# Patient Record
Sex: Female | Born: 1950 | Race: Black or African American | Hispanic: No | Marital: Married | Smoking: Current every day smoker
Health system: Southern US, Community
[De-identification: ages and names within clinical notes are randomized; demographics above are authoritative.]

## PROBLEM LIST (undated history)

## (undated) DIAGNOSIS — M1990 Unspecified osteoarthritis, unspecified site: Secondary | ICD-10-CM

## (undated) DIAGNOSIS — Z720 Tobacco use: Secondary | ICD-10-CM

## (undated) DIAGNOSIS — IMO0002 Reserved for concepts with insufficient information to code with codable children: Secondary | ICD-10-CM

## (undated) DIAGNOSIS — F419 Anxiety disorder, unspecified: Secondary | ICD-10-CM

## (undated) DIAGNOSIS — D573 Sickle-cell trait: Secondary | ICD-10-CM

## (undated) DIAGNOSIS — T7840XA Allergy, unspecified, initial encounter: Secondary | ICD-10-CM

## (undated) DIAGNOSIS — I639 Cerebral infarction, unspecified: Secondary | ICD-10-CM

## (undated) DIAGNOSIS — I2510 Atherosclerotic heart disease of native coronary artery without angina pectoris: Secondary | ICD-10-CM

## (undated) DIAGNOSIS — M810 Age-related osteoporosis without current pathological fracture: Secondary | ICD-10-CM

## (undated) DIAGNOSIS — I219 Acute myocardial infarction, unspecified: Secondary | ICD-10-CM

## (undated) DIAGNOSIS — I10 Essential (primary) hypertension: Secondary | ICD-10-CM

## (undated) DIAGNOSIS — E785 Hyperlipidemia, unspecified: Secondary | ICD-10-CM

## (undated) HISTORY — DX: Hyperlipidemia, unspecified: E78.5

## (undated) HISTORY — DX: Cerebral infarction, unspecified: I63.9

## (undated) HISTORY — PX: POLYPECTOMY: SHX149

## (undated) HISTORY — DX: Atherosclerotic heart disease of native coronary artery without angina pectoris: I25.10

## (undated) HISTORY — DX: Age-related osteoporosis without current pathological fracture: M81.0

## (undated) HISTORY — DX: Essential (primary) hypertension: I10

## (undated) HISTORY — DX: Acute myocardial infarction, unspecified: I21.9

## (undated) HISTORY — DX: Tobacco use: Z72.0

## (undated) HISTORY — PX: DILATION AND CURETTAGE OF UTERUS: SHX78

## (undated) HISTORY — DX: Unspecified osteoarthritis, unspecified site: M19.90

## (undated) HISTORY — DX: Sickle-cell trait: D57.3

## (undated) HISTORY — DX: Allergy, unspecified, initial encounter: T78.40XA

## (undated) HISTORY — DX: Anxiety disorder, unspecified: F41.9

## (undated) HISTORY — DX: Reserved for concepts with insufficient information to code with codable children: IMO0002

---

## 1998-09-23 HISTORY — PX: FOOT SURGERY: SHX648

## 1998-09-30 ENCOUNTER — Emergency Department (HOSPITAL_COMMUNITY): Admission: EM | Admit: 1998-09-30 | Discharge: 1998-09-30 | Payer: Self-pay

## 1999-11-13 ENCOUNTER — Encounter: Payer: Self-pay | Admitting: Emergency Medicine

## 1999-11-13 ENCOUNTER — Inpatient Hospital Stay (HOSPITAL_COMMUNITY): Admission: EM | Admit: 1999-11-13 | Discharge: 1999-11-16 | Payer: Self-pay | Admitting: Emergency Medicine

## 2000-02-10 ENCOUNTER — Inpatient Hospital Stay (HOSPITAL_COMMUNITY): Admission: EM | Admit: 2000-02-10 | Discharge: 2000-02-12 | Payer: Self-pay | Admitting: Emergency Medicine

## 2000-02-10 ENCOUNTER — Encounter: Payer: Self-pay | Admitting: Emergency Medicine

## 2000-07-22 ENCOUNTER — Emergency Department (HOSPITAL_COMMUNITY): Admission: EM | Admit: 2000-07-22 | Discharge: 2000-07-22 | Payer: Self-pay | Admitting: Emergency Medicine

## 2000-07-22 ENCOUNTER — Encounter: Payer: Self-pay | Admitting: Emergency Medicine

## 2000-07-23 ENCOUNTER — Inpatient Hospital Stay (HOSPITAL_COMMUNITY): Admission: EM | Admit: 2000-07-23 | Discharge: 2000-07-26 | Payer: Self-pay | Admitting: *Deleted

## 2000-07-23 ENCOUNTER — Encounter: Payer: Self-pay | Admitting: *Deleted

## 2000-07-24 ENCOUNTER — Encounter: Payer: Self-pay | Admitting: *Deleted

## 2000-08-05 ENCOUNTER — Ambulatory Visit (HOSPITAL_COMMUNITY): Admission: RE | Admit: 2000-08-05 | Discharge: 2000-08-05 | Payer: Self-pay | Admitting: Urology

## 2004-09-23 HISTORY — PX: ANGIOPLASTY: SHX39

## 2005-05-21 ENCOUNTER — Ambulatory Visit: Payer: Self-pay | Admitting: *Deleted

## 2005-05-21 ENCOUNTER — Inpatient Hospital Stay (HOSPITAL_COMMUNITY): Admission: EM | Admit: 2005-05-21 | Discharge: 2005-05-27 | Payer: Self-pay | Admitting: Emergency Medicine

## 2005-06-07 ENCOUNTER — Ambulatory Visit: Payer: Self-pay | Admitting: Cardiology

## 2005-07-02 ENCOUNTER — Ambulatory Visit: Payer: Self-pay | Admitting: Cardiology

## 2005-07-11 ENCOUNTER — Encounter (HOSPITAL_COMMUNITY): Admission: RE | Admit: 2005-07-11 | Discharge: 2005-10-09 | Payer: Self-pay | Admitting: Cardiology

## 2005-07-19 ENCOUNTER — Ambulatory Visit: Payer: Self-pay | Admitting: Cardiology

## 2005-09-27 ENCOUNTER — Ambulatory Visit: Payer: Self-pay | Admitting: Cardiology

## 2005-10-10 ENCOUNTER — Encounter (HOSPITAL_COMMUNITY): Admission: RE | Admit: 2005-10-10 | Discharge: 2006-01-08 | Payer: Self-pay | Admitting: Cardiology

## 2006-09-23 HISTORY — PX: LITHOTRIPSY: SUR834

## 2007-03-30 ENCOUNTER — Ambulatory Visit: Payer: Self-pay | Admitting: Cardiology

## 2007-04-06 ENCOUNTER — Ambulatory Visit: Payer: Self-pay | Admitting: Internal Medicine

## 2007-04-06 LAB — CONVERTED CEMR LAB
ALT: 13 units/L (ref 0–35)
AST: 19 units/L (ref 0–37)
Albumin: 3.5 g/dL (ref 3.5–5.2)
Alkaline Phosphatase: 95 units/L (ref 39–117)
BUN: 9 mg/dL (ref 6–23)
Basophils Absolute: 0.1 10*3/uL (ref 0.0–0.1)
Basophils Relative: 0.6 % (ref 0.0–1.0)
Bilirubin, Direct: 0.1 mg/dL (ref 0.0–0.3)
CO2: 29 meq/L (ref 19–32)
Calcium: 9.2 mg/dL (ref 8.4–10.5)
Chloride: 105 meq/L (ref 96–112)
Creatinine, Ser: 0.8 mg/dL (ref 0.4–1.2)
Eosinophils Absolute: 0.1 10*3/uL (ref 0.0–0.6)
Eosinophils Relative: 0.8 % (ref 0.0–5.0)
GFR calc Af Amer: 95 mL/min
GFR calc non Af Amer: 79 mL/min
Glucose, Bld: 107 mg/dL — ABNORMAL HIGH (ref 70–99)
HCT: 41.2 % (ref 36.0–46.0)
Hemoglobin: 14.5 g/dL (ref 12.0–15.0)
Lymphocytes Relative: 35 % (ref 12.0–46.0)
MCHC: 35.1 g/dL (ref 30.0–36.0)
MCV: 87.4 fL (ref 78.0–100.0)
Monocytes Absolute: 0.5 10*3/uL (ref 0.2–0.7)
Monocytes Relative: 5.3 % (ref 3.0–11.0)
Neutro Abs: 5.6 10*3/uL (ref 1.4–7.7)
Neutrophils Relative %: 58.3 % (ref 43.0–77.0)
Platelets: 177 10*3/uL (ref 150–400)
Potassium: 4.1 meq/L (ref 3.5–5.1)
RBC: 4.72 M/uL (ref 3.87–5.11)
RDW: 13.4 % (ref 11.5–14.6)
Sodium: 140 meq/L (ref 135–145)
TSH: 2 microintl units/mL (ref 0.35–5.50)
Total Bilirubin: 0.6 mg/dL (ref 0.3–1.2)
Total Protein: 6.5 g/dL (ref 6.0–8.3)
WBC: 10.1 10*3/uL (ref 4.5–10.5)

## 2008-06-21 ENCOUNTER — Ambulatory Visit: Payer: Self-pay | Admitting: Cardiology

## 2009-05-16 ENCOUNTER — Encounter (INDEPENDENT_AMBULATORY_CARE_PROVIDER_SITE_OTHER): Payer: Self-pay | Admitting: *Deleted

## 2009-12-22 DIAGNOSIS — Z720 Tobacco use: Secondary | ICD-10-CM

## 2009-12-22 HISTORY — DX: Tobacco use: Z72.0

## 2010-01-16 ENCOUNTER — Encounter: Payer: Self-pay | Admitting: Cardiology

## 2010-01-17 ENCOUNTER — Ambulatory Visit: Payer: Self-pay | Admitting: Cardiology

## 2010-01-19 ENCOUNTER — Encounter: Payer: Self-pay | Admitting: Cardiology

## 2010-01-19 LAB — CONVERTED CEMR LAB
Cholesterol: 238 mg/dL — ABNORMAL HIGH (ref 0–200)
Direct LDL: 164.8 mg/dL
HDL: 57.3 mg/dL (ref >39.00)
Total CHOL/HDL Ratio: 4
Triglycerides: 102 mg/dL (ref 0.0–149.0)
VLDL: 20.4 mg/dL (ref 0.0–40.0)

## 2010-07-04 ENCOUNTER — Encounter: Payer: Self-pay | Admitting: Cardiology

## 2010-10-23 NOTE — Miscellaneous (Signed)
  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   (01/16/2010 15:38)       Past History:  Past Medical History:  CORONARY ARTERY DISEASE (ICD-414.00)  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

## 2010-10-23 NOTE — Assessment & Plan Note (Signed)
Summary: yearly f/u /sl   Visit Type:  Follow-up Primary Provider:  Oliver Barre, MD  CC:  CAD.  History of Present Illness: The patient is seen for followup of coronary artery disease.  She is not having any chest pain or shortness of breath.  She does continue to smoke 2 or 3 cigarettes per day.  I counseled her to try to stop.  She goes about full activities.  She has not had any syncope or presyncope.  Current Medications (verified): 1)  Metoprolol Tartrate 25 Mg Tabs (Metoprolol Tartrate) .... Take One Tablet By Mouth Twice A Day 2)  Lipitor 80 Mg Tabs (Atorvastatin Calcium) .... Take One Tablet By Mouth Daily. 3)  Aspirin 81 Mg Tbec (Aspirin) .... Take One Tablet By Mouth Two Times A Day 4)  Nitrolingual 0.4 Mg/spray Soln (Nitroglycerin) .... One Spray Under Tongue Every 5 Minutes As Needed For Chest Pain---May Repeat Times Three  Allergies: 1)  ! * Soy 2)  ! * Tequin 3)  ! 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  All of the systems are reviewed and are negative.  Vital Signs:  Patient profile:   60 year old female Height:      63 inches Weight:      124 pounds BMI:     22.05 Pulse rate:   78 / minute BP sitting:   170 / 90  (left arm) Cuff size:   regular  Vitals Entered By: Stanton Kidney, EMT-P (January 17, 2010 8:55 AM)  Physical Exam  General:  patient is stable. Head:  head is atraumatic. Eyes:   no xanthelasma. Neck:  no jugular venous distention.  No carotid bruits. Chest Wall:  no chest wall tenderness. Lungs:  lungs are clear.  Respiratory effort is not labored. Heart:  cardiac exam reveals muscle and S2.  There are no clicks or significant murmurs. Abdomen:  abdomen is soft. Msk:  no musculoskeletal deformities. Extremities:  no peripheral edema. Skin:  no skin rashes. Psych:  patient is oriented to person time and place.  Affect is normal.   Impression & Recommendations:  Problem # 1:  CORONARY ARTERY DISEASE (ICD-414.00)  Her updated medication list for this problem includes:    Metoprolol Tartrate 25 Mg Tabs (Metoprolol tartrate) .Marland Kitchen... Take one tablet by mouth twice a day    Aspirin 81 Mg Tbec (Aspirin) .Marland Kitchen... Take one tablet by mouth two times a day    Nitrolingual 0.4 Mg/spray Soln (Nitroglycerin) ..... One spray under tongue every 5 minutes as needed for chest pain---may repeat times three Coronary disease is stable.  When I see her next we will consider followup stress testing.  There has been no testing since 2006. EKG is done today and reviewed by me.  There is no significant change.  Problem # 2:  HYPERTENSION (ICD-401.9) Blood pressure is elevated today.  I've chosen not to change her medicines.  She will be making an appointment to see her primary physician soon.  She'll need further blood pressure checks and  then decisions about medicine changes.  Problem # 3:  HYPERLIPIDEMIA (ICD-272.4)  Her updated medication list for this problem includes:    Lipitor 80 Mg Tabs (Atorvastatin calcium) .Marland Kitchen... Take one tablet by mouth daily.  Orders: TLB-Lipid Panel ([id number]) The patient will have fasting lipid profile today.  Patient Instructions: 1)  Your physician recommends that you schedule a follow-up appointment in: 1 year 2)  Your physician recommends that you return for a FASTING lipid profile: today Prescriptions: LIPITOR 80 MG TABS (ATORVASTATIN CALCIUM)  Take one tablet by mouth daily.  #90 x 3   Entered by:   Ledon Snare, RN   Authorized by:   Talitha Givens, MD, [facility]   Signed by:   Talitha Givens, MD, [facility] on 01/17/2010   Method used:   Historical   RxID:   [id number] METOPROLOL TARTRATE 25 MG TABS (METOPROLOL TARTRATE) Take one tablet by mouth twice a day  #180 x 3   Entered by:   Ledon Snare, RN   Authorized by:   Talitha Givens, MD, [facility]   Signed by:   Talitha Givens, MD, [facility] on 01/17/2010   Method used:   Historical   RxID:   [id number] LIPITOR 80 MG TABS (ATORVASTATIN CALCIUM) Take one tablet by mouth daily.  #90 x 3   Entered by:   Ledon Snare, RN   Authorized by:   Talitha Givens, MD, [facility]   Signed by:   Ledon Snare, RN on 01/17/2010   Method used:   Historical   RxID:   [id number] METOPROLOL TARTRATE 25 MG TABS (METOPROLOL TARTRATE) Take one tablet by mouth twice a day  #180 x 3   Entered by:   Ledon Snare, RN   Authorized by:   Talitha Givens, MD, [facility]   Signed by:   Ledon Snare, RN on 01/17/2010   Method used:   Historical   RxID:   [id number]

## 2010-10-23 NOTE — Letter (Signed)
Summary: Custom - Lipid  [facility], Main Office  [address]   [place], [place] [address]   Phone: [phone]  Fax: [phone]     January 19, 2010 MRN: [id number]   [facility] [address] [place], [place]  [address]   Dear Ms. Campanelli,  We have reviewed your cholesterol results.  They are as follows:     Total Cholesterol:    238 (Desirable: less than 200)       HDL  Cholesterol:     57.30  (Desirable: greater than 40 for men and 50 for women)       LDL Cholesterol:  164.2  (Desirable: less than 100 for low risk and less than 70 for moderate to high risk)       Triglycerides:       102.0  (Desirable: less than 150)  Our recommendations include:  Looks ok, continue current medications and watch diet closely   Call our office at the number listed above if you have any questions.  Lowering your LDL cholesterol is important, but it is only one of a large number of "risk factors" that may indicate that you are at risk for heart disease, stroke or other complications of hardening of the arteries.  Other risk factors include:   A.  Cigarette Smoking* B.  High Blood Pressure* C.  Obesity* D.   Low HDL Cholesterol (see yours above)* E.   Diabetes Mellitus (higher risk if your is uncontrolled) F.  Family history of premature heart disease G.  Previous history of stroke or cardiovascular disease    *These are risk factors YOU HAVE CONTROL OVER.  For more information, visit .  There is now evidence that lowering the TOTAL CHOLESTEROL AND LDL CHOLESTEROL can reduce the risk of heart disease.  The American Heart Association recommends the following guidelines for the treatment of elevated cholesterol:  1.  If there is now current heart disease and less than two risk factors, TOTAL CHOLESTEROL should be less than 200 and LDL CHOLESTEROL should be less than 100. 2.  If there is current heart disease or two or more risk factors, TOTAL CHOLESTEROL should be  less than 200 and LDL CHOLESTEROL should be less than 70.  A diet low in cholesterol, saturated fat, and calories is the cornerstone of treatment for elevated cholesterol.  Cessation of smoking and exercise are also important in the management of elevated cholesterol and preventing vascular disease.  Studies have shown that 30 to 60 minutes of physical activity most days can help lower blood pressure, lower cholesterol, and keep your weight at a healthy level.  Drug therapy is used when cholesterol levels do not respond to therapeutic lifestyle changes (smoking cessation, diet, and exercise) and remains unacceptably high.  If medication is started, it is important to have you levels checked periodically to evaluate the need for further treatment options.  Thank you,    [employer] Team

## 2010-10-23 NOTE — Letter (Signed)
Summary: Lipid reminder  [facility], Main Office  [address]   [place], [place] [address]   Phone: [phone]  Fax: [phone]        July 04, 2010 MRN: [id number]    [facility] [address] [place], [place]  [address]    Dear Ms. Fricker,  Our records indicate it is time to check your cholesterol.  Please call our office to schedule an appt for labwork.  Please remember it is a fasting lab.     Sincerely,  Meredith Staggers, RN Willa Rough, MD  This letter has been electronically signed by your physician.

## 2011-02-05 NOTE — Assessment & Plan Note (Signed)
[facility]                            CARDIOLOGY OFFICE NOTE   Elizabeth, [name]                       MRN:          [id number]  DATE:03/30/2007                            DOB:          Sep 12, 1951    Ms. Elizabeth [name] is here for followup.  She will let me know as soon as she  has decided who her ongoing primary MD is.  She has known coronary  disease.  In September 2006, she had a Taxus stent to the posterior  descending and then a staged procedure with a mini-Vision stent to the  LAD.  At that time, she had a groin hematoma and a lower abdominal  hematoma that stabilized and she has done very well.  She is still on  Plavix and it will be stopped today.  She is not having any chest pain.  She is going about full activities.  She has no complaints.   She is anxious here today and her blood pressure is elevated.  I suspect  anxiety is a part of this, but she needs followup blood pressure checks.   ALLERGIES:  No known drug allergies.   MEDICATIONS:  Metoprolol, Lipitor, Plavix (to be stopped today) and  aspirin.   PAST MEDICAL HISTORY:  See the list below.   REVIEW OF SYSTEMS:  She feels well and is doing well.  Review of systems  otherwise is negative.   PHYSICAL EXAMINATION:  VITAL SIGNS:  Weight 128 pounds, blood pressure  155/93 and she needs followup blood pressure checks.  Her pulse is 87.  GENERAL:  The patient is oriented to person, time and place.  Affect is  normal, although she is a little anxious today.  HEENT:  No xanthelasma.  She has normal extraocular motion.  There are  no carotid bruits.  There is no jugular venous distention.  LUNGS:  Clear.  Respiratory effort is not labored.  CARDIAC:  S1, S2.  There are no clicks or significant murmurs.  ABDOMEN:  Soft.  She has normal bowel sounds.  EXTREMITIES:  She has no significant peripheral edema.   EKG reveals sinus rhythm.  There is question of right atrial  abnormality.  The  patient is stable.   PROBLEM LIST:  1. Status post inferior myocardial infarction with Taxus stent to the      right and a bare metal stent to the left anterior descending.  She      is stable.  I will stop her Plavix.  I need to see her back in 1      year and we will consider exercise testing at that point.  2. Some history of continued tobacco use and she is strongly      encouraged to stop.  3. Hyperlipidemia on Lipitor.  4. Question of hypertension.  This has not been an issue in the past,      but she needs followup blood pressure checks.  5. Question of right atrial abnormality.  When I see her in the      future, will consider echocardiogram to look at her right  and left      heart.     Elizabeth Abed, MD, [facility]  Electronically Signed    JDK/MedQ  DD: 03/30/2007  DT: 03/30/2007  Job #: [id number]

## 2011-02-05 NOTE — Assessment & Plan Note (Signed)
[facility]                            CARDIOLOGY OFFICE NOTE   MIRCA, YALE                       MRN:          [id number]  DATE:06/21/2008                            DOB:          1950-12-12    Ms. Joshi is here for the followup of her coronary artery disease.  We  stopped her Plavix last year and she has done well.  She had a Taxus  stent to the posterior descending and then a staged procedure with a  MiniVision stent to the LAD in 2006.  She is not having any recurrent  chest pain.  She is not having any shortness of breath.  She had had a  groin hematoma and a lower abdominal hematoma in 2006.  These stabilized  and she has had no recurrence.  She has not had any syncope or  presyncope.  She is going about full activities.   PAST MEDICAL HISTORY:   ALLERGIES:  No known drug allergies.   MEDICATIONS:  Metoprolol, Lipitor, and aspirin.   OTHER MEDICAL PROBLEMS:  See the list on my note of March 30, 2007.   REVIEW OF SYSTEMS:  She does not have any significant GI or GU symptoms.  Otherwise, her review of systems is negative.   PHYSICAL EXAMINATION:  VITAL SIGNS:  Blood pressure is 138/97 with a  pulse of 64.  Her weight is stable at 127 pounds.  GENERAL:  The patient is oriented to person, time, and place.  Affect is  normal.  HEENT:  Reveals no xanthelasma.  She has normal extraocular motion.  NECK:  There are no carotid bruits.  There is no jugular venous  distention.  LUNGS:  Clear.  Respiratory effort is nonlabored.  CARDIAC:  Reveals an S1 with an S2.  There are no clicks or significant  murmurs.  ABDOMEN:  Soft.  EXTREMITIES:  She has no significant peripheral edema.   PROBLEMS INCLUDE:  1. History of an inferior myocardial infarction in 2006 with a Taxus      stent at that time to the right coronary artery and a bare-metal      stent to the left anterior descending coronary artery.  She is      doing well.  We need to  check her cholesterol again.  2. History of cigarette smoking.  She has cut down significantly and      insists that she smokes only 1 or 2 cigarettes a day.  She hopes to      stop completely, but she has not yet.  3. Hyperlipidemia.  We will check fasting lipid profile.  4. History of hypertension.  Her blood pressure is stable today and      there is no need to change any of her medicines.   She is stable and I will see her back in 1 year.     Luis Abed, MD, [facility]  Electronically Signed    JDK/MedQ  DD: 06/21/2008  DT: 06/22/2008  Job #: [id number]

## 2011-02-08 NOTE — Cardiovascular Report (Signed)
[place]  Patient:    Elizabeth [name], Elizabeth [name]                       MRN: [id number] Proc. Date: 02/11/00 Adm. Date:  [id number] Disc. Date: [id number] Attending:  Talitha Givens CC:         Veneda Melter, M.D. [facility]             Luis Abed, M.D. [facility]             [employer] Chestine Spore, M.D.                        Cardiac Catheterization  PROCEDURES PERFORMED: 1. Left heart catheterization. 2. Left ventriculogram. 3. Selective coronary angiography. 4. Percutaneous transluminal coronary angioplasty and stenting of the mid    right coronary artery.  DIAGNOSES: 1. Three-vessel coronary artery disease. 2. Normal left ventricular systolic function. 3. Mitral regurgitation 2+.  INDICATIONS:  [name] is a 60 year old black female who presents with substernal chest discomfort.  The patient has a history of coronary artery disease and suffered a lateral wall myocardial infarction in February of 2001 at which time she underwent angioplasty to an occluded left circumflex artery. The patient was noted to have residual disease in the LAD and right coronary artery that was moderate and she was medically managed.  She presents now with recurrence of chest discomfort.  The patient was admitted to the hospital where she was subsequently stabilized medically.  She is brought to the catheterization lab now for left heart catheterization.  TECHNIQUE:  After informed consent was obtained, the patient was brought to the cardiac catheterization lab where both groins were sterilely prepped and draped.  Lidocaine 1% was used to infiltrate the right groin, and a 6 French sheath placed into the right femoral artery using the modified Seldinger technique.  The 6 [place] and JR4 catheters were then used to engage the left and right coronary arteries, and selective angiography performed in various projections using manual injections of contrast.  A 6 French pigtail catheter  was then advanced to the left ventricle and the left ventriculogram was performed using power injections of contrast.  FINDINGS:  Initial findings are as follows: 1. Left main trunk:  Mild irregularities. 2. Left anterior descending:  This is a medium caliber vessel that provides    two diagonal branches in its proximal segment.  The LAD has mild diffuse    disease in the proximal mid section with narrowings of 30%.  There is a    discrete narrowing of 70% in the distal LAD after the second diagonal    branch.  The first diagonal branch is a trivial vessel with mild    irregularities.  Second diagonal branch is a larger vessel that    bifurcates in the mid section and exhibits mild diffuse disease. 3. Left circumflex artery:  This is a medium caliber vessel that provides    three marginal branches.  The AV circumflex has narrowings of 30-40%    in the proximal mid section at the takeoff of the first marginal branch.    The first marginal branch has an ostial narrowing of 50%.  The second    marginal branch has a focal narrowing of 70% in the mid section.  The    third marginal branch is a small caliber vessel with mild irregularities. 4. Right coronary artery:  The right coronary artery  is dominant.  This is a    large caliber vessel that provides the posterior descending artery as well    as three posterior ventricular branches in its terminal segment.  The    right coronary artery has mild diffuse disease in the proximal section.    The mid section has moderate disease with a high-grade hazy narrowing of    95% in the mid section at the takeoff of the second marginal branch.  The    distal right coronary artery has mild diffuse disease with narrowing of    20%.  The posterior descending artery is a large vessel that has a  mild    narrowing of 30% in the proximal mid section.  The first posterior    ventricular branch has a focal narrowing of 70% in the proximal    segment.  The  remainder of the distal RCA has mild irregularities.  LEFT VENTRICULOGRAM:  Normal end-systolic and end-diastolic dimensions. Overall left ventricular function is well-preserved.  Ejection fraction was 70-80%.  Mitral regurgitation is noted due to ventricular ectopy.  However, there still appears to be residual mitral regurgitation with normal beat which suggest some mild left atrial dilatation.  These findings were reviewed with the patient and we elected to proceed with percutaneous intervention of the right coronary artery.  The 6 French sheath in the right femoral artery was exchanged over a wire for a 7 [place] sheath. The patient had previously been on Integrilin and she was given heparin on a weight-adjusted basis and 300 mg of Plavix p.o.  A 7 [place] guide catheter was then used to engage the right coronary artery and a 0.014 inch Hi-Torque Floppy wire advanced in the distal RCA.  A 3.0 x 20 mm Maverick balloon was then used to predilate the lesion at 6 atmospheres for 30 seconds.  Repeat angiography showed significant improvement in the vessel lumen at the site of severe stenosis.  However, there is residual severe diffuse disease.  A 4.0 x 24 mm AVE S670 was then carefully positioned and deployed in the mid RCA at 6 atmospheres for 45 seconds.  A 4.0 x 9 mm Quantum Ranger balloon was then introduced and used to postdilate the stent.  Three inflations were performed at 12 atmospheres for 30 seconds.  Repeat angiography showed an excellent result with no residual narrowing within the area of stenting.  There was a mild area of vessel disruption and residual disease just distal to the stent. It is unclear whether this represented residual disease or vessel disruption due to stent deployment and angioplasty.  The Quantum Ranger was inflated in the mid RCA just distal to the stent at 8 atmospheres for 30 seconds.  Repeat angiography showed no vessel compromise.  However, there was  some persistent  defect in this region.  We elected to place a second stent.  A 4.0 x 9 mm AVE S670 was then deployed in the mid RCA just distal to the previously placed stent at 6 atmospheres for 30 seconds.  The Quantum Ranger balloon was then reintroduced and used to postdilate the stent at 12 atmospheres for 30 seconds and a repeat inflation was performed at the anastomosis with a previous stent at 14 atmospheres for 30 seconds.  Repeat angiography was then performed showing an excellent result with no residual narrowing and TIMI-3 flow throughout the right coronary artery.  Final angiography was then performed in various projections showing no distal vessel damage and TIMI-3 flow throughout  the RCA.  The guide catheter was then removed and sheath secured into position.  The patient tolerated the procedure well and was transferred to the floor in stable condition.  FINAL RESULTS:  Successful percutaneous transluminal coronary angioplasty and stenting of the mid right coronary artery with reduction of a 95% hazy narrowing to 0% with placement of a 4.0 x 24 mm AVE stent followed by a 4.0 x 9 mm AVE S670 stent.  ASSESSMENT AND PLAN:  Ms. Chinn is a 60 year old female with multivessel coronary artery disease.  At this point, the culprit lesion has been treated. Further assessment of the left coronary artery may be performed with a stress test and percutaneous intervention considering if ischemia is demonstrated. In addition, an echocardiogram may be used to determine if there is any residual mitral regurgitation.  Hopefully this will represent ischemic mitral regurgitation and will improve. DD:  02/11/00 TD:  02/14/00 Job: [id number]

## 2011-02-08 NOTE — Discharge Summary (Signed)
[place]  Patient:    Elizabeth [name], Elizabeth [name]                       MRN: [id number] Adm. Date:  [id number] Disc. Date: 02/12/00 Attending:  Talitha Givens Dictator:   Abelino Derrick, P.A.C. [facility]                           Discharge Summary  DISCHARGE DIAGNOSES: 1. Coronary disease status post percutaneous transluminal coronary angioplasty    stent of the right coronary artery this admission. 2. Residual 70% circumflex, 70% left anterior descending, with negative    Cardiolite prior to discharge. 3. Hyperlipidemia.  HOSPITAL COURSE:  Patient is a 60 year old female who has had previous circumflex angioplasty in February 2001.  She presented 02/10/00 with exertional chest pain for two days.  EKG showed normal sinus rhythm without acute changes.  Initial CK, MB, and troponins were negative.  She was admitted to telemetry, started on Integrilin and Lovenox, and set up for catheterization.  This was done 02/11/00 by Dr. Chales Abrahams which revealed a 95% stenosis in the RCA, 70% PDA, 70% circumflex, 70% LAD, with an EF of 70%.  She underwent stenting to the RCA.  She was kept on Integrilin post procedure. She had a Cardiolite scan done prior to discharge which was normal with no ischemia and normal LV function.  She is to follow up with Dr. Myrtis Ser and Dian Queen on 02/29/00.  DISCHARGE MEDICATIONS: 1. Plavix 75 mg a day for four weeks. 2. Coated aspirin q.d. 3. Lipitor 20 mg a day. 4. Lopressor 25 mg twice a day. 5. Nitroglycerin sublingual p.r.n.  LABORATORY DATA:  Iron level was 109, TIBC 249.  Sodium 139, potassium 3.8, BUN 13, creatinine 0.5.  White count 10.4, hemoglobin 12.7, hematocrit 36.1, INR 1.1.  CK, MB, and troponins were negative.  CHEST X-RAY:  Question chronic obstructive pulmonary disease.  No acute changes.  EKG:  Normal sinus rhythm without acute changes.  DISPOSITION:  Patient is discharged in stable condition and will follow up with Dr.  Myrtis Ser and Dian Queen on Friday, June 8. DD:  02/12/00 TD:  02/12/00 Job: [id number]

## 2011-02-08 NOTE — H&P (Signed)
[name], [name] NO.:  [id number]   MEDICAL RECORD NO.:  [id number]          PATIENT TYPE:  INP   LOCATION:  2931                         FACILITY:  [facility]   PHYSICIAN:  Vida Roller, M.D.   DATE OF BIRTH:  09/23/1951   DATE OF ADMISSION:  05/21/2005  DATE OF DISCHARGE:                                HISTORY & PHYSICAL   PRIMARY CARE PHYSICIAN:  Dr. Chestine Spore in [place].   CARDIOLOGIST:  Dr. Jerral Bonito, although she has not seen him in 3 years.   HISTORY OF PRESENT ILLNESS:  Mrs. Elizabeth [name] is a 60 year old woman with known  coronary artery disease, status post percutaneous revascularization of her  right coronary artery in 2001.  She now presents with acute onset of  substernal burning discomfort associated with nausea, vomiting, diaphoresis,  shortness of breath, and occurred acutely while she was lying in bed about  10 o'clock this evening and she immediately dialed 911 and the paramedics  arrived.  She got a 12-lead EKG in the field which showed ST segment  elevation, myocardial infarction in the inferior leads and she was taken  emergently to [facility], where she received aspirin and heparin.  The pain continued and we elected to take her emergently to the cardiac  catheterization laboratory.  She is still having active discomfort.   PAST MEDICAL HISTORY:  1.  Her past medical history is significant for coronary artery disease.      She is status post catheterization in May of 2005 which showed      significant three-vessel coronary artery disease with preserved LV      systolic function.  It was felt that her right coronary artery was the      most severely diseased and she got a PCI of that with a balloon,      initially had a reasonable angiographic result, but subsequently had      another episode of discomfort in May of 2001 and she received 2 bare-      metal stents to her right coronary artery with excellent angiographic      result.  2.  She has a history of hypertension and a history of hyperlipidemia.  3.  She has ongoing tobacco abuse and she has been noncompliant with her      medications.  4.  She is also allergic to shrimp.  5.  She also has a history of kidney stones; she is status post removal of      those a few years ago.  6.  She has a history of urinary tract infections.   ALLERGIES:  She is allergic to SHRIMP, SOY and TEQUIN, all of which give her  nausea and vomiting.   PAST SURGICAL HISTORY:  She has history of surgery on her foot.   FAMILY HISTORY:  Her mother and father both have significant coronary  disease; I believe her mother died.   REVIEW OF SYSTEMS:  Her review of systems was generally negative.   PHYSICAL EXAMINATION:  GENERAL:  She is a well-developed, well-nourished  [place] female who is  complaining of discomfort.  VITAL SIGNS:  Pulse was 58, respirations were 18, her blood pressure was  90/60.  She was afebrile.  HEENT:  Examination was unremarkable.  NECK:  Supple.  There is about 2 cm of JVP at 30 degrees.  Her thyroid is  normal size, in the midline.  Trachea is in the midline.  Carotids have  normal upstrokes with no obvious bruits.  CARDIOVASCULAR:  Regular.  She does have an S4, but no S3 and there is no  obvious murmur.  LUNGS:  Clear to auscultation bilaterally.  ABDOMEN:  Soft and nontender with normoactive bowel sounds.  No  hepatosplenomegaly.  EXTREMITIES:  The lower extremities were without clubbing, cyanosis, or  edema.  Pulses were 1+.  There were no obvious bruits.  NEUROLOGIC:  Exam was grossly nonfocal.  MUSCULOSKELETAL:  Exam was unremarkable.  BREASTS:  Deferred.  RECTAL:  Deferred.  GU:  Deferred.   LABORATORY DATA:  Her point-of-care labs show a sodium of 138, potassium  3.0, chloride 107, bicarb 24, BUN 5, glucose 117, hematocrit 38.  Her  creatinine was 0.4.  Her point-of-care enzymes are pending.   Her electrocardiogram shows sinus rhythm  at a rate of 57 with normal  intervals and a normal axis.  She has ST segment elevation in the inferior  leads, leads II, III and aVF, as well as extension through V3, V4, V5 and  V6.  There is ST segment depression in V1 and V2 with a prominent R wave in  V1.  There are small Q waves in II, III and aVF consistent with her previous  coronary disease.   RADIOLOGIC FINDINGS:  Her chest x-ray is pending.   ASSESSMENT:  1.  Acute ST-segment-elevation myocardial infarction, inferior, with lateral      extension.  Onset of discomfort appears to be about an hour and a half.      She has ongoing tobacco abuse.  She has medical noncompliance.  2.  She has a history of severe coronary disease.  3.  She has a history of hypertension.  4.  She has a history of hyperlipidemia.  5.  She has a history of kidney stones.   PLAN:  Our plan is to take her directly to the catheterization laboratory  for a diagnostic catheterization and percutaneous revascularization of the  acute artery.  Further therapy will be determined at the extent of her  coronary artery disease.  She has already received aspirin and heparin.  She  has refused all research protocols.      Vida Roller, M.D.  Electronically Signed     JH/MEDQ  D:  05/21/2005  T:  05/22/2005  Job:  [id number]

## 2011-02-08 NOTE — Cardiovascular Report (Signed)
NAMEKARIZMA, CHEEK NO.:  [id number]   MEDICAL RECORD NO.:  [id number]          PATIENT TYPE:  INP   LOCATION:  2931                         FACILITY:  [facility]   PHYSICIAN:  Charlies Constable, M.D. [facility] DATE OF BIRTH:  1951-01-02   DATE OF PROCEDURE:  05/22/2005  DATE OF DISCHARGE:                              CARDIAC CATHETERIZATION   HISTORY:  Mrs. Yousuf is 60 years old and has documented coronary disease  with previous stenting of the mid-right coronary in 2001. She had been lost  to follow-up over the last couple years. She presented tonight with severe  chest pain to the emergency room and EKG changes and an acute inferior  lateral infarction. She was seen by Dr. Dorethea Clan and transported promptly to  the catheterization lab for catheterization and possible intervention. She  was given heparin and aspirin in the emergency department. She has a history  of cigarette smoking and hyperlipidemia.   PROCEDURE NOTE:  The procedure was performed via the right femoral artery  using an arterial sheath and 6-French preformed coronary catheters. An  femoral wall arterial punch was performed and Omnipaque contrast was used. A  venous sheath was placed for access. After completion of the diagnostic  study we made a decision to proceed with intervention on the right coronary  artery. The patient had been given heparin in the emergency department and  her ACT was greater than 300 and we elected to use heparin alone, and we  gave her 600 mg of Plavix. There was a tight lesion in the ostium and  proximal portion of the posterior descending artery of the right coronary  artery. We used a 6-French JR-4 guiding catheter with side holes and a PT 2  light support wire. We crossed the lesion in the posterior descending branch  with the wire without difficulty. We dilated with a 2.25 x 20 mm Maverick  balloon performing two inflations up to 80 atmospheres for 30 seconds. We  then  attempted to deploy a 2.5 x 20 mm Taxus stent. We were unable to  advance the stent into the posterior descending branch. We then used a buddy  wire, placing the buddy wire in the AV branch but not selectively in the  posterior descending branch. With the help of the buddy wire we were able to  advance the stent into the posterior descending branch and we positioned the  proximal edge just at the ostium. We performed an initial inflation of 8  atmospheres for 25 seconds, and then we pulled the balloon back and  performed a second inflation up to 12 atmospheres for 30 seconds. We post  dilated with a 2.5 x 50 mm Quantum Maverick and performed one inflation in  the proximal and ostial portion of the stent up to 14 atmospheres for 30  seconds. Final diagnostic studies were then performed through the guiding  catheter. The patient tolerated the procedure well and left the laboratory  in satisfactory condition.   RESULTS:  The aortic pressure was 122/71 with mean of 92 and left  ventricular pressure of 122/17.   The  left main coronary artery was free of significant disease.   Left anterior descending artery gave rise to a large diagonal branch and  several septal perforators. The largest septal perforator came off the  diagonal branch. There was 90% stenosis in the mid LAD with a band right  after the stenosis. There was 70% stenosis in the large diagonal branch.   The circumflex artery gave rise to a small atrial branch, two small marginal  branches, a large marginal branch, and two posterolateral branches. There  was 70-80% stenosis in the ostium of the large marginal branch. There was  70% narrowing in the first posterolateral branch which was segmental.   The right side coronary was a large vessel that gave rise to a right  ventricular branch, a posterior descending branch, and three posterolateral  branches. There was 30% proximal narrowing. There was less than 10%  narrowing at the  stent in the mid vessel. There was 95% narrowing in the  proximal and ostial portion of the posterior descending branch with TIMI 2  flow distally. There was 70% narrowing in the first of the three  posterolateral branches.   The left ventriculogram performed in the RAO projection showed akinesis of  the mid inferior wall almost to the apex. The inferobasal area was spared.  The estimated ejection fraction was 50%.   Following stenting of the lesion in the proximal portion of posterior  descending branch of the right coronary artery, the stenosis improved from  95% to 0% and the flow improved from TIMI 3 to TIMI 3 flow. The AV branch  did not appear to be compromised.   The patient had the onset of chest pain at 2200 and arrived at [facility]  emergency room 2255. She arrived in the catheterization laboratory at 2348  and the first balloon inflation was performed at 0018 on May 22, 2005.  This gave a door-balloon time of 83 minutes and reperfusion time of 2 hours  18 minutes.   CONCLUSION:  1.  Acute diaphragmatic wall infarction with 90% stenosis in the mid left      anterior descending coronary artery, 70% stenosis in the large diagonal      branch of the left anterior descending coronary artery, 70-80% stenosis      in the ostium of the large marginal branch of the circumflex artery, 70%      narrowing in the posterolateral branch of the circumflex artery, less      than 10% narrowing at the stent site in the mid-right coronary artery,      and 95% stenosis in the proximal and ostial portion of the posterior      descending branch of the right coronary with TIMI 3 flow, and inferior      wall akinesis.  2.  Successful percutaneous coronary intervention of the posterior      descending branch of the right coronary using a Taxus drug-eluting stent      with improvement of narrowing from 95% to 0%  and improvement in flow      from TIMI 2 to TIMI 3 flow.  DISPOSITION:  The  patient returned to the post angioplasty unit for further  observation. Will plan intervention on the LAD later in the week, probably  on Friday.           ______________________________  Charlies Constable, M.D. [facility]     BB/MEDQ  D:  05/22/2005  T:  05/22/2005  Job:  [id number]  cc:   Margaretmary Bayley, M.D.  [address]  [place]  [place] [address]  Fax: [phone]   Willa Rough, M.D.  [address]  [address]  [place]  [place] [address]   Cardiopulmonary Lab

## 2011-02-08 NOTE — Cardiovascular Report (Signed)
NAMEAMONDA, Elizabeth [name]              ACCOUNT NO.:  [id number]   MEDICAL RECORD NO.:  [id number]          PATIENT TYPE:  INP   LOCATION:  2904                         FACILITY:  [facility]   PHYSICIAN:  Salvadore Farber, M.D. LHCDATE OF BIRTH:  July 17, 1951   DATE OF PROCEDURE:  05/24/2005  DATE OF DISCHARGE:                              CARDIAC CATHETERIZATION   PROCEDURE:  Bare metal stent to the mid left anterior descending.   INDICATIONS FOR PROCEDURE:  [name] is a 60 year old lady who presented  with inferior myocardial infarction on May 21, 2005.  Dr. Juanda Chance  emergently revascularized her PDA.  Clinical course has been uncomplicated  since.  She now returns to the laboratory for planned intervention on the  severe stenosis of the mid LAD.   DESCRIPTION OF PROCEDURE:  Informed consent was obtained.  Under 1%  lidocaine local anesthesia, a 6 French sheath was placed in the left common  femoral artery using the modified Seldinger technique.  Anticoagulation was  initiated with heparin and double bolused eptifibatide to achieve and  maintain an ACT of greater than 200 seconds.   ACLS 3.5 guide was advanced over a wire and engaged in the ostium of the  left main.  A Prowter wire was advanced without difficulty into the distal  LAD.  The lesion was pre-dilated using a 2.25 x 9 mm Maverick at six  atmospheres.  After the administration of intra-coronary nitroglycerin, the  vessel was no larger than 2.25 mm.  It was therefore stented using a 2 x 15  mm Minivision at 14 atmospheres.  This stent was then post-dilated using a  2.25 x 15 mm Quantum at 18 atmospheres.  Final angiography demonstrated no  residual stenosis, no dissection, and TIMI III flow to the distal  vasculature.  The patient tolerated the procedure well, and was transferred  to the holding room in stable condition.  The patient has a drug-eluding  stent in the PDA.  Therefore, Plavix should be continued for a minimum  of  one year.   COMPLICATIONS:  None.   IMPRESSION/RECOMMENDATIONS:  Successful Bare metal stent placement in the  mid left anterior descending.  We will continue with her current medical  therapy, including aspirin and Plavix.      Salvadore Farber, M.D. [facility]  Electronically Signed     WED/MEDQ  D:  05/24/2005  T:  05/25/2005  Job:  [id number]   cc:   Charlies Constable, M.D. [facility]  [address]  [address]  [place]  [place] [address]

## 2011-02-08 NOTE — Op Note (Signed)
[facility]  Patient:    Elizabeth [name], Elizabeth [name]                       MRN: [id number] Proc. Date: 07/24/00 Adm. Date:  [id number] Disc. Date: [id number] Attending:  Cathren Laine                           Operative Report  PREOPERATIVE DIAGNOSES: 1. Left hydronephrosis. 2. Left ureteral calculus 8 mm lower left ureter. 3. Urinary tract infection with pyelonephritis. 4. Myocardial infarction in February 2001. 5. Angioplasty with stent in May 2001, Dr. Myrtis Ser.  POSTOPERATIVE DIAGNOSES: 1. Left hydronephrosis. 2. Left ureteral calculus 8 mm lower left ureter. 3. Urinary tract infection with pyelonephritis. 4. Myocardial infarction in February 2001. 5. Angioplasty with stent in May 2001, Dr. Myrtis Ser.  OPERATION: 1. Cystoscopy. 2. Insertion of left ureteral stent and retrograde pyelogram.  SURGEON:  H. Danie Chandler., M.D.  DESCRIPTION OF PROCEDURE:  This 60 year old female with low-grade fever, pain in the left side of three to four days duration was brought to the operating room after receiving IV Cipro, and was prepped and draped in the lithotomy position.  Using the #22 cystourethroscope with 70 and 12 degree lenses the bladder and urethra were carefully inspected.  There was mild hyperemia and erythema throughout the bladder.  No stone or tumor was noted in the bladder.  The urethra did not seem to be strictured.  The right ureteral orifice was normal, but the left ureteral orifice was red and markedly edematous.  Using an #038 Glidewire and an open-ended catheter size six [place], the left ureter was intubated and purulent urine was aspirated from the left renal pelvis. Previous IVP studies had suggested an 8 mm calculus obstructing the left ureter some 3-4 cm above the left ureterovesical junction.  The Glidewire was replaced through the open-ended catheter and over the Glidewire was passed a #6 Kwart-type double-J ureteral stent which was noted to be in  the renal pelvis and upper calyx on fluoroscopy.  The distal end was noted to have a good curl in the bladder and the patients pull string was taped to the labia. She was returned to the recovery area in a stable condition.  DISPOSITION:  The plan is to treat this patient with Cipro 500 b.i.d. pending culture reports and drain her kidney until her urinary tract infection is controlled.  We will bring her back and extract her stone at a later session. DD:  07/24/00 TD:  07/24/00 Job: [id number]

## 2011-02-08 NOTE — Op Note (Signed)
[facility]  Patient:    Elizabeth [name], Elizabeth [name]                       MRN: [id number] Proc. Date: 08/05/00 Adm. Date:  [id number] Attending:  Tania Ade CC:         Radene Knee., M.D.   Operative Report  PREOPERATIVE DIAGNOSIS:  Large obstructive left distal ureteral calculus and pyelonephrosis.  POSTOPERATIVE DIAGNOSIS:  Large obstructive left distal ureteral calculus and pyelonephrosis.  OPERATION:  Cystourethroscopy, left ureteroscopy with holmium laser lithotripsy and basket fragment extraction, placement of JJ stent.  SURGEON:  Lucrezia Starch. Ovidio Hanger, M.D.  ANESTHESIA:  General endotracheal anesthesia.  ESTIMATED BLOOD LOSS: Negligible.  TUBES:  26 cm 6-French Surgitek double pigtail stent.  COMPLICATIONS:  None.  INDICATIONS FOR PROCEDURE:  Ms. Corral is a very nice 60 year old white female who presented to Dr. Retia Passe with a large obstructive left distal ureteral calculus approximately 9 mm in diameter, and pyelonephrosis.  She subsequently underwent placement of JJ stent, has been maintained on culture-specific antibiotic therapy and now presents for definitive therapy of the stones.  She understands risks and alternatives.  She has been cleared for surgery by cardiology and has elected to proceed accordingly.  PROCEDURE IN DETAIL:  The patient was placed in the supine position.  After proper general endotracheal anesthesia, she was placed in the dorsolithotomy position and prepped and draped with Betadine in a sterile fashion.  A cystourethroscopy was performed with a 22.5 [place] ACMI panendoscope.  The stent was pulled in the ureter above the stone.  However, the string attached to the stent was easily visible, so the stent was pulled to the meatus.  Under fluoroscopic guidance, a 0.03 French Teflon coated guidewire was placed into the left renal pelvis, and the stent was removed.  The wire was secured,  and ureteroscopy was performed with the ACMI short, thin ureteroscope.  The stone was easily visualized.  Utilizing holmium laser, the stone was fragmented into multiple small fragments, and these were serially extracted visually with nitinol  basket and will be submitted for stone analysis.  Following all of the extraction procedure, reinspection of the entire lower two-thirds ureter was performed visually, and no perforations were noted to be present.  The ureteroscope was visually removed.  Under fluoroscopic guidance, a 26 cm 6-French Surgitek double pigtail stent was placed and noted to be in good position within the left renal pelvis and within the bladder.  The bladder was drained.  The panendoscope was removed, and the patient was taken to the recovery room in stable condition. DD:  08/05/00 TD:  08/05/00 Job: [id number]

## 2011-02-08 NOTE — Cardiovascular Report (Signed)
[place]. [facility]  Patient:    Elizabeth [name], Elizabeth [name]                        MRN: [id number] Attending:  Everardo Beals. Juanda Chance, M.D. [facility] CC:         Rollene Rotunda, M.D. [facility]             Bruce R. Juanda Chance, M.D. [facility]             Cardiopulmonary Lab                        Cardiac Catheterization  INDICATIONS:  Ms. Blanda is 60 years old and had no prior history of known heart disease, although she is a smoker and has a positive family history for coronary heart disease.  She had the onset of chest pain this afternoon, and presented to the emergency room with EKG changes of an acute inferior infarction.  The diagnostic study was performed by Dr. Antoine Poche, which showed multivessel disease, but a total occlusion of a circumflex artery after a first marginal branch.  PROCEDURAL NOTE:  The procedure was performed by the right femoral artery using  arterial sheath and 7-French 3.5 Voda guiding catheter.  The patient had been given weight-adjusted heparin upon an ACT greater than 200 seconds, and was given ReoPro bolus and infusion.  We were able to cross the totally occluded circumflex blockage with a PT-Grafix  wire.  We went in with a 2.25 x 20 mm CrossSail balloon and performed two inflations of 7 and 8 atm for 49 and 43 seconds.  We then went in with a 2.5 x 50 mm CrossSail and performed three inflations up to 8 atm for 2.5 minutes. We elected not to stent the vessel because it was only a 2.5 vessel, and there as a marginal side branch we would have to stent across, which also had an ostial lesion.  A repeat diagnostic study was then performed through the guiding catheter.  The patient did become hypotensive and bradycardic after the first balloon inflation; had to be treated with atropine and dopamine -- but stabilized after a period of time.  RESULTS:  Initially the circumflex was completely occluded after a first marginal branch.  Following the PTCA the  stenosis improved at 30%, and the TIMI-3 flow improved from TIMI-0 to TIMI-3 flow.  The circumflex artery consisted of a second marginal branch and a smaller AV branch.  The second marginal branch had a 70% stenosis in its mid portion.  There was also an ostial percent narrowing in the  first marginal branch of approximately 70%, which initially was about 50% prior to the balloon dilatations.  The patient had the onset of chest pain at 3:30 p.m., and the first balloon inflation was at 1822 h.  CONCLUSIONS: 1. Acute diaphragmatic wall myocardial infarction, with multivessel disease --    documented by Dr. Antoine Poche.  Total occlusion of a circumflex artery with    posterior wall hypokinesis, but overall good LV function. 2. Successful PTCA of the circumflex artery, with improvement in the percent    luminal narrowing from 100% to 30%, and improving the flow from TIMI-0 to    TIMI-3 flow.  DISPOSITION:  The patient is transferred to the Unit for further observation. ill plan exercise testing later to see if the patient will require further revascularization.  RECOMMENDATIONS: DD:  11/13/99 TD:  11/14/99 Job: [id number]

## 2011-02-08 NOTE — H&P (Signed)
[facility]  Patient:    Elizabeth [name], Elizabeth [name]                       MRN: [id number] Adm. Date:  [id number] Attending:  Dalbert Mayotte CC:         Luis Abed, M.D. [facility]   History and Physical  HISTORY OF PRESENT ILLNESS:  This patient, age 60, was first seen in our office on July 23, 2000 with a history of rather sudden onset of left sided pain July 22, 2000. The patient went to the [facility] Emergency Room where she had a CT scan that suggested the possibility of a stone in the left lower ureter. She was also found to have pyuria and bacteriuria. A culture was obtained and she was started on Tequin 400 mg every 24 hours and this she thinks caused some nausea. She had urgency but no true dysuria and has not seen anything in the urine grossly. She gets up 0-1 times a night and voids every 1-2 hours. She has no prior history of stones or urinary tract infections.  ALLERGIES:  None except to shrimp and soy and possibly nausea from the Tequin.  MEDICATIONS:  From Dr. Myrtis Ser; nitroglycerin 0.4 mg p.r.n. She has not had to use this since May. Lipitor 20 mg daily, metoprolol 50 mg 1/2 b.i.d., Tylox p.r.n. for pain, aspirin 1 daily, Tequin 400 mg every 24 hours.  PAST MEDICAL HISTORY:  Reveals that the patient has had a myocardial infarction. She has had hypertension and had an angioplasty in January or February of 2001 which did not correct her problem and in May of 2001 had to have a stent inserted by Dr. Myrtis Ser.  Since then she has had no further angina. Her other medical problems include some foot surgery.  REVIEW OF SYSTEMS:  Weight has been stable about 135. HEENT:  She has had bad teeth and her teeth have been removed and replaced by dentures. CARDIORESPIRATORY:  She has had angina and last used nitroglycerin in May 2001. Denies shortness of breath, cough, ______ and she had a little swelling of her feet.  GI:  No peptic ulcer disease, no  hepatitis. BONES/JOINTS/MUSCLES:  No arthritis. NEUROPSYCHIATRIC: Denies any stroke, fainting, falling out spells.  OB/GYN:  No children and only one pregnancy and one miscarriage. Periods have ceased.  FAMILY HISTORY:  Father died age 67 of myocardial infarction. Mother died age 2 cause unknown but may have been a heart problem. There was a grandmother that had diabetes. She has 2 brothers dead with myocardial infarction and one brother dead with cirrhosis and 1 sister has a bad heart and 1 sister has cancer. There is hypertension, heart disease, stones and diabetes in the family but no bleeders.  SOCIAL HISTORY:  The patients married and does housework. She quit smoking in January of February of 2001 and does not abuse alcohol.  PHYSICAL EXAMINATION:  GENERAL:  Reveals a well-developed, well-nourished, 60 year old female.  VITAL SIGNS:  Temperature 97, blood pressure 121/72, pulse 88.  HEENT:  The ears and tympanic membranes are unremarkable. Eyes react normally to light and accommodation. Extraocular movements intact. Pharynx benign. Teeth have been removed and replaced by dentures. Tongue is dry.  NECK:  No enlargement of thyroid.  HEART:  Normal sinus rhythm. No murmur detected.  LUNGS:  Clear to auscultation and percussion.  BREASTS:  Not examined.  ABDOMEN:  There is tenderness in the left flank and CVA. No  masses palpable.  PELVIC:  Reveals a good rectal tone, no masses, bladder slightly tender. Uterus anterior, hard to feel.  EXTREMITIES:  No edema. Good peripheral pulses.  NEUROLOGIC:  Grossly normal reflexes and sensation.  IMPRESSION: 1. Left hydronephrosis rule out ureteral stone. 2. Urinary tract infection, rule out pyelonephritis.  PLAN:  Admit for CBC, CMET, IVP, chest EKG, cysto, stent, possible stone extraction. DD:  07/23/00 TD:  07/23/00 Job: [id number]

## 2011-02-08 NOTE — Discharge Summary (Signed)
[place]. [facility]  Patient:    Elizabeth [name], Elizabeth [name]                       MRN: [id number] Adm. Date:  [id number] Disc. Date: [id number] Attending:  Talitha Givens Dictator:   Lavella Hammock, P.A. CC:         Lindell Spar. Chestine Spore, M.D.                           Discharge Summary  DATE OF BIRTH:  [birth year]  PROCEDURES: 1. Cardiac catheterization. 2. Coronary arteriogram. 3. Left ventriculogram. 4. Percutaneous transluminal coronary angioplasty of one vessel.  HOSPITAL COURSE:  [name] is a 60 year old female who on the day of admission while hanging a picture suddenly developed anterior chest tightness that was associated with shortness of breath, diaphoresis and nausea.  She was brought to the emergency room and her pain was at a 7 to 10 out of 10.  She received nitroglycerin, aspirin, heparin and morphine. Her EKG had ST elevations in inferior leads consistent with inferior MI and she was taken urgently to the catheterization laboratory.  She had a cardiac catheterization which showed LAD with proximal 50%, mid 80% lesions as well as a diagonal with a mid 40% stenosis. The circumflex had a mid 100% lesion after the MOM.  The MOM had a 40% stenosis.  The RCA was large and dominant with a proximal 40 and mid 50% stenosis.  The PDA had a proximal 40% stenosis and the PL had a mid 99% stenosis but this is a small vessel.  Her ejection fraction was 60% with mild lateral hypokinesis.  She had PTCA of the circumflex which reduced the stenosis from 100% to 30% with TIMI III flow.  This resolved her pain and her EKG changes were also resolving so no further intervention was planned at this time.  She tolerated the procedure well and the sheath was removed without difficulty.  She had some problems with hypotension over night but receIVed iv fluids for these and tolerated it well.  During the cardiac catheterization she had hypotension as well as bradycardia  with a heart rate in the 30s and the systolic blood pressure in the 70s but this was treated with dopamine that was at renal dose and with IV fluids. This resolved within 24 hours.  The dopamine was weaned off and the IV fluids were weaned off as well and her blood pressure stayed within normal limits.  She had a cholesterol check that showed a total cholesterol of 215, triglycerides 53, HDL 44 and LDL of 160 so she was started on Lipitor at 20 mg a day. She stabilized well and was seen by cardiac rehab and was ambulatory without chest pain or difficulty.  She was educated regarding use of 911, use of nitroglycerin and exercise guide lines as well as smoking cessation.  Her hemoglobin dropped post procedure to 11.1 so she was started on multivitamin with iron.  By November 16, 1999 she was doing well. She was ambulating without difficulty maximal distances and was considered stable for discharge at that time.  She stated that she would like to participate in phase II cardiac rehab and she will be set up with this as an outpatient.  Chest x-ray: Heart size is upper normal for the AP technique and the lungs are clear.  EKG: Sinus rhythm with sinus arrhythmia and nonspecific T  wave abnormality.  LABORATORY DATA:  Hemoglobin 11.5, hematocrit 32.4, WBC 10.5, platelets 181. Sodium 140, potassium 3.8, chloride 106, carbon dioxide 28, BUN 12, creatinine 0.8, glucose 96, calcium 8.7.  CK MB #1 64/1.1, #2 1097/220, #3 1031/167, #4 386/30.  Total cholesterol 215. Triglycerides 53, HDL 44, LDL 160.  CONDITION ON DISCHARGE:  Improved.  CONSULTS:  None.  COMPLICATIONS:  None.  DISCHARGE DIAGNOSES:  1. Acute myocardial infarction, status post percutaneous transluminal     coronary angioplasty to the circumflex with residual disease in the left     anterior descending, right coronary artery and posterior lateral.  The     patient is to have an outpatient Cardiolite in six to eight weeks to      assess for ischemia in these areas.  2. Hyperlipidemia.  3. Post menopausal.  4. Family history of premature coronary artery disease.  5. History of tobacco use.  6. History of allergy to seafood.  7. Status post dilatation and curettage with stillborn child.  8. Status post foot surgery.  9. Anemia. 10. Possible history of degenerative joint disease/arthritis.  DISCHARGE INSTRUCTIONS: 1. She is to do no strenuous activity and no driving for seven days.  She    can return to work when cleared by M.D. 2. She is to stick to a low fat diet. 3. She is to call the office for problems with the catheterization site. 4. She has a follow up appointment with Dian Queen, P.A.-C. for Dr. Myrtis Ser    on November 26, 1999 at 9 a.m. 5. She is to follow up with Dr. Chestine Spore as needed.  DISCHARGE MEDICATIONS: 1. Coated aspirin 325 mg q.d. 2. Lopressor 50 mg 1/2 tab b.i.d. 3. Lipitor 20 mg q.d. 4. Multivitamin q.d. 5. Nitroglycerin 0.4 mg sublingual p.r.n. DD:  11/16/99 TD:  11/16/99 Job: [id number]

## 2011-02-08 NOTE — Cardiovascular Report (Signed)
[place]. [facility]  Patient:    Elizabeth [name], Elizabeth [name]                       MRN: [id number] Proc. Date: 11/13/99 Adm. Date:  [id number] Attending:  Talitha Givens CC:         Lindell Spar. Chestine Spore, M.D.                        Cardiac Catheterization  PROCEDURES PERFORMED:  Left heart catheterization/coronary arteriography.  INDICATIONS:  Evaluate patient with acute inferolateral myocardial infarction.  DESCRIPTION OF PROCEDURE:  Left heart catheterization was performed via the right femoral artery.  The artery was cannulated using anterior wall puncture.  A 6-French arterial sheath was inserted via the modified Seldinger technique. Preformed Judkins and a pigtail catheter were utilized.  The patient tolerated he procedure well.  RESULTS:  HEMODYNAMICS:  LV 97/27, AO 97/52.  CORONARY ARTERIES: 1. The left main was normal.  2. The left anterior descending artery had moderate diffuse disease.  There was  proximal 50% stenosis, followed by mid 80% stenosis after a mid diagonal.  The    mid diagonal had a 40% lesion.  3. The circumflex was occluded after the mid obtuse marginal.  The mid obtuse    marginal had 40% mid stenosis.  4. The right coronary artery was a very large dominant vessel.  There was diffuse    moderate disease throughout.  There was proximal 40% stenosis, mid 50% stenosis.    The PDA had proximal 40% stenosis.  A small posterolateral had mid 99% stenosis.  LEFT VENTRICULOGRAM:  The left ventriculogram was obtained in the RAO and LAO projections.  The ejection fraction was approximately 60%, with mild lateral hypokinesis.  CONCLUSION:  Severe three-vessel coronary artery disease.  PLAN:  The patient will have probable percutaneous revascularization of the circumflex vessel. DD:  11/13/99 TD:  11/14/99 Job: [id number]

## 2011-02-08 NOTE — Discharge Summary (Signed)
NAMETRUDE, CANSLER              ACCOUNT NO.:  [id number]   MEDICAL RECORD NO.:  [id number]          PATIENT TYPE:  INP   LOCATION:  2023                         FACILITY:  [facility]   PHYSICIAN:  [place] [name], M.D. [facility] BIRTH:  May 25, 1951   DATE OF ADMISSION:  05/21/2005  DATE OF DISCHARGE:  05/27/2005                                 DISCHARGE SUMMARY   PRIMARY CARE PHYSICIAN:  Dr. Chestine Spore.   PRIMARY CARDIOLOGIST:  Dr. Willa Rough.   PRINCIPAL DIAGNOSIS:  Inferior ST-elevation myocardial infarction/coronary  artery disease.   OTHER DIAGNOSES:  1.  Ongoing tobacco abuse.  2.  Hypertension.  3.  Hyperlipidemia.  4.  History of nephrolithiasis.   ALLERGIES:  Shrimp causes nausea and vomiting.   PROCEDURES:  Left heart cardiac catheterization, PCI and stenting of the RCA  with a drug-eluting stent and PCI and stenting the LAD with bare metal  stent.   HISTORY OF PRESENT ILLNESS:  A 60 year old African-American female with  prior history of CAD status post PCI and stenting of the upper RCA with two  bare metal stents in 2001 who has previously documented normal LV function.  She was in her usual state of health until May 21, 2005, when at  approximately 10 p.m., she had onset of nausea, vomiting and diaphoresis as  well as chest pain.  She presented to [facility] ED, where ECG showed ST  segment elevation in leads II, III and aVF with lateral extension in V3-V6  as well as reciprocal ST segment changes in V1 and V2.  She was subsequently  treated with beta blocker and heparin and taken emergently to the cardiac  cath lab.   HOSPITAL COURSE:  She underwent cardiac catheterization early in the morning  of August 30th, which revealed a 90% lesion in the proximal LAD, a 70%  lesion in the first diagonal, a 70-80% stenosis in the OM1 and a 70% lesion  in the OM2.  A previously stented area in the RCA had less than 10% stenosis  while the PDA appeared to be the acute  lesion at 95% with TIMI 2 flow.  The  PDA was successfully stented with a 2.5 x 20-mm Taxus drug-eluting stent.  She tolerated this procedure well and was maintained on heparin with a  planned first stage PCI of the LAD, which took place on September 1st with  successful placement of a 2.0 x 15-mm Mini Vision stent.  Following PCI of  the LAD, she developed an enlarged left groin and lower abdominal hematoma  without hemodynamic compromise.  Additional manual pressure was maintained.  She underwent spiral CT which was negative for retroperitoneal bleed.  She  has been seen by the cardiac rehab team and has been ambulating the hallways  without recurrent chest pain or discomfort or limitations.  She is being  discharged home today in satisfactory condition.   DISCHARGE LABORATORY:  Hemoglobin 11.7, hematocrit 33.4, WBC 9.6, platelets  189.  Sodium 139, potassium 3.8, chloride 109, CO2 25, BUN 8, creatinine  0.8, glucose 88.  Peak CK 933, peak MB 130.7, peak troponin-I  39.12.  Total  cholesterol 242, triglycerides 94, HDL 47, LDL 176.  Calcium 8.8.  TSH  0.911.  Hemoglobin A1c 5.2.   DISPOSITION:  The patient is being discharged home in good condition.   FOLLOW-UP APPOINTMENTS:  The patient has followup with the physician's  assistant in lieu of Dr. Myrtis Ser at [facility] on June 07, 2005,  at 12 noon.  She is also asked to follow up with her primary care physician,  Dr. Chestine Spore, in 1-2 weeks.   DISCHARGE MEDICATIONS:  1.  Plavix 75 mg daily.  2.  Aspirin 325 mg daily.  3.  Lopressor 25 mg b.i.d.  4.  Lipitor 80 mg q.h.s.  5.  Nitroglycerin 0.4 mg sublingual p.r.n. chest pain.   OUTSTANDING LABORATORY STUDIES:  None.   DURATION OF DISCHARGE ENCOUNTER:  Forty-five minutes, including physician  time.      Ok Anis, NP      Elizabeth [name], M.D. [facility]  Electronically Signed    CRB/MEDQ  D:  05/27/2005  T:  05/27/2005  Job:  [id number]   cc:   Willa Rough,  M.D.  [address]  [address]  [place]  [place] [address]   Margaretmary Bayley, M.D.  [address], [address]  [place]  [place] [address]  Fax: [phone]

## 2011-02-08 NOTE — Discharge Summary (Signed)
[facility]  Patient:    JAMESON, MORROW                       MRN: [id number] Adm. Date:  [id number] Disc. Date: [id number] Attending:  Dalbert Mayotte CC:         Lucrezia Starch Ovidio Hanger, M.D.  Luis Abed, M.D. [facility]   Discharge Summary  HISTORY:  This patient, age 60, was initially seen in our office on July 23, 2000, with a history of left-sided pain, starting on July 22, 2000. The patient had no prior history of urinary tract infections or stones and proceeded in the emergency room at [facility], where a CT scan suggested the possibility of a left lower ureteral calculus with some hydronephrosis on the left.  She was also found to have pyuria and bacteriuria.  A culture was obtained and she was started on Tequin and referred to our office for further treatment and evaluation.  The culture did grow Escherichia coli which was sensitive to all antibiotics tested.  In our office, the patient was having nausea, continued pain, and therefore was admitted for further treatment and evaluation.  She was placed on Cipro 400 mg q.12h., given IV fluids, and Tylox for pain.  The patient had an intravenous pyelogram which confirmed that there was, indeed, and 8 mm calculus in the distal left ureter about 3 cm above the ureterovesical junction.  She had grade 1 hydroureteronephrosis above.  On July 24, 2000, a left ureteral stent was then inserted; cloudy urine was obtained from the left kidney.  Urine culture was repeated, but showed no growth.  Of course, the patient had been on antibiotics for 48 hours.  The stent was left indwelling.  The patients residual urine was checked.  She voided frequently.  Her pain was better, but she continued to have some low grade fever for 24-48 hours.  ALLERGIES:  The patient reports allergies to SHRIMP and SOY, and states that the [facility] gave her nausea.  MEDICATIONS:  Dr. Myrtis Ser has her on metoprolol 25 mg  b.i.d.; aspirin 1 daily which she was advised to stop; Lipitor 20 mg daily; and she has nitroglycerin 0.4 mg p.r.n. which she has not used since May.  PAST MEDICAL/SURGICAL HISTORY:  Reveals that she had a myocardial infarction in February 2001, at which time she had an angioplasty.  She continued to have angina, and in May 2001, had a stent inserted in her coronary artery by Dr. Myrtis Ser.  Since then, she has had no further angina.  She has had some foot surgery.  REVIEW OF SYSTEMS:  Reveals that she has had her teeth removed and replaced by dentures.  She has a little swelling of her feet occasionally.  She denies any chest pain or shortness of breath.  No asthma.  No GI symptoms.  She denies arthritis or stroke.  She has had no children, one pregnancy, one miscarriage. Her periods have ceased.  FAMILY/SOCIAL HISTORY:  Reveals that the father died of myocardial infarction at age 9.  Her mother died at age 27, cause unknown.  It may have been heart, however.  A grandmother had diabetes.  She had two brothers, who are dead, with MI.  One brother, who is dead, with cirrhosis.  One sister has a bad heart.  One sister has had cancer.  There is hypertension, heart disease, stones, and diabetes in the family, but, to her knowledge, no free bleeders. The  patient is married.  She is a housewife.  She quit smoking in February 2001.  She does not abuse alcohol.  PHYSICAL EXAMINATION:  GENERAL:  Revealed a well-developed, well-nourished 60 year old female with pain in the left side.  VITAL SIGNS:  Temperature 99.0-100.0, blood pressure 121/72, pulse 88.  HEENT:  Unremarkable, except for dentures and absence of her teeth.  Her tongue was dry, however, on admission.  LUNGS:  Clear.  CARDIAC:  No murmur detected.  BREASTS:  Not examined.  ABDOMEN:  Tenderness in the left flank.  No masses.  PELVIC:  Revealed good rectal tone, no masses, with bladder slightly tender. Uterus anterior, hard  to feel.  EXTREMITIES/NEUROLOGIC:  Unremarkable.  IMPRESSION:  1. Left hydronephrosis, secondary to 8 mm distal ureteral calculus.  2. Urinary tract infection and left pyelonephritis, secondary to     Escherichia coli.  3. Myocardial infarction, post angioplasty with stent in May 2001.  PLAN:  The patient will go home on Cipro 500 b.i.d.  We will give her Tylox for pain, Phenergan for nausea.  She is to have a regular diet, force fluids. Continue metoprolol 25 mg b.i.d. and Zocor 20 mg daily from Dr. Myrtis Ser.  She will see Dr. Earlene Plater in the office next week in my absence and he will schedule a left ureteroscopic stone extraction when her pyelonephritis has subsided.  DISCHARGE CONDITION:  Improved. DD:  07/25/00 TD:  07/25/00 Job: [id number]

## 2011-02-26 ENCOUNTER — Encounter: Payer: Self-pay | Admitting: Cardiology

## 2011-03-20 ENCOUNTER — Encounter: Payer: Self-pay | Admitting: Cardiology

## 2011-03-20 DIAGNOSIS — M1990 Unspecified osteoarthritis, unspecified site: Secondary | ICD-10-CM | POA: Insufficient documentation

## 2011-03-20 DIAGNOSIS — IMO0002 Reserved for concepts with insufficient information to code with codable children: Secondary | ICD-10-CM | POA: Insufficient documentation

## 2011-03-20 DIAGNOSIS — E782 Mixed hyperlipidemia: Secondary | ICD-10-CM | POA: Insufficient documentation

## 2011-03-20 DIAGNOSIS — I10 Essential (primary) hypertension: Secondary | ICD-10-CM | POA: Insufficient documentation

## 2011-03-20 DIAGNOSIS — I2510 Atherosclerotic heart disease of native coronary artery without angina pectoris: Secondary | ICD-10-CM | POA: Insufficient documentation

## 2011-03-21 ENCOUNTER — Encounter: Payer: Self-pay | Admitting: Cardiology

## 2011-03-21 ENCOUNTER — Ambulatory Visit (INDEPENDENT_AMBULATORY_CARE_PROVIDER_SITE_OTHER): Payer: 59 | Admitting: Cardiology

## 2011-03-21 DIAGNOSIS — I10 Essential (primary) hypertension: Secondary | ICD-10-CM

## 2011-03-21 DIAGNOSIS — I2510 Atherosclerotic heart disease of native coronary artery without angina pectoris: Secondary | ICD-10-CM

## 2011-03-21 DIAGNOSIS — E785 Hyperlipidemia, unspecified: Secondary | ICD-10-CM

## 2011-03-21 LAB — CBC WITH DIFFERENTIAL/PLATELET
Basophils Absolute: 0 10*3/uL (ref 0.0–0.1)
Eosinophils Relative: 0.7 % (ref 0.0–5.0)
HCT: 41.1 % (ref 36.0–46.0)
Lymphocytes Relative: 45.2 % (ref 12.0–46.0)
Lymphs Abs: 3.4 10*3/uL (ref 0.7–4.0)
Monocytes Relative: 7 % (ref 3.0–12.0)
Platelets: 143 10*3/uL — ABNORMAL LOW (ref 150.0–400.0)
RDW: 14.5 % (ref 11.5–14.6)
WBC: 7.5 10*3/uL (ref 4.5–10.5)

## 2011-03-21 LAB — BASIC METABOLIC PANEL
CO2: 26 mEq/L (ref 19–32)
GFR: 107.96 mL/min (ref >60.00)
Glucose, Bld: 107 mg/dL — ABNORMAL HIGH (ref 70–99)
Potassium: 4.2 mEq/L (ref 3.5–5.1)
Sodium: 137 mEq/L (ref 135–145)

## 2011-03-21 LAB — LIPID PANEL
HDL: 58.7 mg/dL (ref >39.00)
Total CHOL/HDL Ratio: 3
VLDL: 12.4 mg/dL (ref 0.0–40.0)

## 2011-03-21 LAB — TSH: TSH: 1.34 u[IU]/mL (ref 0.35–5.50)

## 2011-03-21 LAB — HEPATIC FUNCTION PANEL
AST: 19 U/L (ref 0–37)
Albumin: 3.9 g/dL (ref 3.5–5.2)
Alkaline Phosphatase: 97 U/L (ref 39–117)
Bilirubin, Direct: 0.1 mg/dL (ref 0.0–0.3)
Total Protein: 6.6 g/dL (ref 6.0–8.3)

## 2011-03-21 MED ORDER — NITROGLYCERIN 0.4 MG/SPRAY TL SOLN: 1.0000 | Status: DC | PRN

## 2011-03-21 MED ORDER — ENALAPRIL MALEATE 5 MG PO TABS: 5.0000 mg | ORAL_TABLET | Freq: Every day | ORAL | Status: DC

## 2011-03-21 MED ORDER — METOPROLOL TARTRATE 25 MG PO TABS: 25.0000 mg | ORAL_TABLET | Freq: Two times a day (BID) | ORAL | Status: DC

## 2011-03-21 MED ORDER — ATORVASTATIN CALCIUM 80 MG PO TABS: 80.0000 mg | ORAL_TABLET | Freq: Every day | ORAL | Status: DC

## 2011-03-21 NOTE — Assessment & Plan Note (Signed)
Coronary disease is stable.  She has not had an exercise test 2006.  We will consider this at the time of next visit.  She is on aspirin.  She is on high-dose Lipitor.  She is on a beta blocker.  First we need to control her blood pressure better and followup her lipids.

## 2011-03-21 NOTE — Patient Instructions (Signed)
Start Enalapril 5 mg daily  Labs today  Your physician has requested that you regularly monitor and record your blood pressure readings at home. Please use the same machine at the same time of day to check your readings and record them to bring to your follow-up visit.  Your physician recommends that you schedule a follow-up appointment in: with your primary care MD Dr Jonny Ruiz  Your physician recommends that you schedule a follow-up appointment in: 5-6 weeks with Dr Myrtis Ser

## 2011-03-21 NOTE — Progress Notes (Signed)
HPI The patient is seen for cardiology followup.  She has known coronary disease.  It is also clear that she has not seen her primary physician in a long time.  I am strongly encouraging her to followup with primary care.  She's not having any chest pain or shortness of breath.  She does have some headaches in the morning.  Her blood pressure is elevated today. No Known Allergies  Current Outpatient Prescriptions  Medication Sig Dispense Refill  . aspirin 81 MG EC tablet Take 81 mg by mouth 2 (two) times daily.        Marland Kitchen atorvastatin (LIPITOR) 80 MG tablet Take 80 mg by mouth daily.        . metoprolol tartrate (LOPRESSOR) 25 MG tablet Take 25 mg by mouth 2 (two) times daily.        . nitroGLYCERIN (NITROLINGUAL) 0.4 MG/SPRAY spray Place 1 spray under the tongue every 5 (five) minutes as needed. May repeat up to 3 doses.         History   Social History  . Marital Status: Married    Spouse Name: N/A    Number of Children: N/A  . Years of Education: N/A   Occupational History  . Not on file.   Social History Main Topics  . Smoking status: Current Everyday Smoker  . Smokeless tobacco: Not on file   Comment: Ongoing since 04/2005  . Alcohol Use: Not on file  . Drug Use: Not on file  . Sexually Active: Not on file   Other Topics Concern  . Not on file   Social History Narrative  . No narrative on file    Family History  Problem Relation Age of Onset  . Coronary artery disease Mother   . Coronary artery disease Father     Past Medical History  Diagnosis Date  . 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 Hypertension   . Hyperlipidemia   . Abrasion or friction burn of foot and toe(s), with infection   . Degenerative joint disease     Generalized  . Tobacco abuse April, 2011    Continued    Past Surgical History  Procedure Date  . Angioplasty 2006    x3  . Foot surgery 2000     ROS  Patient denies fever, chills, sweats, rash, change in vision, change in hearing, chest pain, cough, nausea vomiting, urinary symptoms.  All other systems are reviewed and are negative.  PHYSICAL EXAM Patient is stable today.  She is oriented to person time and place.  Affect is normal.  Head is atraumatic.  There is no xanthelasma.  There no carotid bruits.  There is no jugular venous distention.  Lungs are clear.  Respiratory effort is nonlabored.  Cardiac exam reveals an S1-S2.  No clicks or significant murmurs.  The abdomen is soft.  There is no peripheral edema.  There are no musculoskeletal deformities.  There are no skin rashes. Filed Vitals:   03/21/11 0936  BP: 179/100  Pulse: 76  Resp: 14  Height: 5\' 3"  (1.6 m)  Weight: 126 lb (57.153 kg)    EKG Is done today and reviewed by me.  It is normal.  ASSESSMENT & PLAN

## 2011-03-21 NOTE — Assessment & Plan Note (Signed)
She is on medication.  We do need followup fasting lipids.

## 2011-03-21 NOTE — Assessment & Plan Note (Signed)
I am concerned about her blood pressure.  Ultimately she will probably be placed on an ACE inhibitor.  However I will start with amlodipine for blood pressure control.

## 2011-03-22 ENCOUNTER — Encounter: Payer: Self-pay | Admitting: *Deleted

## 2011-03-29 ENCOUNTER — Encounter: Payer: Self-pay | Admitting: *Deleted

## 2011-03-31 ENCOUNTER — Encounter: Payer: Self-pay | Admitting: Internal Medicine

## 2011-03-31 DIAGNOSIS — Z0001 Encounter for general adult medical examination with abnormal findings: Secondary | ICD-10-CM | POA: Insufficient documentation

## 2011-03-31 DIAGNOSIS — Z0000 Encounter for general adult medical examination without abnormal findings: Secondary | ICD-10-CM | POA: Insufficient documentation

## 2011-04-02 ENCOUNTER — Ambulatory Visit (INDEPENDENT_AMBULATORY_CARE_PROVIDER_SITE_OTHER): Payer: 59 | Admitting: Internal Medicine

## 2011-04-02 ENCOUNTER — Encounter: Payer: Self-pay | Admitting: Internal Medicine

## 2011-04-02 VITALS — BP 152/88 | HR 77 | Temp 98.7°F | Ht 63.0 in | Wt 127.2 lb

## 2011-04-02 DIAGNOSIS — I10 Essential (primary) hypertension: Secondary | ICD-10-CM

## 2011-04-02 DIAGNOSIS — E785 Hyperlipidemia, unspecified: Secondary | ICD-10-CM

## 2011-04-02 DIAGNOSIS — Z23 Encounter for immunization: Secondary | ICD-10-CM

## 2011-04-02 DIAGNOSIS — I2510 Atherosclerotic heart disease of native coronary artery without angina pectoris: Secondary | ICD-10-CM

## 2011-04-02 DIAGNOSIS — Z0000 Encounter for general adult medical examination without abnormal findings: Secondary | ICD-10-CM

## 2011-04-02 DIAGNOSIS — F17200 Nicotine dependence, unspecified, uncomplicated: Secondary | ICD-10-CM

## 2011-04-02 DIAGNOSIS — Z720 Tobacco use: Secondary | ICD-10-CM

## 2011-04-02 DIAGNOSIS — F411 Generalized anxiety disorder: Secondary | ICD-10-CM

## 2011-04-02 DIAGNOSIS — F419 Anxiety disorder, unspecified: Secondary | ICD-10-CM | POA: Insufficient documentation

## 2011-04-02 MED ORDER — ESCITALOPRAM OXALATE 10 MG PO TABS: 10.0000 mg | ORAL_TABLET | Freq: Every day | ORAL | Status: DC

## 2011-04-02 MED ORDER — ENALAPRIL MALEATE 10 MG PO TABS: 10.0000 mg | ORAL_TABLET | Freq: Two times a day (BID) | ORAL | Status: DC

## 2011-04-02 MED ORDER — EZETIMIBE 10 MG PO TABS: 10.0000 mg | ORAL_TABLET | Freq: Every day | ORAL | Status: DC

## 2011-04-02 MED ORDER — TETANUS-DIPHTH-ACELL PERTUSSIS 5-2.5-18.5 LF-MCG/0.5 IM SUSP
0.5000 mL | Freq: Once | INTRAMUSCULAR | Status: AC
  Administered 2011-04-02: 0.5 mL via INTRAMUSCULAR

## 2011-04-02 NOTE — Patient Instructions (Addendum)
Please stop smoking (see information about the class coming up) Start the zetia 10 mg per day for cholesterol (your goal LDL should be less than 70) Start the Lexapro 10 mg per day for nerves Please increase the enalapril (vasotec) to 10 mg twice per day for Blood Pressure Continue all other medications as before Please keep your appointments with your specialists as you have planned - Dr Myrtis Ser on Aug 24 Please Have your Blood drawn at the Lab at [facility] OR at the [facility] at least 3-5 days before seeing Dr Chancy Hurter had the tetanus shot today You will be contacted regarding the referral for: colonoscopy Please remember to followup with your GYN for the yearly pap smear and/or mammogram Please return in 6 months, or sooner if needed

## 2011-04-02 NOTE — Assessment & Plan Note (Signed)
Moderate, chronic ongoing; willing to try lexapro 10 qd

## 2011-04-02 NOTE — Progress Notes (Signed)
Subjective:    Patient ID: Elizabeth [name], female    DOB: 29-Jan-1951, 60 y.o.   MRN: [id number]  HPI  Here as new pt;  Here for wellness and f/u;  Overall doing ok;  Pt denies CP, worsening SOB, DOE, wheezing, orthopnea, PND, worsening LE edema, palpitations, dizziness or syncope.  Pt denies neurological change such as new Headache, facial or extremity weakness.  Pt denies polydipsia, polyuria, or low sugar symptoms. Pt states overall good compliance with treatment and medications, good tolerability, and trying to follow lower cholesterol diet.  Pt denies worsening depressive symptoms, suicidal ideation, but has has chronic anxiety for many yrs, but is willing to consider tx, has near panic attacks on occasion, not worse recently.  No fever, wt loss, night sweats, loss of appetite, or other constitutional symptoms.  Pt states good ability with ADL's, low fall risk, home safety reviewed and adequate, no significant changes in hearing or vision, and occasionally active with exercise. Has been on high dose liptor since her last MI, most recent LDL April 2012 still 127.  Also recently on low dose enalapril but BP at home and here remain elevated. Past Medical History  Diagnosis Date  . 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 Hypertension   . Hyperlipidemia   . Abrasion or friction burn of foot and toe(s), with infection   . Degenerative joint disease     Generalized  . Tobacco abuse April, 2011    Continued   Past Surgical History  Procedure Date  . Angioplasty 2006    x3  . Foot surgery 2000    reports that she has been smoking.  She does not have any smokeless tobacco history on file. Her alcohol and drug histories not on file. family history includes Coronary artery disease in her father and mother. No Known Allergies Current Outpatient Prescriptions on File Prior to Visit  Medication Sig Dispense  Refill  . aspirin 81 MG EC tablet Take 81 mg by mouth 2 (two) times daily.        Marland Kitchen atorvastatin (LIPITOR) 80 MG tablet Take 1 tablet (80 mg total) by mouth daily.  30 tablet  6  . metoprolol tartrate (LOPRESSOR) 25 MG tablet Take 1 tablet (25 mg total) by mouth 2 (two) times daily.  60 tablet  6  . nitroGLYCERIN (NITROLINGUAL) 0.4 MG/SPRAY spray Place 1 spray under the tongue every 5 (five) minutes as needed. May repeat up to 3 doses.  12 g  3  . DISCONTD: enalapril (VASOTEC) 5 MG tablet Take 1 tablet (5 mg total) by mouth daily.  30 tablet  6   No current facility-administered medications on file prior to visit.   Review of Systems Review of Systems  Constitutional: Negative for diaphoresis, activity change, appetite change and unexpected weight change.  HENT: Negative for hearing loss, ear pain, facial swelling, mouth sores and neck stiffness.   Eyes: Negative for pain, redness and visual disturbance.  Respiratory: Negative for shortness of breath and wheezing.   Cardiovascular: Negative for chest pain and palpitations.  Gastrointestinal: Negative for diarrhea, blood in stool, abdominal distention and rectal pain.  Genitourinary: Negative for hematuria, flank pain and decreased urine volume.  Musculoskeletal: Negative for myalgias and joint swelling.  Skin: Negative for color change and wound.  Neurological: Negative for syncope and numbness.  Hematological: Negative for adenopathy.  Psychiatric/Behavioral: Negative for  hallucinations, self-injury, decreased concentration and agitation.      Objective:   Physical Exam Physical Exam  VS noted  - BP 152/88  Pulse 77  Temp(Src) 98.7 F (37.1 C) (Oral)  Ht 5\' 3"  (1.6 m)  Wt 127 lb 4 oz (57.72 kg)  BMI 22.54 kg/m2  SpO2 98% Constitutional: Pt is oriented to person, place, and time. Appears well-developed and well-nourished.  HENT:  Head: Normocephalic and atraumatic.  Right Ear: External ear normal.  Left Ear: External ear normal.   Nose: Nose normal.  Mouth/Throat: Oropharynx is clear and moist.  Eyes: Conjunctivae and EOM are normal. Pupils are equal, round, and reactive to light.  Neck: Normal range of motion. Neck supple. No JVD present. No tracheal deviation present.  Cardiovascular: Normal rate, regular rhythm, normal heart sounds and intact distal pulses.   Pulmonary/Chest: Effort normal and breath sounds normal.  Abdominal: Soft. Bowel sounds are normal. There is no tenderness.  Musculoskeletal: Normal range of motion. Exhibits no edema.  Lymphadenopathy:  Has no cervical adenopathy.  Neurological: Pt is alert and oriented to person, place, and time. Pt has normal reflexes. No cranial nerve deficit.  Skin: Skin is warm and dry. No rash noted.  Psychiatric:  Has  normal mood and affect. Behavior is normal. 2-3+ nervous        Assessment & Plan:

## 2011-04-02 NOTE — Assessment & Plan Note (Addendum)
Uncontrolled, pt ok with adding zetia 10 mg per day ,  to f/u any worsening symptoms or concerns, f/u labs 4 wks

## 2011-04-02 NOTE — Assessment & Plan Note (Addendum)
Overall doing well, age appropriate education and counseling updated, referrals for preventative services and immunizations addressed, dietary and smoking counseling addressed, most recent labs and ECG reviewed.  I have personally reviewed and have noted: 1) the patient's medical and social history 2) The pt's use of alcohol, tobacco, and illicit drugs 3) The patient's current medications and supplements 4) Functional ability including ADL's, fall risk, home safety risk, hearing and visual impairment 5) Diet and physical activities 6) Evidence for depression or mood disorder 7) The patient's height, weight, and BMI have been recorded in the chart I have made referrals, and provided counseling and education based on review of the above For tetanus and colonscopy as she is due

## 2011-04-02 NOTE — Assessment & Plan Note (Signed)
Urged to quit 

## 2011-04-02 NOTE — Assessment & Plan Note (Signed)
BP appears somewhat difficult to improve- to incr the enalapril to 10 bid; f/u labs next months

## 2011-04-02 NOTE — Assessment & Plan Note (Signed)
Has f/u appt with Dr katz/card - aug 24

## 2011-04-25 ENCOUNTER — Encounter: Payer: Self-pay | Admitting: Internal Medicine

## 2011-05-06 ENCOUNTER — Encounter: Payer: Self-pay | Admitting: Internal Medicine

## 2011-05-06 ENCOUNTER — Ambulatory Visit (AMBULATORY_SURGERY_CENTER): Payer: 59 | Admitting: *Deleted

## 2011-05-06 VITALS — Ht 63.0 in | Wt 126.0 lb

## 2011-05-06 DIAGNOSIS — Z1211 Encounter for screening for malignant neoplasm of colon: Secondary | ICD-10-CM

## 2011-05-06 MED ORDER — PEG-KCL-NACL-NASULF-NA ASC-C 100 G PO SOLR: ORAL | Status: DC

## 2011-05-14 ENCOUNTER — Other Ambulatory Visit (INDEPENDENT_AMBULATORY_CARE_PROVIDER_SITE_OTHER): Payer: 59

## 2011-05-14 DIAGNOSIS — Z0000 Encounter for general adult medical examination without abnormal findings: Secondary | ICD-10-CM

## 2011-05-14 LAB — BASIC METABOLIC PANEL
Chloride: 106 mEq/L (ref 96–112)
GFR: 115.37 mL/min (ref >60.00)
Glucose, Bld: 81 mg/dL (ref 70–99)
Potassium: 3.9 mEq/L (ref 3.5–5.1)
Sodium: 140 mEq/L (ref 135–145)

## 2011-05-14 LAB — CBC WITH DIFFERENTIAL/PLATELET
Basophils Absolute: 0 10*3/uL (ref 0.0–0.1)
HCT: 42.9 % (ref 36.0–46.0)
Lymphs Abs: 3.1 10*3/uL (ref 0.7–4.0)
MCV: 90.6 fl (ref 78.0–100.0)
Monocytes Absolute: 0.6 10*3/uL (ref 0.1–1.0)
Monocytes Relative: 8 % (ref 3.0–12.0)
Platelets: 137 10*3/uL — ABNORMAL LOW (ref 150.0–400.0)
RDW: 14.6 % (ref 11.5–14.6)

## 2011-05-14 LAB — HEPATIC FUNCTION PANEL
ALT: 17 U/L (ref 0–35)
AST: 22 U/L (ref 0–37)
Albumin: 3.9 g/dL (ref 3.5–5.2)
Alkaline Phosphatase: 93 U/L (ref 39–117)

## 2011-05-14 LAB — URINALYSIS, ROUTINE W REFLEX MICROSCOPIC
Bilirubin Urine: NEGATIVE
Hgb urine dipstick: NEGATIVE
Ketones, ur: NEGATIVE
Leukocytes, UA: NEGATIVE
Urobilinogen, UA: 0.2 (ref 0.0–1.0)

## 2011-05-14 LAB — TSH: TSH: 1.74 u[IU]/mL (ref 0.35–5.50)

## 2011-05-14 LAB — LIPID PANEL: VLDL: 11 mg/dL (ref 0.0–40.0)

## 2011-05-17 ENCOUNTER — Encounter: Payer: Self-pay | Admitting: Cardiology

## 2011-05-17 ENCOUNTER — Ambulatory Visit (INDEPENDENT_AMBULATORY_CARE_PROVIDER_SITE_OTHER): Payer: 59 | Admitting: Cardiology

## 2011-05-17 DIAGNOSIS — I10 Essential (primary) hypertension: Secondary | ICD-10-CM

## 2011-05-17 DIAGNOSIS — I2510 Atherosclerotic heart disease of native coronary artery without angina pectoris: Secondary | ICD-10-CM

## 2011-05-17 NOTE — Assessment & Plan Note (Signed)
Blood pressures controlled. No change in therapy. 

## 2011-05-17 NOTE — Assessment & Plan Note (Signed)
Coronary disease is stable.  Her last exercise test was in 2006.  She's had no symptoms.  She is scheduled for colonoscopy.  I have decided to wait to arrange an exercise test after I see her next.

## 2011-05-17 NOTE — Patient Instructions (Signed)
Your physician recommends that you schedule a follow-up appointment in: 6 months  

## 2011-05-17 NOTE — Progress Notes (Signed)
HPI Patient is seen today to follow blood pressure and coronary artery disease.  I saw her last March 21, 2011.  Since that time she seen her primary physician and her medications have been adjusted.  The blood pressure is under better control. Allergies  Allergen Reactions  . Adhesive (Tape)   . Shrimp (Shellfish Allergy)     Current Outpatient Prescriptions  Medication Sig Dispense Refill  . aspirin 81 MG EC tablet Take 81 mg by mouth 2 (two) times daily.        Marland Kitchen atorvastatin (LIPITOR) 80 MG tablet Take 1 tablet (80 mg total) by mouth daily.  30 tablet  6  . enalapril (VASOTEC) 10 MG tablet Take 1 tablet (10 mg total) by mouth 2 (two) times daily.  60 tablet  11  . escitalopram (LEXAPRO) 10 MG tablet Take 1 tablet (10 mg total) by mouth daily.  30 tablet  11  . ezetimibe (ZETIA) 10 MG tablet Take 1 tablet (10 mg total) by mouth daily.  90 tablet  3  . metoprolol tartrate (LOPRESSOR) 25 MG tablet Take 1 tablet (25 mg total) by mouth 2 (two) times daily.  60 tablet  6  . nitroGLYCERIN (NITROLINGUAL) 0.4 MG/SPRAY spray Place 1 spray under the tongue every 5 (five) minutes as needed. May repeat up to 3 doses.  12 g  3  . peg 3350 powder (MOVIPREP) 100 G SOLR MOVI PREP take as directed  1 kit  0    History   Social History  . Marital Status: Married    Spouse Name: N/A    Number of Children: N/A  . Years of Education: N/A   Occupational History  . Not on file.   Social History Main Topics  . Smoking status: Current Some Day Smoker    Types: Cigarettes  . Smokeless tobacco: Never Used   Comment: Ongoing since 04/2005  . Alcohol Use: No  . Drug Use: No  . Sexually Active: Not on file   Other Topics Concern  . Not on file   Social History Narrative  . No narrative on file    Family History  Problem Relation Age of Onset  . Coronary artery disease Mother   . Coronary artery disease Father     Past Medical History  Diagnosis Date  . 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 Hypertension   . Hyperlipidemia   . Abrasion or friction burn of foot and toe(s), with infection   . Degenerative joint disease     Generalized  . Tobacco abuse April, 2011    Continued  . Allergy     soy  . Anxiety   . Myocardial infarction   . Sickle cell trait     Past Surgical History  Procedure Date  . Angioplasty 2006    x3 stents  . Foot surgery 2000  . Dilation and curettage of uterus   . Lithotripsy 2008    left kidney stone and also ureteral stent placed    ROS  Patient denies fever, chills, headache, sweats, rash, change in vision, change in hearing, chest pain, cough, nausea vomiting, urinary symptoms.  All other systems are reviewed and are negative.  PHYSICAL EXAM Patient is oriented to person time and place.  Affect is normal.  Head is atraumatic.  Lungs are clear.  Respiratory effort is nonlabored.  There is no jugular  venous distention.  Cardiac exam reveals S1-S2.  There are no clicks or significant murmurs.  The abdomen is soft there is no peripheral edema. Filed Vitals:   05/17/11 0912  BP: 132/82  Pulse: 60  Height: 5\' 3"  (1.6 m)  Weight: 127 lb (57.607 kg)    EKG Is not done today.  ASSESSMENT & PLAN

## 2011-05-20 ENCOUNTER — Ambulatory Visit (AMBULATORY_SURGERY_CENTER): Payer: 59 | Admitting: Internal Medicine

## 2011-05-20 ENCOUNTER — Encounter: Payer: Self-pay | Admitting: Internal Medicine

## 2011-05-20 VITALS — BP 139/79 | HR 59 | Temp 96.8°F | Resp 20 | Ht 63.6 in | Wt 126.0 lb

## 2011-05-20 DIAGNOSIS — Z1211 Encounter for screening for malignant neoplasm of colon: Secondary | ICD-10-CM

## 2011-05-20 DIAGNOSIS — D126 Benign neoplasm of colon, unspecified: Secondary | ICD-10-CM

## 2011-05-20 HISTORY — PX: COLONOSCOPY: SHX174

## 2011-05-20 MED ORDER — SODIUM CHLORIDE 0.9 % IV SOLN: 500.0000 mL | INTRAVENOUS | Status: DC

## 2011-05-20 NOTE — Patient Instructions (Signed)
Follow your discharge instructions.  Continue your medications.  Await pathology results. 

## 2011-05-21 ENCOUNTER — Telehealth: Payer: Self-pay

## 2011-05-21 NOTE — Telephone Encounter (Signed)

## 2011-05-24 ENCOUNTER — Encounter: Payer: Self-pay | Admitting: Internal Medicine

## 2011-10-21 ENCOUNTER — Ambulatory Visit (INDEPENDENT_AMBULATORY_CARE_PROVIDER_SITE_OTHER): Payer: 59 | Admitting: Internal Medicine

## 2011-10-21 ENCOUNTER — Encounter: Payer: Self-pay | Admitting: Internal Medicine

## 2011-10-21 VITALS — BP 142/90 | HR 79 | Temp 97.0°F | Ht 63.0 in | Wt 131.0 lb

## 2011-10-21 DIAGNOSIS — Z0000 Encounter for general adult medical examination without abnormal findings: Secondary | ICD-10-CM

## 2011-10-21 DIAGNOSIS — F411 Generalized anxiety disorder: Secondary | ICD-10-CM

## 2011-10-21 DIAGNOSIS — Z2911 Encounter for prophylactic immunotherapy for respiratory syncytial virus (RSV): Secondary | ICD-10-CM

## 2011-10-21 DIAGNOSIS — E785 Hyperlipidemia, unspecified: Secondary | ICD-10-CM

## 2011-10-21 DIAGNOSIS — Z23 Encounter for immunization: Secondary | ICD-10-CM

## 2011-10-21 DIAGNOSIS — F419 Anxiety disorder, unspecified: Secondary | ICD-10-CM

## 2011-10-21 DIAGNOSIS — I10 Essential (primary) hypertension: Secondary | ICD-10-CM

## 2011-10-21 MED ORDER — ESCITALOPRAM OXALATE 10 MG PO TABS: 10.0000 mg | ORAL_TABLET | Freq: Every day | ORAL | Status: DC

## 2011-10-21 MED ORDER — EZETIMIBE 10 MG PO TABS: 10.0000 mg | ORAL_TABLET | Freq: Every day | ORAL | Status: DC

## 2011-10-21 NOTE — Progress Notes (Signed)
Subjective:    Patient ID: Elizabeth [name], female    DOB: 1951-07-04, 61 y.o.   MRN: [id number]  HPI Here to f/u; overall doing ok,  Pt denies chest pain, increased sob or doe, wheezing, orthopnea, PND, increased LE swelling, palpitations, dizziness or syncope.  Pt denies new neurological symptoms such as new headache, or facial or extremity weakness or numbness   Pt denies polydipsia, polyuria  Pt states overall good compliance with meds, trying to follow lower cholesterol diet, wt overall stable but little exercise however.  Denies worsening depressive symptoms, suicidal ideation, or panic, though has ongoing anxiety, not increased recently.  No other acute complaints.  Due for zostavax today.  Has seen Dr Myrtis Ser aug 2012, no changes.  For some reason, pharmacy needs refill of her lexapro and zetia Past Medical History  Diagnosis Date  . 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 Hypertension   . Hyperlipidemia   . Abrasion or friction burn of foot and toe(s), with infection   . Degenerative joint disease     Generalized  . Tobacco abuse April, 2011    Continued  . Allergy     soy  . Anxiety   . Myocardial infarction   . Sickle cell trait    Past Surgical History  Procedure Date  . Angioplasty 2006    x3 stents  . Foot surgery 2000  . Dilation and curettage of uterus   . Lithotripsy 2008    left kidney stone and also ureteral stent placed    reports that she has been smoking Cigarettes.  She has never used smokeless tobacco. She reports that she does not drink alcohol or use illicit drugs. family history includes Coronary artery disease in her father and mother. Allergies  Allergen Reactions  . Adhesive (Tape)   . Shrimp (Shellfish Allergy)   . Soy Allergy Nausea And Vomiting   Current Outpatient Prescriptions on File Prior to Visit  Medication Sig Dispense Refill  . aspirin 81 MG EC  tablet Take 81 mg by mouth 2 (two) times daily.        Marland Kitchen atorvastatin (LIPITOR) 80 MG tablet Take 1 tablet (80 mg total) by mouth daily.  30 tablet  6  . enalapril (VASOTEC) 10 MG tablet Take 1 tablet (10 mg total) by mouth 2 (two) times daily.  60 tablet  11  . metoprolol tartrate (LOPRESSOR) 25 MG tablet Take 1 tablet (25 mg total) by mouth 2 (two) times daily.  60 tablet  6  . nitroGLYCERIN (NITROLINGUAL) 0.4 MG/SPRAY spray Place 1 spray under the tongue every 5 (five) minutes as needed. May repeat up to 3 doses.  12 g  3   Review of Systems Review of Systems  Constitutional: Negative for diaphoresis and unexpected weight change.  HENT: Negative for drooling and tinnitus.   Eyes: Negative for photophobia and visual disturbance.  Respiratory: Negative for choking and stridor.   Gastrointestinal: Negative for vomiting and blood in stool.  Genitourinary: Negative for hematuria and decreased urine volume.    Objective:   Physical Exam BP 142/90  Pulse 79  Temp(Src) 97 F (36.1 C) (Oral)  Ht 5\' 3"  (1.6 m)  Wt 131 lb (59.421 kg)  BMI 23.21 kg/m2  SpO2 97% Physical Exam  VS noted Constitutional: Pt appears well-developed and well-nourished.  HENT: Head: Normocephalic.  Right Ear: External ear normal.  Left Ear: External ear normal.  Eyes: Conjunctivae and EOM are normal. Pupils are equal, round, and reactive to light.  Neck: Normal range of motion. Neck supple.  Cardiovascular: Normal rate and regular rhythm.   Pulmonary/Chest: Effort normal and breath sounds normal.  Abd:  Soft, NT, non-distended, + BS Neurological: Pt is alert. No cranial nerve deficit.  Skin: Skin is warm. No erythema.  Psychiatric: Pt behavior is normal. Thought content normal. 1+ nervous    Assessment & Plan:

## 2011-10-21 NOTE — Progress Notes (Signed)
Addended by: Scharlene Gloss B on: 10/21/2011 11:35 AM   Modules accepted: Orders

## 2011-10-21 NOTE — Assessment & Plan Note (Signed)
Not charged today, but pt willing to have the zostavax - will do

## 2011-10-21 NOTE — Assessment & Plan Note (Signed)
stable overall by hx and exam, most recent data reviewed with pt, and pt to continue medical treatment as before  Lab Results  Component Value Date   WBC 8.0 05/14/2011   HGB 14.3 05/14/2011   HCT 42.9 05/14/2011   PLT 137.0* 05/14/2011   GLUCOSE 81 05/14/2011   CHOL 132 05/14/2011   TRIG 55.0 05/14/2011   HDL 65.50 05/14/2011   LDLDIRECT 164.8 01/17/2010   LDLCALC 56 05/14/2011   ALT 17 05/14/2011   AST 22 05/14/2011   NA 140 05/14/2011   K 3.9 05/14/2011   CL 106 05/14/2011   CREATININE 0.7 05/14/2011   BUN 10 05/14/2011   CO2 27 05/14/2011   TSH 1.74 05/14/2011

## 2011-10-21 NOTE — Patient Instructions (Signed)
You had the zostavax (shingles shot) today Continue all other medications as before Your generic Lexapro and Zetia were sent to the pharmacy Please return in 6 mo with Lab testing done 3-5 days before

## 2011-10-21 NOTE — Assessment & Plan Note (Signed)
Mld elev today but overall stable overall by hx and exam, most recent data reviewed with pt, and pt to continue medical treatment as before BP Readings from Last 3 Encounters:  10/21/11 142/90  05/20/11 139/79  05/17/11 132/82

## 2011-10-21 NOTE — Assessment & Plan Note (Signed)
stable overall by hx and exam, most recent data reviewed with pt, and pt to continue medical treatment as before  Lab Results  Component Value Date   LDLCALC 56 05/14/2011

## 2012-03-21 ENCOUNTER — Other Ambulatory Visit: Payer: Self-pay | Admitting: Cardiology

## 2012-04-21 ENCOUNTER — Ambulatory Visit: Payer: 59 | Admitting: Internal Medicine

## 2012-05-06 ENCOUNTER — Ambulatory Visit (INDEPENDENT_AMBULATORY_CARE_PROVIDER_SITE_OTHER): Payer: 59 | Admitting: Internal Medicine

## 2012-05-06 ENCOUNTER — Encounter: Payer: Self-pay | Admitting: Internal Medicine

## 2012-05-06 VITALS — BP 158/92 | HR 70 | Temp 98.0°F | Ht 63.0 in | Wt 125.5 lb

## 2012-05-06 DIAGNOSIS — I10 Essential (primary) hypertension: Secondary | ICD-10-CM

## 2012-05-06 DIAGNOSIS — F419 Anxiety disorder, unspecified: Secondary | ICD-10-CM

## 2012-05-06 DIAGNOSIS — Z0000 Encounter for general adult medical examination without abnormal findings: Secondary | ICD-10-CM

## 2012-05-06 DIAGNOSIS — F411 Generalized anxiety disorder: Secondary | ICD-10-CM

## 2012-05-06 DIAGNOSIS — E785 Hyperlipidemia, unspecified: Secondary | ICD-10-CM

## 2012-05-06 MED ORDER — METOPROLOL TARTRATE 25 MG PO TABS: 25.0000 mg | ORAL_TABLET | Freq: Two times a day (BID) | ORAL | Status: DC

## 2012-05-06 MED ORDER — EZETIMIBE 10 MG PO TABS: 10.0000 mg | ORAL_TABLET | Freq: Every day | ORAL | Status: DC

## 2012-05-06 MED ORDER — NITROGLYCERIN 0.4 MG/SPRAY TL SOLN: 1.0000 | Status: DC | PRN

## 2012-05-06 MED ORDER — ATORVASTATIN CALCIUM 80 MG PO TABS: 80.0000 mg | ORAL_TABLET | Freq: Every day | ORAL | Status: DC

## 2012-05-06 MED ORDER — ESCITALOPRAM OXALATE 10 MG PO TABS: 10.0000 mg | ORAL_TABLET | Freq: Every day | ORAL | Status: DC

## 2012-05-06 MED ORDER — IRBESARTAN 150 MG PO TABS: 150.0000 mg | ORAL_TABLET | Freq: Every day | ORAL | Status: DC

## 2012-05-06 NOTE — Progress Notes (Signed)
Subjective:    Patient ID: Elizabeth [name], female    DOB: April 19, 1951, 61 y.o.   MRN: [id number]  HPI  Here for wellness and f/u;  Overall doing ok;  Pt denies CP, worsening SOB, DOE, wheezing, orthopnea, PND, worsening LE edema, palpitations, dizziness or syncope.  Pt denies neurological change such as new Headache, facial or extremity weakness.  Pt denies polydipsia, polyuria, or low sugar symptoms. Pt states overall good compliance with treatment and medications, good tolerability, and trying to follow lower cholesterol diet.  Pt denies worsening depressive symptoms, suicidal ideation or panic. No fever, wt loss, night sweats, loss of appetite, or other constitutional symptoms.  Pt states good ability with ADL's, low fall risk, home safety reviewed and adequate, no significant changes in hearing or vision, and occasionally active with exercise.  Has  Been under much stress with husband recently dx with bipolar, has lost several lbs form 131 to current 125 from jan 2013. Denies worsening depressive symptoms, suicidal ideation, or panic - Lexapro working ok , asks for refills.  Has not been checking BP at home but BP mild elev last visit as well. Doe s c/o chronic right tinnitus and "sizzling" sounds, none on the left. Past Medical History  Diagnosis Date  . 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 Hypertension   . Hyperlipidemia   . Abrasion or friction burn of foot and toe(s), with infection   . Degenerative joint disease     Generalized  . Tobacco abuse April, 2011    Continued  . Allergy     soy  . Anxiety   . Myocardial infarction   . Sickle cell trait    Past Surgical History  Procedure Date  . Angioplasty 2006    x3 stents  . Foot surgery 2000  . Dilation and curettage of uterus   . Lithotripsy 2008    left kidney stone and also ureteral stent placed    reports that she has been smoking  Cigarettes.  She has never used smokeless tobacco. She reports that she does not drink alcohol or use illicit drugs. family history includes Coronary artery disease in her father and mother. Allergies  Allergen Reactions  . Adhesive (Tape)   . Shrimp (Shellfish Allergy)   . Soy Allergy Nausea And Vomiting   Current Outpatient Prescriptions on File Prior to Visit  Medication Sig Dispense Refill  . aspirin 81 MG EC tablet Take 81 mg by mouth 2 (two) times daily.        Marland Kitchen atorvastatin (LIPITOR) 80 MG tablet Take 1 tablet (80 mg total) by mouth daily.  30 tablet  6  . ezetimibe (ZETIA) 10 MG tablet Take 1 tablet (10 mg total) by mouth daily.  90 tablet  3  . metoprolol tartrate (LOPRESSOR) 25 MG tablet TAKE 1 TABLET BY MOUTH TWICE DAILY.  60 tablet  2  . nitroGLYCERIN (NITROLINGUAL) 0.4 MG/SPRAY spray Place 1 spray under the tongue every 5 (five) minutes as needed. May repeat up to 3 doses.  12 g  3  . enalapril (VASOTEC) 10 MG tablet Take 1 tablet (10 mg total) by mouth 2 (two) times daily.  60 tablet  11  . DISCONTD: escitalopram (LEXAPRO) 10 MG tablet Take 1 tablet (10 mg total) by mouth daily.  90 tablet  3   Review of Systems Review of Systems  Constitutional: Negative  for diaphoresis, activity change, appetite change and unexpected weight change.  HENT: Negative for hearing loss, ear pain, facial swelling, mouth sores and neck stiffness.   Eyes: Negative for pain, redness and visual disturbance.  Respiratory: Negative for shortness of breath and wheezing.   Cardiovascular: Negative for chest pain and palpitations.  Gastrointestinal: Negative for diarrhea, blood in stool, abdominal distention and rectal pain.  Genitourinary: Negative for hematuria, flank pain and decreased urine volume.  Musculoskeletal: Negative for myalgias and joint swelling.  Skin: Negative for color change and wound.  Neurological: Negative for syncope and numbness.  Hematological: Negative for adenopathy.    Psychiatric/Behavioral: Negative for hallucinations, self-injury, decreased concentration and agitation.      Objective:   Physical Exam BP 158/92  Pulse 70  Temp 98 F (36.7 C) (Oral)  Ht 5\' 3"  (1.6 m)  Wt 125 lb 8 oz (56.926 kg)  BMI 22.23 kg/m2  SpO2 98% Physical Exam  VS noted Constitutional: Pt is oriented to person, place, and time. Appears well-developed and well-nourished.  Head: Normocephalic and atraumatic.  Right Ear: External ear normal.  Left Ear: External ear normal.  Nose: Nose normal.  Mouth/Throat: Oropharynx is clear and moist.  Eyes: Conjunctivae and EOM are normal. Pupils are equal, round, and reactive to light.  Neck: Normal range of motion. Neck supple. No JVD present. No tracheal deviation present.  Cardiovascular: Normal rate, regular rhythm, normal heart sounds and intact distal pulses.   Pulmonary/Chest: Effort normal and breath sounds normal.  Abdominal: Soft. Bowel sounds are normal. There is no tenderness.  Musculoskeletal: Normal range of motion. Exhibits no edema.  Lymphadenopathy:  Has no cervical adenopathy.  Neurological: Pt is alert and oriented to person, place, and time. Pt has normal reflexes. No cranial nerve deficit.  Skin: Skin is warm and dry. No rash noted.  Psychiatric:  Has somewhat dysphoric mood and affect. Behavior is normal.     Assessment & Plan:

## 2012-05-06 NOTE — Patient Instructions (Addendum)
Please remember to followup with your GYN for the yearly pap smear and/or mammogram Please go to LAB in the Basement for the blood and/or urine tests to be done today You will be contacted by phone if any changes need to be made immediately.  Otherwise, you will receive a letter about your results with an explanation. Take all new medications as prescribed  - the generic avapro for blood pressure Continue all other medications as before, including the metoprolol, and the lexapro All of your medications were refilled today Please keep your appointments with your specialists as you have planned - Dr Myrtis Ser Please return in 6 months, or sooner if needed

## 2012-05-06 NOTE — Assessment & Plan Note (Addendum)
Mild uncontrolled, to ad avapro 150 qd  BP Readings from Last 3 Encounters:  05/06/12 158/92  10/21/11 142/90  05/20/11 139/79

## 2012-05-07 ENCOUNTER — Encounter: Payer: Self-pay | Admitting: Internal Medicine

## 2012-05-07 ENCOUNTER — Other Ambulatory Visit (INDEPENDENT_AMBULATORY_CARE_PROVIDER_SITE_OTHER): Payer: 59

## 2012-05-07 DIAGNOSIS — Z0000 Encounter for general adult medical examination without abnormal findings: Secondary | ICD-10-CM

## 2012-05-07 LAB — BASIC METABOLIC PANEL
BUN: 10 mg/dL (ref 6–23)
Calcium: 9.3 mg/dL (ref 8.4–10.5)
GFR: 97.94 mL/min (ref >60.00)
Glucose, Bld: 80 mg/dL (ref 70–99)
Potassium: 3.9 mEq/L (ref 3.5–5.1)
Sodium: 140 mEq/L (ref 135–145)

## 2012-05-07 LAB — URINALYSIS, ROUTINE W REFLEX MICROSCOPIC
Bilirubin Urine: NEGATIVE
Ketones, ur: NEGATIVE
Leukocytes, UA: NEGATIVE
Specific Gravity, Urine: <=1.005 (ref 1.000–1.030)
Total Protein, Urine: NEGATIVE
Urine Glucose: NEGATIVE
pH: 6 (ref 5.0–8.0)

## 2012-05-07 LAB — CBC WITH DIFFERENTIAL/PLATELET
Eosinophils Relative: 0.9 % (ref 0.0–5.0)
HCT: 44.2 % (ref 36.0–46.0)
Hemoglobin: 14.2 g/dL (ref 12.0–15.0)
Lymphs Abs: 3.1 10*3/uL (ref 0.7–4.0)
MCV: 91.3 fl (ref 78.0–100.0)
Monocytes Absolute: 0.6 10*3/uL (ref 0.1–1.0)
Monocytes Relative: 7.1 % (ref 3.0–12.0)
Neutro Abs: 4.5 10*3/uL (ref 1.4–7.7)
Platelets: 150 10*3/uL (ref 150.0–400.0)
WBC: 8.3 10*3/uL (ref 4.5–10.5)

## 2012-05-07 LAB — TSH: TSH: 1.69 u[IU]/mL (ref 0.35–5.50)

## 2012-05-07 LAB — HEPATIC FUNCTION PANEL
AST: 16 U/L (ref 0–37)
Albumin: 3.6 g/dL (ref 3.5–5.2)
Alkaline Phosphatase: 93 U/L (ref 39–117)
Total Bilirubin: 0.3 mg/dL (ref 0.3–1.2)

## 2012-05-07 LAB — LIPID PANEL
HDL: 72 mg/dL (ref >39.00)
LDL Cholesterol: 88 mg/dL (ref 0–99)
VLDL: 14 mg/dL (ref 0.0–40.0)

## 2012-05-10 ENCOUNTER — Encounter: Payer: Self-pay | Admitting: Internal Medicine

## 2012-05-10 NOTE — Assessment & Plan Note (Signed)
stable overall by hx and exam, most recent data reviewed with pt, and pt to continue medical treatment as before Lab Results  Component Value Date   LDLCALC 88 05/07/2012

## 2012-05-10 NOTE — Assessment & Plan Note (Signed)
Ok to re-start lexapro as did well with this prior

## 2012-05-10 NOTE — Assessment & Plan Note (Signed)

## 2012-11-04 ENCOUNTER — Ambulatory Visit: Payer: 59 | Admitting: Internal Medicine

## 2012-11-09 ENCOUNTER — Ambulatory Visit: Payer: 59 | Admitting: Internal Medicine

## 2012-11-16 ENCOUNTER — Encounter: Payer: Self-pay | Admitting: Internal Medicine

## 2012-11-16 ENCOUNTER — Ambulatory Visit (INDEPENDENT_AMBULATORY_CARE_PROVIDER_SITE_OTHER): Payer: 59 | Admitting: Internal Medicine

## 2012-11-16 VITALS — BP 162/90 | HR 80 | Temp 98.1°F | Ht 63.0 in | Wt 129.0 lb

## 2012-11-16 DIAGNOSIS — F411 Generalized anxiety disorder: Secondary | ICD-10-CM

## 2012-11-16 DIAGNOSIS — I10 Essential (primary) hypertension: Secondary | ICD-10-CM

## 2012-11-16 DIAGNOSIS — M25519 Pain in unspecified shoulder: Secondary | ICD-10-CM

## 2012-11-16 DIAGNOSIS — Z0000 Encounter for general adult medical examination without abnormal findings: Secondary | ICD-10-CM

## 2012-11-16 MED ORDER — IRBESARTAN 300 MG PO TABS: 300.0000 mg | ORAL_TABLET | Freq: Every day | ORAL | Status: DC

## 2012-11-16 MED ORDER — MELOXICAM 7.5 MG PO TABS: 7.5000 mg | ORAL_TABLET | Freq: Two times a day (BID) | ORAL | Status: DC | PRN

## 2012-11-16 MED ORDER — METOPROLOL TARTRATE 50 MG PO TABS: 50.0000 mg | ORAL_TABLET | Freq: Two times a day (BID) | ORAL | Status: DC

## 2012-11-16 NOTE — Patient Instructions (Addendum)
Please take all new medication as prescribed - the anti-inflammatory for pain OK to increase the avapro (generic) to 300 mg per day OK to increase the lopressor (generic) to 50 mg twice per day Please continue all other medications as before You will be contacted regarding the referral for: renal artery ultrasound Please continue to monitor your blood pressure at home; the goal is to be less than 140/90 Please call if the right shoulder pain is worse, for orthopedic referral Thank you for enrolling in MyChart. Please follow the instructions below to securely access your online medical record. MyChart allows you to send messages to your doctor, view your test results, renew your prescriptions, schedule appointments, and more. To Log into My Chart online, please go by [employer] or [employer] to [email or web address], or download the MyChart App from the [employer] .  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 Please return in 6 months, or sooner if needed, with Lab testing done 3-5 days before

## 2012-11-16 NOTE — Assessment & Plan Note (Signed)
Mild c/w MSK strain, for nsaid prn, rest and tylenol prn,  to f/u any worsening symptoms or concerns, consider ortho referral

## 2012-11-16 NOTE — Assessment & Plan Note (Addendum)
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

## 2012-11-16 NOTE — Progress Notes (Signed)
Subjective:    Patient ID: Elizabeth [name], female    DOB: January 07, 1951, 62 y.o.   MRN: [id number]  HPI   Here to f/u; overall doing ok,  Pt denies chest pain, increased sob or doe, wheezing, orthopnea, PND, increased LE swelling, palpitations, dizziness or syncope.  Pt denies polydipsia, polyuria, or low sugar symptoms such as weakness or confusion improved with po intake.  Pt denies new neurological symptoms such as new headache, or facial or extremity weakness or numbness.   Pt states overall good compliance with meds, has been trying to follow lower cholesterol diet, with wt overall stable,  And Overall good compliance with treatment, and good medicine tolerability. 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  No neck or radicular pain, rash, fever, trauma or RUE weakness.  Denies worsening depressive symptoms, suicidal ideation, or panic; has ongoing anxiety, ok recent without increased stressors. Past Medical History  Diagnosis Date  . 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 Hypertension   . Hyperlipidemia   . Abrasion or friction burn of foot and toe(s), with infection   . Degenerative joint disease     Generalized  . Tobacco abuse April, 2011    Continued  . Allergy     soy  . Anxiety   . Myocardial infarction   . Sickle cell trait    Past Surgical History  Procedure Laterality Date  . Angioplasty  2006    x3 stents  . Foot surgery  2000  . Dilation and curettage of uterus    . Lithotripsy  2008    left kidney stone and also ureteral stent placed    reports that she has been smoking Cigarettes.  She has been smoking about 0.00 packs per day. She has never used smokeless tobacco. She reports that she does not drink alcohol or use illicit  drugs. family history includes Coronary artery disease in her father and mother. Allergies  Allergen Reactions  . Adhesive (Tape)   . Shrimp (Shellfish Allergy)   . Soy Allergy Nausea And Vomiting   Current Outpatient Prescriptions on File Prior to Visit  Medication Sig Dispense Refill  . aspirin 81 MG EC tablet Take 81 mg by mouth 2 (two) times daily.        Marland Kitchen atorvastatin (LIPITOR) 80 MG tablet Take 1 tablet (80 mg total) by mouth daily.  90 tablet  3  . ezetimibe (ZETIA) 10 MG tablet Take 1 tablet (10 mg total) by mouth daily.  90 tablet  3  . nitroGLYCERIN (NITROLINGUAL) 0.4 MG/SPRAY spray Place 1 spray under the tongue every 5 (five) minutes as needed. May repeat up to 3 doses.  12 g  3  . enalapril (VASOTEC) 10 MG tablet Take 1 tablet (10 mg total) by mouth 2 (two) times daily.  60 tablet  11  . escitalopram (LEXAPRO) 10 MG tablet Take 1 tablet (10 mg total) by mouth daily.  90 tablet  3   No current facility-administered medications on file prior to visit.   Review of Systems  Constitutional: Negative for unexpected weight change, or unusual diaphoresis  HENT: Negative for tinnitus.   Eyes: Negative for photophobia and visual disturbance.  Respiratory: Negative for choking and stridor.   Gastrointestinal:  Negative for vomiting and blood in stool.  Genitourinary: Negative for hematuria and decreased urine volume.  Musculoskeletal: Negative for acute joint swelling Skin: Negative for color change and wound.  Neurological: Negative for tremors and numbness other than noted  Psychiatric/Behavioral: Negative for decreased concentration or  hyperactivity.       Objective:   Physical Exam BP 162/90  Pulse 80  Temp(Src) 98.1 F (36.7 C) (Oral)  Ht 5\' 3"  (1.6 m)  Wt 129 lb (58.514 kg)  BMI 22.86 kg/m2  SpO2 95% VS noted,  Constitutional: Pt appears well-developed and well-nourished.  HENT: Head: NCAT.  Right Ear: External ear normal.  Left Ear: External ear normal.  Eyes:  Conjunctivae and EOM are normal. Pupils are equal, round, and reactive to light.  Neck: Normal range of motion. Neck supple.  Cardiovascular: Normal rate and regular rhythm.   Pulmonary/Chest: Effort normal and breath sounds normal.  Neurological: Pt is alert. Not confused  Skin: Skin is warm. No erythema.  Psychiatric: Pt behavior is normal. Thought content normal.  Right shoulder with mild anterior tender but o/w NT, FROM Right arm o/w neuorvasc intact    Assessment & Plan:

## 2012-11-16 NOTE — Assessment & Plan Note (Signed)
stable overall by history and exam, recent data reviewed with pt, and pt to continue medical treatment as before,  to f/u any worsening symptoms or concerns Lab Results  Component Value Date   WBC 8.3 05/07/2012   HGB 14.2 05/07/2012   HCT 44.2 05/07/2012   PLT 150.0 05/07/2012   GLUCOSE 80 05/07/2012   CHOL 174 05/07/2012   TRIG 70.0 05/07/2012   HDL 72.00 05/07/2012   LDLDIRECT 164.8 01/17/2010   LDLCALC 88 05/07/2012   ALT 12 05/07/2012   AST 16 05/07/2012   NA 140 05/07/2012   K 3.9 05/07/2012   CL 107 05/07/2012   CREATININE 0.8 05/07/2012   BUN 10 05/07/2012   CO2 24 05/07/2012   TSH 1.69 05/07/2012

## 2012-11-17 ENCOUNTER — Encounter: Payer: Self-pay | Admitting: Internal Medicine

## 2012-12-02 ENCOUNTER — Encounter (INDEPENDENT_AMBULATORY_CARE_PROVIDER_SITE_OTHER): Payer: 59

## 2012-12-02 DIAGNOSIS — I10 Essential (primary) hypertension: Secondary | ICD-10-CM

## 2013-04-12 ENCOUNTER — Ambulatory Visit: Payer: 59 | Admitting: Internal Medicine

## 2013-05-17 ENCOUNTER — Other Ambulatory Visit (INDEPENDENT_AMBULATORY_CARE_PROVIDER_SITE_OTHER): Payer: 59

## 2013-05-17 ENCOUNTER — Encounter: Payer: Self-pay | Admitting: Internal Medicine

## 2013-05-17 ENCOUNTER — Ambulatory Visit (INDEPENDENT_AMBULATORY_CARE_PROVIDER_SITE_OTHER): Payer: 59 | Admitting: Internal Medicine

## 2013-05-17 VITALS — BP 132/80 | HR 62 | Temp 98.4°F | Ht 63.0 in | Wt 129.4 lb

## 2013-05-17 DIAGNOSIS — Z0000 Encounter for general adult medical examination without abnormal findings: Secondary | ICD-10-CM

## 2013-05-17 DIAGNOSIS — M5412 Radiculopathy, cervical region: Secondary | ICD-10-CM

## 2013-05-17 LAB — CBC WITH DIFFERENTIAL/PLATELET
Basophils Relative: 0.5 % (ref 0.0–3.0)
Eosinophils Relative: 1.7 % (ref 0.0–5.0)
HCT: 42 % (ref 36.0–46.0)
Lymphs Abs: 3 10*3/uL (ref 0.7–4.0)
MCV: 87.3 fl (ref 78.0–100.0)
Monocytes Absolute: 0.7 10*3/uL (ref 0.1–1.0)
Neutro Abs: 5.6 10*3/uL (ref 1.4–7.7)
Platelets: 167 10*3/uL (ref 150.0–400.0)
RBC: 4.81 Mil/uL (ref 3.87–5.11)
WBC: 9.5 10*3/uL (ref 4.5–10.5)

## 2013-05-17 LAB — URINALYSIS, ROUTINE W REFLEX MICROSCOPIC
Bilirubin Urine: NEGATIVE
Leukocytes, UA: NEGATIVE
Nitrite: NEGATIVE

## 2013-05-17 LAB — LIPID PANEL
Cholesterol: 184 mg/dL (ref 0–200)
Total CHOL/HDL Ratio: 4

## 2013-05-17 LAB — HEPATIC FUNCTION PANEL
ALT: 15 U/L (ref 0–35)
Albumin: 3.7 g/dL (ref 3.5–5.2)
Total Protein: 7 g/dL (ref 6.0–8.3)

## 2013-05-17 LAB — BASIC METABOLIC PANEL
Calcium: 9.5 mg/dL (ref 8.4–10.5)
Creatinine, Ser: 0.7 mg/dL (ref 0.4–1.2)
GFR: 102.19 mL/min (ref >60.00)

## 2013-05-17 LAB — TSH: TSH: 1.7 u[IU]/mL (ref 0.35–5.50)

## 2013-05-17 NOTE — Progress Notes (Signed)
Subjective:    Patient ID: Elizabeth [name], female    DOB: Nov 20, 1950, 62 y.o.   MRN: [id number]  HPI  Here for wellness and f/u;  Overall doing ok;  Pt denies CP, worsening SOB, DOE, wheezing, orthopnea, PND, worsening LE edema, palpitations, dizziness or syncope.  Pt denies neurological change such as new headache, facial or extremity weakness, but does have recurring pain on occasion to the right neck to the distal RUE, but no weakness or numbness.  Pt denies polydipsia, polyuria, or low sugar symptoms. Pt states overall good compliance with treatment and medications, good tolerability, and has been trying to follow lower cholesterol diet.  Pt denies worsening depressive symptoms, suicidal ideation or panic. No fever, night sweats, wt loss, loss of appetite, or other constitutional symptoms.  Pt states good ability with ADL's, has low fall risk, home safety reviewed and adequate, no other significant changes in hearing or vision, and only occasionally active with exercise.   Past Medical History  Diagnosis Date  . 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 Hypertension   . Hyperlipidemia   . Abrasion or friction burn of foot and toe(s), with infection   . Degenerative joint disease     Generalized  . Tobacco abuse April, 2011    Continued  . Allergy     soy  . Anxiety   . Myocardial infarction   . Sickle cell trait    Past Surgical History  Procedure Laterality Date  . Angioplasty  2006    x3 stents  . Foot surgery  2000  . Dilation and curettage of uterus    . Lithotripsy  2008    left kidney stone and also ureteral stent placed    reports that she has been smoking Cigarettes.  She has been smoking about 0.00 packs per day. She has never used smokeless tobacco. She reports that she does not drink alcohol or use illicit drugs. family history includes Coronary artery disease in her father and  mother. Allergies  Allergen Reactions  . Adhesive [Tape]   . Shrimp [Shellfish Allergy]   . Soy Allergy Nausea And Vomiting   Current Outpatient Prescriptions on File Prior to Visit  Medication Sig Dispense Refill  . aspirin 81 MG EC tablet Take 81 mg by mouth 2 (two) times daily.        Marland Kitchen atorvastatin (LIPITOR) 80 MG tablet Take 1 tablet (80 mg total) by mouth daily.  90 tablet  3  . irbesartan (AVAPRO) 300 MG tablet Take 1 tablet (300 mg total) by mouth daily.  90 tablet  3  . meloxicam (MOBIC) 7.5 MG tablet Take 1 tablet (7.5 mg total) by mouth 2 (two) times daily as needed for pain.  60 tablet  3  . metoprolol (LOPRESSOR) 50 MG tablet Take 1 tablet (50 mg total) by mouth 2 (two) times daily.  180 tablet  3  . nitroGLYCERIN (NITROLINGUAL) 0.4 MG/SPRAY spray Place 1 spray under the tongue every 5 (five) minutes as needed. May repeat up to 3 doses.  12 g  3  . enalapril (VASOTEC) 10 MG tablet Take 1 tablet (10 mg total) by mouth 2 (two) times daily.  60 tablet  11  . escitalopram (LEXAPRO) 10 MG tablet Take 1 tablet (10 mg total) by mouth daily.  90 tablet  3  . ezetimibe (ZETIA) 10 MG tablet Take 1  tablet (10 mg total) by mouth daily.  90 tablet  3   No current facility-administered medications on file prior to visit.   Review of Systems Constitutional: Negative for diaphoresis, activity change, appetite change or unexpected weight change.  HENT: Negative for hearing loss, ear pain, facial swelling, mouth sores and neck stiffness.   Eyes: Negative for pain, redness and visual disturbance.  Respiratory: Negative for shortness of breath and wheezing.   Cardiovascular: Negative for chest pain and palpitations.  Gastrointestinal: Negative for diarrhea, blood in stool, abdominal distention or other pain Genitourinary: Negative for hematuria, flank pain or change in urine volume.  Musculoskeletal: Negative for myalgias and joint swelling.  Skin: Negative for color change and wound.   Neurological: Negative for syncope and numbness. other than noted Hematological: Negative for adenopathy.  Psychiatric/Behavioral: Negative for hallucinations, self-injury, decreased concentration and agitation.      Objective:   Physical Exam BP 132/80  Pulse 62  Temp(Src) 98.4 F (36.9 C) (Oral)  Ht 5\' 3"  (1.6 m)  Wt 129 lb 6 oz (58.684 kg)  BMI 22.92 kg/m2  SpO2 96% VS noted,  Constitutional: Pt is oriented to person, place, and time. Appears well-developed and well-nourished.  Head: Normocephalic and atraumatic.  Right Ear: External ear normal.  Left Ear: External ear normal.  Nose: Nose normal.  Mouth/Throat: Oropharynx is clear and moist.  Eyes: Conjunctivae and EOM are normal. Pupils are equal, round, and reactive to light.  Neck: Normal range of motion. Neck supple. No JVD present. No tracheal deviation present.  Cardiovascular: Normal rate, regular rhythm, normal heart sounds and intact distal pulses.   Pulmonary/Chest: Effort normal and breath sounds normal.  Abdominal: Soft. Bowel sounds are normal. There is no tenderness. No HSM  Musculoskeletal: Normal range of motion. Exhibits no edema.  Lymphadenopathy:  Has no cervical adenopathy.  Neurological: Pt is alert and oriented to person, place, and time. Pt has normal reflexes. No cranial nerve deficit. Motor 5/5, dtr/gait intact Skin: Skin is warm and dry. No rash noted.  Psychiatric:  Has  normal mood and affect. Behavior is normal.     Assessment & Plan:

## 2013-05-17 NOTE — Assessment & Plan Note (Signed)

## 2013-05-17 NOTE — Patient Instructions (Addendum)
Please remember to followup with your GYN for the yearly pap smear and/or mammogram Please continue all other medications as before, and refills have been done if requested. Please have the pharmacy call with any other refills you may need. Please go to the LAB in the Basement (turn left off the elevator) for the tests to be done today You will be contacted by phone if any changes need to be made immediately.  Otherwise, you will receive a letter about your results with an explanation, but please check with MyChart first.  Please remember to sign up for My Chart if you have not done so, as this will be important to you in the future with finding out test results, communicating by private email, and scheduling acute appointments online when needed.  Please return in 1 year for your yearly visit, or sooner if needed, with Lab testing done 3-5 days before

## 2013-05-17 NOTE — Assessment & Plan Note (Signed)
Right side, mild intermittent, no neuro change, ok to follow for now,  to f/u any worsening symptoms or concerns

## 2013-06-18 ENCOUNTER — Other Ambulatory Visit: Payer: Self-pay | Admitting: Internal Medicine

## 2013-07-29 ENCOUNTER — Other Ambulatory Visit: Payer: Self-pay

## 2014-05-18 ENCOUNTER — Encounter: Payer: 59 | Admitting: Internal Medicine

## 2014-07-08 ENCOUNTER — Other Ambulatory Visit: Payer: Self-pay

## 2014-10-04 ENCOUNTER — Encounter: Payer: 59 | Admitting: Internal Medicine

## 2015-09-24 DIAGNOSIS — I639 Cerebral infarction, unspecified: Secondary | ICD-10-CM

## 2015-09-24 HISTORY — DX: Cerebral infarction, unspecified: I63.9

## 2016-04-10 ENCOUNTER — Encounter: Payer: Self-pay | Admitting: Internal Medicine

## 2016-05-19 ENCOUNTER — Emergency Department (HOSPITAL_COMMUNITY): Payer: 59

## 2016-05-19 ENCOUNTER — Encounter (HOSPITAL_COMMUNITY): Payer: Self-pay | Admitting: Emergency Medicine

## 2016-05-19 ENCOUNTER — Inpatient Hospital Stay (HOSPITAL_COMMUNITY)
Admission: EM | Admit: 2016-05-19 | Discharge: 2016-05-22 | DRG: 041 | Disposition: A | Discharge status: Rehabilitation Facility | Payer: 59 | Attending: Internal Medicine | Admitting: Internal Medicine

## 2016-05-19 ENCOUNTER — Observation Stay (HOSPITAL_COMMUNITY): Payer: 59

## 2016-05-19 DIAGNOSIS — R4781 Slurred speech: Secondary | ICD-10-CM | POA: Diagnosis not present

## 2016-05-19 DIAGNOSIS — R29810 Facial weakness: Secondary | ICD-10-CM | POA: Diagnosis present

## 2016-05-19 DIAGNOSIS — F329 Major depressive disorder, single episode, unspecified: Secondary | ICD-10-CM | POA: Diagnosis present

## 2016-05-19 DIAGNOSIS — Z955 Presence of coronary angioplasty implant and graft: Secondary | ICD-10-CM

## 2016-05-19 DIAGNOSIS — R51 Headache: Secondary | ICD-10-CM | POA: Diagnosis not present

## 2016-05-19 DIAGNOSIS — I6340 Cerebral infarction due to embolism of unspecified cerebral artery: Secondary | ICD-10-CM | POA: Diagnosis present

## 2016-05-19 DIAGNOSIS — R2990 Unspecified symptoms and signs involving the nervous system: Secondary | ICD-10-CM | POA: Diagnosis not present

## 2016-05-19 DIAGNOSIS — F32A Depression, unspecified: Secondary | ICD-10-CM | POA: Insufficient documentation

## 2016-05-19 DIAGNOSIS — I63411 Cerebral infarction due to embolism of right middle cerebral artery: Secondary | ICD-10-CM | POA: Diagnosis not present

## 2016-05-19 DIAGNOSIS — G8194 Hemiplegia, unspecified affecting left nondominant side: Secondary | ICD-10-CM | POA: Diagnosis present

## 2016-05-19 DIAGNOSIS — I639 Cerebral infarction, unspecified: Secondary | ICD-10-CM

## 2016-05-19 DIAGNOSIS — R471 Dysarthria and anarthria: Secondary | ICD-10-CM | POA: Diagnosis present

## 2016-05-19 DIAGNOSIS — Z720 Tobacco use: Secondary | ICD-10-CM | POA: Diagnosis present

## 2016-05-19 DIAGNOSIS — Z91013 Allergy to seafood: Secondary | ICD-10-CM

## 2016-05-19 DIAGNOSIS — I252 Old myocardial infarction: Secondary | ICD-10-CM | POA: Diagnosis not present

## 2016-05-19 DIAGNOSIS — F4322 Adjustment disorder with anxiety: Secondary | ICD-10-CM | POA: Diagnosis present

## 2016-05-19 DIAGNOSIS — I2510 Atherosclerotic heart disease of native coronary artery without angina pectoris: Secondary | ICD-10-CM | POA: Diagnosis not present

## 2016-05-19 DIAGNOSIS — M1990 Unspecified osteoarthritis, unspecified site: Secondary | ICD-10-CM | POA: Diagnosis present

## 2016-05-19 DIAGNOSIS — I10 Essential (primary) hypertension: Secondary | ICD-10-CM | POA: Diagnosis not present

## 2016-05-19 DIAGNOSIS — D573 Sickle-cell trait: Secondary | ICD-10-CM | POA: Diagnosis present

## 2016-05-19 DIAGNOSIS — I69322 Dysarthria following cerebral infarction: Secondary | ICD-10-CM | POA: Diagnosis not present

## 2016-05-19 DIAGNOSIS — Z7982 Long term (current) use of aspirin: Secondary | ICD-10-CM

## 2016-05-19 DIAGNOSIS — E785 Hyperlipidemia, unspecified: Secondary | ICD-10-CM | POA: Diagnosis not present

## 2016-05-19 DIAGNOSIS — E782 Mixed hyperlipidemia: Secondary | ICD-10-CM | POA: Diagnosis present

## 2016-05-19 DIAGNOSIS — F17210 Nicotine dependence, cigarettes, uncomplicated: Secondary | ICD-10-CM | POA: Diagnosis present

## 2016-05-19 DIAGNOSIS — Z91048 Other nonmedicinal substance allergy status: Secondary | ICD-10-CM

## 2016-05-19 DIAGNOSIS — Z888 Allergy status to other drugs, medicaments and biological substances status: Secondary | ICD-10-CM

## 2016-05-19 DIAGNOSIS — I6349 Cerebral infarction due to embolism of other cerebral artery: Secondary | ICD-10-CM | POA: Diagnosis not present

## 2016-05-19 DIAGNOSIS — Z8249 Family history of ischemic heart disease and other diseases of the circulatory system: Secondary | ICD-10-CM

## 2016-05-19 DIAGNOSIS — H5462 Unqualified visual loss, left eye, normal vision right eye: Secondary | ICD-10-CM | POA: Diagnosis present

## 2016-05-19 DIAGNOSIS — Z8673 Personal history of transient ischemic attack (TIA), and cerebral infarction without residual deficits: Secondary | ICD-10-CM

## 2016-05-19 LAB — DIFFERENTIAL
BASOS ABS: 0.1 10*3/uL (ref 0.0–0.1)
BASOS PCT: 1 %
EOS ABS: 0.1 10*3/uL (ref 0.0–0.7)
EOS PCT: 1 %
LYMPHS ABS: 3.6 10*3/uL (ref 0.7–4.0)
Lymphocytes Relative: 39 %
MONOS PCT: 7 %
Monocytes Absolute: 0.7 10*3/uL (ref 0.1–1.0)
NEUTROS PCT: 52 %
Neutro Abs: 4.8 10*3/uL (ref 1.7–7.7)

## 2016-05-19 LAB — CBC
HCT: 42.6 % (ref 36.0–46.0)
HEMOGLOBIN: 14.6 g/dL (ref 12.0–15.0)
MCH: 29.6 pg (ref 26.0–34.0)
MCHC: 34.3 g/dL (ref 30.0–36.0)
MCV: 86.4 fL (ref 78.0–100.0)
Platelets: 157 10*3/uL (ref 150–400)
RBC: 4.93 MIL/uL (ref 3.87–5.11)
RDW: 14.5 % (ref 11.5–15.5)
WBC: 9.3 10*3/uL (ref 4.0–10.5)

## 2016-05-19 LAB — I-STAT CHEM 8, ED
BUN: 9 mg/dL (ref 6–20)
CHLORIDE: 103 mmol/L (ref 101–111)
CREATININE: 0.7 mg/dL (ref 0.44–1.00)
Calcium, Ion: 1.26 mmol/L — ABNORMAL HIGH (ref 1.12–1.23)
GLUCOSE: 93 mg/dL (ref 65–99)
HCT: 47 % — ABNORMAL HIGH (ref 36.0–46.0)
Hemoglobin: 16 g/dL — ABNORMAL HIGH (ref 12.0–15.0)
POTASSIUM: 4.1 mmol/L (ref 3.5–5.1)
Sodium: 139 mmol/L (ref 135–145)
TCO2: 27 mmol/L (ref 0–100)

## 2016-05-19 LAB — COMPREHENSIVE METABOLIC PANEL
ALT: 11 U/L — AB (ref 14–54)
AST: 18 U/L (ref 15–41)
Albumin: 3.7 g/dL (ref 3.5–5.0)
Alkaline Phosphatase: 101 U/L (ref 38–126)
Anion gap: 3 — ABNORMAL LOW (ref 5–15)
BUN: 9 mg/dL (ref 6–20)
CHLORIDE: 108 mmol/L (ref 101–111)
CO2: 25 mmol/L (ref 22–32)
CREATININE: 0.83 mg/dL (ref 0.44–1.00)
Calcium: 10 mg/dL (ref 8.9–10.3)
GFR calc Af Amer: >60 mL/min (ref >60)
GFR calc non Af Amer: >60 mL/min (ref >60)
GLUCOSE: 111 mg/dL — AB (ref 65–99)
Potassium: 3.9 mmol/L (ref 3.5–5.1)
Sodium: 136 mmol/L (ref 135–145)
Total Bilirubin: 0.5 mg/dL (ref 0.3–1.2)
Total Protein: 6.6 g/dL (ref 6.5–8.1)

## 2016-05-19 LAB — I-STAT TROPONIN, ED: Troponin i, poc: 0.01 ng/mL (ref 0.00–0.08)

## 2016-05-19 LAB — PROTIME-INR
INR: 0.95
Prothrombin Time: 12.6 seconds (ref 11.4–15.2)

## 2016-05-19 LAB — APTT: APTT: 29 s (ref 24–36)

## 2016-05-19 MED ORDER — SODIUM CHLORIDE 0.9 % IV SOLN: INTRAVENOUS | Status: DC

## 2016-05-19 MED ORDER — SENNOSIDES-DOCUSATE SODIUM 8.6-50 MG PO TABS: 1.0000 | ORAL_TABLET | Freq: Every evening | ORAL | Status: DC | PRN

## 2016-05-19 MED ORDER — ENOXAPARIN SODIUM 40 MG/0.4ML ~~LOC~~ SOLN
40.0000 mg | SUBCUTANEOUS | Status: DC
  Administered 2016-05-19 – 2016-05-20 (×2): 40 mg via SUBCUTANEOUS
  Filled 2016-05-19 (×2): qty 0.4

## 2016-05-19 MED ORDER — METOPROLOL TARTRATE 50 MG PO TABS
50.0000 mg | ORAL_TABLET | Freq: Two times a day (BID) | ORAL | Status: DC
  Administered 2016-05-19 – 2016-05-22 (×6): 50 mg via ORAL
  Filled 2016-05-19 (×6): qty 1

## 2016-05-19 MED ORDER — IRBESARTAN 300 MG PO TABS
300.0000 mg | ORAL_TABLET | Freq: Every day | ORAL | Status: DC
  Administered 2016-05-19 – 2016-05-22 (×4): 300 mg via ORAL
  Filled 2016-05-19 (×4): qty 1

## 2016-05-19 MED ORDER — ESCITALOPRAM OXALATE 10 MG PO TABS
10.0000 mg | ORAL_TABLET | Freq: Every day | ORAL | Status: DC
  Administered 2016-05-19 – 2016-05-22 (×3): 10 mg via ORAL
  Filled 2016-05-19 (×4): qty 1

## 2016-05-19 MED ORDER — ASPIRIN 300 MG RE SUPP: 300.0000 mg | Freq: Every day | RECTAL | Status: DC

## 2016-05-19 MED ORDER — ASPIRIN 325 MG PO TABS
325.0000 mg | ORAL_TABLET | Freq: Every day | ORAL | Status: DC
  Administered 2016-05-19 – 2016-05-22 (×4): 325 mg via ORAL
  Filled 2016-05-19 (×4): qty 1

## 2016-05-19 MED ORDER — EZETIMIBE 10 MG PO TABS
10.0000 mg | ORAL_TABLET | Freq: Every day | ORAL | Status: DC
  Administered 2016-05-19 – 2016-05-22 (×4): 10 mg via ORAL
  Filled 2016-05-19 (×4): qty 1

## 2016-05-19 MED ORDER — STROKE: EARLY STAGES OF RECOVERY BOOK: Freq: Once | Status: DC

## 2016-05-19 MED ORDER — ATORVASTATIN CALCIUM 80 MG PO TABS
80.0000 mg | ORAL_TABLET | Freq: Every day | ORAL | Status: DC
  Administered 2016-05-19 – 2016-05-22 (×4): 80 mg via ORAL
  Filled 2016-05-19 (×4): qty 1

## 2016-05-19 NOTE — Consult Note (Signed)
Initial Neurological Consultation                      NEURO HOSPITALIST CONSULT NOTE   Requestig physician: Dr. Tomi Bamberger   Reason for Consult:  Left facial droop with blurring of vision in left eye.   HPI:                                                                                                                                          Elizabeth [name] is a 65 year old patient who reports that when she woke up this morning she noted a slight slurring of her speech and weakness of the left side of her face. She felt a slight heaviness of her left upper extremity but no distinct weakness. She reports that the symptoms progressed and that during the day. When she went to church she felt that she had some decreased vision in her left eye. This is presently resolved. She also reports that she fell on Wednesday when she was getting into the bathtub and she had lost her balance.  She has a known history of hypertension but reports that she ran out of her blood pressure medication a few days ago.   Past Medical History:  Diagnosis Date  . Abrasion or friction burn of foot and toe(s), with infection   . Allergy    soy  . Anxiety   . 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 Degenerative joint disease    Generalized  . Hyperlipidemia   . Hypertension   . Myocardial infarction ([place])   . Sickle cell trait ([place])   . Tobacco abuse April, 2011   Continued    Past Surgical History:  Procedure Laterality Date  . ANGIOPLASTY  2006   x3 stents  . DILATION AND CURETTAGE OF UTERUS    . FOOT SURGERY  2000  . LITHOTRIPSY  2008   left kidney stone and also ureteral stent placed    MEDICATIONS:                                                                                                                     I have reviewed the patient's current medications.  Allergies  Allergen Reactions  . Shrimp  [Shellfish Allergy] Nausea And Vomiting  .  Adhesive [Tape] Other (See Comments)    Unknown  . Soy Allergy Nausea And Vomiting     Social History:  reports that she has been smoking Cigarettes.  She has never used smokeless tobacco. She reports that she does not drink alcohol or use drugs.  Family History  Problem Relation Age of Onset  . Coronary artery disease Mother   . Coronary artery disease Father      ROS:                                                                                                                                       History obtained from chart review  General ROS: negative for - chills, fatigue, fever, night sweats, weight gain or weight loss Psychological ROS: negative for - behavioral disorder, hallucinations, memory difficulties, mood swings or suicidal ideation Ophthalmic ROS: negative for - blurry vision, double vision, eye pain or loss of vision ENT ROS: negative for - epistaxis, nasal discharge, oral lesions, sore throat, tinnitus or vertigo Allergy and Immunology ROS: negative for - hives or itchy/watery eyes Hematological and Lymphatic ROS: negative for - bleeding problems, bruising or swollen lymph nodes Endocrine ROS: negative for - galactorrhea, hair pattern changes, polydipsia/polyuria or temperature intolerance Respiratory ROS: negative for - cough, hemoptysis, shortness of breath or wheezing Cardiovascular ROS:  Hypertension, hyperlipidemia Gastrointestinal ROS: negative for - abdominal pain, diarrhea, hematemesis, nausea/vomiting or stool incontinence Genito-Urinary ROS: negative for - dysuria, hematuria, incontinence or urinary frequency/urgency Musculoskeletal ROS: negative for - joint swelling or muscular weakness Neurological ROS: as noted in HPI Dermatological ROS: negative for rash and skin lesion changes   General Exam                                                                                                      Blood  pressure 190/98, pulse 67, temperature 98 F (36.7 C), resp. rate 17, SpO2 98 %. HEENT-  Normocephalic, no lesions, without obvious abnormality.  Normal external eye and conjunctiva.  Normal TM's bilaterally.  Normal auditory canals and external ears. Normal external nose, mucus membranes and septum.  Normal pharynx. Cardiovascular- regular rate and rhythm, S1, S2 normal, no murmur, click, rub or gallop, pulses palpable throughout   Lungs- chest clear, no wheezing, rales, normal symmetric air entry, Heart exam - S1, S2 normal, no murmur, no gallop, rate regular Abdomen- soft, non-tender; bowel sounds normal; no masses,  no organomegaly Extremities- less then 2 second capillary refill   Neurological Examination  Mental Status: Alert, oriented, thought content appropriate.  Speech fluent without evidence of aphasia.  Able to follow 3 step commands without difficulty. Cranial Nerves: Pupils equal round reactive to light and accommodation. Extraocular movements are intact. Visual fields are full. There is a left facial droop. Tongue is midline. Motor: Father 5 bilaterally with no pronator drift. Sensory: Pinprick and light touch intact throughout, bilaterally Deep Tendon Reflexes: 2+ and symmetric throughout Plantars: Right: downgoing   Left: downgoing Cerebellar: normal finger-to-nose, normal rapid alternating movements and normal heel-to-shin test Gait: normal gait and station      Lab Results: Basic Metabolic Panel:  Recent Labs Lab 05/19/16 1444 05/19/16 1610  NA 136 139  K 3.9 4.1  CL 108 103  CO2 25  --   GLUCOSE 111* 93  BUN 9 9  CREATININE 0.83 0.70  CALCIUM 10.0  --     Liver Function Tests:  Recent Labs Lab 05/19/16 1444  AST 18  ALT 11*  ALKPHOS 101  BILITOT 0.5  PROT 6.6  ALBUMIN 3.7   No results for input(s): LIPASE, AMYLASE in the last 168 hours. No results for input(s): AMMONIA in the last 168 hours.  CBC:  Recent Labs Lab 05/19/16 1444  05/19/16 1610  WBC 9.3  --   NEUTROABS 4.8  --   HGB 14.6 16.0*  HCT 42.6 47.0*  MCV 86.4  --   PLT 157  --     Cardiac Enzymes: No results for input(s): CKTOTAL, CKMB, CKMBINDEX, TROPONINI in the last 168 hours.  Lipid Panel: No results for input(s): CHOL, TRIG, HDL, CHOLHDL, VLDL, LDLCALC in the last 168 hours.  CBG: No results for input(s): GLUCAP in the last 168 hours.  Microbiology: No results found for this or any previous visit.  Coagulation Studies:  Recent Labs  05/19/16 1444  LABPROT 12.6  INR 0.95    Imaging: Ct Head Wo Contrast  Result Date: 05/19/2016 CLINICAL DATA:  Left-sided facial droop and weakness. Slight slurred speech. EXAM: CT HEAD WITHOUT CONTRAST TECHNIQUE: Contiguous axial images were obtained from the base of the skull through the vertex without intravenous contrast. COMPARISON:  None. FINDINGS: Brain: No evidence of acute hemorrhage, hydrocephalus, extra-axial collection or mass lesion/mass effect. There is a small area of hypoattenuation in the deep white matter of the right frontal lobe, bordering the right lateral ventricle, likely representing an age-indeterminate ischemic infarct. Vascular: No hyperdense vessel or unexpected calcification. Skull: No acute fractures. Sinuses/Orbits: No acute finding. Other: None. IMPRESSION: Small area of hypoattenuation in the deep white matter of the right frontal lobe, which likely represents an age-indeterminate ischemic infarct. Alternatively this may represent an area of demyelination. Mild brain parenchymal atrophy. These results were called by telephone at the time of interpretation on 05/19/2016 at 3:46 pm to Dr. Audie Pinto, who verbally acknowledged these results. Electronically Signed   By: Fidela Salisbury M.D.   On: 05/19/2016 15:51       Assessment/Plan:  Elizabeth [name] is a pleasant 65 year old patient she presents with a history of a facial droop that she noted when she woke up this morning. She is not a  candidate for TPA as she is not within the time window and her symptoms are mild. Her last known normal exams when she went to bed last night. Her CT reveals a small low-density area in the right frontal region that may correspond with her symptoms.  Plan:  1. HgbA1c, fasting lipid panel 2. MRI, MRA  of the brain without contrast  3. PT consult, OT consult, Speech consult 4. Echocardiogram 5. Carotid dopplers 6. Prophylactic therapy  7. Risk factor modification 8. Telemetry monitoring 9. Frequent neuro checks 10 NPO until passes stroke swallow screen 11 please page stroke NP  Or  PA  Or MD from 8am -4 pm  as this patient from this time will be  followed by the stroke.   You can look them up on [email or web address]  Password [email or web address]     James A. Tasia Catchings, M.D. Neurohospitalist Phone: [phone]  05/19/2016, 5:07 PM

## 2016-05-19 NOTE — ED Notes (Signed)
Pt passed swallow screen. Pt a/o x 4, ambulatory. Has NOT had any medications in > 1 year. No neuro complaints.

## 2016-05-19 NOTE — Progress Notes (Signed)
Results of MRA /head/brain called to Dr Nicole Kindred .

## 2016-05-19 NOTE — H&P (Signed)
History and Physical    Elve Dohrman [id number] DOB: 04-May-1951 DOA: 05/19/2016  Referring MD/NP/PA: Dorie Rank PCP: Cathlean Cower, MD  Patient coming from: home  Chief Complaint: facial droop on the left, slurred speech   HPI: Layza Canteen is a 65 y.o. female with medical history significant of CAD s/p MI x 3 with three DES, hypertension, hyperlipidemia, anxiety, arthritis, sickle cell trait, active smoking who presents with left facial droop and slurred speech.  She has not seen her PCP since 2014 and ran out of her blood pressure medications two weeks prior to admission.  She awoke this morning and noticed that she had some drooping of the left side of her face, some slurred speech.  She is left-handed and when she tried to write, it was difficult and her left leg was heavy and didn't rise as high during a step compared to normal.  She denies numbness, tingling, confusion, word-finding difficulties.  She has noticed some vision loss in the left lower quadrant.  She came to the ER because her symptoms persisted and she was worried about a stroke.  Denies recent symptoms of infection.  She had two mechanical falls earlier this week, once when trying to reach something on a tall shelf while standing on a chair, and a second when her foot slipped getting out of the bathtub.  She had some mild head trauma, but felt fine afterwards and did not notice her stroke-like symptoms until this morning.    ED Course: Blood pressure elevated 200/111, other VSS.  Labs wnl.  CT head demonstrated a small area of hypoattenuation in the deep white matter of the right frontal lobe.  Neurology has been consulted.  The ER MD administered two home blood pressure medications before I arrived to see the patient.    Review of Systems:  General:  Denies fevers, chills, weight loss or gain HEENT:  Denies changes to hearing and vision, rhinorrhea, sinus congestion, sore throat CV:  Denies chest pain and palpitations, lower  extremity edema.  PULM:  Denies SOB, wheezing, cough.   GI:  Denies nausea, vomiting, constipation, diarrhea.   GU:  Denies dysuria, frequency, urgency ENDO:  Denies polyuria, polydipsia.   HEME:  Denies hematemesis, blood in stools, melena, abnormal bruising or bleeding.  LYMPH:  Denies lymphadenopathy.   MSK:  Chronic arthralgias, myalgias.   DERM:  Denies skin rash or ulcer.   NEURO:  Per HPI PSYCH:  Denies anxiety and depression.    Past Medical History:  Diagnosis Date  . Abrasion or friction burn of foot and toe(s), with infection   . Allergy    soy  . Anxiety   . 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 Degenerative joint disease    Generalized  . Hyperlipidemia   . Hypertension   . Myocardial infarction ([place])   . Sickle cell trait ([place])   . Tobacco abuse April, 2011   Continued    Past Surgical History:  Procedure Laterality Date  . ANGIOPLASTY  2006   x3 stents  . DILATION AND CURETTAGE OF UTERUS    . FOOT SURGERY  2000  . LITHOTRIPSY  2008   left kidney stone and also ureteral stent placed     reports that she has been smoking Cigarettes.  She has never used smokeless tobacco. She reports that she does not drink alcohol or use drugs.  Allergies  Allergen Reactions  . Shrimp [Shellfish Allergy] Nausea And Vomiting  . Adhesive [Tape] Other (See Comments)    Unknown  . Soy Allergy Nausea And Vomiting    Family History  Problem Relation Age of Onset  . Coronary artery disease Mother   . Coronary artery disease Father     Prior to Admission medications   Medication Sig Start Date End Date Taking? Authorizing Provider  aspirin 81 MG EC tablet Take 81 mg by mouth 2 (two) times daily.     Yes Historical Provider, MD  atorvastatin (LIPITOR) 80 MG tablet TAKE 1 TABLET (80 MG TOTAL) BY MOUTH DAILY. Patient not taking: Reported on 05/19/2016 06/18/13   Biagio Borg, MD    enalapril (VASOTEC) 10 MG tablet Take 1 tablet (10 mg total) by mouth 2 (two) times daily. Patient not taking: Reported on 05/19/2016 04/02/11 04/01/12  Biagio Borg, MD  escitalopram (LEXAPRO) 10 MG tablet Take 1 tablet (10 mg total) by mouth daily. Patient not taking: Reported on 05/19/2016 05/06/12 08/04/12  Biagio Borg, MD  ezetimibe (ZETIA) 10 MG tablet Take 1 tablet (10 mg total) by mouth daily. Patient not taking: Reported on 05/19/2016 05/06/12 05/06/13  Biagio Borg, MD  irbesartan (AVAPRO) 300 MG tablet Take 1 tablet (300 mg total) by mouth daily. Patient not taking: Reported on 05/19/2016 11/16/12   Biagio Borg, MD  meloxicam (MOBIC) 7.5 MG tablet Take 1 tablet (7.5 mg total) by mouth 2 (two) times daily as needed for pain. Patient not taking: Reported on 05/19/2016 11/16/12   Biagio Borg, MD  metoprolol (LOPRESSOR) 50 MG tablet Take 1 tablet (50 mg total) by mouth 2 (two) times daily. Patient not taking: Reported on 05/19/2016 11/16/12   Biagio Borg, MD  nitroGLYCERIN (NITROLINGUAL) 0.4 MG/SPRAY spray Place 1 spray under the tongue every 5 (five) minutes as needed. May repeat up to 3 doses. 05/06/12   Biagio Borg, MD    Physical Exam: Vitals:   05/19/16 1615 05/19/16 1630 05/19/16 1639 05/19/16 1645  BP: (!) 175/101 (!) 199/128  190/98  Pulse: 64 72  67  Resp: 19 (!) 27  17  Temp:   98 F (36.7 C)   TempSrc:      SpO2: 99% 98%  98%      Constitutional: NAD, calm, comfortable, mild left facial droop Eyes: PERRL, lids and conjunctivae normal ENMT: Mucous membranes are moist. Posterior pharynx with erythematous spots and possible white plaques.  Normal dentition.  Neck: normal, supple, no masses, no thyromegaly Respiratory:  Rhonchorous, no wheeze or rales.  Normal respiratory effort. No accessory muscle use.  Cardiovascular: Regular rate and rhythm, no murmurs / rubs / gallops. No extremity edema. 2+ pedal pulses. No carotid bruits.  Abdomen: no tenderness, no masses palpated. No  hepatosplenomegaly. Bowel sounds positive.  Musculoskeletal: no clubbing / cyanosis. No joint deformity upper and lower extremities. Good ROM, no contractures. Normal muscle tone.  Skin: no rashes, lesions, ulcers. No induration Neurologic: left facial droop, left lower quadrant vision loss. Sensation intact. Strength 4/5 left upper and left lower extremity with dysmetria of the left upper and left lower extremity on finger to nose and heel-to-shin respectively.  Right extremities were 5/5 strength.  Slurred speech obvious, but no word-finding difficulties.  Answered questions quickly and appropriately.   Psychiatric: Normal judgment and insight. Alert and oriented x 3. Normal mood.   Labs on Admission: I have personally reviewed following labs and imaging  studies  CBC:  Recent Labs Lab 05/19/16 1444 05/19/16 1610  WBC 9.3  --   NEUTROABS 4.8  --   HGB 14.6 16.0*  HCT 42.6 47.0*  MCV 86.4  --   PLT 157  --    Basic Metabolic Panel:  Recent Labs Lab 05/19/16 1444 05/19/16 1610  NA 136 139  K 3.9 4.1  CL 108 103  CO2 25  --   GLUCOSE 111* 93  BUN 9 9  CREATININE 0.83 0.70  CALCIUM 10.0  --    GFR: CrCl cannot be calculated (Unknown ideal weight.). Liver Function Tests:  Recent Labs Lab 05/19/16 1444  AST 18  ALT 11*  ALKPHOS 101  BILITOT 0.5  PROT 6.6  ALBUMIN 3.7   No results for input(s): LIPASE, AMYLASE in the last 168 hours. No results for input(s): AMMONIA in the last 168 hours. Coagulation Profile:  Recent Labs Lab 05/19/16 1444  INR 0.95   Cardiac Enzymes: No results for input(s): CKTOTAL, CKMB, CKMBINDEX, TROPONINI in the last 168 hours. BNP (last 3 results) No results for input(s): PROBNP in the last 8760 hours. HbA1C: No results for input(s): HGBA1C in the last 72 hours. CBG: No results for input(s): GLUCAP in the last 168 hours. Lipid Profile: No results for input(s): CHOL, HDL, LDLCALC, TRIG, CHOLHDL, LDLDIRECT in the last 72  hours. Thyroid Function Tests: No results for input(s): TSH, T4TOTAL, FREET4, T3FREE, THYROIDAB in the last 72 hours. Anemia Panel: No results for input(s): VITAMINB12, FOLATE, FERRITIN, TIBC, IRON, RETICCTPCT in the last 72 hours. Urine analysis:    Component Value Date/Time   COLORURINE LT. YELLOW 05/17/2013 1129   APPEARANCEUR CLEAR 05/17/2013 1129   LABSPEC 1.010 05/17/2013 1129   PHURINE 7.0 05/17/2013 1129   GLUCOSEU NEGATIVE 05/17/2013 1129   HGBUR TRACE-LYSED 05/17/2013 1129   BILIRUBINUR NEGATIVE 05/17/2013 1129   KETONESUR NEGATIVE 05/17/2013 1129   UROBILINOGEN 0.2 05/17/2013 1129   NITRITE NEGATIVE 05/17/2013 1129   LEUKOCYTESUR NEGATIVE 05/17/2013 1129   Sepsis Labs: @LABRCNTIP (procalcitonin:4,lacticidven:4) )No results found for this or any previous visit (from the past 240 hour(s)).   Radiological Exams on Admission: Ct Head Wo Contrast  Result Date: 05/19/2016 CLINICAL DATA:  Left-sided facial droop and weakness. Slight slurred speech. EXAM: CT HEAD WITHOUT CONTRAST TECHNIQUE: Contiguous axial images were obtained from the base of the skull through the vertex without intravenous contrast. COMPARISON:  None. FINDINGS: Brain: No evidence of acute hemorrhage, hydrocephalus, extra-axial collection or mass lesion/mass effect. There is a small area of hypoattenuation in the deep white matter of the right frontal lobe, bordering the right lateral ventricle, likely representing an age-indeterminate ischemic infarct. Vascular: No hyperdense vessel or unexpected calcification. Skull: No acute fractures. Sinuses/Orbits: No acute finding. Other: None. IMPRESSION: Small area of hypoattenuation in the deep white matter of the right frontal lobe, which likely represents an age-indeterminate ischemic infarct. Alternatively this may represent an area of demyelination. Mild brain parenchymal atrophy. These results were called by telephone at the time of interpretation on 05/19/2016 at 3:46 pm  to Dr. Audie Pinto, who verbally acknowledged these results. Electronically Signed   By: Fidela Salisbury M.D.   On: 05/19/2016 15:51    EKG: Independently reviewed. NSR  Assessment/Plan Active Problems:   Stroke ([place])   Probable stroke with slurred speech and left facial droop -  Telemetry  -  MRI brain/MRA head -  Carotid duplex -  ECHO -  Aspirin 325mg  daily -  Lipid panel    -  A1c -  PT/OT/SLP  -  Neurology assistance appreciated  CAD, patient is chest pain free -  ASA, high dose statin, beta blocker  Essential hypertension, permissive hypertension for now -  Will continue ARB and discontinue ACEI.  Dual-therapy is only indicated for end-stage heart failure at this time -  Resume metoprolol tomorrow  Hyperlipidemia -  Lipid panel -  Continue atorvastatin and zetia  Depression, stable, continue lexapro  Smoking, rare -  Counseled cessation  DVT prophylaxis: lovenox  Code Status: full Family Communication: patient, her husband and sister-in-law and neice Disposition Plan:  24-48 hours to probably home depending on gait stability with PT Consults called: Neurology has seen patient  Admission status: telemetry, obs   Janece Canterbury MD [facility] Pager [phone]  If 7PM-7AM, please contact night-coverage [email or web address] Password [facility]  05/19/2016, 5:52 PM

## 2016-05-19 NOTE — ED Provider Notes (Signed)
[facility] DEPT Provider Note   CSN: [id number] Arrival date & time: 05/19/16  1421     History   Chief Complaint Chief Complaint  Patient presents with  . Stroke Symptoms    HPI Elizabeth [name] is a 65 y.o. female.  HPI Per nursing notes "Pt states she woke up at 6am this morning and upon getting up noticed her left sided "felt a little heavy" Pts husband states he noticed her lip was drooping on the left side and has mild slurred speech. Pt states throughout the morning her gait became worse and while at church the vision in her left eye became darken and "felt like there was a black cloud over it." pt has decreased sensation of the left side. Pt also reports falling in the tub on Wednesday after loosing her balance. After she got up she did not think much of it.  Her balance was fine.  She had no weakness. Pt states she fell on Monday when she fell out of her chair in the kitchen and did hit her head on the floor.  SHe was standing on a chiar then and did not think she felt weak or have any trouble after the fall.  Pt doe not feel her arm or legs are weak.  She feels like her balance may be a little off today. Pt ran out of her BP medications two weeks ago Past Medical History:  Diagnosis Date  . Abrasion or friction burn of foot and toe(s), with infection   . Allergy    soy  . Anxiety   . 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 Degenerative joint disease    Generalized  . Hyperlipidemia   . Hypertension   . Myocardial infarction ([place])   . Sickle cell trait ([place])   . Tobacco abuse April, 2011   Continued    Patient Active Problem List   Diagnosis Date Noted  . Cervical radiculitis 05/17/2013  . Right shoulder pain 11/16/2012  . Anxiety 04/02/2011  . Preventative health care 03/31/2011  . CAD (coronary artery disease)   . Hypertension   . Hyperlipidemia   . Degenerative  joint disease   . Tobacco abuse 12/22/2009    Past Surgical History:  Procedure Laterality Date  . ANGIOPLASTY  2006   x3 stents  . DILATION AND CURETTAGE OF UTERUS    . FOOT SURGERY  2000  . LITHOTRIPSY  2008   left kidney stone and also ureteral stent placed    OB History    No data available       Home Medications    Prior to Admission medications   Medication Sig Start Date End Date Taking? Authorizing Provider  aspirin 81 MG EC tablet Take 81 mg by mouth 2 (two) times daily.     Yes Historical Provider, MD  atorvastatin (LIPITOR) 80 MG tablet TAKE 1 TABLET (80 MG TOTAL) BY MOUTH DAILY. Patient not taking: Reported on 05/19/2016 06/18/13   Biagio Borg, MD  enalapril (VASOTEC) 10 MG tablet Take 1 tablet (10 mg total) by mouth 2 (two) times daily. Patient not taking: Reported on 05/19/2016 04/02/11 04/01/12  Biagio Borg, MD  escitalopram (LEXAPRO) 10 MG tablet Take 1 tablet (10 mg total) by mouth daily. Patient not taking: Reported on 05/19/2016 05/06/12 08/04/12  Biagio Borg, MD  ezetimibe (ZETIA) 10 MG tablet Take 1  tablet (10 mg total) by mouth daily. Patient not taking: Reported on 05/19/2016 05/06/12 05/06/13  Biagio Borg, MD  irbesartan (AVAPRO) 300 MG tablet Take 1 tablet (300 mg total) by mouth daily. Patient not taking: Reported on 05/19/2016 11/16/12   Biagio Borg, MD  meloxicam (MOBIC) 7.5 MG tablet Take 1 tablet (7.5 mg total) by mouth 2 (two) times daily as needed for pain. Patient not taking: Reported on 05/19/2016 11/16/12   Biagio Borg, MD  metoprolol (LOPRESSOR) 50 MG tablet Take 1 tablet (50 mg total) by mouth 2 (two) times daily. Patient not taking: Reported on 05/19/2016 11/16/12   Biagio Borg, MD  nitroGLYCERIN (NITROLINGUAL) 0.4 MG/SPRAY spray Place 1 spray under the tongue every 5 (five) minutes as needed. May repeat up to 3 doses. 05/06/12   Biagio Borg, MD    Family History Family History  Problem Relation Age of Onset  . Coronary artery disease Mother     . Coronary artery disease Father     Social History Social History  Substance Use Topics  . Smoking status: Current Some Day Smoker    Types: Cigarettes  . Smokeless tobacco: Never Used     Comment: Ongoing since 04/2005  . Alcohol use No     Allergies   Shrimp [shellfish allergy]; Adhesive [tape]; and Soy allergy   Review of Systems Review of Systems  Constitutional: Negative for fever.  Cardiovascular: Negative for chest pain.  Gastrointestinal: Negative for vomiting.  Genitourinary: Negative for dysuria.  Musculoskeletal: Negative for back pain.  Neurological: Positive for headaches (mild). Negative for tremors, weakness and light-headedness.  Psychiatric/Behavioral: Negative for behavioral problems and confusion.  All other systems reviewed and are negative.    Physical Exam Updated Vital Signs BP (!) 175/101   Pulse 64   Temp 98.3 F (36.8 C) (Oral)   Resp 19   SpO2 99%   Physical Exam  Constitutional: She is oriented to person, place, and time. She appears well-developed and well-nourished. No distress.  HENT:  Head: Normocephalic and atraumatic.  Right Ear: External ear normal.  Left Ear: External ear normal.  Mouth/Throat: Oropharynx is clear and moist.  Eyes: Conjunctivae are normal. Right eye exhibits no discharge. Left eye exhibits no discharge. No scleral icterus.  Neck: Neck supple. No tracheal deviation present.  Cardiovascular: Normal rate, regular rhythm and intact distal pulses.   Pulmonary/Chest: Effort normal and breath sounds normal. No stridor. No respiratory distress. She has no wheezes. She has no rales.  Abdominal: Soft. Bowel sounds are normal. She exhibits no distension. There is no tenderness. There is no rebound and no guarding.  Musculoskeletal: She exhibits no edema or tenderness.  Neurological: She is alert and oriented to person, place, and time. She has normal strength. No cranial nerve deficit (left  facial droop, forehead  spared, extraocular movements intact, tongue midline  ) or sensory deficit. She exhibits normal muscle tone. She displays no seizure activity. Coordination normal.  No pronator drift bilateral upper extrem, able to hold both legs off bed for 5 seconds, sensation intact in all extremities, no visual field cuts, no left or right sided neglect, normal finger-nose exam bilaterally, no nystagmus noted   Skin: Skin is warm and dry. No rash noted.  Psychiatric: She has a normal mood and affect.  Nursing note and vitals reviewed.    ED Treatments / Results  Labs (all labs ordered are listed, but only abnormal results are displayed) Labs Reviewed  COMPREHENSIVE METABOLIC  PANEL - Abnormal; Notable for the following:       Result Value   Glucose, Bld 111 (*)    ALT 11 (*)    Anion gap 3 (*)    All other components within normal limits  I-STAT CHEM 8, ED - Abnormal; Notable for the following:    Calcium, Ion 1.26 (*)    Hemoglobin 16.0 (*)    HCT 47.0 (*)    All other components within normal limits  PROTIME-INR  APTT  CBC  DIFFERENTIAL  I-STAT TROPOININ, ED    EKG  EKG Interpretation None       Radiology Ct Head Wo Contrast  Result Date: 05/19/2016 CLINICAL DATA:  Left-sided facial droop and weakness. Slight slurred speech. EXAM: CT HEAD WITHOUT CONTRAST TECHNIQUE: Contiguous axial images were obtained from the base of the skull through the vertex without intravenous contrast. COMPARISON:  None. FINDINGS: Brain: No evidence of acute hemorrhage, hydrocephalus, extra-axial collection or mass lesion/mass effect. There is a small area of hypoattenuation in the deep white matter of the right frontal lobe, bordering the right lateral ventricle, likely representing an age-indeterminate ischemic infarct. Vascular: No hyperdense vessel or unexpected calcification. Skull: No acute fractures. Sinuses/Orbits: No acute finding. Other: None. IMPRESSION: Small area of hypoattenuation in the deep white  matter of the right frontal lobe, which likely represents an age-indeterminate ischemic infarct. Alternatively this may represent an area of demyelination. Mild brain parenchymal atrophy. These results were called by telephone at the time of interpretation on 05/19/2016 at 3:46 pm to Dr. Audie Pinto, who verbally acknowledged these results. Electronically Signed   By: Fidela Salisbury M.D.   On: 05/19/2016 15:51    Procedures Procedures (including critical care time)  Medications Ordered in ED none   Initial Impression / Assessment and Plan / ED Course  I have reviewed the triage vital signs and the nursing notes.  Pertinent labs & imaging results that were available during my care of the patient were reviewed by me and considered in my medical decision making (see chart for details).  Clinical Course  Comment By Time  Discussed case with Dr Tasia Catchings.  Will consult on patient. Dorie Rank, MD 08/27 1648  Home BP meds ordered. Dorie Rank, MD 08/27 1700    Pt presented to the ED with sx concerning for new stroke.  CT scan with abnormal findings, possible stroke. Outside of acute intervention/TPA window based on Sx onset.  Admitted for further treatment.  Final Clinical Impressions(s) / ED Diagnoses   Final diagnoses:  Stroke-like symptoms      Dorie Rank, MD 05/20/16 1454

## 2016-05-19 NOTE — Progress Notes (Signed)
Pt arrived on unit from ED. Pt alert and oriented to unit. MD notified about diet order and MD asked diet order to be changed to Heart Healthy diet.

## 2016-05-19 NOTE — ED Triage Notes (Signed)
Pt states she woke up at 6am this morning and upon getting up noticed her left sided "felt a little heavy" Pts husband states he noticed her lip was drooping on the left side and has mild slurred speech. Pt states throughout the morning her gait became worse and while at church the vision in her left eye became darken and "felt like there was a black cloud over it." pt has decreased sensation of the left side. Pt also reports falling in the tub on Wednesday after loosing her balance. Pt states she also fell on Monday when she fell out of her chair in the kitchen and did hit her head on the floor.

## 2016-05-20 ENCOUNTER — Observation Stay (HOSPITAL_BASED_OUTPATIENT_CLINIC_OR_DEPARTMENT_OTHER): Payer: 59

## 2016-05-20 ENCOUNTER — Observation Stay (HOSPITAL_COMMUNITY): Payer: 59

## 2016-05-20 ENCOUNTER — Encounter (HOSPITAL_COMMUNITY): Payer: Self-pay | Admitting: Radiology

## 2016-05-20 DIAGNOSIS — I639 Cerebral infarction, unspecified: Secondary | ICD-10-CM

## 2016-05-20 DIAGNOSIS — R471 Dysarthria and anarthria: Secondary | ICD-10-CM | POA: Diagnosis present

## 2016-05-20 DIAGNOSIS — E785 Hyperlipidemia, unspecified: Secondary | ICD-10-CM | POA: Diagnosis not present

## 2016-05-20 DIAGNOSIS — H5462 Unqualified visual loss, left eye, normal vision right eye: Secondary | ICD-10-CM | POA: Diagnosis present

## 2016-05-20 DIAGNOSIS — Z8673 Personal history of transient ischemic attack (TIA), and cerebral infarction without residual deficits: Secondary | ICD-10-CM | POA: Diagnosis not present

## 2016-05-20 DIAGNOSIS — I69322 Dysarthria following cerebral infarction: Secondary | ICD-10-CM

## 2016-05-20 DIAGNOSIS — Z7982 Long term (current) use of aspirin: Secondary | ICD-10-CM | POA: Diagnosis not present

## 2016-05-20 DIAGNOSIS — I340 Nonrheumatic mitral (valve) insufficiency: Secondary | ICD-10-CM | POA: Diagnosis not present

## 2016-05-20 DIAGNOSIS — Z955 Presence of coronary angioplasty implant and graft: Secondary | ICD-10-CM | POA: Diagnosis not present

## 2016-05-20 DIAGNOSIS — I2510 Atherosclerotic heart disease of native coronary artery without angina pectoris: Secondary | ICD-10-CM | POA: Diagnosis not present

## 2016-05-20 DIAGNOSIS — R2990 Unspecified symptoms and signs involving the nervous system: Secondary | ICD-10-CM | POA: Diagnosis not present

## 2016-05-20 DIAGNOSIS — I6349 Cerebral infarction due to embolism of other cerebral artery: Secondary | ICD-10-CM | POA: Diagnosis present

## 2016-05-20 DIAGNOSIS — I10 Essential (primary) hypertension: Secondary | ICD-10-CM

## 2016-05-20 DIAGNOSIS — Z720 Tobacco use: Secondary | ICD-10-CM

## 2016-05-20 DIAGNOSIS — M1990 Unspecified osteoarthritis, unspecified site: Secondary | ICD-10-CM | POA: Diagnosis present

## 2016-05-20 DIAGNOSIS — F329 Major depressive disorder, single episode, unspecified: Secondary | ICD-10-CM | POA: Diagnosis not present

## 2016-05-20 DIAGNOSIS — I63232 Cerebral infarction due to unspecified occlusion or stenosis of left carotid arteries: Secondary | ICD-10-CM | POA: Diagnosis not present

## 2016-05-20 DIAGNOSIS — R29810 Facial weakness: Secondary | ICD-10-CM | POA: Diagnosis not present

## 2016-05-20 DIAGNOSIS — G8194 Hemiplegia, unspecified affecting left nondominant side: Secondary | ICD-10-CM | POA: Diagnosis present

## 2016-05-20 DIAGNOSIS — R4781 Slurred speech: Secondary | ICD-10-CM | POA: Diagnosis present

## 2016-05-20 DIAGNOSIS — Z888 Allergy status to other drugs, medicaments and biological substances status: Secondary | ICD-10-CM | POA: Diagnosis not present

## 2016-05-20 DIAGNOSIS — I6789 Other cerebrovascular disease: Secondary | ICD-10-CM | POA: Diagnosis not present

## 2016-05-20 DIAGNOSIS — F4322 Adjustment disorder with anxiety: Secondary | ICD-10-CM | POA: Diagnosis present

## 2016-05-20 DIAGNOSIS — I252 Old myocardial infarction: Secondary | ICD-10-CM | POA: Diagnosis not present

## 2016-05-20 DIAGNOSIS — F17210 Nicotine dependence, cigarettes, uncomplicated: Secondary | ICD-10-CM | POA: Diagnosis present

## 2016-05-20 DIAGNOSIS — D573 Sickle-cell trait: Secondary | ICD-10-CM | POA: Diagnosis present

## 2016-05-20 DIAGNOSIS — Z91048 Other nonmedicinal substance allergy status: Secondary | ICD-10-CM | POA: Diagnosis not present

## 2016-05-20 DIAGNOSIS — Z91013 Allergy to seafood: Secondary | ICD-10-CM | POA: Diagnosis not present

## 2016-05-20 DIAGNOSIS — Z8249 Family history of ischemic heart disease and other diseases of the circulatory system: Secondary | ICD-10-CM | POA: Diagnosis not present

## 2016-05-20 LAB — LIPID PANEL
CHOL/HDL RATIO: 4.6 ratio
Cholesterol: 248 mg/dL — ABNORMAL HIGH (ref 0–200)
HDL: 54 mg/dL (ref >40)
LDL CALC: 165 mg/dL — AB (ref 0–99)
Triglycerides: 145 mg/dL (ref <150)
VLDL: 29 mg/dL (ref 0–40)

## 2016-05-20 LAB — RAPID URINE DRUG SCREEN, HOSP PERFORMED
Amphetamines: NOT DETECTED
Barbiturates: NOT DETECTED
Benzodiazepines: NOT DETECTED
COCAINE: NOT DETECTED
OPIATES: NOT DETECTED
TETRAHYDROCANNABINOL: NOT DETECTED

## 2016-05-20 MED ORDER — AMLODIPINE BESYLATE 5 MG PO TABS
5.0000 mg | ORAL_TABLET | Freq: Every day | ORAL | Status: DC
  Administered 2016-05-20 – 2016-05-22 (×3): 5 mg via ORAL
  Filled 2016-05-20 (×3): qty 1

## 2016-05-20 MED ORDER — IOPAMIDOL (ISOVUE-370) INJECTION 76%
INTRAVENOUS | Status: AC
  Administered 2016-05-20: 50 mL
  Filled 2016-05-20: qty 50

## 2016-05-20 NOTE — Care Management Note (Signed)
Case Management Note  Patient Details  Name: Elizabeth [name] MRN: [id number] Date of Birth: 08-15-1951  Subjective/Objective:     Pt admitted with CVA.  She is from home with her spouse.              Action/Plan: PT/OT recommendations are for CIR. CM following for discharge disposition.   Expected Discharge Date:                  Expected Discharge Plan:  [place]  In-House Referral:     Discharge planning Services     Post Acute Care Choice:    Choice offered to:     DME Arranged:    DME Agency:     HH Arranged:    [facility] Agency:     Status of Service:  In process, will continue to follow  If discussed at Long Length of Stay Meetings, dates discussed:    Additional Comments:  Pollie Friar, RN 05/20/2016, 1:55 PM

## 2016-05-20 NOTE — Progress Notes (Signed)
Transcranial Doppler with Monitoring has been completed. 30 minutes of monitoring revealed no HITS.   Lower extremity venous duplex: No evidence of DVT or baker's cyst.   Landry Mellow, RDMS, RVT 05/20/2016

## 2016-05-20 NOTE — Evaluation (Signed)
Occupational Therapy Evaluation Patient Details Name: Elizabeth [name] MRN: [id number] DOB: Jun 02, 1951 Today's Date: 05/20/2016    History of Present Illness Elizabeth Everettis a 65 y.o.femalewith medical history significant of CAD s/p MI x 3 with three DES, hypertension, hyperlipidemia, anxiety, arthritis, sickle cell trait, active smoking who presents with left facial droop and slurred speech. MRI revealed severl small acute infarct in anterior paramedian superior pons, R posterior limb of internal capsule, R mid corona radiata, and L parietal operculum.   Clinical Impression  Past Medical History:  Diagnosis Date  . Abrasion or friction burn of foot and toe(s), with infection   . Allergy    soy  . Anxiety   . 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 Degenerative joint disease    Generalized  . Hyperlipidemia   . Hypertension   . Myocardial infarction ([place])   . Sickle cell trait ([place])   . Tobacco abuse April, 2011   Continued    PT admitted with R pon, R posterior limb of internal capsule R mid corona radiata and L parietal operculum infarct. Pt currently with functional limitiations due to the deficits listed below (see OT problem list). PTA was independent with all adls.Pt will benefit from skilled OT to increase their independence and safety with adls and balance to allow discharge CIR.     Follow Up Recommendations  CIR    Equipment Recommendations  Other (comment) (defer to CIR)    Recommendations for Other Services Rehab consult     Precautions / Restrictions Precautions Precautions: Fall Precaution Comments: L visual field Restrictions Weight Bearing Restrictions: No      Mobility Bed Mobility               General bed mobility comments: on eob on arrival. pt transferred to chair  Transfers Overall transfer level: Needs assistance   Transfers: Sit  to/from Stand Sit to Stand: Min guard         General transfer comment: pt holding onto environmental supports    Balance Overall balance assessment: Needs assistance Sitting-balance support: No upper extremity supported;Feet supported Sitting balance-Leahy Scale: Good     Standing balance support: Single extremity supported;During functional activity Standing balance-Leahy Scale: Poor                              ADL Overall ADL's : Needs assistance/impaired Eating/Feeding: Set up;Sitting Eating/Feeding Details (indicate cue type and reason): cues for L side  Grooming: Wash/dry face;Oral care;Set up                   Toilet Transfer: Min guard [employer] technical officer Details (indicate cue type and reason): pt reaching for environmental supports on L side. pt with limp to gait pattern with transfers           General ADL Comments: Pt on arrival reports "there is a black spot in my vision over here" pt reports "its like a dirt I can see it but its like its dirty" pt with testing has greater deficits with white surfaces. Contrast on cell phone turned to more of a grey color to help with reading cell phone     Vision Vision Assessment?: Yes Eye Alignment: Within Functional Limits Ocular Range of Motion: Within Functional Limits Alignment/Gaze Preference: Within Defined Limits Tracking/Visual Pursuits: Impaired - to be further  tested in functional context Visual Fields: Left visual field deficit Additional Comments: Pt provided clock drawing with error making hands to clock but self corrected. pt x3 with worksheets missing L perpherial field and scanning with head turn to self correct. Pt demonstrates L visual filed peripheral with peripheral field testing.    Perception     Praxis      Pertinent Vitals/Pain Pain Assessment: No/denies pain     Hand Dominance Left   Extremity/Trunk Assessment Upper Extremity Assessment Upper Extremity Assessment: LUE  deficits/detail LUE Deficits / Details: pt is L handed and able to hold pencil to draw and complete worksheets   Lower Extremity Assessment Lower Extremity Assessment: Defer to PT evaluation LLE Deficits / Details: grossly 4/5   Cervical / Trunk Assessment Cervical / Trunk Assessment: Normal   Communication Communication Communication: Expressive difficulties   Cognition Arousal/Alertness: Awake/alert Behavior During Therapy: WFL for tasks assessed/performed Overall Cognitive Status: Impaired/Different from baseline Area of Impairment: Safety/judgement         Safety/Judgement: Decreased awareness of deficits;Decreased awareness of safety     General Comments: L inattention   General Comments       Exercises       Shoulder Instructions      Home Living Family/patient expects to be discharged to:: Private residence Living Arrangements: Spouse/significant other Available Help at Discharge: Family;Available PRN/intermittently Type of Home: House Home Access: Stairs to enter [employer] of Steps: 3 Entrance Stairs-Rails: Right Home Layout: One level     Bathroom Shower/Tub: Tub/shower unit;Walk-in shower         Home Equipment: Kasandra Knudsen - single point   Additional Comments: watches 77 yo granddaughter      Prior Functioning/Environment Level of Independence: Independent        Comments: used cane occasionally when L knee would hurt, watches kids    OT Diagnosis: Generalized weakness;Disturbance of vision;Hemiplegia dominant side   OT Problem List: Decreased strength;Decreased activity tolerance;Impaired balance (sitting and/or standing);Impaired vision/perception;Decreased knowledge of use of DME or AE;Decreased knowledge of precautions   OT Treatment/Interventions: Self-care/ADL training;Therapeutic exercise;DME and/or AE instruction;Therapeutic activities;Cognitive remediation/compensation;Visual/perceptual remediation/compensation;Patient/family  education;Balance training    OT Goals(Current goals can be found in the care plan section) Acute Rehab OT Goals Patient Stated Goal: to go home and watch granddaughter OT Goal Formulation: With patient Time For Goal Achievement: 06/03/16 Potential to Achieve Goals: Good  OT Frequency: Min 3X/week   Barriers to D/C:            Co-evaluation              End of Session Nurse Communication: Mobility status;Precautions  Activity Tolerance: Patient tolerated treatment well Patient left: Other (comment) (with transport going to testing)   Time: [id number] OT Time Calculation (min): 20 min Charges:  OT General Charges $OT Visit: 1 Procedure OT Evaluation $OT Eval Moderate Complexity: 1 Procedure G-Codes:    Parke Poisson B 07-Jun-2016, 9:31 AM   Jeri Modena   OTR/L Pager: [phone] Office: [phone] .

## 2016-05-20 NOTE — Evaluation (Signed)
Speech Language Pathology Evaluation Patient Details Name: Elizabeth [name] MRN: [id number] DOB: 02/21/1951 Today's Date: 05/20/2016 Time: [id number] SLP Time Calculation (min) (ACUTE ONLY): 20 min  Problem List:  Patient Active Problem List   Diagnosis Date Noted  . Slurred speech   . Dysarthria, post-stroke   . Sickle cell trait ([place])   . Acute embolic stroke ([place]) [id number]  . Depression   . Cervical radiculitis 05/17/2013  . Right shoulder pain 11/16/2012  . Anxiety 04/02/2011  . Preventative health care 03/31/2011  . CAD (coronary artery disease)   . Benign essential HTN   . Hyperlipidemia   . Degenerative joint disease   . Tobacco abuse 12/22/2009   Past Medical History:  Past Medical History:  Diagnosis Date  . Abrasion or friction burn of foot and toe(s), with infection   . Allergy    soy  . Anxiety   . 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 Degenerative joint disease    Generalized  . Hyperlipidemia   . Hypertension   . Myocardial infarction ([place])   . Sickle cell trait ([place])   . Tobacco abuse April, 2011   Continued   Past Surgical History:  Past Surgical History:  Procedure Laterality Date  . ANGIOPLASTY  2006   x3 stents  . DILATION AND CURETTAGE OF UTERUS    . FOOT SURGERY  2000  . LITHOTRIPSY  2008   left kidney stone and also ureteral stent placed   HPI:  65 y.o. female with medical history significant of CAD s/p MI x 3 with three DES, hypertension, hyperlipidemia, anxiety, arthritis, sickle cell trait, active smoking who presents with left facial droop and slurred speech. MRI revealed severl small acute infarct in anterior paramedian superior pons, R posterior limb of internal capsule, R mid corona radiata, and L parietal operculum.   Assessment / Plan / Recommendation Clinical Impression  Pt has moderate impairments with working memory and retrieval of  new information. This, along with reduced sustained attention, results in difficulty completing multi-step instructions. Speech is mild to moderately dysarthric due primarily to imprecise speech and fast rate. She has limited insight as to how her deficits impact her functionally. Mod cues were provided for safety due to pt impulsivity in getting up from her chair. When discussing d/c planning, she becomes very worried about getting specific types of clothing from home and has difficulty being redirected to the need for therapy. Recommend CIR level therapy at d/c to maximize functional communication, cognition, and safety.    SLP Assessment  Patient needs continued Speech Lanaguage Pathology Services    Follow Up Recommendations  Inpatient Rehab    Frequency and Duration min 2x/week  2 weeks      SLP Evaluation Prior Functioning  Cognitive/Linguistic Baseline: Information not available Type of Home: House  Lives With: Spouse   Cognition  Overall Cognitive Status: Impaired/Different from baseline Arousal/Alertness: Awake/alert Orientation Level: Oriented X4 Attention: Sustained Sustained Attention: Impaired Sustained Attention Impairment: Verbal basic Memory: Impaired Memory Impairment: Retrieval deficit;Decreased recall of new information Awareness: Impaired Awareness Impairment: Emergent impairment;Anticipatory impairment Problem Solving: Impaired Problem Solving Impairment: Verbal basic Safety/Judgment: Impaired    Comprehension  Auditory Comprehension Overall Auditory Comprehension: Impaired Commands: Impaired One Step Basic Commands: 75-100% accurate Two Step Basic Commands: 75-100% accurate Multistep Basic Commands: 25-49% accurate    Expression Expression Primary Mode of Expression: Verbal Verbal Expression Overall  Verbal Expression: Appears within functional limits for tasks assessed   Oral / Motor  Motor Speech Overall Motor Speech: Impaired Respiration: Within  functional limits Phonation: Normal Resonance: Hypernasality Articulation: Impaired Level of Impairment: Conversation Intelligibility: Intelligibility reduced Conversation: 75-100% accurate   GO          Functional Assessment Tool Used: skilled clinical judgment Functional Limitations: Memory Memory Current Status [id number]): At least 60 percent but less than 80 percent impaired, limited or restricted Memory Goal Status [id number]): At least 40 percent but less than 60 percent impaired, limited or restricted         Elizabeth [name] 05/20/2016, 5:12 PM  Elizabeth [name], M.A. CCC-SLP [phone]

## 2016-05-20 NOTE — Progress Notes (Signed)
Rehab Admissions Coordinator Note:  Patient was screened by Cleatrice Burke for appropriateness for an Inpatient Acute Rehab Consult per PT and OT recommendations.   At this time, we are recommending Inpatient Rehab consult.  Cleatrice Burke 05/20/2016, 12:04 PM  I can be reached at [phone].

## 2016-05-20 NOTE — Progress Notes (Addendum)
PROGRESS NOTE  Elizabeth [name]  [id number] DOB: Jun 15, 1951 DOA: 05/19/2016 PCP: Cathlean Cower, MD  Brief Narrative:   Elizabeth [name] is a 65 y.o. 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 She had not seen her PCP since 2014 and ran out of her blood pressure medications two weeks prior to admission.  In addition to the drooping of the left side of her face and slurred speech, she also noticed decreased coordination of her left hand and left leg.  Her blood pressure was elevated 200/111, other VSS.  CT head demonstrated a small area of hypoattenuation in the deep white matter of the right frontal lobe.  Neurology was consulted.  She was outside the window for tPA.  MRI demonstrated numerous acute, subacute, and chronic strokes in bilateral hemispheres suggestive of embolic disease.    Assessment & Plan:   Principal Problem:   Acute embolic stroke [facility]) Active Problems:   CAD (coronary artery disease)   Essential hypertension   Hyperlipidemia   Tobacco abuse   Acute, subacute, and chronic strokes suggestive of embolic disease.  Persistent slurred speech and left facial droop.  High suspicion for a-fib even though it has not been captured on telemetry.   -  MRI brain/MRA head:  Numerous strokes in bilateral hemispheres -  Telemetry:  NSR, no evidence of a-fib > consider LOOP recorder tomorrow  -  CT angio neck:  No carotid stenosis or dissection.  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   -  A1c:  pending -  PT/OT/SLP:  Inpatient rehabilitation recommended. Consult placed -  Neurology assistance appreciated  CAD, patient is chest pain free -  ASA, high dose statin, beta blocker  Essential  hypertension, will start gradually reducing BP -  Will continue ARB and discontinue ACEI.  Dual-therapy is only indicated for end-stage heart failure at this time -  Resume metoprolol -  Add norvasc  Hyperlipidemia, goal LDL < 70.  LDL 165 despite high dose statin and zetia.  -   Referral to cardiology for PCSK9 ANTIBODIES -  Continue atorvastatin and zetia  Depression, stable, continue lexapro  Smoking, rare -  Counseled cessation  DVT prophylaxis: lovenox  Code Status: full Family Communication: patient, her husband and sister-in-law and niece Disposition Plan:   possible to CIR after TEE tomorrow  Consultants:   Neurology  Procedures:  none  Antimicrobials:   none    Subjective:  Ongoing slurred speech and facial droop.  Has not noticed any improvement in her left hand and left leg coordination  Objective: Vitals:   05/20/16 0230 05/20/16 0430 05/20/16 0628 05/20/16 0858  BP: (!) 164/90 (!) 168/88 (!) 162/78 (!) 167/82  Pulse:    61  Resp: 20 20 20 16   Temp: 98.6 F (37 C) 98 F (36.7 C) 98.2 F (36.8 C) 97.8 F (36.6 C)  TempSrc: Oral Oral Oral Oral  SpO2: 98% 99% 98% 94%  Weight:      Height:        Intake/Output Summary (Last 24 hours) at 05/20/16 1342 Last data filed at 05/20/16 [id number]  Gross per 24 hour  Intake  360 ml  Output                0 ml  Net              360 ml   Filed Weights   05/19/16 2030  Weight: 61.2 kg (134 lb 14.7 oz)    Examination:  General exam:  Adult female.  No acute distress.  HEENT:  NCAT, MMM Respiratory system: Clear to auscultation bilaterally Cardiovascular system: Regular rate and rhythm, normal 99991111. 2/6 systolic murmur.  Warm extremities Gastrointestinal system: Normal active bowel sounds, soft, nondistended, nontender. MSK:  Normal tone and bulk, no lower extremity edema Neuro:  Left facial droop, strength 4/5 left upper and lower extremity and dysmetria of left upper and lower  extremities.  Right extremities with 5/5 strength.  Sensation intact to light touch throughout.    Data Reviewed: I have personally reviewed following labs and imaging studies  CBC:  Recent Labs Lab 05/19/16 1444 05/19/16 1610  WBC 9.3  --   NEUTROABS 4.8  --   HGB 14.6 16.0*  HCT 42.6 47.0*  MCV 86.4  --   PLT 157  --    Basic Metabolic Panel:  Recent Labs Lab 05/19/16 1444 05/19/16 1610  NA 136 139  K 3.9 4.1  CL 108 103  CO2 25  --   GLUCOSE 111* 93  BUN 9 9  CREATININE 0.83 0.70  CALCIUM 10.0  --    GFR: Estimated Creatinine Clearance: 58 mL/min (by C-G formula based on SCr of 0.8 mg/dL). Liver Function Tests:  Recent Labs Lab 05/19/16 1444  AST 18  ALT 11*  ALKPHOS 101  BILITOT 0.5  PROT 6.6  ALBUMIN 3.7   No results for input(s): LIPASE, AMYLASE in the last 168 hours. No results for input(s): AMMONIA in the last 168 hours. Coagulation Profile:  Recent Labs Lab 05/19/16 1444  INR 0.95   Cardiac Enzymes: No results for input(s): CKTOTAL, CKMB, CKMBINDEX, TROPONINI in the last 168 hours. BNP (last 3 results) No results for input(s): PROBNP in the last 8760 hours. HbA1C: No results for input(s): HGBA1C in the last 72 hours. CBG: No results for input(s): GLUCAP in the last 168 hours. Lipid Profile:  Recent Labs  05/20/16 0417  CHOL 248*  HDL 54  LDLCALC 165*  TRIG 145  CHOLHDL 4.6   Thyroid Function Tests: No results for input(s): TSH, T4TOTAL, FREET4, T3FREE, THYROIDAB in the last 72 hours. Anemia Panel: No results for input(s): VITAMINB12, FOLATE, FERRITIN, TIBC, IRON, RETICCTPCT in the last 72 hours. Urine analysis:    Component Value Date/Time   COLORURINE LT. YELLOW 05/17/2013 1129   APPEARANCEUR CLEAR 05/17/2013 1129   LABSPEC 1.010 05/17/2013 1129   PHURINE 7.0 05/17/2013 1129   GLUCOSEU NEGATIVE 05/17/2013 1129   HGBUR TRACE-LYSED 05/17/2013 1129   BILIRUBINUR NEGATIVE 05/17/2013 1129   KETONESUR NEGATIVE 05/17/2013  1129   UROBILINOGEN 0.2 05/17/2013 1129   NITRITE NEGATIVE 05/17/2013 1129   LEUKOCYTESUR NEGATIVE 05/17/2013 1129   Sepsis Labs: @LABRCNTIP (procalcitonin:4,lacticidven:4)  )No results found for this or any previous visit (from the past 240 hour(s)).    Radiology Studies: Ct Head Wo Contrast  Result Date: 05/19/2016 CLINICAL DATA:  Left-sided facial droop and weakness. Slight slurred speech. EXAM: CT HEAD WITHOUT CONTRAST TECHNIQUE: Contiguous axial images were obtained from the base of the skull through the vertex without intravenous contrast. COMPARISON:  None. FINDINGS: Brain: No evidence of acute hemorrhage, hydrocephalus, extra-axial collection or mass lesion/mass  effect. There is a small area of hypoattenuation in the deep white matter of the right frontal lobe, bordering the right lateral ventricle, likely representing an age-indeterminate ischemic infarct. Vascular: No hyperdense vessel or unexpected calcification. Skull: No acute fractures. Sinuses/Orbits: No acute finding. Other: None. IMPRESSION: Small area of hypoattenuation in the deep white matter of the right frontal lobe, which likely represents an age-indeterminate ischemic infarct. Alternatively this may represent an area of demyelination. Mild brain parenchymal atrophy. These results were called by telephone at the time of interpretation on 05/19/2016 at 3:46 pm to Dr. Audie Pinto, who verbally acknowledged these results. Electronically Signed   By: Fidela Salisbury M.D.   On: 05/19/2016 15:51   Ct Angio Neck W Or Wo Contrast  Result Date: 05/20/2016 CLINICAL DATA:  Multiple acute infarcts, with symptoms of LEFT facial droop and slurred speech new onset 05/19/2016. EXAM: CT ANGIOGRAPHY NECK TECHNIQUE: Multidetector CT imaging of the neck was performed using the standard protocol during bolus administration of intravenous contrast. Multiplanar CT image reconstructions and MIPs were obtained to evaluate the vascular anatomy. Carotid  stenosis measurements (when applicable) are obtained utilizing NASCET criteria, using the distal internal carotid diameter as the denominator. CONTRAST:  Isovue 370, 50 mL. COMPARISON:  MRI brain and MRA intracranial 05/19/2016. FINDINGS: Aortic arch: Standard branching. Imaged portion shows no evidence of aneurysm or dissection. No significant stenosis of the major arch vessel origins. Mild non stenotic calcific plaque at the origin of the innominate, and LEFT common carotid arteries. Calcific and noncalcific plaque at the origin of LEFT subclavian, estimated 50% stenosis. Right carotid system: Minor calcific plaque. Dolichoectatic cervical ICA. No evidence of dissection, stenosis (50% or greater) or occlusion. Left carotid system: Incidental nonocclusive and non stenotic web, LEFT ICA origin No evidence of dissection, stenosis (50% or greater) or occlusion. Vertebral arteries: Codominant. No evidence of dissection, stenosis (50% or greater) or occlusion. Skeleton: Cervical spondylosis with significant disc space narrowing at C5-6 and C6-7. The patient is edentulous. Other neck: No neck masses. Upper chest: No lung apex lesion.  Minor changes of COPD. IMPRESSION: No carotid bifurcation stenosis or dissection. Minor atheromatous change at the great vessel origins of the innominate and LEFT common carotid artery, with calcific and noncalcific plaque resulting in 50% stenosis LEFT subclavian origin. Electronically Signed   By: Staci Righter M.D.   On: 05/20/2016 09:53   Mr Brain Wo Contrast  Result Date: 05/19/2016 CLINICAL DATA:  65 y/o  F; left facial droop and slurred speech. EXAM: MRI HEAD WITHOUT CONTRAST MRA HEAD WITHOUT CONTRAST TECHNIQUE: Multiplanar, multiecho pulse sequences of the brain and surrounding structures were obtained without intravenous contrast. Angiographic images of the head were obtained using MRA technique without contrast. COMPARISON:  05/19/2016 CT head. FINDINGS: MRI HEAD FINDINGS  Brain: There are small foci of diffusion restriction within the right anterior paramedian superior pons, the right posterior limb of internal capsule, the right mid corona radiata, and the left parietal operculum consistent with acute infarction. There additional foci of diffusion hyperintensity without low ADC in the left splenium of corpus callosum and left occipital lobe which may represent late subacute/chronic area of ischemia. Chronic lacunar infarct within the right inferior pons and probable small old infarct in the right posterior medial frontal lobe. No abnormal susceptibility hypointensity to indicate intracranial hemorrhage. Extra-axial space: No hydrocephalus. No extra-axial collection is identified. Cavum septum pellucidum. Other: Mild maxillary sinus mucosal thickening. No abnormal signal of the mastoid air cells. Orbits are unremarkable. Calvarium is unremarkable.  13 mm Thornwaldt cyst. MRA HEAD FINDINGS Internal carotid arteries: Patent. Anterior cerebral arteries: Patent. Middle cerebral arteries: Patent. Anterior communicating artery: Patent. Posterior communicating arteries: Possible diminutive right. No left identified, hypoplastic or absent. Posterior cerebral arteries: Patent. Basilar artery: Patent. Vertebral arteries: Patent. No evidence of high-grade stenosis, large vessel occlusion, or aneurysm unless noted above. IMPRESSION: 1. Several small acute/early subacute infarctions within the right anterior paramedian superior pons, right posterior limb of internal capsule, right mid corona radiata, and left parietal operculum. Symptoms of left-sided facial droop and slurred speech are probably attributable to the infarction in right posterior limb of internal capsule. 2. Small late subacute to chronic infarcts within the left splenium of corpus callosum, left occipital lobe, and right inferior pons. 3. No acute intracranial hemorrhage. 4. Mild paranasal sinus disease. 5. No occlusion, aneurysm,  dissection, or significant stenosis of the circle of Willis is identified. Electronically Signed   By: Kristine Garbe M.D.   On: 05/19/2016 21:46   Mr Jodene Nam Head/brain [id number] Cm  Result Date: 05/19/2016 CLINICAL DATA:  65 y/o  F; left facial droop and slurred speech. EXAM: MRI HEAD WITHOUT CONTRAST MRA HEAD WITHOUT CONTRAST TECHNIQUE: Multiplanar, multiecho pulse sequences of the brain and surrounding structures were obtained without intravenous contrast. Angiographic images of the head were obtained using MRA technique without contrast. COMPARISON:  05/19/2016 CT head. FINDINGS: MRI HEAD FINDINGS Brain: There are small foci of diffusion restriction within the right anterior paramedian superior pons, the right posterior limb of internal capsule, the right mid corona radiata, and the left parietal operculum consistent with acute infarction. There additional foci of diffusion hyperintensity without low ADC in the left splenium of corpus callosum and left occipital lobe which may represent late subacute/chronic area of ischemia. Chronic lacunar infarct within the right inferior pons and probable small old infarct in the right posterior medial frontal lobe. No abnormal susceptibility hypointensity to indicate intracranial hemorrhage. Extra-axial space: No hydrocephalus. No extra-axial collection is identified. Cavum septum pellucidum. Other: Mild maxillary sinus mucosal thickening. No abnormal signal of the mastoid air cells. Orbits are unremarkable. Calvarium is unremarkable. 13 mm Thornwaldt cyst. MRA HEAD FINDINGS Internal carotid arteries: Patent. Anterior cerebral arteries: Patent. Middle cerebral arteries: Patent. Anterior communicating artery: Patent. Posterior communicating arteries: Possible diminutive right. No left identified, hypoplastic or absent. Posterior cerebral arteries: Patent. Basilar artery: Patent. Vertebral arteries: Patent. No evidence of high-grade stenosis, large vessel occlusion, or  aneurysm unless noted above. IMPRESSION: 1. Several small acute/early subacute infarctions within the right anterior paramedian superior pons, right posterior limb of internal capsule, right mid corona radiata, and left parietal operculum. Symptoms of left-sided facial droop and slurred speech are probably attributable to the infarction in right posterior limb of internal capsule. 2. Small late subacute to chronic infarcts within the left splenium of corpus callosum, left occipital lobe, and right inferior pons. 3. No acute intracranial hemorrhage. 4. Mild paranasal sinus disease. 5. No occlusion, aneurysm, dissection, or significant stenosis of the circle of Willis is identified. Electronically Signed   By: Kristine Garbe M.D.   On: 05/19/2016 21:46     Scheduled Meds: .  stroke: mapping our early stages of recovery book   Does not apply Once  . amLODipine  5 mg Oral Daily  . aspirin  300 mg Rectal Daily   Or  . aspirin  325 mg Oral Daily  . atorvastatin  80 mg Oral q1800  . enoxaparin (LOVENOX) injection  40 mg Subcutaneous Q24H  . escitalopram  10 mg Oral Daily  . ezetimibe  10 mg Oral Daily  . irbesartan  300 mg Oral Daily  . metoprolol  50 mg Oral BID   Continuous Infusions: . sodium chloride       LOS: 0 days    Time spent: 30 min    Janece Canterbury, MD [facility] Pager [phone]  If 7PM-7AM, please contact night-coverage [email or web address] Password [email or web address] 05/20/2016, 1:42 PM

## 2016-05-20 NOTE — Progress Notes (Signed)
Inpatient Rehabilitation  I met with the patient and her husband at the bedside to discuss the recommendation for IP Rehabilitation.  I explained the benefits of CIR as it relates to functional progress/recovery.  Pt. initially stating she preferred to DC home. We reviewed some of the findings in the PT and OT evaluations related to pt's decreased awareness of deficits and safety as well as vision deficits and left sided inattention.  Pt. also with balance deficits and increased fall risk.  Once these things were explained, pt. and husband stated they feel CIR would be her best option to help her "get back to normal".  They requested that I pursue insurance authorization.  I have initiated the insurance auth process.  I will provide an update when I receive a [facility] decision. Please call if questions.  [facility] Admissions Coordinator Cell [phone] Office [phone]

## 2016-05-20 NOTE — Progress Notes (Signed)
STROKE TEAM PROGRESS NOTE   HISTORY OF PRESENT ILLNESS (per record) Elizabeth [name] is a 65 year old patient who reports that when she woke up this morning 05/19/2016 she noted a slight slurring of her speech and weakness of the left side of her face. She felt a slight heaviness of her left upper extremity but no distinct weakness. She reports that the symptoms progressed and that during the day. When she went to church she felt that she had some decreased vision in her left eye. This is presently resolved. She also reports that she fell on Wednesday when she was getting into the bathtub and she had lost her balance.  She has a known history of hypertension but reports that she ran out of her blood pressure medication a few days ago. Patient was not administered IV t-PA secondary to delay in arrival. She was admitted for further evaluation and treatment.   SUBJECTIVE (INTERVAL HISTORY) Her husband is at the bedside.  Overall she feels her condition is stable. She would really like to go home, trying to talk her husband and the doctor into letting her go. She still has left hand clumsy and left facial droop but improved. Need TEE and loop.    OBJECTIVE Temp:  [97.8 F (36.6 C)-98.6 F (37 C)] 97.8 F (36.6 C) (08/28 0858) Pulse Rate:  [60-80] 61 (08/28 0858) Cardiac Rhythm: Sinus bradycardia (08/28 0750) Resp:  [16-27] 16 (08/28 0858) BP: (162-200)/(78-128) 167/82 (08/28 0858) SpO2:  [94 %-100 %] 94 % (08/28 0858) Weight:  [61.2 kg (134 lb 14.7 oz)] 61.2 kg (134 lb 14.7 oz) (08/27 2030)  CBC:  Recent Labs Lab 05/19/16 1444 05/19/16 1610  WBC 9.3  --   NEUTROABS 4.8  --   HGB 14.6 16.0*  HCT 42.6 47.0*  MCV 86.4  --   PLT 157  --     Basic Metabolic Panel:  Recent Labs Lab 05/19/16 1444 05/19/16 1610  NA 136 139  K 3.9 4.1  CL 108 103  CO2 25  --   GLUCOSE 111* 93  BUN 9 9  CREATININE 0.83 0.70  CALCIUM 10.0  --     Lipid Panel:    Component Value Date/Time   CHOL 248 (H)  05/20/2016 0417   TRIG 145 05/20/2016 0417   HDL 54 05/20/2016 0417   CHOLHDL 4.6 05/20/2016 0417   VLDL 29 05/20/2016 0417   LDLCALC 165 (H) 05/20/2016 0417   HgbA1c: No results found for: HGBA1C Urine Drug Screen:    Component Value Date/Time   LABOPIA NONE DETECTED 05/20/2016 0818   COCAINSCRNUR NONE DETECTED 05/20/2016 0818   LABBENZ NONE DETECTED 05/20/2016 0818   AMPHETMU NONE DETECTED 05/20/2016 0818   THCU NONE DETECTED 05/20/2016 0818   LABBARB NONE DETECTED 05/20/2016 0818      IMAGING I have personally reviewed the radiological images below and agree with the radiology interpretations.  Ct Head Wo Contrast 05/19/2016 Small area of hypoattenuation in the deep white matter of the right frontal lobe, which likely represents an age-indeterminate ischemic infarct. Alternatively this may represent an area of demyelination. Mild brain parenchymal atrophy.   Ct Angio Neck W Or Wo Contrast 05/20/2016 No carotid bifurcation stenosis or dissection. Minor atheromatous change at the great vessel origins of the innominate and LEFT common carotid artery, with calcific and noncalcific plaque resulting in 50% stenosis LEFT subclavian origin.  Mr Brain Wo Contrast Mr Jodene Nam Head/brain Wo Cm 05/19/2016 1. Several small acute/early subacute infarctions within the right anterior  paramedian superior pons, right posterior limb of internal capsule, right mid corona radiata, and left parietal operculum.  2. Small late subacute to chronic infarcts within the left splenium of corpus callosum, left occipital lobe, and right inferior pons. 3. No acute intracranial hemorrhage. 4. Mild paranasal sinus disease. 5. No occlusion, aneurysm, dissection, or significant stenosis of the circle of Willis is identified.   Transcranial Doppler with Monitoring - no HITS.  Lower extremity venous duplex: No evidence of DVT or baker's cyst.   PHYSICAL EXAM  Temp:  [97.8 F (36.6 C)-98.6 F (37 C)] 98.2 F (36.8  C) (08/28 1657) Pulse Rate:  [50-61] 50 (08/28 1657) Resp:  [16-20] 20 (08/28 1657) BP: (162-190)/(78-100) 167/80 (08/28 1657) SpO2:  [94 %-100 %] 100 % (08/28 1657) Weight:  [134 lb 14.7 oz (61.2 kg)] 134 lb 14.7 oz (61.2 kg) (08/27 2030)  General - Well nourished, well developed, in no apparent distress.  Ophthalmologic - Sharp disc margins OU.   Cardiovascular - Regular rate and rhythm.  Mental Status -  Level of arousal and orientation to time, place, and person were intact. Language including expression, naming, repetition, comprehension was assessed and found intact. Fund of Knowledge was assessed and was intact.  Cranial Nerves II - XII - II - Visual field intact OU. III, IV, VI - Extraocular movements intact. V - Facial sensation intact bilaterally. VII - mild left facial droop. VIII - Hearing & vestibular intact bilaterally. X - Palate elevates symmetrically, mild dysarthria. XI - Chin turning & shoulder shrug intact bilaterally. XII - Tongue protrusion intact.  Motor Strength - The patient's strength was normal in all extremities except left hand decreased dexterity and pronator drift was present.  Bulk was normal and fasciculations were absent.   Motor Tone - Muscle tone was assessed at the neck and appendages and was normal.  Reflexes - The patient's reflexes were 1+ in all extremities and she had no pathological reflexes.  Sensory - Light touch, temperature/pinprick were assessed and were symmetrical.    Coordination - The patient had normal movements in the hands with no ataxia or dysmetria.  Tremor was absent.  Gait and Station - deferred.   ASSESSMENT/PLAN Ms. Elizabeth [name] is a 65 y.o. Left handed female with history of coronary artery disease status post MI 3 with 3 drug-eluting stents, hypertension, hyperlipidemia, anxiety, arthritis, sleep sickle cell trait, and active smoking, presenting with left facial droop and blurring of vision in left eye. She  did not receive IV t-PA due to delay in arrival.   Stroke:  Right anterior paramedian pontine, right posterior limb of internal capsule, right mid corona radiata and left parietal operculum infarcts (multiple anterior and posterior vascular territories) embolic secondary to unknown source  MRI  Right anterior paramedian pontine, right posterior limb of internal capsule, right mid corona radiata and left parietal operculum infarcts.  MRA head no significant intracranial atherosclerosis   CTA neck no significant large vessel stenosis  2D Echo  pending   LE dopplers negative for DVT  TCD MES negative for emboli  TEE to look for embolic source. Arranged with [place] for tomorrow. (I have made patient NPO after midnight tonight).  If TEE negative, a [place] electrophysiologist will consult and consider placement of an implantable loop recorder to evaluate for atrial fibrillation as etiology of stroke. This has been explained to patient/family by Dr. Erlinda Hong and they are agreeable.   LDL 165  HgbA1c pending  Lovenox 40 mg sq daily for VTE prophylaxis  Diet Heart Room service appropriate? Yes; Fluid consistency: Thin  aspirin 81 mg daily prior to admission, now on aspirin 325 mg daily.   Patient counseled to be compliant with her antithrombotic medications  Ongoing aggressive stroke risk factor management  Therapy recommendations:  CIR  Disposition:  pending   Hypertensive emergency  Blood pressure 200/11 on arrival in setting of new neurologic symptoms  Stabilizing, SBP 160s  Permissive hypertension (OK if < 220/120) but gradually normalize in 5-7 days  Long-term BP goal normotensive  Hyperlipidemia  Home meds:  lipitor 80 and zetia 10, resumed in hospital  LDL 165, goal < 70  Continue statin at discharge  Tobacco abuse  Current smoker  Smoking cessation counseling provided  Pt is willing to quit  Other Stroke  Risk Factors  Advanced age  Coronary artery disease - MI x 3, S/p DES x 3  Sickle cell trait  Other Active Problems  Depression   Hospital day # 0  Rosalin Hawking, MD PhD Stroke Neurology 05/20/2016 6:42 PM     To contact Stroke Continuity provider, please refer to [email or web address] After hours, contact General Neurology

## 2016-05-20 NOTE — Consult Note (Signed)
Physical Medicine and Rehabilitation Consult Reason for Consult: Acute/early subacute infarct right anterior paramedian superior pons, posterior limb, right mid corona radiata and left parietal operculum Referring Physician: [facility]   HPI: Elizabeth [name] is a 65 y.o. 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 Per chart review patient lives with spouse. Independent with a cane prior to admission. One level home with 3 steps to entry. Spouse works third shift. Reports there is a niece that can assist. Presented 05/19/2016 with left facial droop and slurred speech. By report patient has not seen her PCP since 2014 and ran out of her blood pressure medications 2 weeks prior to admission. CT/MRI showed several small acute early subacute infarcts within the right anterior paramedian superior pons, right posterior limb of internal capsule, right mid corona radiata and left parietal operculum. Small late subacute to chronic infarcts within the left splenium of corpus colostrum, left occipital lobe and right inferior pons. MRA of the head with no occlusion aneurysm or dissection without significant stenosis. CT angiogram of the neck no carotid bifurcation stenosis or dissection. Patient did not receive TPA. Echocardiogram is pending. Currently maintained on aspirin for CVA prophylaxis. Subcutaneous Lovenox for DVT prophylaxis. Tolerating a regular consistency diet. Physical and occupational therapy evaluation completed with recommendations of physical medicine rehabilitation consult.   Review of Systems  Constitutional: Negative for chills and fever.  HENT: Negative for hearing loss.   Eyes: Positive for blurred vision. Negative for double vision.  Respiratory: Negative for cough and shortness of breath.   Cardiovascular: Positive for palpitations and leg swelling. Negative for chest pain.  Gastrointestinal:  Positive for blood in stool. Negative for nausea and vomiting.  Genitourinary: Negative for dysuria and hematuria.  Musculoskeletal: Positive for joint pain and myalgias.  Skin: Negative for rash.  Neurological: Positive for weakness. Negative for sensory change, focal weakness, seizures and headaches.  Psychiatric/Behavioral:       Anxiety  All other systems reviewed and are negative.  Past Medical History:  Diagnosis Date  . Abrasion or friction burn of foot and toe(s), with infection   . Allergy    soy  . Anxiety   . 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 Degenerative joint disease    Generalized  . Hyperlipidemia   . Hypertension   . Myocardial infarction ([place])   . Sickle cell trait ([place])   . Tobacco abuse April, 2011   Continued   Past Surgical History:  Procedure Laterality Date  . ANGIOPLASTY  2006   x3 stents  . DILATION AND CURETTAGE OF UTERUS    . FOOT SURGERY  2000  . LITHOTRIPSY  2008   left kidney stone and also ureteral stent placed   Family History  Problem Relation Age of Onset  . Coronary artery disease Mother   . Coronary artery disease Father    Social History:  reports that she has been smoking Cigarettes.  She has never used smokeless tobacco. She reports that she does not drink alcohol or use drugs. Allergies:  Allergies  Allergen Reactions  . Shrimp [Shellfish Allergy] Nausea And Vomiting  . Adhesive [Tape] Other (See Comments)    Unknown  . Soy Allergy Nausea And Vomiting   Medications Prior to Admission  Medication Sig Dispense Refill  .  aspirin 81 MG EC tablet Take 81 mg by mouth 2 (two) times daily.      Marland Kitchen atorvastatin (LIPITOR) 80 MG tablet TAKE 1 TABLET (80 MG TOTAL) BY MOUTH DAILY. (Patient not taking: Reported on 05/19/2016) 90 tablet 3  . enalapril (VASOTEC) 10 MG tablet Take 1 tablet (10 mg total) by mouth 2 (two) times daily. (Patient not  taking: Reported on 05/19/2016) 60 tablet 11  . escitalopram (LEXAPRO) 10 MG tablet Take 1 tablet (10 mg total) by mouth daily. (Patient not taking: Reported on 05/19/2016) 90 tablet 3  . ezetimibe (ZETIA) 10 MG tablet Take 1 tablet (10 mg total) by mouth daily. (Patient not taking: Reported on 05/19/2016) 90 tablet 3  . irbesartan (AVAPRO) 300 MG tablet Take 1 tablet (300 mg total) by mouth daily. (Patient not taking: Reported on 05/19/2016) 90 tablet 3  . metoprolol (LOPRESSOR) 50 MG tablet Take 1 tablet (50 mg total) by mouth 2 (two) times daily. (Patient not taking: Reported on 05/19/2016) 180 tablet 3  . nitroGLYCERIN (NITROLINGUAL) 0.4 MG/SPRAY spray Place 1 spray under the tongue every 5 (five) minutes as needed. May repeat up to 3 doses. 12 g 3    Home: Home Living Family/patient expects to be discharged to:: Private residence Living Arrangements: Spouse/significant other Available Help at Discharge: Family, Available PRN/intermittently (spouse works 3rd shift, reports niece can come stay with her) Type of Home: House Home Access: Stairs to enter Technical brewer of Steps: 3 Entrance Stairs-Rails: Right [place]: One level Bathroom Shower/Tub: Tub/shower unit, Walk-in shower Home Equipment: Radio producer - single point Additional Comments: watches 2 yo granddaughter  Functional History: Prior Function Level of Independence: Independent Comments: used cane occasionally when L knee would hurt, watches kids Functional Status:  Mobility: Bed Mobility Overal bed mobility: Modified Independent General bed mobility comments: used bed rail Transfers Overall transfer level: Needs assistance Equipment used: None Transfers: Sit to/from Stand Sit to Stand: Min guard General transfer comment: pt held onto rail to steady self, min guard to steady pt Ambulation/Gait Ambulation/Gait assistance: Min assist Ambulation Distance (Feet): 150 Feet Assistive device: None Gait Pattern/deviations:  Step-through pattern, Decreased stride length, Staggering left General Gait Details: pt consistently running into objects on the L without recognition or problem solving around the obstacle Gait velocity: guarded Gait velocity interpretation: Below normal speed for age/gender Stairs: Yes Stairs assistance: Min assist Stair Management: One rail Right Number of Stairs: 3 (x2) General stair comments: pt leaning to the L, minA to maintain balance without reaching for L rail (only used R HR to mimic home set up)    ADL: ADL Overall ADL's : Needs assistance/impaired Eating/Feeding: Set up, Sitting Eating/Feeding Details (indicate cue type and reason): cues for L side  Grooming: Wash/dry face, Oral care, Set up Toilet Transfer: Min Psychiatric nurse Details (indicate cue type and reason): pt reaching for environmental supports on L side. pt with limp to gait pattern with transfers General ADL Comments: Pt on arrival reports "there is a black spot in my vision over here" pt reports "its like a dirt I can see it but its like its dirty" pt with testing has greater deficits with white surfaces. Contrast on cell phone turned to more of a grey color to help with reading cell phone  Cognition: Cognition Overall Cognitive Status: Impaired/Different from baseline Orientation Level: Oriented X4 Cognition Arousal/Alertness: Awake/alert Behavior During Therapy: WFL for tasks assessed/performed Overall Cognitive Status: Impaired/Different from baseline Area of Impairment: Safety/judgement Safety/Judgement: Decreased awareness of  deficits, Decreased awareness of safety General Comments: pt with L sided neglect  Blood pressure (!) 167/82, pulse 61, temperature 97.8 F (36.6 C), temperature source Oral, resp. rate 16, height 5\' 3"  (1.6 m), weight 61.2 kg (134 lb 14.7 oz), SpO2 94 %. Physical Exam  Vitals reviewed. Constitutional: She is oriented to person, place, and time. She appears  well-developed and well-nourished.  HENT:  Head: Normocephalic and atraumatic.  Eyes: EOM are normal.  Pupils reactive to light  Neck: Normal range of motion. Neck supple. No thyromegaly present.  Cardiovascular: Normal rate and regular rhythm.   Respiratory: Effort normal and breath sounds normal. No respiratory distress.  GI: Soft. Bowel sounds are normal. She exhibits no distension.  Musculoskeletal: She exhibits no edema or tenderness.  Neurological: She is alert and oriented to person, place, and time.  Follows three step commands.  Speech is dysarthric but intelligible Sensation intact to light touch DTRs brisk b/l LE Motor: 4+-5/5 throughout No ataxia, dysmetria.  Skin: Skin is warm and dry.  Psychiatric: She has a normal mood and affect. Her behavior is normal.    Results for orders placed or performed during the hospital encounter of 05/19/16 (from the past 24 hour(s))  Protime-INR     Status: None   Collection Time: 05/19/16  2:44 PM  Result Value Ref Range   Prothrombin Time 12.6 11.4 - 15.2 seconds   INR 0.95   APTT     Status: None   Collection Time: 05/19/16  2:44 PM  Result Value Ref Range   aPTT 29 24 - 36 seconds  CBC     Status: None   Collection Time: 05/19/16  2:44 PM  Result Value Ref Range   WBC 9.3 4.0 - 10.5 K/uL   RBC 4.93 3.87 - 5.11 MIL/uL   Hemoglobin 14.6 12.0 - 15.0 g/dL   HCT 42.6 36.0 - 46.0 %   MCV 86.4 78.0 - 100.0 fL   MCH 29.6 26.0 - 34.0 pg   MCHC 34.3 30.0 - 36.0 g/dL   RDW 14.5 11.5 - 15.5 %   Platelets 157 150 - 400 K/uL  Differential     Status: None   Collection Time: 05/19/16  2:44 PM  Result Value Ref Range   Neutrophils Relative % 52 %   Neutro Abs 4.8 1.7 - 7.7 K/uL   Lymphocytes Relative 39 %   Lymphs Abs 3.6 0.7 - 4.0 K/uL   Monocytes Relative 7 %   Monocytes Absolute 0.7 0.1 - 1.0 K/uL   Eosinophils Relative 1 %   Eosinophils Absolute 0.1 0.0 - 0.7 K/uL   Basophils Relative 1 %   Basophils Absolute 0.1 0.0 - 0.1  K/uL  Comprehensive metabolic panel     Status: Abnormal   Collection Time: 05/19/16  2:44 PM  Result Value Ref Range   Sodium 136 135 - 145 mmol/L   Potassium 3.9 3.5 - 5.1 mmol/L   Chloride 108 101 - 111 mmol/L   CO2 25 22 - 32 mmol/L   Glucose, Bld 111 (H) 65 - 99 mg/dL   BUN 9 6 - 20 mg/dL   Creatinine, Ser 0.83 0.44 - 1.00 mg/dL   Calcium 10.0 8.9 - 10.3 mg/dL   Total Protein 6.6 6.5 - 8.1 g/dL   Albumin 3.7 3.5 - 5.0 g/dL   AST 18 15 - 41 U/L   ALT 11 (L) 14 - 54 U/L   Alkaline Phosphatase 101 38 - 126 U/L   Total  Bilirubin 0.5 0.3 - 1.2 mg/dL   GFR calc non Af Amer >60 >60 mL/min   GFR calc Af Amer >60 >60 mL/min   Anion gap 3 (L) 5 - 15  I-stat troponin, ED     Status: None   Collection Time: 05/19/16  4:07 PM  Result Value Ref Range   Troponin i, poc 0.01 0.00 - 0.08 ng/mL   Comment 3          I-Stat Chem 8, ED     Status: Abnormal   Collection Time: 05/19/16  4:10 PM  Result Value Ref Range   Sodium 139 135 - 145 mmol/L   Potassium 4.1 3.5 - 5.1 mmol/L   Chloride 103 101 - 111 mmol/L   BUN 9 6 - 20 mg/dL   Creatinine, Ser 0.70 0.44 - 1.00 mg/dL   Glucose, Bld 93 65 - 99 mg/dL   Calcium, Ion 1.26 (H) 1.12 - 1.23 mmol/L   TCO2 27 0 - 100 mmol/L   Hemoglobin 16.0 (H) 12.0 - 15.0 g/dL   HCT 47.0 (H) 36.0 - 46.0 %  Lipid panel     Status: Abnormal   Collection Time: 05/20/16  4:17 AM  Result Value Ref Range   Cholesterol 248 (H) 0 - 200 mg/dL   Triglycerides 145 <150 mg/dL   HDL 54 >40 mg/dL   Total CHOL/HDL Ratio 4.6 RATIO   VLDL 29 0 - 40 mg/dL   LDL Cholesterol 165 (H) 0 - 99 mg/dL  Urine rapid drug screen (hosp performed)     Status: None   Collection Time: 05/20/16  8:18 AM  Result Value Ref Range   Opiates NONE DETECTED NONE DETECTED   Cocaine NONE DETECTED NONE DETECTED   Benzodiazepines NONE DETECTED NONE DETECTED   Amphetamines NONE DETECTED NONE DETECTED   Tetrahydrocannabinol NONE DETECTED NONE DETECTED   Barbiturates NONE DETECTED NONE DETECTED    Ct Head Wo Contrast  Result Date: 05/19/2016 CLINICAL DATA:  Left-sided facial droop and weakness. Slight slurred speech. EXAM: CT HEAD WITHOUT CONTRAST TECHNIQUE: Contiguous axial images were obtained from the base of the skull through the vertex without intravenous contrast. COMPARISON:  None. FINDINGS: Brain: No evidence of acute hemorrhage, hydrocephalus, extra-axial collection or mass lesion/mass effect. There is a small area of hypoattenuation in the deep white matter of the right frontal lobe, bordering the right lateral ventricle, likely representing an age-indeterminate ischemic infarct. Vascular: No hyperdense vessel or unexpected calcification. Skull: No acute fractures. Sinuses/Orbits: No acute finding. Other: None. IMPRESSION: Small area of hypoattenuation in the deep white matter of the right frontal lobe, which likely represents an age-indeterminate ischemic infarct. Alternatively this may represent an area of demyelination. Mild brain parenchymal atrophy. These results were called by telephone at the time of interpretation on 05/19/2016 at 3:46 pm to Dr. Audie Pinto, who verbally acknowledged these results. Electronically Signed   By: Fidela Salisbury M.D.   On: 05/19/2016 15:51   Ct Angio Neck W Or Wo Contrast  Result Date: 05/20/2016 CLINICAL DATA:  Multiple acute infarcts, with symptoms of LEFT facial droop and slurred speech new onset 05/19/2016. EXAM: CT ANGIOGRAPHY NECK TECHNIQUE: Multidetector CT imaging of the neck was performed using the standard protocol during bolus administration of intravenous contrast. Multiplanar CT image reconstructions and MIPs were obtained to evaluate the vascular anatomy. Carotid stenosis measurements (when applicable) are obtained utilizing NASCET criteria, using the distal internal carotid diameter as the denominator. CONTRAST:  Isovue 370, 50 mL. COMPARISON:  MRI brain  and MRA intracranial 05/19/2016. FINDINGS: Aortic arch: Standard branching. Imaged  portion shows no evidence of aneurysm or dissection. No significant stenosis of the major arch vessel origins. Mild non stenotic calcific plaque at the origin of the innominate, and LEFT common carotid arteries. Calcific and noncalcific plaque at the origin of LEFT subclavian, estimated 50% stenosis. Right carotid system: Minor calcific plaque. Dolichoectatic cervical ICA. No evidence of dissection, stenosis (50% or greater) or occlusion. Left carotid system: Incidental nonocclusive and non stenotic web, LEFT ICA origin No evidence of dissection, stenosis (50% or greater) or occlusion. Vertebral arteries: Codominant. No evidence of dissection, stenosis (50% or greater) or occlusion. Skeleton: Cervical spondylosis with significant disc space narrowing at C5-6 and C6-7. The patient is edentulous. Other neck: No neck masses. Upper chest: No lung apex lesion.  Minor changes of COPD. IMPRESSION: No carotid bifurcation stenosis or dissection. Minor atheromatous change at the great vessel origins of the innominate and LEFT common carotid artery, with calcific and noncalcific plaque resulting in 50% stenosis LEFT subclavian origin. Electronically Signed   By: Staci Righter M.D.   On: 05/20/2016 09:53   Mr Brain Wo Contrast  Result Date: 05/19/2016 CLINICAL DATA:  65 y/o  F; left facial droop and slurred speech. EXAM: MRI HEAD WITHOUT CONTRAST MRA HEAD WITHOUT CONTRAST TECHNIQUE: Multiplanar, multiecho pulse sequences of the brain and surrounding structures were obtained without intravenous contrast. Angiographic images of the head were obtained using MRA technique without contrast. COMPARISON:  05/19/2016 CT head. FINDINGS: MRI HEAD FINDINGS Brain: There are small foci of diffusion restriction within the right anterior paramedian superior pons, the right posterior limb of internal capsule, the right mid corona radiata, and the left parietal operculum consistent with acute infarction. There additional foci of diffusion  hyperintensity without low ADC in the left splenium of corpus callosum and left occipital lobe which may represent late subacute/chronic area of ischemia. Chronic lacunar infarct within the right inferior pons and probable small old infarct in the right posterior medial frontal lobe. No abnormal susceptibility hypointensity to indicate intracranial hemorrhage. Extra-axial space: No hydrocephalus. No extra-axial collection is identified. Cavum septum pellucidum. Other: Mild maxillary sinus mucosal thickening. No abnormal signal of the mastoid air cells. Orbits are unremarkable. Calvarium is unremarkable. 13 mm Thornwaldt cyst. MRA HEAD FINDINGS Internal carotid arteries: Patent. Anterior cerebral arteries: Patent. Middle cerebral arteries: Patent. Anterior communicating artery: Patent. Posterior communicating arteries: Possible diminutive right. No left identified, hypoplastic or absent. Posterior cerebral arteries: Patent. Basilar artery: Patent. Vertebral arteries: Patent. No evidence of high-grade stenosis, large vessel occlusion, or aneurysm unless noted above. IMPRESSION: 1. Several small acute/early subacute infarctions within the right anterior paramedian superior pons, right posterior limb of internal capsule, right mid corona radiata, and left parietal operculum. Symptoms of left-sided facial droop and slurred speech are probably attributable to the infarction in right posterior limb of internal capsule. 2. Small late subacute to chronic infarcts within the left splenium of corpus callosum, left occipital lobe, and right inferior pons. 3. No acute intracranial hemorrhage. 4. Mild paranasal sinus disease. 5. No occlusion, aneurysm, dissection, or significant stenosis of the circle of Willis is identified. Electronically Signed   By: Kristine Garbe M.D.   On: 05/19/2016 21:46   Mr Jodene Nam Head/brain [id number] Cm  Result Date: 05/19/2016 CLINICAL DATA:  65 y/o  F; left facial droop and slurred speech. EXAM:  MRI HEAD WITHOUT CONTRAST MRA HEAD WITHOUT CONTRAST TECHNIQUE: Multiplanar, multiecho pulse sequences of the brain and surrounding structures were obtained  without intravenous contrast. Angiographic images of the head were obtained using MRA technique without contrast. COMPARISON:  05/19/2016 CT head. FINDINGS: MRI HEAD FINDINGS Brain: There are small foci of diffusion restriction within the right anterior paramedian superior pons, the right posterior limb of internal capsule, the right mid corona radiata, and the left parietal operculum consistent with acute infarction. There additional foci of diffusion hyperintensity without low ADC in the left splenium of corpus callosum and left occipital lobe which may represent late subacute/chronic area of ischemia. Chronic lacunar infarct within the right inferior pons and probable small old infarct in the right posterior medial frontal lobe. No abnormal susceptibility hypointensity to indicate intracranial hemorrhage. Extra-axial space: No hydrocephalus. No extra-axial collection is identified. Cavum septum pellucidum. Other: Mild maxillary sinus mucosal thickening. No abnormal signal of the mastoid air cells. Orbits are unremarkable. Calvarium is unremarkable. 13 mm Thornwaldt cyst. MRA HEAD FINDINGS Internal carotid arteries: Patent. Anterior cerebral arteries: Patent. Middle cerebral arteries: Patent. Anterior communicating artery: Patent. Posterior communicating arteries: Possible diminutive right. No left identified, hypoplastic or absent. Posterior cerebral arteries: Patent. Basilar artery: Patent. Vertebral arteries: Patent. No evidence of high-grade stenosis, large vessel occlusion, or aneurysm unless noted above. IMPRESSION: 1. Several small acute/early subacute infarctions within the right anterior paramedian superior pons, right posterior limb of internal capsule, right mid corona radiata, and left parietal operculum. Symptoms of left-sided facial droop and  slurred speech are probably attributable to the infarction in right posterior limb of internal capsule. 2. Small late subacute to chronic infarcts within the left splenium of corpus callosum, left occipital lobe, and right inferior pons. 3. No acute intracranial hemorrhage. 4. Mild paranasal sinus disease. 5. No occlusion, aneurysm, dissection, or significant stenosis of the circle of Willis is identified. Electronically Signed   By: Kristine Garbe M.D.   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  Records reviewed and summated above. Stroke: Continue secondary stroke prophylaxis and Risk Factor Modification listed below:   Antiplatelet therapy:   Blood Pressure Management:  Continue current medication with prn's with permisive HTN per primary team Statin Agent:   Tobacco abuse:    1. Does the need for close, 24 hr/day medical supervision in concert with the patient's rehab needs make it unreasonable for this patient to be served in a less intensive setting? Potentially 2. Co-Morbidities requiring supervision/potential complications: CAD status post MI (cont meds), HTN (monitor and provide prns in accordance with increased physical exertion and pain), sickle cell trait (cont to monitor for crisis), tobacco abuse (counsel) 3. Due to safety, disease management and patient education, does the patient require 24 hr/day rehab nursing? Potentially 4. Does the patient require coordinated care of a physician, rehab nurse, PT (1-2 hrs/day, 5 days/week) and OT (1-2 hrs/day, 5 days/week) to address physical and functional deficits in the context of the above medical diagnosis(es)? Potentially Addressing deficits in the following areas: balance, endurance, locomotion, strength, transferring, bathing, dressing,  toileting and psychosocial support 5. Can the patient actively participate in an intensive therapy program of at least 3 hrs of therapy per day at least 5 days per week? Yes 6. The potential for patient to make measurable gains while on inpatient rehab is excellent 7. Anticipated functional outcomes upon discharge from inpatient rehab are modified independent  with PT, modified independent with OT, n/a with SLP. 8. Estimated rehab length  of stay to reach the above functional goals is: 8-12 days. 9. Does the patient have adequate social supports and living environment to accommodate these discharge functional goals? Yes 10. Anticipated D/C setting: Home 11. Anticipated post D/C treatments: HH therapy and Home excercise program 12. Overall Rehab/Functional Prognosis: good  RECOMMENDATIONS: This patient's condition is appropriate for continued rehabilitative care in the following setting: CIR after completion of medical workup  Patient has agreed to participate in recommended program. Potentially Note that insurance prior authorization may be required for reimbursement for recommended care.  Comment: Rehab Admissions Coordinator to follow up.  Delice Lesch, MD 05/20/2016

## 2016-05-20 NOTE — Evaluation (Signed)
Physical Therapy Evaluation Patient Details Name: Elizabeth [name] MRN: [id number] DOB: [birth year] Today's Date: 05/20/2016   History of Present Illness  Elizabeth Everettis a 65 y.o.femalewith medical history significant of CAD s/p MI x 3 with three DES, hypertension, hyperlipidemia, anxiety, arthritis, sickle cell trait, active smoking who presents with left facial droop and slurred speech. MRI revealed severl small acute infarct in anterior paramedian superior pons, R posterior limb of internal capsule, R mid corona radiata, and L parietal operculum.  Clinical Impression  Pt admitted with above. Pt with noted L sided inattention, vision deficits, decreased awareness of deficits and safety, and impaired balance/increased falls risk. Recommend CIR stay upon d/c to achieve safe mod I level of function as patient is motivated and was independent PTA.    Follow Up Recommendations CIR    Equipment Recommendations  None recommended by PT (TBD by next venue)    Recommendations for Other Services Rehab consult     Precautions / Restrictions Precautions Precautions: Fall Precaution Comments: noted vision deficits, alerted OT Restrictions Weight Bearing Restrictions: No      Mobility  Bed Mobility Overal bed mobility: Modified Independent             General bed mobility comments: used bed rail  Transfers Overall transfer level: Needs assistance Equipment used: None Transfers: Sit to/from Stand Sit to Stand: Min guard         General transfer comment: pt held onto rail to steady self, min guard to steady pt  Ambulation/Gait Ambulation/Gait assistance: Min assist Ambulation Distance (Feet): 150 Feet Assistive device: None Gait Pattern/deviations: Step-through pattern;Decreased stride length;Staggering left Gait velocity: guarded Gait velocity interpretation: Below normal speed for age/gender General Gait Details: pt consistently running into objects on the L without  recognition or problem solving around the obstacle  Stairs Stairs: Yes Stairs assistance: Min assist Stair Management: One rail Right Number of Stairs: 3 (x2) General stair comments: pt leaning to the L, minA to maintain balance without reaching for L rail (only used R HR to mimic home set up)  Wheelchair Mobility    Modified Rankin (Stroke Patients Only) Modified Rankin (Stroke Patients Only) Pre-Morbid Rankin Score: No symptoms Modified Rankin: Moderate disability     Balance Overall balance assessment: Needs assistance Sitting-balance support: No upper extremity supported;Feet supported Sitting balance-Leahy Scale: Good     Standing balance support: Single extremity supported;During functional activity Standing balance-Leahy Scale: Poor                   Standardized Balance Assessment Standardized Balance Assessment : Dynamic Gait Index   Dynamic Gait Index Level Surface: Mild Impairment Change in Gait Speed: Moderate Impairment Gait with Horizontal Head Turns: Moderate Impairment Gait with Vertical Head Turns: Moderate Impairment Gait and Pivot Turn: Mild Impairment Step Over Obstacle: Mild Impairment Step Around Obstacles: Moderate Impairment Steps: Mild Impairment Total Score: 12       Pertinent Vitals/Pain Pain Assessment: No/denies pain    Home Living Family/patient expects to be discharged to:: Private residence Living Arrangements: Spouse/significant other Available Help at Discharge: Family;Available PRN/intermittently (spouse works 3rd shift, reports niece can come stay with her) Type of Home: House Home Access: Stairs to enter Entrance Stairs-Rails: Right Entrance Stairs-Number of Steps: 3 Home Layout: One level Home Equipment: Cane - single point Additional Comments: watches 64 yo granddaughter    Prior Function Level of Independence: Independent         Comments: used cane occasionally when L knee would hurt, watches  kids      Hand Dominance   Dominant Hand: Left    Extremity/Trunk Assessment   Upper Extremity Assessment: LUE deficits/detail       LUE Deficits / Details: grossly 4-/5   Lower Extremity Assessment: Defer to PT evaluation   LLE Deficits / Details: grossly 4/5  Cervical / Trunk Assessment: Normal  Communication   Communication: Expressive difficulties (mild slurred speech)  Cognition Arousal/Alertness: Awake/alert Behavior During Therapy: WFL for tasks assessed/performed Overall Cognitive Status: Impaired/Different from baseline Area of Impairment: Safety/judgement         Safety/Judgement: Decreased awareness of deficits;Decreased awareness of safety     General Comments: pt with L sided neglect    General Comments General comments (skin integrity, edema, etc.): pt with noted vision deficits. reports "i see a shadow" and points to left side of vision, noted L peripheral deficit as well    Exercises        Assessment/Plan    PT Assessment Patient needs continued PT services  PT Diagnosis Difficulty walking   PT Problem List Decreased strength;Decreased range of motion;Decreased activity tolerance;Decreased balance;Decreased mobility  PT Treatment Interventions DME instruction;Gait training;Stair training;Functional mobility training;Therapeutic activities;Therapeutic exercise;Balance training   PT Goals (Current goals can be found in the Care Plan section) Acute Rehab PT Goals Patient Stated Goal: go home PT Goal Formulation: With patient Time For Goal Achievement: 05/27/16 Potential to Achieve Goals: Good Additional Goals Additional Goal #1: Pt to score >19 on DGI to indicate minimal falls risk.    Frequency Min 4X/week   Barriers to discharge        Co-evaluation               End of Session Equipment Utilized During Treatment: Gait belt Activity Tolerance: Patient tolerated treatment well Patient left: in bed;with call bell/phone within  reach;with bed alarm set (sitting EOB with RN, ) Nurse Communication: Mobility status    Functional Assessment Tool Used: clinical judgement Functional Limitation: Mobility: Walking and moving around Mobility: Walking and Moving Around Current Status [id number]): At least 20 percent but less than 40 percent impaired, limited or restricted Mobility: Walking and Moving Around Goal Status [phone]): At least 1 percent but less than 20 percent impaired, limited or restricted    Time: 0830-0849 PT Time Calculation (min) (ACUTE ONLY): 19 min   Charges:   PT Evaluation $PT Eval Moderate Complexity: 1 Procedure     PT G Codes:   PT G-Codes **NOT FOR INPATIENT CLASS** Functional Assessment Tool Used: clinical judgement Functional Limitation: Mobility: Walking and moving around Mobility: Walking and Moving Around Current Status [id number]): At least 20 percent but less than 40 percent impaired, limited or restricted Mobility: Walking and Moving Around Goal Status [phone]): At least 1 percent but less than 20 percent impaired, limited or restricted    Kingsley Callander 05/20/2016, 10:52 AM  Kittie Plater, PT, DPT Pager #: [phone] Office #: [phone]

## 2016-05-21 ENCOUNTER — Inpatient Hospital Stay (HOSPITAL_COMMUNITY): Payer: 59

## 2016-05-21 ENCOUNTER — Encounter (HOSPITAL_COMMUNITY)
Admission: EM | Disposition: A | Discharge status: Rehabilitation Facility | Payer: Self-pay | Source: Home / Self Care | Attending: Internal Medicine

## 2016-05-21 ENCOUNTER — Encounter (HOSPITAL_COMMUNITY): Payer: Self-pay

## 2016-05-21 DIAGNOSIS — I340 Nonrheumatic mitral (valve) insufficiency: Secondary | ICD-10-CM

## 2016-05-21 DIAGNOSIS — I6789 Other cerebrovascular disease: Secondary | ICD-10-CM

## 2016-05-21 DIAGNOSIS — I639 Cerebral infarction, unspecified: Secondary | ICD-10-CM

## 2016-05-21 HISTORY — PX: TEE WITHOUT CARDIOVERSION: SHX5443

## 2016-05-21 HISTORY — PX: EP IMPLANTABLE DEVICE: SHX172B

## 2016-05-21 LAB — ECHOCARDIOGRAM COMPLETE
Height: 63 in
WEIGHTICAEL: 2158.74 [oz_av]

## 2016-05-21 LAB — HEMOGLOBIN A1C
Hgb A1c MFr Bld: 5.5 % (ref 4.8–5.6)
MEAN PLASMA GLUCOSE: 111 mg/dL

## 2016-05-21 SURGERY — LOOP RECORDER INSERTION

## 2016-05-21 SURGERY — ECHOCARDIOGRAM, TRANSESOPHAGEAL
Anesthesia: Moderate Sedation

## 2016-05-21 MED ORDER — IRBESARTAN 300 MG PO TABS: 300.0000 mg | ORAL_TABLET | Freq: Every day | ORAL | 0 refills | Status: DC

## 2016-05-21 MED ORDER — MIDAZOLAM HCL 5 MG/ML IJ SOLN
INTRAMUSCULAR | Status: AC
  Filled 2016-05-21: qty 2

## 2016-05-21 MED ORDER — LIDOCAINE-EPINEPHRINE 1 %-1:100000 IJ SOLN
INTRAMUSCULAR | Status: AC
  Filled 2016-05-21: qty 1

## 2016-05-21 MED ORDER — METOPROLOL TARTRATE 50 MG PO TABS: 50.0000 mg | ORAL_TABLET | Freq: Two times a day (BID) | ORAL | 0 refills | Status: DC

## 2016-05-21 MED ORDER — EZETIMIBE 10 MG PO TABS: 10.0000 mg | ORAL_TABLET | Freq: Every day | ORAL | 0 refills | Status: DC

## 2016-05-21 MED ORDER — ASPIRIN 325 MG PO TABS: 325.0000 mg | ORAL_TABLET | Freq: Every day | ORAL | Status: DC

## 2016-05-21 MED ORDER — ESCITALOPRAM OXALATE 10 MG PO TABS: 10.0000 mg | ORAL_TABLET | Freq: Every day | ORAL | 0 refills | Status: DC

## 2016-05-21 MED ORDER — FENTANYL CITRATE (PF) 100 MCG/2ML IJ SOLN
INTRAMUSCULAR | Status: AC
  Filled 2016-05-21: qty 2

## 2016-05-21 MED ORDER — ATORVASTATIN CALCIUM 80 MG PO TABS: 80.0000 mg | ORAL_TABLET | Freq: Every day | ORAL | 0 refills | Status: DC

## 2016-05-21 MED ORDER — BUTAMBEN-TETRACAINE-BENZOCAINE 2-2-14 % EX AERO
INHALATION_SPRAY | CUTANEOUS | Status: DC | PRN
  Administered 2016-05-21: 2 via TOPICAL

## 2016-05-21 MED ORDER — AMLODIPINE BESYLATE 5 MG PO TABS: 5.0000 mg | ORAL_TABLET | Freq: Every day | ORAL | 0 refills | Status: DC

## 2016-05-21 MED ORDER — FENTANYL CITRATE (PF) 100 MCG/2ML IJ SOLN
INTRAMUSCULAR | Status: DC | PRN
  Administered 2016-05-21: 25 ug via INTRAVENOUS

## 2016-05-21 MED ORDER — SODIUM CHLORIDE 0.9 % IV SOLN
INTRAVENOUS | Status: DC
  Administered 2016-05-21: 500 mL via INTRAVENOUS

## 2016-05-21 MED ORDER — MIDAZOLAM HCL 10 MG/2ML IJ SOLN
INTRAMUSCULAR | Status: DC | PRN
Start: 2016-05-21 — End: 2016-05-21
  Administered 2016-05-21: 2 mg via INTRAVENOUS
  Administered 2016-05-21: 1 mg via INTRAVENOUS

## 2016-05-21 MED ORDER — DIPHENHYDRAMINE HCL 50 MG/ML IJ SOLN
INTRAMUSCULAR | Status: AC
  Filled 2016-05-21: qty 1

## 2016-05-21 MED ORDER — LIDOCAINE-EPINEPHRINE 1 %-1:100000 IJ SOLN
INTRAMUSCULAR | Status: DC | PRN
  Administered 2016-05-21: 10 mL via INTRADERMAL

## 2016-05-21 SURGICAL SUPPLY — 2 items
LOOP REVEAL LINQSYS (Prosthesis & Implant Heart) ×2 IMPLANT
PACK LOOP INSERTION (CUSTOM PROCEDURE TRAY) ×3 IMPLANT

## 2016-05-21 NOTE — Progress Notes (Signed)
  Echocardiogram Echocardiogram Transesophageal has been performed.  Tresa Res 05/21/2016, 3:59 PM

## 2016-05-21 NOTE — H&P (View-Only) (Signed)
PROGRESS NOTE  Nasiya Elvir  [id number] DOB: Apr 22, 1951 DOA: 05/19/2016 PCP: Cathlean Cower, MD  Brief Narrative:   Andreana Sigur is a 65 y.o. 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 She had not seen her PCP since 2014 and ran out of her blood pressure medications two weeks prior to admission.  In addition to the drooping of the left side of her face and slurred speech, she also noticed decreased coordination of her left hand and left leg.  Her blood pressure was elevated 200/111, other VSS.  CT head demonstrated a small area of hypoattenuation in the deep white matter of the right frontal lobe.  Neurology was consulted.  She was outside the window for tPA.  MRI demonstrated numerous acute, subacute, and chronic strokes in bilateral hemispheres suggestive of embolic disease.    Assessment & Plan:   Principal Problem:   Acute embolic stroke [facility]) Active Problems:   CAD (coronary artery disease)   Essential hypertension   Hyperlipidemia   Tobacco abuse   Acute, subacute, and chronic strokes suggestive of embolic disease.  Persistent slurred speech and left facial droop.  High suspicion for a-fib even though it has not been captured on telemetry.   -  MRI brain/MRA head:  Numerous strokes in bilateral hemispheres -  Telemetry:  NSR, no evidence of a-fib > consider LOOP recorder tomorrow  -  CT angio neck:  No carotid stenosis or dissection.  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   -  A1c:  pending -  PT/OT/SLP:  Inpatient rehabilitation recommended. Consult placed -  Neurology assistance appreciated  CAD, patient is chest pain free -  ASA, high dose statin, beta blocker  Essential  hypertension, will start gradually reducing BP -  Will continue ARB and discontinue ACEI.  Dual-therapy is only indicated for end-stage heart failure at this time -  Resume metoprolol -  Add norvasc  Hyperlipidemia, goal LDL < 70.  LDL 165 despite high dose statin and zetia.  -   Referral to cardiology for PCSK9 ANTIBODIES -  Continue atorvastatin and zetia  Depression, stable, continue lexapro  Smoking, rare -  Counseled cessation  DVT prophylaxis: lovenox  Code Status: full Family Communication: patient, her husband and sister-in-law and niece Disposition Plan:   possible to CIR after TEE tomorrow  Consultants:   Neurology  Procedures:  none  Antimicrobials:   none    Subjective:  Ongoing slurred speech and facial droop.  Has not noticed any improvement in her left hand and left leg coordination  Objective: Vitals:   05/20/16 0230 05/20/16 0430 05/20/16 0628 05/20/16 0858  BP: (!) 164/90 (!) 168/88 (!) 162/78 (!) 167/82  Pulse:    61  Resp: 20 20 20 16   Temp: 98.6 F (37 C) 98 F (36.7 C) 98.2 F (36.8 C) 97.8 F (36.6 C)  TempSrc: Oral Oral Oral Oral  SpO2: 98% 99% 98% 94%  Weight:      Height:        Intake/Output Summary (Last 24 hours) at 05/20/16 1342 Last data filed at 05/20/16 [id number]  Gross per 24 hour  Intake  360 ml  Output                0 ml  Net              360 ml   Filed Weights   05/19/16 2030  Weight: 61.2 kg (134 lb 14.7 oz)    Examination:  General exam:  Adult female.  No acute distress.  HEENT:  NCAT, MMM Respiratory system: Clear to auscultation bilaterally Cardiovascular system: Regular rate and rhythm, normal [id number]. 2/6 systolic murmur.  Warm extremities Gastrointestinal system: Normal active bowel sounds, soft, nondistended, nontender. MSK:  Normal tone and bulk, no lower extremity edema Neuro:  Left facial droop, strength 4/5 left upper and lower extremity and dysmetria of left upper and lower  extremities.  Right extremities with 5/5 strength.  Sensation intact to light touch throughout.    Data Reviewed: I have personally reviewed following labs and imaging studies  CBC:  Recent Labs Lab 05/19/16 1444 05/19/16 1610  WBC 9.3  --   NEUTROABS 4.8  --   HGB 14.6 16.0*  HCT 42.6 47.0*  MCV 86.4  --   PLT 157  --    Basic Metabolic Panel:  Recent Labs Lab 05/19/16 1444 05/19/16 1610  NA 136 139  K 3.9 4.1  CL 108 103  CO2 25  --   GLUCOSE 111* 93  BUN 9 9  CREATININE 0.83 0.70  CALCIUM 10.0  --    GFR: Estimated Creatinine Clearance: 58 mL/min (by C-G formula based on SCr of 0.8 mg/dL). Liver Function Tests:  Recent Labs Lab 05/19/16 1444  AST 18  ALT 11*  ALKPHOS 101  BILITOT 0.5  PROT 6.6  ALBUMIN 3.7   No results for input(s): LIPASE, AMYLASE in the last 168 hours. No results for input(s): AMMONIA in the last 168 hours. Coagulation Profile:  Recent Labs Lab 05/19/16 1444  INR 0.95   Cardiac Enzymes: No results for input(s): CKTOTAL, CKMB, CKMBINDEX, TROPONINI in the last 168 hours. BNP (last 3 results) No results for input(s): PROBNP in the last 8760 hours. HbA1C: No results for input(s): HGBA1C in the last 72 hours. CBG: No results for input(s): GLUCAP in the last 168 hours. Lipid Profile:  Recent Labs  05/20/16 0417  CHOL 248*  HDL 54  LDLCALC 165*  TRIG 145  CHOLHDL 4.6   Thyroid Function Tests: No results for input(s): TSH, T4TOTAL, FREET4, T3FREE, THYROIDAB in the last 72 hours. Anemia Panel: No results for input(s): VITAMINB12, FOLATE, FERRITIN, TIBC, IRON, RETICCTPCT in the last 72 hours. Urine analysis:    Component Value Date/Time   COLORURINE LT. YELLOW 05/17/2013 1129   APPEARANCEUR CLEAR 05/17/2013 1129   LABSPEC 1.010 05/17/2013 1129   PHURINE 7.0 05/17/2013 1129   GLUCOSEU NEGATIVE 05/17/2013 1129   HGBUR TRACE-LYSED 05/17/2013 1129   BILIRUBINUR NEGATIVE 05/17/2013 1129   KETONESUR NEGATIVE 05/17/2013  1129   UROBILINOGEN 0.2 05/17/2013 1129   NITRITE NEGATIVE 05/17/2013 1129   LEUKOCYTESUR NEGATIVE 05/17/2013 1129   Sepsis Labs: @LABRCNTIP (procalcitonin:4,lacticidven:4)  )No results found for this or any previous visit (from the past 240 hour(s)).    Radiology Studies: Ct Head Wo Contrast  Result Date: 05/19/2016 CLINICAL DATA:  Left-sided facial droop and weakness. Slight slurred speech. EXAM: CT HEAD WITHOUT CONTRAST TECHNIQUE: Contiguous axial images were obtained from the base of the skull through the vertex without intravenous contrast. COMPARISON:  None. FINDINGS: Brain: No evidence of acute hemorrhage, hydrocephalus, extra-axial collection or mass lesion/mass  effect. There is a small area of hypoattenuation in the deep white matter of the right frontal lobe, bordering the right lateral ventricle, likely representing an age-indeterminate ischemic infarct. Vascular: No hyperdense vessel or unexpected calcification. Skull: No acute fractures. Sinuses/Orbits: No acute finding. Other: None. IMPRESSION: Small area of hypoattenuation in the deep white matter of the right frontal lobe, which likely represents an age-indeterminate ischemic infarct. Alternatively this may represent an area of demyelination. Mild brain parenchymal atrophy. These results were called by telephone at the time of interpretation on 05/19/2016 at 3:46 pm to Dr. Audie Pinto, who verbally acknowledged these results. Electronically Signed   By: Fidela Salisbury M.D.   On: 05/19/2016 15:51   Ct Angio Neck W Or Wo Contrast  Result Date: 05/20/2016 CLINICAL DATA:  Multiple acute infarcts, with symptoms of LEFT facial droop and slurred speech new onset 05/19/2016. EXAM: CT ANGIOGRAPHY NECK TECHNIQUE: Multidetector CT imaging of the neck was performed using the standard protocol during bolus administration of intravenous contrast. Multiplanar CT image reconstructions and MIPs were obtained to evaluate the vascular anatomy. Carotid  stenosis measurements (when applicable) are obtained utilizing NASCET criteria, using the distal internal carotid diameter as the denominator. CONTRAST:  Isovue 370, 50 mL. COMPARISON:  MRI brain and MRA intracranial 05/19/2016. FINDINGS: Aortic arch: Standard branching. Imaged portion shows no evidence of aneurysm or dissection. No significant stenosis of the major arch vessel origins. Mild non stenotic calcific plaque at the origin of the innominate, and LEFT common carotid arteries. Calcific and noncalcific plaque at the origin of LEFT subclavian, estimated 50% stenosis. Right carotid system: Minor calcific plaque. Dolichoectatic cervical ICA. No evidence of dissection, stenosis (50% or greater) or occlusion. Left carotid system: Incidental nonocclusive and non stenotic web, LEFT ICA origin No evidence of dissection, stenosis (50% or greater) or occlusion. Vertebral arteries: Codominant. No evidence of dissection, stenosis (50% or greater) or occlusion. Skeleton: Cervical spondylosis with significant disc space narrowing at C5-6 and C6-7. The patient is edentulous. Other neck: No neck masses. Upper chest: No lung apex lesion.  Minor changes of COPD. IMPRESSION: No carotid bifurcation stenosis or dissection. Minor atheromatous change at the great vessel origins of the innominate and LEFT common carotid artery, with calcific and noncalcific plaque resulting in 50% stenosis LEFT subclavian origin. Electronically Signed   By: Staci Righter M.D.   On: 05/20/2016 09:53   Mr Brain Wo Contrast  Result Date: 05/19/2016 CLINICAL DATA:  65 y/o  F; left facial droop and slurred speech. EXAM: MRI HEAD WITHOUT CONTRAST MRA HEAD WITHOUT CONTRAST TECHNIQUE: Multiplanar, multiecho pulse sequences of the brain and surrounding structures were obtained without intravenous contrast. Angiographic images of the head were obtained using MRA technique without contrast. COMPARISON:  05/19/2016 CT head. FINDINGS: MRI HEAD FINDINGS  Brain: There are small foci of diffusion restriction within the right anterior paramedian superior pons, the right posterior limb of internal capsule, the right mid corona radiata, and the left parietal operculum consistent with acute infarction. There additional foci of diffusion hyperintensity without low ADC in the left splenium of corpus callosum and left occipital lobe which may represent late subacute/chronic area of ischemia. Chronic lacunar infarct within the right inferior pons and probable small old infarct in the right posterior medial frontal lobe. No abnormal susceptibility hypointensity to indicate intracranial hemorrhage. Extra-axial space: No hydrocephalus. No extra-axial collection is identified. Cavum septum pellucidum. Other: Mild maxillary sinus mucosal thickening. No abnormal signal of the mastoid air cells. Orbits are unremarkable. Calvarium is unremarkable.  13 mm Thornwaldt cyst. MRA HEAD FINDINGS Internal carotid arteries: Patent. Anterior cerebral arteries: Patent. Middle cerebral arteries: Patent. Anterior communicating artery: Patent. Posterior communicating arteries: Possible diminutive right. No left identified, hypoplastic or absent. Posterior cerebral arteries: Patent. Basilar artery: Patent. Vertebral arteries: Patent. No evidence of high-grade stenosis, large vessel occlusion, or aneurysm unless noted above. IMPRESSION: 1. Several small acute/early subacute infarctions within the right anterior paramedian superior pons, right posterior limb of internal capsule, right mid corona radiata, and left parietal operculum. Symptoms of left-sided facial droop and slurred speech are probably attributable to the infarction in right posterior limb of internal capsule. 2. Small late subacute to chronic infarcts within the left splenium of corpus callosum, left occipital lobe, and right inferior pons. 3. No acute intracranial hemorrhage. 4. Mild paranasal sinus disease. 5. No occlusion, aneurysm,  dissection, or significant stenosis of the circle of Willis is identified. Electronically Signed   By: Kristine Garbe M.D.   On: 05/19/2016 21:46   Mr Jodene Nam Head/brain [id number] Cm  Result Date: 05/19/2016 CLINICAL DATA:  65 y/o  F; left facial droop and slurred speech. EXAM: MRI HEAD WITHOUT CONTRAST MRA HEAD WITHOUT CONTRAST TECHNIQUE: Multiplanar, multiecho pulse sequences of the brain and surrounding structures were obtained without intravenous contrast. Angiographic images of the head were obtained using MRA technique without contrast. COMPARISON:  05/19/2016 CT head. FINDINGS: MRI HEAD FINDINGS Brain: There are small foci of diffusion restriction within the right anterior paramedian superior pons, the right posterior limb of internal capsule, the right mid corona radiata, and the left parietal operculum consistent with acute infarction. There additional foci of diffusion hyperintensity without low ADC in the left splenium of corpus callosum and left occipital lobe which may represent late subacute/chronic area of ischemia. Chronic lacunar infarct within the right inferior pons and probable small old infarct in the right posterior medial frontal lobe. No abnormal susceptibility hypointensity to indicate intracranial hemorrhage. Extra-axial space: No hydrocephalus. No extra-axial collection is identified. Cavum septum pellucidum. Other: Mild maxillary sinus mucosal thickening. No abnormal signal of the mastoid air cells. Orbits are unremarkable. Calvarium is unremarkable. 13 mm Thornwaldt cyst. MRA HEAD FINDINGS Internal carotid arteries: Patent. Anterior cerebral arteries: Patent. Middle cerebral arteries: Patent. Anterior communicating artery: Patent. Posterior communicating arteries: Possible diminutive right. No left identified, hypoplastic or absent. Posterior cerebral arteries: Patent. Basilar artery: Patent. Vertebral arteries: Patent. No evidence of high-grade stenosis, large vessel occlusion, or  aneurysm unless noted above. IMPRESSION: 1. Several small acute/early subacute infarctions within the right anterior paramedian superior pons, right posterior limb of internal capsule, right mid corona radiata, and left parietal operculum. Symptoms of left-sided facial droop and slurred speech are probably attributable to the infarction in right posterior limb of internal capsule. 2. Small late subacute to chronic infarcts within the left splenium of corpus callosum, left occipital lobe, and right inferior pons. 3. No acute intracranial hemorrhage. 4. Mild paranasal sinus disease. 5. No occlusion, aneurysm, dissection, or significant stenosis of the circle of Willis is identified. Electronically Signed   By: Kristine Garbe M.D.   On: 05/19/2016 21:46     Scheduled Meds: .  stroke: mapping our early stages of recovery book   Does not apply Once  . amLODipine  5 mg Oral Daily  . aspirin  300 mg Rectal Daily   Or  . aspirin  325 mg Oral Daily  . atorvastatin  80 mg Oral q1800  . enoxaparin (LOVENOX) injection  40 mg Subcutaneous Q24H  . escitalopram  10 mg Oral Daily  . ezetimibe  10 mg Oral Daily  . irbesartan  300 mg Oral Daily  . metoprolol  50 mg Oral BID   Continuous Infusions: . sodium chloride       LOS: 0 days    Time spent: 30 min    Janece Canterbury, MD [facility] Pager [phone]  If 7PM-7AM, please contact night-coverage [email or web address] Password [email or web address] 05/20/2016, 1:42 PM

## 2016-05-21 NOTE — Interval H&P Note (Signed)
History and Physical Interval Note:  05/21/2016 3:17 PM  Elizabeth [name]  has presented today for surgery, with the diagnosis of stroke  The various methods of treatment have been discussed with the patient and family. After consideration of risks, benefits and other options for treatment, the patient has consented to  Procedure(s): TRANSESOPHAGEAL ECHOCARDIOGRAM (TEE) (N/A) as a surgical intervention .  The patient's history has been reviewed, patient examined, no change in status, stable for surgery.  I have reviewed the patient's chart and labs.  Questions were answered to the patient's satisfaction.     Kirk Ruths

## 2016-05-21 NOTE — Progress Notes (Signed)
STROKE TEAM PROGRESS NOTE   SUBJECTIVE (INTERVAL HISTORY) Husband at bedside. She had TTE and pending TEE and loop. Pt agree with CIR.   OBJECTIVE Temp:  [98 F (36.7 C)-98.9 F (37.2 C)] 98.2 F (36.8 C) (08/29 1302) Pulse Rate:  [50-76] 58 (08/29 1302) Cardiac Rhythm: Normal sinus rhythm (08/29 0700) Resp:  [16-20] 16 (08/29 1302) BP: (133-167)/(60-91) 137/82 (08/29 1302) SpO2:  [97 %-100 %] 100 % (08/29 1302)  CBC:   Recent Labs Lab 05/19/16 1444 05/19/16 1610  WBC 9.3  --   NEUTROABS 4.8  --   HGB 14.6 16.0*  HCT 42.6 47.0*  MCV 86.4  --   PLT 157  --     Basic Metabolic Panel:   Recent Labs Lab 05/19/16 1444 05/19/16 1610  NA 136 139  K 3.9 4.1  CL 108 103  CO2 25  --   GLUCOSE 111* 93  BUN 9 9  CREATININE 0.83 0.70  CALCIUM 10.0  --     Lipid Panel:     Component Value Date/Time   CHOL 248 (H) 05/20/2016 0417   TRIG 145 05/20/2016 0417   HDL 54 05/20/2016 0417   CHOLHDL 4.6 05/20/2016 0417   VLDL 29 05/20/2016 0417   LDLCALC 165 (H) 05/20/2016 0417   HgbA1c:  Lab Results  Component Value Date   HGBA1C 5.5 05/20/2016   Urine Drug Screen:     Component Value Date/Time   LABOPIA NONE DETECTED 05/20/2016 0818   COCAINSCRNUR NONE DETECTED 05/20/2016 0818   LABBENZ NONE DETECTED 05/20/2016 0818   AMPHETMU NONE DETECTED 05/20/2016 0818   THCU NONE DETECTED 05/20/2016 0818   LABBARB NONE DETECTED 05/20/2016 0818      IMAGING I have personally reviewed the radiological images below and agree with the radiology interpretations.  Ct Head Wo Contrast 05/19/2016 Small area of hypoattenuation in the deep white matter of the right frontal lobe, which likely represents an age-indeterminate ischemic infarct. Alternatively this may represent an area of demyelination. Mild brain parenchymal atrophy.   Ct Angio Neck W Or Wo Contrast 05/20/2016 No carotid bifurcation stenosis or dissection. Minor atheromatous change at the great vessel origins of the  innominate and LEFT common carotid artery, with calcific and noncalcific plaque resulting in 50% stenosis LEFT subclavian origin.  Mr Brain Wo Contrast Mr Jodene Nam Head/brain Wo Cm 05/19/2016 1. Several small acute/early subacute infarctions within the right anterior paramedian superior pons, right posterior limb of internal capsule, right mid corona radiata, and left parietal operculum.  2. Small late subacute to chronic infarcts within the left splenium of corpus callosum, left occipital lobe, and right inferior pons. 3. No acute intracranial hemorrhage. 4. Mild paranasal sinus disease. 5. No occlusion, aneurysm, dissection, or significant stenosis of the circle of Willis is identified.   Transcranial Doppler with Monitoring - no HITS.  Lower extremity venous duplex: No evidence of DVT or baker's cyst.  TTE - Left ventricle: The cavity size was normal. Wall thickness was   normal. Systolic function was normal. The estimated ejection   fraction was in the range of 60% to 65%. Wall motion was normal;   there were no regional wall motion abnormalities. Doppler   parameters are consistent with abnormal left ventricular   relaxation (grade 1 diastolic dysfunction).  TEE - Normal LV systolic function Negative saline microcavitation study   PHYSICAL EXAM  Temp:  [98 F (36.7 C)-98.9 F (37.2 C)] 98.2 F (36.8 C) (08/29 1302) Pulse Rate:  [50-76] 58 (08/29  1302) Resp:  [16-20] 16 (08/29 1302) BP: (133-167)/(60-91) 137/82 (08/29 1302) SpO2:  [97 %-100 %] 100 % (08/29 1302)  General - Well nourished, well developed, in no apparent distress.  Ophthalmologic - Sharp disc margins OU.   Cardiovascular - Regular rate and rhythm.  Mental Status -  Level of arousal and orientation to time, place, and person were intact. Language including expression, naming, repetition, comprehension was assessed and found intact. Fund of Knowledge was assessed and was intact.  Cranial Nerves II - XII - II -  Visual field intact OU. III, IV, VI - Extraocular movements intact. V - Facial sensation intact bilaterally. VII - mild left facial droop. VIII - Hearing & vestibular intact bilaterally. X - Palate elevates symmetrically. XI - Chin turning & shoulder shrug intact bilaterally. XII - Tongue protrusion intact.  Motor Strength - The patient's strength was normal in all extremities except left hand decreased dexterity and pronator drift was present.  Bulk was normal and fasciculations were absent.   Motor Tone - Muscle tone was assessed at the neck and appendages and was normal.  Reflexes - The patient's reflexes were 1+ in all extremities and she had no pathological reflexes.  Sensory - Light touch, temperature/pinprick were assessed and were symmetrical.    Coordination - The patient had normal movements in the hands with no ataxia or dysmetria.  Tremor was absent.  Gait and Station - deferred.   ASSESSMENT/PLAN Elizabeth [name] is a 65 y.o. Left handed female with history of coronary artery disease status post MI 3 with 3 drug-eluting stents, hypertension, hyperlipidemia, anxiety, arthritis, sleep sickle cell trait, and active smoking, presenting with left facial droop and blurring of vision in left eye. She did not receive IV t-PA due to delay in arrival.   Stroke:  Right anterior paramedian pontine, right posterior limb of internal capsule, right mid corona radiata and left parietal operculum infarcts (multiple anterior and posterior vascular territories) embolic secondary to unknown source  MRI  Right anterior paramedian pontine, right posterior limb of internal capsule, right mid corona radiata and left parietal operculum infarcts.  MRA head no significant intracranial atherosclerosis   CTA neck no significant large vessel stenosis  2D Echo  unremarkable  LE dopplers negative for DVT  TCD MES negative for emboli  TEE unremarkable.  Loop recorder placed.    LDL  165  HgbA1c 5.5  Lovenox 40 mg sq daily for VTE prophylaxis Diet NPO time specified  aspirin 81 mg daily prior to admission, now on aspirin 325 mg daily. Continue ASA on discharge.   Patient counseled to be compliant with her antithrombotic medications  Ongoing aggressive stroke risk factor management  Therapy recommendations:  CIR  Disposition:  pending   Hypertensive emergency  Blood pressure 200/11 on arrival in setting of new neurologic symptoms  Stabilizing, SBP 160s  Permissive hypertension (OK if < 220/120) but gradually normalize in 5-7 days  Long-term BP goal normotensive  Hyperlipidemia  Home meds:  lipitor 80 and zetia 10, resumed in hospital  LDL 165, goal < 70  Continue statin at discharge  Tobacco abuse  Current smoker  Smoking cessation counseling provided  Pt is willing to quit  Other Stroke Risk Factors  Advanced age  Coronary artery disease - MI x 3, S/p DES x 3  Sickle cell trait  Other Active Problems  Depression   Hospital day # 1  Neurology will sign off. Please call with questions. Pt will follow up with Dr.  Makayela Secrest at [facility] in about 2 months. Thanks for the consult.  Rosalin Hawking, MD PhD Stroke Neurology 05/21/2016 9:35 PM   To contact Stroke Continuity provider, please refer to [email or web address] After hours, contact General Neurology

## 2016-05-21 NOTE — Consult Note (Signed)
ELECTROPHYSIOLOGY CONSULT NOTE  Patient ID: Elizabeth [name] MRN: [id number], DOB/AGE: [id number]   Admit date: 05/19/2016 Date of Consult: 05/21/2016  Primary Physician: Cathlean Cower, MD Primary Cardiologist: previously Dr Ron Parker (last seen 2012) Reason for Consultation: Cryptogenic stroke; recommendations regarding Implantable Loop Recorder  History of Present Illness Elizabeth [name] was admitted on 05/19/2016 with slurred speech and left sided facial weakness.  They first developed symptoms while at church the day of admission with progressive symptoms.  Imaging demonstrated multiple anterior and posterior vascular territory infarcts felt to be embolic 2/2 unknown source.  She has undergone workup for stroke including echocardiogram and carotid dopplers.  The patient has been monitored on telemetry which has demonstrated sinus rhythm with no arrhythmias.  Inpatient stroke work-up is to be completed with a TEE.   Lab work is reviewed.  Echo is pending.   Prior to admission, the patient states that she has had intermittent palpitations that last seconds without clear triggers but denies chest pain, shortness of breath, dizziness,  or syncope.  They are recovering from their stroke. Recommendation is for CIR, but the patient would really like to go home.   EP has been asked to evaluate for placement of an implantable loop recorder to monitor for atrial fibrillation.  Past Medical History:  Diagnosis Date  . Abrasion or friction burn of foot and toe(s), with infection   . Allergy    soy  . Anxiety   . 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 Degenerative joint disease    Generalized  . Hyperlipidemia   . Hypertension   . Myocardial infarction ([place])   . Sickle cell trait ([place])   . Tobacco abuse April, 2011   Continued     Surgical History:  Past Surgical History:  Procedure Laterality Date  .  ANGIOPLASTY  2006   x3 stents  . DILATION AND CURETTAGE OF UTERUS    . FOOT SURGERY  2000  . LITHOTRIPSY  2008   left kidney stone and also ureteral stent placed     Prescriptions Prior to Admission  Medication Sig Dispense Refill Last Dose  . aspirin 81 MG EC tablet Take 81 mg by mouth 2 (two) times daily.     05/19/2016 at Unknown time  . atorvastatin (LIPITOR) 80 MG tablet TAKE 1 TABLET (80 MG TOTAL) BY MOUTH DAILY. (Patient not taking: Reported on 05/19/2016) 90 tablet 3 Not Taking at Unknown time  . enalapril (VASOTEC) 10 MG tablet Take 1 tablet (10 mg total) by mouth 2 (two) times daily. (Patient not taking: Reported on 05/19/2016) 60 tablet 11 Not Taking at Unknown time  . escitalopram (LEXAPRO) 10 MG tablet Take 1 tablet (10 mg total) by mouth daily. (Patient not taking: Reported on 05/19/2016) 90 tablet 3 Not Taking at Unknown time  . ezetimibe (ZETIA) 10 MG tablet Take 1 tablet (10 mg total) by mouth daily. (Patient not taking: Reported on 05/19/2016) 90 tablet 3 Not Taking at Unknown time  . irbesartan (AVAPRO) 300 MG tablet Take 1 tablet (300 mg total) by mouth daily. (Patient not taking: Reported on 05/19/2016) 90 tablet 3 Not Taking at Unknown time  . metoprolol (LOPRESSOR) 50 MG tablet Take 1 tablet (50 mg total) by mouth 2 (two) times daily. (Patient not taking: Reported on 05/19/2016) 180 tablet 3 Not Taking at Unknown time  . nitroGLYCERIN (NITROLINGUAL) 0.4 MG/SPRAY spray  Place 1 spray under the tongue every 5 (five) minutes as needed. May repeat up to 3 doses. 12 g 3 Unknown at Unknown    Inpatient Medications:  .  stroke: mapping our early stages of recovery book   Does not apply Once  . amLODipine  5 mg Oral Daily  . aspirin  300 mg Rectal Daily   Or  . aspirin  325 mg Oral Daily  . atorvastatin  80 mg Oral q1800  . enoxaparin (LOVENOX) injection  40 mg Subcutaneous Q24H  . escitalopram  10 mg Oral Daily  . ezetimibe  10 mg Oral Daily  . irbesartan  300 mg Oral Daily  .  metoprolol  50 mg Oral BID    Allergies:  Allergies  Allergen Reactions  . Shrimp [Shellfish Allergy] Nausea And Vomiting  . Adhesive [Tape] Other (See Comments)    Unknown  . Soy Allergy Nausea And Vomiting    Social History   Social History  . Marital status: Married    Spouse name: N/A  . Number of children: N/A  . Years of education: N/A   Occupational History  . Not on file.   Social History Main Topics  . Smoking status: Current Some Day Smoker    Types: Cigarettes  . Smokeless tobacco: Never Used     Comment: Ongoing since 04/2005  . Alcohol use No  . Drug use: No  . Sexual activity: Not on file   Other Topics Concern  . Not on file   Social History Narrative  . No narrative on file     Family History  Problem Relation Age of Onset  . Coronary artery disease Mother   . Coronary artery disease Father      Review of Systems: All other systems reviewed and are otherwise negative except as noted above.  Physical Exam: Vitals:   05/20/16 1657 05/20/16 2153 05/21/16 0128 05/21/16 0530  BP: (!) 167/80 (!) 149/78 (!) 153/85 (!) 145/85  Pulse: (!) 50 69 62 65  Resp: 20 18 18 16   Temp: 98.2 F (36.8 C) 98.4 F (36.9 C) 98.9 F (37.2 C) 98.3 F (36.8 C)  TempSrc: Oral Oral Oral Oral  SpO2: 100% 98% 97% 97%  Weight:      Height:        GEN- The patient is well appearing, alert and oriented x 3 today.   Head- normocephalic, atraumatic Eyes-  Sclera clear, conjunctiva pink Ears- hearing intact Oropharynx- clear Neck- supple Lungs- Clear to ausculation bilaterally, normal work of breathing Heart- Regular rate and rhythm  GI- soft, NT, ND, + BS Extremities- no clubbing, cyanosis, or edema MS- no significant deformity or atrophy Skin- no rash or lesion Psych- euthymic mood, full affect   Labs:   Lab Results  Component Value Date   WBC 9.3 05/19/2016   HGB 16.0 (H) 05/19/2016   HCT 47.0 (H) 05/19/2016   MCV 86.4 05/19/2016   PLT 157  05/19/2016    Recent Labs Lab 05/19/16 1444 05/19/16 1610  NA 136 139  K 3.9 4.1  CL 108 103  CO2 25  --   BUN 9 9  CREATININE 0.83 0.70  CALCIUM 10.0  --   PROT 6.6  --   BILITOT 0.5  --   ALKPHOS 101  --   ALT 11*  --   AST 18  --   GLUCOSE 111* 93     Radiology/Studies: Ct Head Wo Contrast Result Date: 05/19/2016 CLINICAL DATA:  Left-sided facial droop and weakness. Slight slurred speech. EXAM: CT HEAD WITHOUT CONTRAST TECHNIQUE: Contiguous axial images were obtained from the base of the skull through the vertex without intravenous contrast. COMPARISON:  None. FINDINGS: Brain: No evidence of acute hemorrhage, hydrocephalus, extra-axial collection or mass lesion/mass effect. There is a small area of hypoattenuation in the deep white matter of the right frontal lobe, bordering the right lateral ventricle, likely representing an age-indeterminate ischemic infarct. Vascular: No hyperdense vessel or unexpected calcification. Skull: No acute fractures. Sinuses/Orbits: No acute finding. Other: None. IMPRESSION: Small area of hypoattenuation in the deep white matter of the right frontal lobe, which likely represents an age-indeterminate ischemic infarct. Alternatively this may represent an area of demyelination. Mild brain parenchymal atrophy. These results were called by telephone at the time of interpretation on 05/19/2016 at 3:46 pm to Dr. Audie Pinto, who verbally acknowledged these results. Electronically Signed   By: Fidela Salisbury M.D.   On: 05/19/2016 15:51   Ct Angio Neck W Or Wo Contrast Result Date: 05/20/2016 CLINICAL DATA:  Multiple acute infarcts, with symptoms of LEFT facial droop and slurred speech new onset 05/19/2016. EXAM: CT ANGIOGRAPHY NECK TECHNIQUE: Multidetector CT imaging of the neck was performed using the standard protocol during bolus administration of intravenous contrast. Multiplanar CT image reconstructions and MIPs were obtained to evaluate the vascular anatomy.  Carotid stenosis measurements (when applicable) are obtained utilizing NASCET criteria, using the distal internal carotid diameter as the denominator. CONTRAST:  Isovue 370, 50 mL. COMPARISON:  MRI brain and MRA intracranial 05/19/2016. FINDINGS: Aortic arch: Standard branching. Imaged portion shows no evidence of aneurysm or dissection. No significant stenosis of the major arch vessel origins. Mild non stenotic calcific plaque at the origin of the innominate, and LEFT common carotid arteries. Calcific and noncalcific plaque at the origin of LEFT subclavian, estimated 50% stenosis. Right carotid system: Minor calcific plaque. Dolichoectatic cervical ICA. No evidence of dissection, stenosis (50% or greater) or occlusion. Left carotid system: Incidental nonocclusive and non stenotic web, LEFT ICA origin No evidence of dissection, stenosis (50% or greater) or occlusion. Vertebral arteries: Codominant. No evidence of dissection, stenosis (50% or greater) or occlusion. Skeleton: Cervical spondylosis with significant disc space narrowing at C5-6 and C6-7. The patient is edentulous. Other neck: No neck masses. Upper chest: No lung apex lesion.  Minor changes of COPD. IMPRESSION: No carotid bifurcation stenosis or dissection. Minor atheromatous change at the great vessel origins of the innominate and LEFT common carotid artery, with calcific and noncalcific plaque resulting in 50% stenosis LEFT subclavian origin. Electronically Signed   By: Staci Righter M.D.   On: 05/20/2016 09:53   Mr Brain Wo Contrast Result Date: 05/19/2016 CLINICAL DATA:  65 y/o  F; left facial droop and slurred speech. EXAM: MRI HEAD WITHOUT CONTRAST MRA HEAD WITHOUT CONTRAST TECHNIQUE: Multiplanar, multiecho pulse sequences of the brain and surrounding structures were obtained without intravenous contrast. Angiographic images of the head were obtained using MRA technique without contrast. COMPARISON:  05/19/2016 CT head. FINDINGS: MRI HEAD FINDINGS  Brain: There are small foci of diffusion restriction within the right anterior paramedian superior pons, the right posterior limb of internal capsule, the right mid corona radiata, and the left parietal operculum consistent with acute infarction. There additional foci of diffusion hyperintensity without low ADC in the left splenium of corpus callosum and left occipital lobe which may represent late subacute/chronic area of ischemia. Chronic lacunar infarct within the right inferior pons and probable small old infarct in the  right posterior medial frontal lobe. No abnormal susceptibility hypointensity to indicate intracranial hemorrhage. Extra-axial space: No hydrocephalus. No extra-axial collection is identified. Cavum septum pellucidum. Other: Mild maxillary sinus mucosal thickening. No abnormal signal of the mastoid air cells. Orbits are unremarkable. Calvarium is unremarkable. 13 mm Thornwaldt cyst. MRA HEAD FINDINGS Internal carotid arteries: Patent. Anterior cerebral arteries: Patent. Middle cerebral arteries: Patent. Anterior communicating artery: Patent. Posterior communicating arteries: Possible diminutive right. No left identified, hypoplastic or absent. Posterior cerebral arteries: Patent. Basilar artery: Patent. Vertebral arteries: Patent. No evidence of high-grade stenosis, large vessel occlusion, or aneurysm unless noted above. IMPRESSION: 1. Several small acute/early subacute infarctions within the right anterior paramedian superior pons, right posterior limb of internal capsule, right mid corona radiata, and left parietal operculum. Symptoms of left-sided facial droop and slurred speech are probably attributable to the infarction in right posterior limb of internal capsule. 2. Small late subacute to chronic infarcts within the left splenium of corpus callosum, left occipital lobe, and right inferior pons. 3. No acute intracranial hemorrhage. 4. Mild paranasal sinus disease. 5. No occlusion, aneurysm,  dissection, or significant stenosis of the circle of Willis is identified. Electronically Signed   By: Kristine Garbe M.D.   On: 05/19/2016 21:46   12-lead ECG sinus rhythm All prior EKG's in EPIC reviewed with no documented atrial fibrillation  Telemetry sinus rhythm  Assessment and Plan:  1. Cryptogenic stroke The patient presents with cryptogenic stroke.  The patient has a TEE planned for later today.  I spoke at length with the patient about monitoring for afib with an implantable loop recorder.  Risks, benefits, and alteratives to implantable loop recorder were discussed with the patient today.   At this time, the patient is very clear in their decision to proceed with implantable loop recorder.   Wound care was reviewed with the patient (keep incision clean and dry for 3 days).  Wound check scheduled for 05/30/16 at 3PM   Please call with questions.   Chanetta Marshall, NP 05/21/2016 8:38 AM  I have seen, examined the patient, and reviewed the above assessment and plan. On exam, RRR. Changes to above are made where necessary.    Co Sign: Thompson Grayer, MD 05/21/2016

## 2016-05-21 NOTE — CV Procedure (Signed)
See full TEE report in [employer] Pt sedated with versed 3 mg and fentanyl 25 micrograms IV. Normal LV systolic function Negative saline microcavitation study Full report to follow Kirk Ruths

## 2016-05-21 NOTE — Discharge Summary (Addendum)
Physician Discharge Summary  Elizabeth [name] [id number] DOB: 10-30-1950 DOA: 05/19/2016  PCP: Cathlean Cower, MD  Admit date: 05/19/2016 Discharge date: 06/16/2016  Admitted From: home  Disposition:  CIR  Recommendations for Outpatient Follow-up:  1. Follow up with PCP in 1-2 weeks for blood pressure management 2. Neurology in 1 months 3. Wound check 05/30/2016 at 3pm at cardiology clinic   4. 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 She had not seen her PCP since 2014 and ran out of her blood pressure medications two weeks prior to admission. In addition to the drooping of the left side of her face and slurred speech, she also noticed decreased coordination of her left hand and left leg.  Her blood pressure was elevated 200/111, other VSS. CT head demonstrated a small area of hypoattenuation in the deep white matter of the right frontal lobe. Neurology was consulted.  She was outside the window for tPA.  MRI demonstrated numerous acute, subacute, and chronic strokes in bilateral hemispheres suggestive of embolic disease.    Discharge Diagnoses:  Principal Problem:   Acute embolic stroke [facility]) Active Problems:   CAD (coronary artery disease)   Benign essential HTN   Hyperlipidemia   Tobacco abuse   Slurred speech   Dysarthria, post-stroke   Sickle cell trait (HCC)   Acute CVA (cerebrovascular accident) ([place])   Facial droop   Adjustment disorder with anxious mood   Left hemiparesis (HCC)  Acute, subacute, and chronic strokes suggestive of embolic disease.   Persistent slurred speech and left facial droop and mild left hemiparesis.  Patient does not have hemiplegia.  High suspicion for a-fib even though it has not been captured on telemetry.   -  MRI brain/MRA head:  Numerous strokes in bilateral hemispheres - Telemetry:  NSR, no evidence of a-fib  -   LOOP recorder placed on 05/21/2016 - CT angio neck:  No carotid stenosis or dissection.  50% stenosis of the left subclavian origin - TEE completed 8/29 > normal EF and no obvious valvular abnl.  No intraatrial communication noted -  Transcranial doppler x 30 minutes:  No HITS -  Lower extremity duplex:  No DVT -  Continue Aspirin 325mg  daily - Lipid panel:  See below.   - A1c:  5.5 - PT/OT/SLP:  Inpatient rehabilitation recommended.  -  PM&R consult appreciated - Neurology assistance appreciated  CAD, patient is chest pain free - ASA, high dose statin, beta blocker  Essential hypertension, blood pressure has trended down - Continue ARB and discontinue ACEI. Dual-therapy is only indicated for end-stage heart failure at this time - Resumed metoprolol -  Added norvasc  Hyperlipidemia, goal LDL < 70.  LDL 165.  Had not been compliant with cholesterol medications.  -   Referral to cardiology for PCSK9 ANTIBODIESif cholesterol not improving - Continue atorvastatin and zetia  Depression, stable, continue lexapro  Smoking,Counseled cessation  Discharge Instructions  Discharge Instructions    Ambulatory referral to Neurology    Complete by:  As directed    Pt will follow up with Dr. Erlinda Hong at [facility] in about 2 months. Thanks.   Call MD  for:  difficulty breathing, headache or visual disturbances    Complete by:  As directed    Call MD for:  extreme fatigue    Complete by:  As directed    Call MD for:  hives    Complete by:  As directed    Call MD for:  persistant dizziness or light-headedness    Complete by:  As directed    Call MD for:  persistant nausea and vomiting     Complete by:  As directed    Call MD for:  redness, tenderness, or signs of infection (pain, swelling, redness, odor or green/yellow discharge around incision site)    Complete by:  As directed    Call MD for:  severe uncontrolled pain    Complete by:  As directed    Call MD for:  temperature >100.4    Complete by:  As directed    Diet - low sodium heart healthy    Complete by:  As directed    Discharge wound care:    Complete by:  As directed    Keep loop recorder incision clean and dry for 3 days.   Increase activity slowly    Complete by:  As directed        Medication List    STOP taking these medications   aspirin 81 MG EC tablet Replaced by:  aspirin 325 MG tablet   atorvastatin 80 MG tablet Commonly known as:  LIPITOR   enalapril 10 MG tablet Commonly known as:  VASOTEC   escitalopram 10 MG tablet Commonly known as:  LEXAPRO   ezetimibe 10 MG tablet Commonly known as:  ZETIA   irbesartan 300 MG tablet Commonly known as:  AVAPRO   metoprolol 50 MG tablet Commonly known as:  LOPRESSOR     TAKE these medications   aspirin 325 MG tablet Take 1 tablet (325 mg total) by mouth daily. Replaces:  aspirin 81 MG EC tablet       Follow-up Information    [facility]. Schedule an appointment as soon as possible for a visit in 1 month(s).   Why:  for cholesterol treatments Contact information: [place] [phone]       Cathlean Cower, MD Follow up in 2 week(s).   Specialties:  Internal Medicine, Radiology Contact information: [place] [address] [phone]        [facility] Follow up on 05/30/2016.   Specialty:  Cardiology Why:  at [facility] for wound check  Contact information: [address], [place], [place]. Schedule an appointment as soon as  possible for a visit in 2 month(s).   Specialty:  Neurology Contact information: [address] [place] [address] [phone]          Allergies  Allergen Reactions  . Shrimp [Shellfish Allergy] Nausea And Vomiting  . Adhesive [Tape] Other (See Comments)    Unknown  . Soy Allergy Nausea And Vomiting    Consultations:  Neurology  Cardiology for TEE and loop recorder PM&R  Procedures/Studies: Ct Head Wo Contrast  Result Date: 05/19/2016 CLINICAL DATA:  Left-sided facial droop and weakness. Slight slurred speech. EXAM: CT HEAD WITHOUT CONTRAST TECHNIQUE: Contiguous axial images were obtained from the base of the skull through the vertex without intravenous contrast. COMPARISON:  None. FINDINGS: Brain:  No evidence of acute hemorrhage, hydrocephalus, extra-axial collection or mass lesion/mass effect. There is a small area of hypoattenuation in the deep white matter of the right frontal lobe, bordering the right lateral ventricle, likely representing an age-indeterminate ischemic infarct. Vascular: No hyperdense vessel or unexpected calcification. Skull: No acute fractures. Sinuses/Orbits: No acute finding. Other: None. IMPRESSION: Small area of hypoattenuation in the deep white matter of the right frontal lobe, which likely represents an age-indeterminate ischemic infarct. Alternatively this may represent an area of demyelination. Mild brain parenchymal atrophy. These results were called by telephone at the time of interpretation on 05/19/2016 at 3:46 pm to Dr. Audie Pinto, who verbally acknowledged these results. Electronically Signed   By: Fidela Salisbury M.D.   On: 05/19/2016 15:51   Ct Angio Neck W Or Wo Contrast  Result Date: 05/20/2016 CLINICAL DATA:  Multiple acute infarcts, with symptoms of LEFT facial droop and slurred speech new onset 05/19/2016. EXAM: CT ANGIOGRAPHY NECK TECHNIQUE: Multidetector CT imaging of the neck was performed using the standard protocol  during bolus administration of intravenous contrast. Multiplanar CT image reconstructions and MIPs were obtained to evaluate the vascular anatomy. Carotid stenosis measurements (when applicable) are obtained utilizing NASCET criteria, using the distal internal carotid diameter as the denominator. CONTRAST:  Isovue 370, 50 mL. COMPARISON:  MRI brain and MRA intracranial 05/19/2016. FINDINGS: Aortic arch: Standard branching. Imaged portion shows no evidence of aneurysm or dissection. No significant stenosis of the major arch vessel origins. Mild non stenotic calcific plaque at the origin of the innominate, and LEFT common carotid arteries. Calcific and noncalcific plaque at the origin of LEFT subclavian, estimated 50% stenosis. Right carotid system: Minor calcific plaque. Dolichoectatic cervical ICA. No evidence of dissection, stenosis (50% or greater) or occlusion. Left carotid system: Incidental nonocclusive and non stenotic web, LEFT ICA origin No evidence of dissection, stenosis (50% or greater) or occlusion. Vertebral arteries: Codominant. No evidence of dissection, stenosis (50% or greater) or occlusion. Skeleton: Cervical spondylosis with significant disc space narrowing at C5-6 and C6-7. The patient is edentulous. Other neck: No neck masses. Upper chest: No lung apex lesion.  Minor changes of COPD. IMPRESSION: No carotid bifurcation stenosis or dissection. Minor atheromatous change at the great vessel origins of the innominate and LEFT common carotid artery, with calcific and noncalcific plaque resulting in 50% stenosis LEFT subclavian origin. Electronically Signed   By: Staci Righter M.D.   On: 05/20/2016 09:53   Mr Brain Wo Contrast  Result Date: 05/19/2016 CLINICAL DATA:  65 y/o  F; left facial droop and slurred speech. EXAM: MRI HEAD WITHOUT CONTRAST MRA HEAD WITHOUT CONTRAST TECHNIQUE: Multiplanar, multiecho pulse sequences of the brain and surrounding structures were obtained without intravenous  contrast. Angiographic images of the head were obtained using MRA technique without contrast. COMPARISON:  05/19/2016 CT head. FINDINGS: MRI HEAD FINDINGS Brain: There are small foci of diffusion restriction within the right anterior paramedian superior pons, the right posterior limb of internal capsule, the right mid corona radiata, and the left parietal operculum consistent with acute infarction. There additional foci of diffusion hyperintensity without low ADC in the left splenium of corpus callosum and left occipital lobe which may represent late subacute/chronic area of ischemia. Chronic lacunar infarct within the right inferior pons and probable small old infarct in the right posterior medial frontal lobe. No abnormal susceptibility hypointensity to indicate intracranial hemorrhage. Extra-axial space: No hydrocephalus. No extra-axial collection is identified. Cavum septum pellucidum. Other: Mild maxillary sinus mucosal thickening. No abnormal signal  of the mastoid air cells. Orbits are unremarkable. Calvarium is unremarkable. 13 mm Thornwaldt cyst. MRA HEAD FINDINGS Internal carotid arteries: Patent. Anterior cerebral arteries: Patent. Middle cerebral arteries: Patent. Anterior communicating artery: Patent. Posterior communicating arteries: Possible diminutive right. No left identified, hypoplastic or absent. Posterior cerebral arteries: Patent. Basilar artery: Patent. Vertebral arteries: Patent. No evidence of high-grade stenosis, large vessel occlusion, or aneurysm unless noted above. IMPRESSION: 1. Several small acute/early subacute infarctions within the right anterior paramedian superior pons, right posterior limb of internal capsule, right mid corona radiata, and left parietal operculum. Symptoms of left-sided facial droop and slurred speech are probably attributable to the infarction in right posterior limb of internal capsule. 2. Small late subacute to chronic infarcts within the left splenium of  corpus callosum, left occipital lobe, and right inferior pons. 3. No acute intracranial hemorrhage. 4. Mild paranasal sinus disease. 5. No occlusion, aneurysm, dissection, or significant stenosis of the circle of Willis is identified. Electronically Signed   By: Kristine Garbe M.D.   On: 05/19/2016 21:46   Mr Jodene Nam Head/brain [id number] Cm  Result Date: 05/19/2016 CLINICAL DATA:  65 y/o  F; left facial droop and slurred speech. EXAM: MRI HEAD WITHOUT CONTRAST MRA HEAD WITHOUT CONTRAST TECHNIQUE: Multiplanar, multiecho pulse sequences of the brain and surrounding structures were obtained without intravenous contrast. Angiographic images of the head were obtained using MRA technique without contrast. COMPARISON:  05/19/2016 CT head. FINDINGS: MRI HEAD FINDINGS Brain: There are small foci of diffusion restriction within the right anterior paramedian superior pons, the right posterior limb of internal capsule, the right mid corona radiata, and the left parietal operculum consistent with acute infarction. There additional foci of diffusion hyperintensity without low ADC in the left splenium of corpus callosum and left occipital lobe which may represent late subacute/chronic area of ischemia. Chronic lacunar infarct within the right inferior pons and probable small old infarct in the right posterior medial frontal lobe. No abnormal susceptibility hypointensity to indicate intracranial hemorrhage. Extra-axial space: No hydrocephalus. No extra-axial collection is identified. Cavum septum pellucidum. Other: Mild maxillary sinus mucosal thickening. No abnormal signal of the mastoid air cells. Orbits are unremarkable. Calvarium is unremarkable. 13 mm Thornwaldt cyst. MRA HEAD FINDINGS Internal carotid arteries: Patent. Anterior cerebral arteries: Patent. Middle cerebral arteries: Patent. Anterior communicating artery: Patent. Posterior communicating arteries: Possible diminutive right. No left identified, hypoplastic or  absent. Posterior cerebral arteries: Patent. Basilar artery: Patent. Vertebral arteries: Patent. No evidence of high-grade stenosis, large vessel occlusion, or aneurysm unless noted above. IMPRESSION: 1. Several small acute/early subacute infarctions within the right anterior paramedian superior pons, right posterior limb of internal capsule, right mid corona radiata, and left parietal operculum. Symptoms of left-sided facial droop and slurred speech are probably attributable to the infarction in right posterior limb of internal capsule. 2. Small late subacute to chronic infarcts within the left splenium of corpus callosum, left occipital lobe, and right inferior pons. 3. No acute intracranial hemorrhage. 4. Mild paranasal sinus disease. 5. No occlusion, aneurysm, dissection, or significant stenosis of the circle of Willis is identified. Electronically Signed   By: Kristine Garbe M.D.   On: 05/19/2016 21:46   Lower extremity venous duplex:  Negative Transcranial duplex:  Negative TEE:  The cavity size was normal. Wall thickness was   normal. Systolic function was normal. The estimated ejection   fraction was in the range of 60% to 65%. Wall motion was normal;   there were no regional wall motion abnormalities. Doppler  parameters are consistent with abnormal left ventricular   relaxation (grade 1 diastolic dysfunction).  Loop recorder 05/21/2016  Subjective: Speech is a little easier and facial droop is better.  Denies new weakness, numbness or confusion  Discharge Exam: Vitals:   05/22/16 1348 05/22/16 1728  BP: 138/66 (!) 146/75  Pulse: 60 (!) 56  Resp:  14  Temp: 98.6 F (37 C) 97.4 F (36.3 C)   Vitals:   05/22/16 0521 05/22/16 0859 05/22/16 1348 05/22/16 1728  BP: 137/75 (!) 150/82 138/66 (!) 146/75  Pulse: 62 60 60 (!) 56  Resp: 16 12  14   Temp: 98.7 F (37.1 C) 98.5 F (36.9 C) 98.6 F (37 C) 97.4 F (36.3 C)  TempSrc: Oral Oral Oral Oral  SpO2: 98% 100%  97%   Weight:      Height:        General exam:  Adult female.  No acute distress.  HEENT:  NCAT, MMM Respiratory system: Clear to auscultation bilaterally Cardiovascular system: Regular rate and rhythm, normal 99991111. 2/6 systolic murmur.  Warm extremities Gastrointestinal system: Normal active bowel sounds, soft, nondistended, nontender. MSK:  Normal tone and bulk, no lower extremity edema Neuro:  Mild left facial droop, strength 4/5 left upper and lower extremity and improved dysmetria of left upper and lower extremities.  Right extremities with 5/5 strength.  Sensation intact to light touch throughout.   The results of significant diagnostics from this hospitalization (including imaging, microbiology, ancillary and laboratory) are listed below for reference.     Microbiology: No results found for this or any previous visit (from the past 240 hour(s)).   Labs: BNP (last 3 results) No results for input(s): BNP in the last 8760 hours. Basic Metabolic Panel: No results for input(s): NA, K, CL, CO2, GLUCOSE, BUN, CREATININE, CALCIUM, MG, PHOS in the last 168 hours. Liver Function Tests: No results for input(s): AST, ALT, ALKPHOS, BILITOT, PROT, ALBUMIN in the last 168 hours. No results for input(s): LIPASE, AMYLASE in the last 168 hours. No results for input(s): AMMONIA in the last 168 hours. CBC: No results for input(s): WBC, NEUTROABS, HGB, HCT, MCV, PLT in the last 168 hours. Cardiac Enzymes: No results for input(s): CKTOTAL, CKMB, CKMBINDEX, TROPONINI in the last 168 hours. BNP: Invalid input(s): POCBNP CBG: No results for input(s): GLUCAP in the last 168 hours. D-Dimer No results for input(s): DDIMER in the last 72 hours. Hgb A1c No results for input(s): HGBA1C in the last 72 hours. Lipid Profile No results for input(s): CHOL, HDL, LDLCALC, TRIG, CHOLHDL, LDLDIRECT in the last 72 hours. Thyroid function studies No results for input(s): TSH, T4TOTAL, T3FREE, THYROIDAB in the  last 72 hours.  Invalid input(s): FREET3 Anemia work up No results for input(s): VITAMINB12, FOLATE, FERRITIN, TIBC, IRON, RETICCTPCT in the last 72 hours. Urinalysis    Component Value Date/Time   COLORURINE YELLOW 05/29/2016 1500   APPEARANCEUR CLEAR 05/29/2016 1500   LABSPEC 1.020 05/29/2016 1500   PHURINE 5.5 05/29/2016 1500   GLUCOSEU NEGATIVE 05/29/2016 1500   HGBUR NEGATIVE 05/29/2016 1500   BILIRUBINUR NEGATIVE 05/29/2016 1500   KETONESUR NEGATIVE 05/29/2016 1500   UROBILINOGEN 0.2 05/29/2016 1500   NITRITE NEGATIVE 05/29/2016 1500   LEUKOCYTESUR NEGATIVE 05/29/2016 1500   Sepsis Labs Invalid input(s): PROCALCITONIN,  WBC,  LACTICIDVEN   Time coordinating discharge: Over 30 minutes  SIGNED:   Janece Canterbury, MD  [facility] 06/16/2016, 1:29 PM Pager   If 7PM-7AM, please contact night-coverage [email or web address] Password [email or web address]

## 2016-05-21 NOTE — H&P (Signed)
Physical Medicine and Rehabilitation Admission H&P    Chief Complaint  Patient presents with  . Stroke Symptoms  : HPI: Elizabeth [name] is a 65 y.o. 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 Per chart review patient lives with spouse. Independent with a cane prior to admission. One level home with 3 steps to entry. Spouse works third shift. Reports there is a niece that can assist. Presented 05/19/2016 with left facial droop and slurred speech. By report patient has not seen her PCP since 2014 and ran out of her blood pressure medications 2 weeks prior to admission. CT/MRI showed several small acute early subacute infarcts within the right anterior paramedian superior pons, right posterior limb of internal capsule, right mid corona radiata and left parietal operculum. Small late subacute to chronic infarcts within the left splenium of corpus colostrum, left occipital lobe and right inferior pons. MRA of the head with no occlusion aneurysm or dissection without significant stenosis. CT angiogram of the neck no carotid bifurcation stenosis or dissection. Patient did not receive TPA. Underwent TEE 05/21/2016 showing normal LV systolic function. and placement of loop recorder 05/21/2016.. Currently maintained on aspirin for CVA prophylaxis. Subcutaneous Lovenox for DVT prophylaxis.Lower extremity Doppler studies negative for DVT. Tolerating a regular consistency diet. Physical and occupational therapy evaluation completed with recommendations of physical medicine rehabilitation consult.Patient was admitted for a comprehensive rehabilitation program  ROS Constitutional: Negative for chills and fever.  HENT: Negative for hearing loss.   Eyes: Positive for blurred vision. Negative for double vision.  Respiratory: Negative for cough and shortness of breath.   Cardiovascular: Negative for chest pain.    Gastrointestinal: Negative for nausea and vomiting.  Genitourinary: Negative for dysuria and hematuria.  Musculoskeletal: Positive for joint pain and myalgias.  Skin: Negative for rash.  Neurological: Positive for weakness. Negative for sensory change, focal weakness, seizures and headaches.  Psychiatric/Behavioral:       Anxiety  All other systems reviewed and are negative   Past Medical History:  Diagnosis Date  . Abrasion or friction burn of foot and toe(s), with infection   . Allergy    soy  . Anxiety   . 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 Degenerative joint disease    Generalized  . Hyperlipidemia   . Hypertension   . Myocardial infarction ([place])   . Sickle cell trait ([place])   . Tobacco abuse April, 2011   Continued   Past Surgical History:  Procedure Laterality Date  . ANGIOPLASTY  2006   x3 stents  . DILATION AND CURETTAGE OF UTERUS    . FOOT SURGERY  2000  . LITHOTRIPSY  2008   left kidney stone and also ureteral stent placed   Family History  Problem Relation Age of Onset  . Coronary artery disease Mother   . Coronary artery disease Father    Social History:  reports that she has been smoking Cigarettes.  She has never used smokeless tobacco. She reports that she does not drink alcohol or use drugs. Allergies:  Allergies  Allergen Reactions  . Shrimp [Shellfish Allergy] Nausea And Vomiting  . Adhesive [Tape] Other (See Comments)    Unknown  . Soy Allergy Nausea And Vomiting   Medications Prior to Admission  Medication Sig Dispense Refill  . aspirin  81 MG EC tablet Take 81 mg by mouth 2 (two) times daily.      Marland Kitchen atorvastatin (LIPITOR) 80 MG tablet TAKE 1 TABLET (80 MG TOTAL) BY MOUTH DAILY. (Patient not taking: Reported on 05/19/2016) 90 tablet 3  . enalapril (VASOTEC) 10 MG tablet Take 1 tablet (10 mg total) by mouth 2 (two) times daily. (Patient not taking:  Reported on 05/19/2016) 60 tablet 11  . escitalopram (LEXAPRO) 10 MG tablet Take 1 tablet (10 mg total) by mouth daily. (Patient not taking: Reported on 05/19/2016) 90 tablet 3  . ezetimibe (ZETIA) 10 MG tablet Take 1 tablet (10 mg total) by mouth daily. (Patient not taking: Reported on 05/19/2016) 90 tablet 3  . irbesartan (AVAPRO) 300 MG tablet Take 1 tablet (300 mg total) by mouth daily. (Patient not taking: Reported on 05/19/2016) 90 tablet 3  . metoprolol (LOPRESSOR) 50 MG tablet Take 1 tablet (50 mg total) by mouth 2 (two) times daily. (Patient not taking: Reported on 05/19/2016) 180 tablet 3  . nitroGLYCERIN (NITROLINGUAL) 0.4 MG/SPRAY spray Place 1 spray under the tongue every 5 (five) minutes as needed. May repeat up to 3 doses. 12 g 3    Home: Home Living Family/patient expects to be discharged to:: Private residence Living Arrangements: Spouse/significant other Available Help at Discharge: Family, Available PRN/intermittently (spouse works 3rd shift, reports niece can come stay with her) Type of Home: House Home Access: Stairs to enter Technical brewer of Steps: 3 Entrance Stairs-Rails: Right [place]: One level Bathroom Shower/Tub: Tub/shower unit, Walk-in shower Home Equipment: Radio producer - single point Additional Comments: watches 2 yo granddaughter  Lives With: Spouse   Functional History: Prior Function Level of Independence: Independent Comments: used cane occasionally when L knee would hurt, watches kids  Functional Status:  Mobility: Bed Mobility Overal bed mobility: Modified Independent General bed mobility comments: used bed rail Transfers Overall transfer level: Needs assistance Equipment used: None Transfers: Sit to/from Stand Sit to Stand: Min guard General transfer comment: pt held onto rail to steady self, min guard to steady pt Ambulation/Gait Ambulation/Gait assistance: Min assist Ambulation Distance (Feet): 150 Feet Assistive device: None Gait  Pattern/deviations: Step-through pattern, Decreased stride length, Staggering left General Gait Details: pt consistently running into objects on the L without recognition or problem solving around the obstacle Gait velocity: guarded Gait velocity interpretation: Below normal speed for age/gender Stairs: Yes Stairs assistance: Min assist Stair Management: One rail Right Number of Stairs: 3 (x2) General stair comments: pt leaning to the L, minA to maintain balance without reaching for L rail (only used R HR to mimic home set up)    ADL: ADL Overall ADL's : Needs assistance/impaired Eating/Feeding: Set up, Sitting Eating/Feeding Details (indicate cue type and reason): cues for L side  Grooming: Wash/dry face, Oral care, Set up Toilet Transfer: Min Psychiatric nurse Details (indicate cue type and reason): pt reaching for environmental supports on L side. pt with limp to gait pattern with transfers General ADL Comments: Pt on arrival reports "there is a black spot in my vision over here" pt reports "its like a dirt I can see it but its like its dirty" pt with testing has greater deficits with white surfaces. Contrast on cell phone turned to more of a grey color to help with reading cell phone  Cognition: Cognition Overall Cognitive Status: Impaired/Different from baseline Arousal/Alertness: Awake/alert Orientation Level: Oriented X4 Attention: Sustained Sustained Attention: Impaired Sustained Attention Impairment: Verbal basic Memory: Impaired Memory Impairment: Retrieval deficit, Decreased  recall of new information Awareness: Impaired Awareness Impairment: Emergent impairment, Anticipatory impairment Problem Solving: Impaired Problem Solving Impairment: Verbal basic Safety/Judgment: Impaired Cognition Arousal/Alertness: Awake/alert Behavior During Therapy: WFL for tasks assessed/performed Overall Cognitive Status: Impaired/Different from baseline Area of Impairment:  Safety/judgement Safety/Judgement: Decreased awareness of deficits, Decreased awareness of safety General Comments: pt with L sided neglect  Physical Exam: Blood pressure (!) 145/85, pulse 65, temperature 98.3 F (36.8 C), temperature source Oral, resp. rate 16, height '5\' 3"'  (1.6 m), weight 61.2 kg (134 lb 14.7 oz), SpO2 97 %. Physical Exam Constitutional: She is oriented to person, place, and time. She appears well-developed and well-nourished.  HENT:  Head: Normocephalic and atraumatic.  Eyes: EOM are normal.  Pupils reactive to light  Neck: Normal range of motion. Neck supple. No thyromegaly present.  Cardiovascular: Normal rate and regular rhythm.   Respiratory: Effort normal and breath sounds normal. No respiratory distress.  GI: Soft. Bowel sounds are normal. She exhibits no distension.  Musculoskeletal: She exhibits no edema or tenderness.  Neurological: She is alert and oriented to person, place, and time.  Follows three step commands.  Speech is dysarthric but intelligible Sensation intact to light touch DTRs brisk b/l LE Motor: 4+-5/5 throughout No ataxia, dysmetria.  Left facial weakness Skin: Skin is warm and dry.  Psychiatric: She has a normal mood and affect. Her behavior is normal   Results for orders placed or performed during the hospital encounter of 05/19/16 (from the past 48 hour(s))  Protime-INR     Status: None   Collection Time: 05/19/16  2:44 PM  Result Value Ref Range   Prothrombin Time 12.6 11.4 - 15.2 seconds   INR 0.95   APTT     Status: None   Collection Time: 05/19/16  2:44 PM  Result Value Ref Range   aPTT 29 24 - 36 seconds  CBC     Status: None   Collection Time: 05/19/16  2:44 PM  Result Value Ref Range   WBC 9.3 4.0 - 10.5 K/uL   RBC 4.93 3.87 - 5.11 MIL/uL   Hemoglobin 14.6 12.0 - 15.0 g/dL   HCT 42.6 36.0 - 46.0 %   MCV 86.4 78.0 - 100.0 fL   MCH 29.6 26.0 - 34.0 pg   MCHC 34.3 30.0 - 36.0 g/dL   RDW 14.5 11.5 - 15.5 %    Platelets 157 150 - 400 K/uL  Differential     Status: None   Collection Time: 05/19/16  2:44 PM  Result Value Ref Range   Neutrophils Relative % 52 %   Neutro Abs 4.8 1.7 - 7.7 K/uL   Lymphocytes Relative 39 %   Lymphs Abs 3.6 0.7 - 4.0 K/uL   Monocytes Relative 7 %   Monocytes Absolute 0.7 0.1 - 1.0 K/uL   Eosinophils Relative 1 %   Eosinophils Absolute 0.1 0.0 - 0.7 K/uL   Basophils Relative 1 %   Basophils Absolute 0.1 0.0 - 0.1 K/uL  Comprehensive metabolic panel     Status: Abnormal   Collection Time: 05/19/16  2:44 PM  Result Value Ref Range   Sodium 136 135 - 145 mmol/L   Potassium 3.9 3.5 - 5.1 mmol/L   Chloride 108 101 - 111 mmol/L   CO2 25 22 - 32 mmol/L   Glucose, Bld 111 (H) 65 - 99 mg/dL   BUN 9 6 - 20 mg/dL   Creatinine, Ser 0.83 0.44 - 1.00 mg/dL   Calcium 10.0 8.9 - 10.3  mg/dL   Total Protein 6.6 6.5 - 8.1 g/dL   Albumin 3.7 3.5 - 5.0 g/dL   AST 18 15 - 41 U/L   ALT 11 (L) 14 - 54 U/L   Alkaline Phosphatase 101 38 - 126 U/L   Total Bilirubin 0.5 0.3 - 1.2 mg/dL   GFR calc non Af Amer >60 >60 mL/min   GFR calc Af Amer >60 >60 mL/min    Comment: (NOTE) The eGFR has been calculated using the CKD EPI equation. This calculation has not been validated in all clinical situations. eGFR's persistently <60 mL/min signify possible Chronic Kidney Disease.    Anion gap 3 (L) 5 - 15  I-stat troponin, ED     Status: None   Collection Time: 05/19/16  4:07 PM  Result Value Ref Range   Troponin i, poc 0.01 0.00 - 0.08 ng/mL   Comment 3            Comment: Due to the release kinetics of cTnI, a negative result within the first hours of the onset of symptoms does not rule out myocardial infarction with certainty. If myocardial infarction is still suspected, repeat the test at appropriate intervals.   I-Stat Chem 8, ED     Status: Abnormal   Collection Time: 05/19/16  4:10 PM  Result Value Ref Range   Sodium 139 135 - 145 mmol/L   Potassium 4.1 3.5 - 5.1 mmol/L    Chloride 103 101 - 111 mmol/L   BUN 9 6 - 20 mg/dL   Creatinine, Ser 0.70 0.44 - 1.00 mg/dL   Glucose, Bld 93 65 - 99 mg/dL   Calcium, Ion 1.26 (H) 1.12 - 1.23 mmol/L   TCO2 27 0 - 100 mmol/L   Hemoglobin 16.0 (H) 12.0 - 15.0 g/dL   HCT 47.0 (H) 36.0 - 46.0 %  Hemoglobin A1c     Status: None   Collection Time: 05/20/16  4:17 AM  Result Value Ref Range   Hgb A1c MFr Bld 5.5 4.8 - 5.6 %    Comment: (NOTE)         Pre-diabetes: 5.7 - 6.4         Diabetes: >6.4         Glycemic control for adults with diabetes: <7.0    Mean Plasma Glucose 111 mg/dL    Comment: (NOTE) Performed At: [facility] [place], [place] [id number] Lindon Romp MD IO:[id number]   Lipid panel     Status: Abnormal   Collection Time: 05/20/16  4:17 AM  Result Value Ref Range   Cholesterol 248 (H) 0 - 200 mg/dL   Triglycerides 145 <150 mg/dL   HDL 54 >40 mg/dL   Total CHOL/HDL Ratio 4.6 RATIO   VLDL 29 0 - 40 mg/dL   LDL Cholesterol 165 (H) 0 - 99 mg/dL    Comment:        Total Cholesterol/HDL:CHD Risk Coronary Heart Disease Risk Table                     Men   Women  1/2 Average Risk   3.4   3.3  Average Risk       5.0   4.4  2 X Average Risk   9.6   7.1  3 X Average Risk  23.4   11.0        Use the calculated Patient Ratio above and the CHD Risk Table to determine the patient's CHD Risk.  ATP III CLASSIFICATION (LDL):  <100     mg/dL   Optimal  100-129  mg/dL   Near or Above                    Optimal  130-159  mg/dL   Borderline  160-189  mg/dL   High  >190     mg/dL   Very High   Urine rapid drug screen (hosp performed)     Status: None   Collection Time: 05/20/16  8:18 AM  Result Value Ref Range   Opiates NONE DETECTED NONE DETECTED   Cocaine NONE DETECTED NONE DETECTED   Benzodiazepines NONE DETECTED NONE DETECTED   Amphetamines NONE DETECTED NONE DETECTED   Tetrahydrocannabinol NONE DETECTED NONE DETECTED   Barbiturates NONE DETECTED NONE DETECTED     Comment:        DRUG SCREEN FOR MEDICAL PURPOSES ONLY.  IF CONFIRMATION IS NEEDED FOR ANY PURPOSE, NOTIFY LAB WITHIN 5 DAYS.        LOWEST DETECTABLE LIMITS FOR URINE DRUG SCREEN Drug Class       Cutoff (ng/mL) Amphetamine      1000 Barbiturate      200 Benzodiazepine   696 Tricyclics       789 Opiates          300 Cocaine          300 THC              50    Ct Head Wo Contrast  Result Date: 05/19/2016 CLINICAL DATA:  Left-sided facial droop and weakness. Slight slurred speech. EXAM: CT HEAD WITHOUT CONTRAST TECHNIQUE: Contiguous axial images were obtained from the base of the skull through the vertex without intravenous contrast. COMPARISON:  None. FINDINGS: Brain: No evidence of acute hemorrhage, hydrocephalus, extra-axial collection or mass lesion/mass effect. There is a small area of hypoattenuation in the deep white matter of the right frontal lobe, bordering the right lateral ventricle, likely representing an age-indeterminate ischemic infarct. Vascular: No hyperdense vessel or unexpected calcification. Skull: No acute fractures. Sinuses/Orbits: No acute finding. Other: None. IMPRESSION: Small area of hypoattenuation in the deep white matter of the right frontal lobe, which likely represents an age-indeterminate ischemic infarct. Alternatively this may represent an area of demyelination. Mild brain parenchymal atrophy. These results were called by telephone at the time of interpretation on 05/19/2016 at 3:46 pm to Dr. Audie Pinto, who verbally acknowledged these results. Electronically Signed   By: Fidela Salisbury M.D.   On: 05/19/2016 15:51   Ct Angio Neck W Or Wo Contrast  Result Date: 05/20/2016 CLINICAL DATA:  Multiple acute infarcts, with symptoms of LEFT facial droop and slurred speech new onset 05/19/2016. EXAM: CT ANGIOGRAPHY NECK TECHNIQUE: Multidetector CT imaging of the neck was performed using the standard protocol during bolus administration of intravenous contrast.  Multiplanar CT image reconstructions and MIPs were obtained to evaluate the vascular anatomy. Carotid stenosis measurements (when applicable) are obtained utilizing NASCET criteria, using the distal internal carotid diameter as the denominator. CONTRAST:  Isovue 370, 50 mL. COMPARISON:  MRI brain and MRA intracranial 05/19/2016. FINDINGS: Aortic arch: Standard branching. Imaged portion shows no evidence of aneurysm or dissection. No significant stenosis of the major arch vessel origins. Mild non stenotic calcific plaque at the origin of the innominate, and LEFT common carotid arteries. Calcific and noncalcific plaque at the origin of LEFT subclavian, estimated 50% stenosis. Right carotid system: Minor calcific plaque. Dolichoectatic cervical ICA. No evidence of dissection,  stenosis (50% or greater) or occlusion. Left carotid system: Incidental nonocclusive and non stenotic web, LEFT ICA origin No evidence of dissection, stenosis (50% or greater) or occlusion. Vertebral arteries: Codominant. No evidence of dissection, stenosis (50% or greater) or occlusion. Skeleton: Cervical spondylosis with significant disc space narrowing at C5-6 and C6-7. The patient is edentulous. Other neck: No neck masses. Upper chest: No lung apex lesion.  Minor changes of COPD. IMPRESSION: No carotid bifurcation stenosis or dissection. Minor atheromatous change at the great vessel origins of the innominate and LEFT common carotid artery, with calcific and noncalcific plaque resulting in 50% stenosis LEFT subclavian origin. Electronically Signed   By: Staci Righter M.D.   On: 05/20/2016 09:53   Mr Brain Wo Contrast  Result Date: 05/19/2016 CLINICAL DATA:  65 y/o  F; left facial droop and slurred speech. EXAM: MRI HEAD WITHOUT CONTRAST MRA HEAD WITHOUT CONTRAST TECHNIQUE: Multiplanar, multiecho pulse sequences of the brain and surrounding structures were obtained without intravenous contrast. Angiographic images of the head were obtained  using MRA technique without contrast. COMPARISON:  05/19/2016 CT head. FINDINGS: MRI HEAD FINDINGS Brain: There are small foci of diffusion restriction within the right anterior paramedian superior pons, the right posterior limb of internal capsule, the right mid corona radiata, and the left parietal operculum consistent with acute infarction. There additional foci of diffusion hyperintensity without low ADC in the left splenium of corpus callosum and left occipital lobe which may represent late subacute/chronic area of ischemia. Chronic lacunar infarct within the right inferior pons and probable small old infarct in the right posterior medial frontal lobe. No abnormal susceptibility hypointensity to indicate intracranial hemorrhage. Extra-axial space: No hydrocephalus. No extra-axial collection is identified. Cavum septum pellucidum. Other: Mild maxillary sinus mucosal thickening. No abnormal signal of the mastoid air cells. Orbits are unremarkable. Calvarium is unremarkable. 13 mm Thornwaldt cyst. MRA HEAD FINDINGS Internal carotid arteries: Patent. Anterior cerebral arteries: Patent. Middle cerebral arteries: Patent. Anterior communicating artery: Patent. Posterior communicating arteries: Possible diminutive right. No left identified, hypoplastic or absent. Posterior cerebral arteries: Patent. Basilar artery: Patent. Vertebral arteries: Patent. No evidence of high-grade stenosis, large vessel occlusion, or aneurysm unless noted above. IMPRESSION: 1. Several small acute/early subacute infarctions within the right anterior paramedian superior pons, right posterior limb of internal capsule, right mid corona radiata, and left parietal operculum. Symptoms of left-sided facial droop and slurred speech are probably attributable to the infarction in right posterior limb of internal capsule. 2. Small late subacute to chronic infarcts within the left splenium of corpus callosum, left occipital lobe, and right inferior  pons. 3. No acute intracranial hemorrhage. 4. Mild paranasal sinus disease. 5. No occlusion, aneurysm, dissection, or significant stenosis of the circle of Willis is identified. Electronically Signed   By: Kristine Garbe M.D.   On: 05/19/2016 21:46   Mr Jodene Nam Head/brain IH Cm  Result Date: 05/19/2016 CLINICAL DATA:  65 y/o  F; left facial droop and slurred speech. EXAM: MRI HEAD WITHOUT CONTRAST MRA HEAD WITHOUT CONTRAST TECHNIQUE: Multiplanar, multiecho pulse sequences of the brain and surrounding structures were obtained without intravenous contrast. Angiographic images of the head were obtained using MRA technique without contrast. COMPARISON:  05/19/2016 CT head. FINDINGS: MRI HEAD FINDINGS Brain: There are small foci of diffusion restriction within the right anterior paramedian superior pons, the right posterior limb of internal capsule, the right mid corona radiata, and the left parietal operculum consistent with acute infarction. There additional foci of diffusion hyperintensity without low ADC in  the left splenium of corpus callosum and left occipital lobe which may represent late subacute/chronic area of ischemia. Chronic lacunar infarct within the right inferior pons and probable small old infarct in the right posterior medial frontal lobe. No abnormal susceptibility hypointensity to indicate intracranial hemorrhage. Extra-axial space: No hydrocephalus. No extra-axial collection is identified. Cavum septum pellucidum. Other: Mild maxillary sinus mucosal thickening. No abnormal signal of the mastoid air cells. Orbits are unremarkable. Calvarium is unremarkable. 13 mm Thornwaldt cyst. MRA HEAD FINDINGS Internal carotid arteries: Patent. Anterior cerebral arteries: Patent. Middle cerebral arteries: Patent. Anterior communicating artery: Patent. Posterior communicating arteries: Possible diminutive right. No left identified, hypoplastic or absent. Posterior cerebral arteries: Patent. Basilar  artery: Patent. Vertebral arteries: Patent. No evidence of high-grade stenosis, large vessel occlusion, or aneurysm unless noted above. IMPRESSION: 1. Several small acute/early subacute infarctions within the right anterior paramedian superior pons, right posterior limb of internal capsule, right mid corona radiata, and left parietal operculum. Symptoms of left-sided facial droop and slurred speech are probably attributable to the infarction in right posterior limb of internal capsule. 2. Small late subacute to chronic infarcts within the left splenium of corpus callosum, left occipital lobe, and right inferior pons. 3. No acute intracranial hemorrhage. 4. Mild paranasal sinus disease. 5. No occlusion, aneurysm, dissection, or significant stenosis of the circle of Willis is identified. Electronically Signed   By: Kristine Garbe M.D.   On: 05/19/2016 21:46    Medical Problem List and Plan: 1.  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 Status post loop recorder 2.  DVT Prophylaxis/Anticoagulation: Subcutaneous Lovenox. Monitor platelet counts and any signs of bleeding 3. Pain Management: Tylenol as needed 4. Mood: Lexapro 10 mg daily 5. Neuropsych: This patient is capable of making decisions on her own behalf. 6. Skin/Wound Care: Routine skin checks 7. Fluids/Electrolytes/Nutrition: Routine I&O with follow-up chemistries 8. Hypertension. Norvasc 5 mg daily,. Monitor with increased mobility 9. Hyperlipidemia. Zetia 10 mg daily, Lipitor 80 mg daily 10. CAD with history of MI. Continue aspirin. 11. Tobacco abuse. Counseling 12. Sickle cell trait: Monitor for crisis  Post Admission Physician Evaluation: 1. Functional deficits secondary  to acute/early subacute infarct right anterior paramedian superior pons, posterior limb, right mid corona radiata  and left parietal operculum. 2. Patient is admitted to receive collaborative, interdisciplinary care between the physiatrist, rehab nursing staff, and therapy team. 3. Patient's level of medical complexity and substantial therapy needs in context of that medical necessity cannot be provided at a lesser intensity of care such as a SNF. 4. Patient has experienced substantial functional loss from his/her baseline which was documented above under the "Functional History" and "Functional Status" headings.  Judging by the patient's diagnosis, physical exam, and functional history, the patient has potential for functional progress which will result in measurable gains while on inpatient rehab.  These gains will be of substantial and practical use upon discharge  in facilitating mobility and self-care at the household level. 5. Physiatrist will provide 24 hour management of medical needs as well as oversight of the therapy plan/treatment and provide guidance as appropriate regarding the interaction of the two. 6. 24 hour rehab nursing will assist with  safety, disease management and patient education and help integrate therapy concepts, techniques,education, etc. 7. PT will assess and treat for/with: Lower extremity strength, range of motion, stamina, balance, functional mobility, safety, adaptive techniques and equipment, woundcare,  coping skills, pain control, education.   Goals are: Mod I. 8. OT will assess and treat for/with: ADL's, functional mobility, safety, upper extremity strength, adaptive techniques and equipment, wound mgt, ego support, and community reintegration.   Goals are: Mod I. Therapy may proceed with showering this patient. 9. Case Management and Social Worker will assess and treat for psychological issues and discharge planning. 10. Team conference will be held weekly to assess progress toward goals and to determine barriers to discharge. 11. Patient will receive at least 3 hours of therapy  per day at least 5 days per week. 12. ELOS: 7-10 days.       13. Prognosis:  good  Delice Lesch, MD 05/21/2016

## 2016-05-21 NOTE — Progress Notes (Signed)
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 MRI revealed severl small acute infarct in anterior paramedian superior pons, R posterior limb of internal capsule, R mid corona radiata, and L parietal operculum.    PT Comments    Patient is making progress toward mobility goals however continues to demo R side bias, L side visual deficit, and balance deficits. Continue to recommend CIR for further skilled PT services to reach Mod I level before d/c home.   Follow Up Recommendations  CIR     Equipment Recommendations  None recommended by PT (TBD by next venue)    Recommendations for Other Services Rehab consult     Precautions / Restrictions Precautions Precautions: Fall Restrictions Weight Bearing Restrictions: No    Mobility  Bed Mobility               General bed mobility comments: OOB in chair upon arrival  Transfers Overall transfer level: Needs assistance Equipment used: None Transfers: Sit to/from Stand Sit to Stand: Min guard         General transfer comment: min guard for safety  Ambulation/Gait Ambulation/Gait assistance: Min guard;Min assist Ambulation Distance (Feet): 150 Feet Assistive device: None Gait Pattern/deviations: Step-through pattern;Decreased stride length Gait velocity: decreased   General Gait Details: pt with improved awareness of L side however does demo some L side neglect when asked to look toward L side; LOB with horizontal head turns toward L side with min A required to recover balance   Stairs   Stairs assistance: Min guard Stair Management: One rail Right Number of Stairs:  3 General stair comments: cues for sequencing and safety awareness; min guard for safety  Wheelchair Mobility    Modified Rankin (Stroke Patients Only) Modified Rankin (Stroke Patients Only) Pre-Morbid Rankin Score: No symptoms Modified Rankin: Moderate disability     Balance                                    Cognition Arousal/Alertness: Awake/alert Behavior During Therapy: WFL for tasks assessed/performed Overall Cognitive Status: Impaired/Different from baseline Area of Impairment: Safety/judgement         Safety/Judgement: Decreased awareness of deficits;Decreased awareness of safety          Exercises      General Comments General comments (skin integrity, edema, etc.): pt reported that she sees "less of a shadow" today and believes her vision is improving; noted R side bias      Pertinent Vitals/Pain Pain Assessment: No/denies pain    Home Living                      Prior Function            PT Goals (current goals can now be found in the care plan section) Acute Rehab PT Goals Patient Stated Goal: go home PT Goal Formulation: With patient Time For Goal Achievement: 05/27/16 Potential to Achieve Goals: Good Progress towards PT goals: Progressing toward goals    Frequency  Min 4X/week    PT Plan Current plan remains appropriate    Co-evaluation  End of Session Equipment Utilized During Treatment: Gait belt Activity Tolerance: Patient tolerated treatment well Patient left: with call bell/phone within reach;in chair;with chair alarm set     Time: 0912-0931 PT Time Calculation (min) (ACUTE ONLY): 19 min  Charges:  $Gait Training: 8-22 mins                    G Codes:     Salina April, PTA Pager: [phone]   05/21/2016, 11:30 AM

## 2016-05-21 NOTE — Progress Notes (Signed)
  Echocardiogram 2D Echocardiogram has been performed.  Elizabeth [name] 05/21/2016, 11:51 AM

## 2016-05-21 NOTE — Progress Notes (Signed)
Pt. Remains off the floor for procedures.  I have not heard back from pt's [facility] regarding an IP rehab admission.  Will follow along and will provide an update when I hear from [facility].  Please call if questions.  [place] Admissions Coordinator Cell [phone] Office [phone]

## 2016-05-21 NOTE — Progress Notes (Signed)
OT Cancellation Note  Patient Details Name: Jonet Hausler MRN: [id number] DOB: 05-30-1951   Cancelled Treatment:    Reason Eval/Treat Not Completed: Patient at procedure or test/ unavailable (ENDO)  Vonita Moss   OTR/L Pager: [phone] Office: [phone] .  05/21/2016, 2:22 PM

## 2016-05-21 NOTE — Progress Notes (Signed)
Consent obtained and put in the chart. NP at the bedside. Patient verbalized understanding with no questions. Wanted to wait until after procedure to take medicines.

## 2016-05-21 NOTE — Progress Notes (Signed)
Pt returned from procedure. Vital signs stable, alert and oriented X4, denies any pain.

## 2016-05-22 ENCOUNTER — Inpatient Hospital Stay (HOSPITAL_COMMUNITY)
Admission: RE | Admit: 2016-05-22 | Discharge: 2016-05-27 | DRG: 057 | Disposition: A | Discharge status: Home / Self Care | Payer: 59 | Source: Intra-hospital | Attending: Physical Medicine & Rehabilitation | Admitting: Physical Medicine & Rehabilitation

## 2016-05-22 ENCOUNTER — Encounter (HOSPITAL_COMMUNITY): Payer: Self-pay | Admitting: Internal Medicine

## 2016-05-22 DIAGNOSIS — Z955 Presence of coronary angioplasty implant and graft: Secondary | ICD-10-CM

## 2016-05-22 DIAGNOSIS — I252 Old myocardial infarction: Secondary | ICD-10-CM

## 2016-05-22 DIAGNOSIS — Z91048 Other nonmedicinal substance allergy status: Secondary | ICD-10-CM

## 2016-05-22 DIAGNOSIS — E785 Hyperlipidemia, unspecified: Secondary | ICD-10-CM | POA: Diagnosis present

## 2016-05-22 DIAGNOSIS — F4322 Adjustment disorder with anxiety: Secondary | ICD-10-CM

## 2016-05-22 DIAGNOSIS — I69398 Other sequelae of cerebral infarction: Secondary | ICD-10-CM | POA: Diagnosis not present

## 2016-05-22 DIAGNOSIS — R471 Dysarthria and anarthria: Secondary | ICD-10-CM | POA: Diagnosis present

## 2016-05-22 DIAGNOSIS — Z91013 Allergy to seafood: Secondary | ICD-10-CM | POA: Diagnosis not present

## 2016-05-22 DIAGNOSIS — R29810 Facial weakness: Secondary | ICD-10-CM

## 2016-05-22 DIAGNOSIS — Z95818 Presence of other cardiac implants and grafts: Secondary | ICD-10-CM | POA: Diagnosis not present

## 2016-05-22 DIAGNOSIS — I10 Essential (primary) hypertension: Secondary | ICD-10-CM | POA: Diagnosis present

## 2016-05-22 DIAGNOSIS — Z79899 Other long term (current) drug therapy: Secondary | ICD-10-CM

## 2016-05-22 DIAGNOSIS — I2510 Atherosclerotic heart disease of native coronary artery without angina pectoris: Secondary | ICD-10-CM | POA: Diagnosis present

## 2016-05-22 DIAGNOSIS — F17210 Nicotine dependence, cigarettes, uncomplicated: Secondary | ICD-10-CM | POA: Diagnosis present

## 2016-05-22 DIAGNOSIS — I6350 Cerebral infarction due to unspecified occlusion or stenosis of unspecified cerebral artery: Secondary | ICD-10-CM | POA: Diagnosis not present

## 2016-05-22 DIAGNOSIS — F329 Major depressive disorder, single episode, unspecified: Secondary | ICD-10-CM | POA: Diagnosis present

## 2016-05-22 DIAGNOSIS — Z7982 Long term (current) use of aspirin: Secondary | ICD-10-CM

## 2016-05-22 DIAGNOSIS — G8194 Hemiplegia, unspecified affecting left nondominant side: Secondary | ICD-10-CM | POA: Diagnosis not present

## 2016-05-22 DIAGNOSIS — Z91018 Allergy to other foods: Secondary | ICD-10-CM | POA: Diagnosis not present

## 2016-05-22 DIAGNOSIS — R269 Unspecified abnormalities of gait and mobility: Secondary | ICD-10-CM | POA: Diagnosis not present

## 2016-05-22 DIAGNOSIS — I69352 Hemiplegia and hemiparesis following cerebral infarction affecting left dominant side: Principal | ICD-10-CM

## 2016-05-22 DIAGNOSIS — D573 Sickle-cell trait: Secondary | ICD-10-CM | POA: Diagnosis present

## 2016-05-22 LAB — CBC
HCT: 40.7 % (ref 36.0–46.0)
Hemoglobin: 13.6 g/dL (ref 12.0–15.0)
MCH: 28.9 pg (ref 26.0–34.0)
MCHC: 33.4 g/dL (ref 30.0–36.0)
MCV: 86.4 fL (ref 78.0–100.0)
PLATELETS: 145 10*3/uL — AB (ref 150–400)
RBC: 4.71 MIL/uL (ref 3.87–5.11)
RDW: 14 % (ref 11.5–15.5)
WBC: 7.1 10*3/uL (ref 4.0–10.5)

## 2016-05-22 LAB — CREATININE, SERUM
Creatinine, Ser: 0.68 mg/dL (ref 0.44–1.00)
GFR calc Af Amer: >60 mL/min (ref >60)
GFR calc non Af Amer: >60 mL/min (ref >60)

## 2016-05-22 MED ORDER — ENOXAPARIN SODIUM 40 MG/0.4ML ~~LOC~~ SOLN: 40.0000 mg | SUBCUTANEOUS | Status: DC

## 2016-05-22 MED ORDER — EZETIMIBE 10 MG PO TABS
10.0000 mg | ORAL_TABLET | Freq: Every day | ORAL | Status: DC
Start: 2016-05-23 — End: 2016-05-27
  Administered 2016-05-23 – 2016-05-27 (×5): 10 mg via ORAL
  Filled 2016-05-22 (×5): qty 1

## 2016-05-22 MED ORDER — ENOXAPARIN SODIUM 40 MG/0.4ML ~~LOC~~ SOLN
40.0000 mg | SUBCUTANEOUS | Status: DC
  Administered 2016-05-22 – 2016-05-26 (×5): 40 mg via SUBCUTANEOUS
  Filled 2016-05-22 (×5): qty 0.4

## 2016-05-22 MED ORDER — ATORVASTATIN CALCIUM 80 MG PO TABS
80.0000 mg | ORAL_TABLET | Freq: Every day | ORAL | Status: DC
  Administered 2016-05-23 – 2016-05-26 (×4): 80 mg via ORAL
  Filled 2016-05-22 (×4): qty 1

## 2016-05-22 MED ORDER — IRBESARTAN 300 MG PO TABS
300.0000 mg | ORAL_TABLET | Freq: Every day | ORAL | Status: DC
  Administered 2016-05-23 – 2016-05-27 (×5): 300 mg via ORAL
  Filled 2016-05-22 (×5): qty 1

## 2016-05-22 MED ORDER — ONDANSETRON HCL 4 MG/2ML IJ SOLN: 4.0000 mg | Freq: Four times a day (QID) | INTRAMUSCULAR | Status: DC | PRN

## 2016-05-22 MED ORDER — SORBITOL 70 % SOLN: 30.0000 mL | Freq: Every day | Status: DC | PRN

## 2016-05-22 MED ORDER — AMLODIPINE BESYLATE 5 MG PO TABS
5.0000 mg | ORAL_TABLET | Freq: Every day | ORAL | Status: DC
  Administered 2016-05-23 – 2016-05-27 (×5): 5 mg via ORAL
  Filled 2016-05-22 (×5): qty 1

## 2016-05-22 MED ORDER — ASPIRIN 300 MG RE SUPP: 300.0000 mg | Freq: Every day | RECTAL | Status: DC

## 2016-05-22 MED ORDER — METOPROLOL TARTRATE 50 MG PO TABS
50.0000 mg | ORAL_TABLET | Freq: Two times a day (BID) | ORAL | Status: DC
  Administered 2016-05-22 – 2016-05-27 (×8): 50 mg via ORAL
  Filled 2016-05-22 (×9): qty 1

## 2016-05-22 MED ORDER — ACETAMINOPHEN 325 MG PO TABS: 325.0000 mg | ORAL_TABLET | ORAL | Status: DC | PRN

## 2016-05-22 MED ORDER — ESCITALOPRAM OXALATE 10 MG PO TABS
10.0000 mg | ORAL_TABLET | Freq: Every day | ORAL | Status: DC
  Administered 2016-05-23 – 2016-05-27 (×4): 10 mg via ORAL
  Filled 2016-05-22 (×5): qty 1

## 2016-05-22 MED ORDER — ASPIRIN 325 MG PO TABS
325.0000 mg | ORAL_TABLET | Freq: Every day | ORAL | Status: DC
  Administered 2016-05-23 – 2016-05-27 (×5): 325 mg via ORAL
  Filled 2016-05-22 (×5): qty 1

## 2016-05-22 MED ORDER — ONDANSETRON HCL 4 MG PO TABS: 4.0000 mg | ORAL_TABLET | Freq: Four times a day (QID) | ORAL | Status: DC | PRN

## 2016-05-22 MED ORDER — SENNOSIDES-DOCUSATE SODIUM 8.6-50 MG PO TABS: 1.0000 | ORAL_TABLET | Freq: Every evening | ORAL | Status: DC | PRN

## 2016-05-22 NOTE — Progress Notes (Signed)
Ankit Lorie Phenix, MD Physician Signed Physical Medicine and Rehabilitation  Consult Note Date of Service: 05/20/2016 12:45 PM  Related encounter: ED to Hosp-Admission (Current) from 05/19/2016 in [facility] All Collapse All   [] Hide copied text [] Hover for attribution information      Physical Medicine and Rehabilitation Consult Reason for Consult: Acute/early subacute infarct right anterior paramedian superior pons, posterior limb, right mid corona radiata and left parietal operculum Referring Physician: [facility]   HPI: Elizabeth [name] is a 65 y.o. 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 Per chart review patient lives with spouse. Independent with a cane prior to admission. One level home with 3 steps to entry. Spouse works third shift. Reports there is a niece that can assist. Presented 05/19/2016 with left facial droop and slurred speech. By report patient has not seen her PCP since 2014 and ran out of her blood pressure medications 2 weeks prior to admission. CT/MRI showed several small acute early subacute infarcts within the right anterior paramedian superior pons, right posterior limb of internal capsule, right mid corona radiata and left parietal operculum. Small late subacute to chronic infarcts within the left splenium of corpus colostrum, left occipital lobe and right inferior pons. MRA of the head with no occlusion aneurysm or dissection without significant stenosis. CT angiogram of the neck no carotid bifurcation stenosis or dissection. Patient did not receive TPA. Echocardiogram is pending. Currently maintained on aspirin for CVA prophylaxis. Subcutaneous Lovenox for DVT prophylaxis. Tolerating a regular consistency diet. Physical and occupational therapy evaluation completed with recommendations of physical medicine rehabilitation  consult.   Review of Systems  Constitutional: Negative for chills and fever.  HENT: Negative for hearing loss.   Eyes: Positive for blurred vision. Negative for double vision.  Respiratory: Negative for cough and shortness of breath.   Cardiovascular: Positive for palpitations and leg swelling. Negative for chest pain.  Gastrointestinal: Positive for blood in stool. Negative for nausea and vomiting.  Genitourinary: Negative for dysuria and hematuria.  Musculoskeletal: Positive for joint pain and myalgias.  Skin: Negative for rash.  Neurological: Positive for weakness. Negative for sensory change, focal weakness, seizures and headaches.  Psychiatric/Behavioral:       Anxiety  All other systems reviewed and are negative.      Past Medical History:  Diagnosis Date  . Abrasion or friction burn of foot and toe(s), with infection   . Allergy    soy  . Anxiety   . 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 Degenerative joint disease    Generalized  . Hyperlipidemia   . Hypertension   . Myocardial infarction ([place])   . Sickle cell trait ([place])   . Tobacco abuse April, 2011   Continued        Past Surgical History:  Procedure Laterality Date  . ANGIOPLASTY  2006   x3 stents  . DILATION AND CURETTAGE OF UTERUS    . FOOT SURGERY  2000  . LITHOTRIPSY  2008   left kidney stone and also ureteral stent placed        Family History  Problem Relation Age of Onset  . Coronary artery disease Mother   . Coronary artery disease Father    Social History:  reports that  she has been smoking Cigarettes.  She has never used smokeless tobacco. She reports that she does not drink alcohol or use drugs. Allergies:       Allergies  Allergen Reactions  . Shrimp [Shellfish Allergy] Nausea And Vomiting  . Adhesive [Tape] Other (See Comments)    Unknown  . Soy Allergy Nausea  And Vomiting         Medications Prior to Admission  Medication Sig Dispense Refill  . aspirin 81 MG EC tablet Take 81 mg by mouth 2 (two) times daily.      Marland Kitchen atorvastatin (LIPITOR) 80 MG tablet TAKE 1 TABLET (80 MG TOTAL) BY MOUTH DAILY. (Patient not taking: Reported on 05/19/2016) 90 tablet 3  . enalapril (VASOTEC) 10 MG tablet Take 1 tablet (10 mg total) by mouth 2 (two) times daily. (Patient not taking: Reported on 05/19/2016) 60 tablet 11  . escitalopram (LEXAPRO) 10 MG tablet Take 1 tablet (10 mg total) by mouth daily. (Patient not taking: Reported on 05/19/2016) 90 tablet 3  . ezetimibe (ZETIA) 10 MG tablet Take 1 tablet (10 mg total) by mouth daily. (Patient not taking: Reported on 05/19/2016) 90 tablet 3  . irbesartan (AVAPRO) 300 MG tablet Take 1 tablet (300 mg total) by mouth daily. (Patient not taking: Reported on 05/19/2016) 90 tablet 3  . metoprolol (LOPRESSOR) 50 MG tablet Take 1 tablet (50 mg total) by mouth 2 (two) times daily. (Patient not taking: Reported on 05/19/2016) 180 tablet 3  . nitroGLYCERIN (NITROLINGUAL) 0.4 MG/SPRAY spray Place 1 spray under the tongue every 5 (five) minutes as needed. May repeat up to 3 doses. 12 g 3    Home: Home Living Family/patient expects to be discharged to:: Private residence Living Arrangements: Spouse/significant other Available Help at Discharge: Family, Available PRN/intermittently (spouse works 3rd shift, reports niece can come stay with her) Type of Home: House Home Access: Stairs to enter Technical brewer of Steps: 3 Entrance Stairs-Rails: Right [place]: One level Bathroom Shower/Tub: Tub/shower unit, Walk-in shower Home Equipment: Radio producer - single point Additional Comments: watches 2 yo granddaughter  Functional History: Prior Function Level of Independence: Independent Comments: used cane occasionally when L knee would hurt, watches kids Functional Status:  Mobility: Bed Mobility Overal bed mobility: Modified  Independent General bed mobility comments: used bed rail Transfers Overall transfer level: Needs assistance Equipment used: None Transfers: Sit to/from Stand Sit to Stand: Min guard General transfer comment: pt held onto rail to steady self, min guard to steady pt Ambulation/Gait Ambulation/Gait assistance: Min assist Ambulation Distance (Feet): 150 Feet Assistive device: None Gait Pattern/deviations: Step-through pattern, Decreased stride length, Staggering left General Gait Details: pt consistently running into objects on the L without recognition or problem solving around the obstacle Gait velocity: guarded Gait velocity interpretation: Below normal speed for age/gender Stairs: Yes Stairs assistance: Min assist Stair Management: One rail Right Number of Stairs: 3 (x2) General stair comments: pt leaning to the L, minA to maintain balance without reaching for L rail (only used R HR to mimic home set up)    ADL: ADL Overall ADL's : Needs assistance/impaired Eating/Feeding: Set up, Sitting Eating/Feeding Details (indicate cue type and reason): cues for L side  Grooming: Wash/dry face, Oral care, Set up Toilet Transfer: Min Psychiatric nurse Details (indicate cue type and reason): pt reaching for environmental supports on L side. pt with limp to gait pattern with transfers General ADL Comments: Pt on arrival reports "there is a black spot in my vision over  here" pt reports "its like a dirt I can see it but its like its dirty" pt with testing has greater deficits with white surfaces. Contrast on cell phone turned to more of a grey color to help with reading cell phone  Cognition: Cognition Overall Cognitive Status: Impaired/Different from baseline Orientation Level: Oriented X4 Cognition Arousal/Alertness: Awake/alert Behavior During Therapy: WFL for tasks assessed/performed Overall Cognitive Status: Impaired/Different from baseline Area of Impairment:  Safety/judgement Safety/Judgement: Decreased awareness of deficits, Decreased awareness of safety General Comments: pt with L sided neglect  Blood pressure (!) 167/82, pulse 61, temperature 97.8 F (36.6 C), temperature source Oral, resp. rate 16, height 5\' 3"  (1.6 m), weight 61.2 kg (134 lb 14.7 oz), SpO2 94 %. Physical Exam  Vitals reviewed. Constitutional: She is oriented to person, place, and time. She appears well-developed and well-nourished.  HENT:  Head: Normocephalic and atraumatic.  Eyes: EOM are normal.  Pupils reactive to light  Neck: Normal range of motion. Neck supple. No thyromegaly present.  Cardiovascular: Normal rate and regular rhythm.   Respiratory: Effort normal and breath sounds normal. No respiratory distress.  GI: Soft. Bowel sounds are normal. She exhibits no distension.  Musculoskeletal: She exhibits no edema or tenderness.  Neurological: She is alert and oriented to person, place, and time.  Follows three step commands.  Speech is dysarthric but intelligible Sensation intact to light touch DTRs brisk b/l LE Motor: 4+-5/5 throughout No ataxia, dysmetria.  Skin: Skin is warm and dry.  Psychiatric: She has a normal mood and affect. Her behavior is normal.    Lab Results Last 24 Hours       Results for orders placed or performed during the hospital encounter of 05/19/16 (from the past 24 hour(s))  Protime-INR     Status: None   Collection Time: 05/19/16  2:44 PM  Result Value Ref Range   Prothrombin Time 12.6 11.4 - 15.2 seconds   INR 0.95   APTT     Status: None   Collection Time: 05/19/16  2:44 PM  Result Value Ref Range   aPTT 29 24 - 36 seconds  CBC     Status: None   Collection Time: 05/19/16  2:44 PM  Result Value Ref Range   WBC 9.3 4.0 - 10.5 K/uL   RBC 4.93 3.87 - 5.11 MIL/uL   Hemoglobin 14.6 12.0 - 15.0 g/dL   HCT 42.6 36.0 - 46.0 %   MCV 86.4 78.0 - 100.0 fL   MCH 29.6 26.0 - 34.0 pg   MCHC 34.3 30.0 - 36.0 g/dL    RDW 14.5 11.5 - 15.5 %   Platelets 157 150 - 400 K/uL  Differential     Status: None   Collection Time: 05/19/16  2:44 PM  Result Value Ref Range   Neutrophils Relative % 52 %   Neutro Abs 4.8 1.7 - 7.7 K/uL   Lymphocytes Relative 39 %   Lymphs Abs 3.6 0.7 - 4.0 K/uL   Monocytes Relative 7 %   Monocytes Absolute 0.7 0.1 - 1.0 K/uL   Eosinophils Relative 1 %   Eosinophils Absolute 0.1 0.0 - 0.7 K/uL   Basophils Relative 1 %   Basophils Absolute 0.1 0.0 - 0.1 K/uL  Comprehensive metabolic panel     Status: Abnormal   Collection Time: 05/19/16  2:44 PM  Result Value Ref Range   Sodium 136 135 - 145 mmol/L   Potassium 3.9 3.5 - 5.1 mmol/L   Chloride 108 101 - 111  mmol/L   CO2 25 22 - 32 mmol/L   Glucose, Bld 111 (H) 65 - 99 mg/dL   BUN 9 6 - 20 mg/dL   Creatinine, Ser 0.83 0.44 - 1.00 mg/dL   Calcium 10.0 8.9 - 10.3 mg/dL   Total Protein 6.6 6.5 - 8.1 g/dL   Albumin 3.7 3.5 - 5.0 g/dL   AST 18 15 - 41 U/L   ALT 11 (L) 14 - 54 U/L   Alkaline Phosphatase 101 38 - 126 U/L   Total Bilirubin 0.5 0.3 - 1.2 mg/dL   GFR calc non Af Amer >60 >60 mL/min   GFR calc Af Amer >60 >60 mL/min   Anion gap 3 (L) 5 - 15  I-stat troponin, ED     Status: None   Collection Time: 05/19/16  4:07 PM  Result Value Ref Range   Troponin i, poc 0.01 0.00 - 0.08 ng/mL   Comment 3          I-Stat Chem 8, ED     Status: Abnormal   Collection Time: 05/19/16  4:10 PM  Result Value Ref Range   Sodium 139 135 - 145 mmol/L   Potassium 4.1 3.5 - 5.1 mmol/L   Chloride 103 101 - 111 mmol/L   BUN 9 6 - 20 mg/dL   Creatinine, Ser 0.70 0.44 - 1.00 mg/dL   Glucose, Bld 93 65 - 99 mg/dL   Calcium, Ion 1.26 (H) 1.12 - 1.23 mmol/L   TCO2 27 0 - 100 mmol/L   Hemoglobin 16.0 (H) 12.0 - 15.0 g/dL   HCT 47.0 (H) 36.0 - 46.0 %  Lipid panel     Status: Abnormal   Collection Time: 05/20/16  4:17 AM  Result Value Ref Range   Cholesterol 248 (H) 0 - 200 mg/dL    Triglycerides 145 <150 mg/dL   HDL 54 >40 mg/dL   Total CHOL/HDL Ratio 4.6 RATIO   VLDL 29 0 - 40 mg/dL   LDL Cholesterol 165 (H) 0 - 99 mg/dL  Urine rapid drug screen (hosp performed)     Status: None   Collection Time: 05/20/16  8:18 AM  Result Value Ref Range   Opiates NONE DETECTED NONE DETECTED   Cocaine NONE DETECTED NONE DETECTED   Benzodiazepines NONE DETECTED NONE DETECTED   Amphetamines NONE DETECTED NONE DETECTED   Tetrahydrocannabinol NONE DETECTED NONE DETECTED   Barbiturates NONE DETECTED NONE DETECTED      Imaging Results (Last 48 hours)  Ct Head Wo Contrast  Result Date: 05/19/2016 CLINICAL DATA:  Left-sided facial droop and weakness. Slight slurred speech. EXAM: CT HEAD WITHOUT CONTRAST TECHNIQUE: Contiguous axial images were obtained from the base of the skull through the vertex without intravenous contrast. COMPARISON:  None. FINDINGS: Brain: No evidence of acute hemorrhage, hydrocephalus, extra-axial collection or mass lesion/mass effect. There is a small area of hypoattenuation in the deep white matter of the right frontal lobe, bordering the right lateral ventricle, likely representing an age-indeterminate ischemic infarct. Vascular: No hyperdense vessel or unexpected calcification. Skull: No acute fractures. Sinuses/Orbits: No acute finding. Other: None. IMPRESSION: Small area of hypoattenuation in the deep white matter of the right frontal lobe, which likely represents an age-indeterminate ischemic infarct. Alternatively this may represent an area of demyelination. Mild brain parenchymal atrophy. These results were called by telephone at the time of interpretation on 05/19/2016 at 3:46 pm to Dr. Audie Pinto, who verbally acknowledged these results. Electronically Signed   By: Linwood Dibbles.D.  On: 05/19/2016 15:51   Ct Angio Neck W Or Wo Contrast  Result Date: 05/20/2016 CLINICAL DATA:  Multiple acute infarcts, with symptoms of LEFT facial droop and  slurred speech new onset 05/19/2016. EXAM: CT ANGIOGRAPHY NECK TECHNIQUE: Multidetector CT imaging of the neck was performed using the standard protocol during bolus administration of intravenous contrast. Multiplanar CT image reconstructions and MIPs were obtained to evaluate the vascular anatomy. Carotid stenosis measurements (when applicable) are obtained utilizing NASCET criteria, using the distal internal carotid diameter as the denominator. CONTRAST:  Isovue 370, 50 mL. COMPARISON:  MRI brain and MRA intracranial 05/19/2016. FINDINGS: Aortic arch: Standard branching. Imaged portion shows no evidence of aneurysm or dissection. No significant stenosis of the major arch vessel origins. Mild non stenotic calcific plaque at the origin of the innominate, and LEFT common carotid arteries. Calcific and noncalcific plaque at the origin of LEFT subclavian, estimated 50% stenosis. Right carotid system: Minor calcific plaque. Dolichoectatic cervical ICA. No evidence of dissection, stenosis (50% or greater) or occlusion. Left carotid system: Incidental nonocclusive and non stenotic web, LEFT ICA origin No evidence of dissection, stenosis (50% or greater) or occlusion. Vertebral arteries: Codominant. No evidence of dissection, stenosis (50% or greater) or occlusion. Skeleton: Cervical spondylosis with significant disc space narrowing at C5-6 and C6-7. The patient is edentulous. Other neck: No neck masses. Upper chest: No lung apex lesion.  Minor changes of COPD. IMPRESSION: No carotid bifurcation stenosis or dissection. Minor atheromatous change at the great vessel origins of the innominate and LEFT common carotid artery, with calcific and noncalcific plaque resulting in 50% stenosis LEFT subclavian origin. Electronically Signed   By: Staci Righter M.D.   On: 05/20/2016 09:53   Mr Brain Wo Contrast  Result Date: 05/19/2016 CLINICAL DATA:  65 y/o  F; left facial droop and slurred speech. EXAM: MRI HEAD WITHOUT CONTRAST  MRA HEAD WITHOUT CONTRAST TECHNIQUE: Multiplanar, multiecho pulse sequences of the brain and surrounding structures were obtained without intravenous contrast. Angiographic images of the head were obtained using MRA technique without contrast. COMPARISON:  05/19/2016 CT head. FINDINGS: MRI HEAD FINDINGS Brain: There are small foci of diffusion restriction within the right anterior paramedian superior pons, the right posterior limb of internal capsule, the right mid corona radiata, and the left parietal operculum consistent with acute infarction. There additional foci of diffusion hyperintensity without low ADC in the left splenium of corpus callosum and left occipital lobe which may represent late subacute/chronic area of ischemia. Chronic lacunar infarct within the right inferior pons and probable small old infarct in the right posterior medial frontal lobe. No abnormal susceptibility hypointensity to indicate intracranial hemorrhage. Extra-axial space: No hydrocephalus. No extra-axial collection is identified. Cavum septum pellucidum. Other: Mild maxillary sinus mucosal thickening. No abnormal signal of the mastoid air cells. Orbits are unremarkable. Calvarium is unremarkable. 13 mm Thornwaldt cyst. MRA HEAD FINDINGS Internal carotid arteries: Patent. Anterior cerebral arteries: Patent. Middle cerebral arteries: Patent. Anterior communicating artery: Patent. Posterior communicating arteries: Possible diminutive right. No left identified, hypoplastic or absent. Posterior cerebral arteries: Patent. Basilar artery: Patent. Vertebral arteries: Patent. No evidence of high-grade stenosis, large vessel occlusion, or aneurysm unless noted above. IMPRESSION: 1. Several small acute/early subacute infarctions within the right anterior paramedian superior pons, right posterior limb of internal capsule, right mid corona radiata, and left parietal operculum. Symptoms of left-sided facial droop and slurred speech are probably  attributable to the infarction in right posterior limb of internal capsule. 2. Small late subacute to chronic infarcts  within the left splenium of corpus callosum, left occipital lobe, and right inferior pons. 3. No acute intracranial hemorrhage. 4. Mild paranasal sinus disease. 5. No occlusion, aneurysm, dissection, or significant stenosis of the circle of Willis is identified. Electronically Signed   By: Kristine Garbe M.D.   On: 05/19/2016 21:46   Mr Jodene Nam Head/brain [id number] Cm  Result Date: 05/19/2016 CLINICAL DATA:  65 y/o  F; left facial droop and slurred speech. EXAM: MRI HEAD WITHOUT CONTRAST MRA HEAD WITHOUT CONTRAST TECHNIQUE: Multiplanar, multiecho pulse sequences of the brain and surrounding structures were obtained without intravenous contrast. Angiographic images of the head were obtained using MRA technique without contrast. COMPARISON:  05/19/2016 CT head. FINDINGS: MRI HEAD FINDINGS Brain: There are small foci of diffusion restriction within the right anterior paramedian superior pons, the right posterior limb of internal capsule, the right mid corona radiata, and the left parietal operculum consistent with acute infarction. There additional foci of diffusion hyperintensity without low ADC in the left splenium of corpus callosum and left occipital lobe which may represent late subacute/chronic area of ischemia. Chronic lacunar infarct within the right inferior pons and probable small old infarct in the right posterior medial frontal lobe. No abnormal susceptibility hypointensity to indicate intracranial hemorrhage. Extra-axial space: No hydrocephalus. No extra-axial collection is identified. Cavum septum pellucidum. Other: Mild maxillary sinus mucosal thickening. No abnormal signal of the mastoid air cells. Orbits are unremarkable. Calvarium is unremarkable. 13 mm Thornwaldt cyst. MRA HEAD FINDINGS Internal carotid arteries: Patent. Anterior cerebral arteries: Patent. Middle cerebral  arteries: Patent. Anterior communicating artery: Patent. Posterior communicating arteries: Possible diminutive right. No left identified, hypoplastic or absent. Posterior cerebral arteries: Patent. Basilar artery: Patent. Vertebral arteries: Patent. No evidence of high-grade stenosis, large vessel occlusion, or aneurysm unless noted above. IMPRESSION: 1. Several small acute/early subacute infarctions within the right anterior paramedian superior pons, right posterior limb of internal capsule, right mid corona radiata, and left parietal operculum. Symptoms of left-sided facial droop and slurred speech are probably attributable to the infarction in right posterior limb of internal capsule. 2. Small late subacute to chronic infarcts within the left splenium of corpus callosum, left occipital lobe, and right inferior pons. 3. No acute intracranial hemorrhage. 4. Mild paranasal sinus disease. 5. No occlusion, aneurysm, dissection, or significant stenosis of the circle of Willis is identified. Electronically Signed   By: Kristine Garbe M.D.   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  Records reviewed and summated above. Stroke: Continue secondary stroke prophylaxis and Risk Factor Modification listed below:   Antiplatelet therapy:   Blood Pressure Management:  Continue current medication with prn's with permisive HTN per primary team Statin Agent:   Tobacco abuse:    1. Does the need for close, 24 hr/day medical supervision in concert with the patient's rehab needs make it unreasonable for this patient to be served in a less intensive setting? Potentially 2. Co-Morbidities requiring supervision/potential complications: CAD status post MI (cont meds), HTN (monitor and provide prns in accordance with increased  physical exertion and pain), sickle cell trait (cont to monitor for crisis), tobacco abuse (counsel) 3. Due to safety, disease management and patient education, does the patient require 24 hr/day rehab nursing? Potentially 4. Does the patient require coordinated care of a physician, rehab nurse, PT (1-2 hrs/day, 5 days/week) and OT (1-2 hrs/day, 5 days/week)  to address physical and functional deficits in the context of the above medical diagnosis(es)? Potentially Addressing deficits in the following areas: balance, endurance, locomotion, strength, transferring, bathing, dressing, toileting and psychosocial support 5. Can the patient actively participate in an intensive therapy program of at least 3 hrs of therapy per day at least 5 days per week? Yes 6. The potential for patient to make measurable gains while on inpatient rehab is excellent 7. Anticipated functional outcomes upon discharge from inpatient rehab are modified independent  with PT, modified independent with OT, n/a with SLP. 8. Estimated rehab length of stay to reach the above functional goals is: 8-12 days. 9. Does the patient have adequate social supports and living environment to accommodate these discharge functional goals? Yes 10. Anticipated D/C setting: Home 11. Anticipated post D/C treatments: HH therapy and Home excercise program 12. Overall Rehab/Functional Prognosis: good  RECOMMENDATIONS: This patient's condition is appropriate for continued rehabilitative care in the following setting: CIR after completion of medical workup  Patient has agreed to participate in recommended program. Potentially Note that insurance prior authorization may be required for reimbursement for recommended care.  Comment: Rehab Admissions Coordinator to follow up.  Delice Lesch, MD 05/20/2016    Revision History

## 2016-05-22 NOTE — Progress Notes (Signed)
05/22/16 1745 nursing Patient admitted to inpatient Rehab per wheelchair accompanied by RN. Oriented to unit set up ; fall contract discussed and signed by patient. Reminded patient to call before going to bathroom per patient she goes to bathroom herself in the acute care. No further questions as of this time.

## 2016-05-22 NOTE — Care Management Note (Signed)
Case Management Note  Patient Details  Name: Elizabeth [name] MRN: [id number] Date of Birth: [birth year]  Subjective/Objective:                    Action/Plan: Pt discharging to CIR today. No further needs per CM.   Expected Discharge Date:                  Expected Discharge Plan:  [place]  In-House Referral:     Discharge planning Services  CM Consult  Post Acute Care Choice:    Choice offered to:     DME Arranged:    DME Agency:     HH Arranged:    [place] Agency:     Status of Service:  Completed, signed off  If discussed at [employer] of Stay Meetings, dates discussed:    Additional Comments:  Pollie Friar, RN 05/22/2016, 11:08 AM

## 2016-05-22 NOTE — Progress Notes (Signed)
Patient transferred to 4W08. Neuro assessment unchanged

## 2016-05-22 NOTE — Progress Notes (Signed)
Gerlean Ren [facility] Admission Coordinator Signed Physical Medicine and Rehabilitation  PMR Pre-admission Date of Service: 05/22/2016 3:21 PM  Related encounter: ED to Hosp-Admission (Current) from 05/19/2016 in [place]       [] Hide copied text PMR Admission Coordinator Pre-Admission Assessment  Patient: Elizabeth [name] is an 65 y.o., female MRN: [id number] DOB: 06-22-1951 Height: 5\' 3"  (160 cm) Weight: 61.2 kg (134 lb 14.7 oz)                                                                                                                                                                                                                                                                          Insurance Information HMO:     PPO:      PCP:      IPA:      80/20:      OTHER: [employer] POS PRIMARY: [employer]      Policy#:  [id number]      Subscriber:  Spouse, Elizabeth [name] CM Name:  [name]      Phone#:  [phone]     Fax#:  EMR Access Pre-Cert#:  [id number], follow up to be done via EMR access      Employer:  Pt. not employed Benefits:  Phone #:  [phone]     Name:  [name]. Date:  03/23/14     Deduct:  $0      Out of Pocket Max: $500      Life Max:  n/a CIR:  80%/20%      SNF:  80%/20% Outpatient:  $17 copay per visit     Co-Pay:  Home Health:  100%      Co-Pay:   DME:  100%     Co-Pay:  Providers:  In network SECONDARY:       Policy#:       Subscriber:  CM Name:       Phone#:      Fax#:  Pre-Cert#:       Employer:  Benefits:  Phone #:      Name:  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 Per chart review patient lives with spouse. Independent with a cane prior to admission. One level home with 3 steps to entry. Spouse works third shift. Reports there is a niece that can assist. Presented 05/19/2016 with left facial droop and slurred speech. By report patient has not seen her PCP since 2014 and ran out of her blood pressure medications 2 weeks prior to admission. CT/MRI showed several small acute early subacute infarcts within the right anterior paramedian superior pons, right posterior limb of internal capsule, right mid corona radiata and left parietal operculum. Small late subacute to chronic infarcts within the left splenium of corpus colostrum, left occipital lobe and right inferior pons. MRA of the head with no occlusion aneurysm or dissection without significant stenosis. CT angiogram of the neck no carotid bifurcation stenosis or dissection. Patient did not receive TPA. Underwent TEE08/29/2017 showing normal LV systolic function.and placement of loop recorder 05/21/2016.. Currently maintained on aspirin for CVA prophylaxis. Subcutaneous Lovenox for DVT prophylaxis.Lower extremity Doppler studies negative for DVT.Tolerating a regular consistency diet. Physical and occupational therapy evaluation completed with recommendations of physical medicine rehabilitation consult.Patient was admitted for a comprehensive rehabilitation program  Total: 3 NIH    Past Medical History      Past Medical History:  Diagnosis Date  . Abrasion or friction burn of foot and toe(s), with infection     . Allergy    soy  . Anxiety   . 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 Degenerative joint disease    Generalized  . Hyperlipidemia   . Hypertension   . Myocardial infarction ([place])   . Sickle cell trait ([place])   . Tobacco abuse April, 2011   Continued    Family History  family history includes Coronary artery disease in her father and mother.  Prior Rehab/Hospitalizations:  Has the patient had major surgery during 100 days prior to admission? No  Current Medications   Current Facility-Administered Medications:  .   stroke: mapping our early stages of recovery book, , Does not apply, Once, Janece Canterbury, MD .  0.9 %  sodium chloride infusion, , Intravenous, Continuous, Janece Canterbury, MD .  amLODipine (NORVASC) tablet 5 mg, 5 mg, Oral, Daily, Janece Canterbury, MD, 5 mg at 05/22/16 1039 .  aspirin suppository 300 mg, 300 mg, Rectal, Daily **OR** aspirin tablet 325 mg, 325 mg, Oral, Daily, Janece Canterbury, MD, 325 mg at 05/22/16 1039 .  atorvastatin (LIPITOR) tablet 80 mg, 80 mg, Oral, q1800, Janece Canterbury, MD, 80 mg at  05/21/16 1754 .  enoxaparin (LOVENOX) injection 40 mg, 40 mg, Subcutaneous, Q24H, Janece Canterbury, MD, 40 mg at 05/20/16 1908 .  escitalopram (LEXAPRO) tablet 10 mg, 10 mg, Oral, Daily, Janece Canterbury, MD, 10 mg at 05/22/16 1039 .  ezetimibe (ZETIA) tablet 10 mg, 10 mg, Oral, Daily, Janece Canterbury, MD, 10 mg at 05/22/16 1039 .  irbesartan (AVAPRO) tablet 300 mg, 300 mg, Oral, Daily, Dorie Rank, MD, 300 mg at 05/22/16 1039 .  metoprolol tartrate (LOPRESSOR) tablet 50 mg, 50 mg, Oral, BID, Dorie Rank, MD, 50 mg at 05/22/16 1039 .  senna-docusate (Senokot-S) tablet 1 tablet, 1 tablet, Oral, QHS PRN, Janece Canterbury, MD  Patients Current Diet: Diet - low sodium heart healthy Diet Heart Room service appropriate? 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  She does report she dirves and usually goes out 3 x /week to grocery store and other errands  [place] / [place] Devices/Equipment: Dentures (specify type) Home Equipment: Cane - single point  Prior Device Use: Indicate devices/aids used by the patient prior to current illness, exacerbation or injury? pt. 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  Independent  Indoor Mobility: Did the patient need assistance with walking from room to room (with or without device)? Independent  Stairs: Did the patient need assistance with internal or external stairs (with or without device)? Independent  Functional Cognition: Did the patient need help planning regular tasks such as shopping or remembering to take medications? Independent  Current Functional Level Cognition Arousal/Alertness: Awake/alert Overall Cognitive Status: Within Functional Limits for tasks assessed Orientation Level: Oriented X4 Safety/Judgement: Decreased awareness of deficits, Decreased awareness of safety General Comments: pt with L sided neglect Attention: Sustained Sustained Attention: Impaired Sustained Attention Impairment: Verbal basic Memory: Impaired Memory Impairment: Retrieval deficit, Decreased recall of new information Awareness: Impaired Awareness  Impairment: Emergent impairment, Anticipatory impairment Problem Solving: Impaired Problem Solving Impairment: Verbal basic Safety/Judgment: Impaired    Extremity Assessment (includes Sensation/Coordination) Upper Extremity Assessment: LUE deficits/detail LUE Deficits / Details: grossly 4-/5  Lower Extremity Assessment: Defer to PT evaluation LLE Deficits / Details: grossly 4/5   ADLs Overall ADL's : Needs assistance/impaired Eating/Feeding: Set up, Sitting Eating/Feeding Details (indicate cue type and reason): cues for L side  Grooming: Wash/dry face, Oral care, Set up Upper Body Bathing: Standing, Min guard (simulated) Upper Body Bathing Details (indicate cue type and reason): cues fro L side thoroughness Toilet Transfer: Min guard, Regular Toilet, Grab bars, Ambulation Toilet Transfer Details (indicate cue type and reason): pt reaching for environmental supports on L side. pt with limp to gait pattern with transfers Toileting- Water quality scientist and Hygiene: Min guard, Sit to/from stand Tub/ Banker: Min guard, Ambulation Functional mobility during ADLs: Min guard General ADL Comments: pt istructed to identify ADL items that were placed to her L side and pt abe to identify 2/6 items. Was  able to gather all 6 items when rotating head to L side of table   Mobility Overal bed mobility: Modified Independent General bed mobility comments: OOB in chair upon arrival   Transfers Overall transfer level: Needs assistance Equipment used: None Transfers: Sit to/from Stand Sit to Stand: Min guard General transfer comment: min guard for safety. L foot dragging   Ambulation / Gait / Stairs / Wheelchair Mobility Ambulation/Gait Ambulation/Gait assistance: Min guard, Min assist Ambulation Distance (Feet): 150 Feet Assistive device: None Gait Pattern/deviations: Step-through pattern, Decreased stride length General Gait Details: pt with improved awareness of L side however does demo  some L side neglect when asked to look toward L side; LOB with horizontal head turns toward L side with min A required to recover balance Gait velocity: decreased Gait velocity interpretation: Below normal speed for age/gender Stairs: Yes Stairs assistance: Min guard Stair Management: One rail Right Number of Stairs: 3 General stair comments: cues for sequencing and safety awareness; min guard for safety   Posture / Balance Balance Overall balance assessment: Needs assistance Sitting-balance support: No upper extremity supported, Feet supported Sitting balance-Leahy Scale: Good Standing balance support: Single extremity supported, During functional activity Standing balance-Leahy Scale: Poor Standardized Balance Assessment Standardized Balance Assessment : Dynamic Gait Index Dynamic Gait Index Level Surface: Mild Impairment Change in Gait Speed: Mild Impairment Gait with Horizontal Head Turns: Moderate Impairment Gait with Vertical Head Turns: Mild Impairment Gait and Pivot Turn: Mild Impairment Step Over Obstacle: Mild Impairment Step Around Obstacles: Mild Impairment Steps: Mild Impairment Total Score: 15   Special needs/care consideration BiPAP/CPAP   no CPM   no Continuous Drip IV   no Dialysis   no         Life Vest   no Oxygen   no Special Bed   no Trach Size   no Wound Vac (area)   no       Skin   WDL per nursing assessment                               Bowel mgmt:  Last BM 05/21/16, continent Bladder mgmt: continent Diabetic mgmt  no    Previous Home Environment Living Arrangements: Spouse/significant other  Lives With: Spouse Available Help at Discharge: Family, Available PRN/intermittently (spouse works 3rd shift, reports niece can come stay with her) Type of Home: House Home Layout: One level Home Access: Stairs to enter Entrance Stairs-Rails: Right Entrance Stairs-Number of Steps: 3 Bathroom Shower/Tub: Public librarian, [employer] [facility]  Services: No Additional Comments: watches 2 yo granddaughter  Discharge Living Setting Type of Home at Discharge: House Discharge Home Layout: One level Discharge Home Access: Stairs to enter Entrance Stairs-Rails: Right Entrance Stairs-Number of Steps: 2 Discharge Bathroom Shower/Tub: Tub/shower unit, Walk-in shower Discharge Bathroom Toilet: Standard Discharge Bathroom Accessibility: Yes How Accessible: Accessible via walker Does the patient have any problems obtaining your medications?: No  Social/Family/Support Systems Patient Roles: Spouse Anticipated Caregiver: Husband, Elizabeth [name] Anticipated Caregiver's Contact Information: Elizabeth [name], cell [phone] Ability/Limitations of Caregiver: Elizabeth [name] works 4pm-midnight Caregiver Availability: Intermittent Discharge Plan Discussed with Primary Caregiver: Yes Is Caregiver In Agreement with Plan?: Yes Does Caregiver/Family have Issues with Lodging/Transportation while Pt is in Rehab?: No   Goals/Additional Needs Patient/Family Goal for Rehab: modified independent PT/OT/SLP Expected length of stay: 8-12 days Cultural Considerations: n/a Dietary Needs: heart healthy, thin liquids Equipment Needs: TBA Pt/Family Agrees to Admission and willing to participate:  Yes Program Orientation Provided & Reviewed with Pt/Caregiver Including Roles  & Responsibilities: Yes   Decrease burden of Care through IP rehab admission: n/a   Possible need for SNF placement upon discharge:   Not expected   Patient Condition: This patient's medical and functional status has changed since the consult dated _8/28/17_____ in which the Rehabilitation Physician determined and documented that the patient was potentially appropriate for intensive rehabilitative care in an inpatient rehabilitation facility. Issues have been addressed and update has been discussed with Dr. Posey Pronto and patient now appropriate for inpatient rehabilitation. Will admit to  inpatient rehab today.    Preadmission Screen Completed By:  Gerlean Ren, 05/22/2016 3:43 PM ______________________________________________________________________   Discussed status with Dr.  Posey Pronto on 05/22/16 at  1544  and received telephone approval for admission today.  Admission Coordinator:  Gerlean Ren, time [id number] /Date 05/22/16       Cosigned by: Ankit Lorie Phenix, MD at 05/22/2016 3:47 PM

## 2016-05-22 NOTE — PMR Pre-admission (Signed)
PMR Admission Coordinator Pre-Admission Assessment  Patient: Elizabeth [name] is an 65 y.o., female MRN: [id number] DOB: 1950/10/20 Height: 5\' 3"  (160 cm) Weight: 61.2 kg (134 lb 14.7 oz)              Insurance Information HMO:     PPO:      PCP:      IPA:      80/20:      OTHER: [employer] POS PRIMARY: [employer]      Policy#:  [id number]      Subscriber:  Spouse, [name] CM Name:  [name]      Phone#:  [phone]     Fax#:  EMR Access Pre-Cert#:  [id number], follow up to be done via EMR access      Employer:  Pt. not employed Benefits:  Phone #:  [phone]     Name:  [name]. Date:  03/23/14     Deduct:  $0      Out of Pocket Max: $500      Life Max:  n/a CIR:  80%/20%      SNF:  80%/20% Outpatient:  $17 copay per visit     Co-Pay:  Home Health:  100%      Co-Pay:   DME:  100%     Co-Pay:  Providers:  In network SECONDARY:       Policy#:       Subscriber:  CM Name:       Phone#:      Fax#:  Pre-Cert#:       Employer:  Benefits:  Phone #:      Name:  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 Per chart review patient lives with spouse. Independent with a cane prior to admission. One level home with 3 steps to entry. Spouse works third shift.  Reports there is a niece that can assist. Presented 05/19/2016 with left facial droop and slurred speech. By report patient has not seen her PCP since 2014 and ran out of her blood pressure medications 2 weeks prior to admission. CT/MRI showed several small acute early subacute infarcts within the right anterior paramedian superior pons, right posterior limb of internal capsule, right mid corona radiata and left parietal operculum. Small late subacute to chronic infarcts within the left splenium of corpus colostrum, left occipital lobe and right inferior pons. MRA of the head with no occlusion aneurysm or dissection without significant stenosis. CT angiogram of the neck no carotid bifurcation stenosis or dissection. Patient did not receive TPA. Underwent TEE 05/21/2016 showing normal  LV systolic function. and placement of loop recorder 05/21/2016.. Currently maintained on aspirin for CVA prophylaxis. Subcutaneous Lovenox for DVT prophylaxis.Lower extremity Doppler studies negative for DVT. Tolerating a regular consistency diet. Physical and occupational therapy evaluation completed with recommendations of physical medicine rehabilitation consult.Patient was admitted for a comprehensive rehabilitation program  Total: 3 NIH    Past Medical History  Past Medical History:  Diagnosis Date  . Abrasion or friction burn of foot and toe(s), with infection   . Allergy    soy  . Anxiety   . 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 Degenerative joint disease    Generalized  . Hyperlipidemia   . Hypertension   . Myocardial infarction (HCC)   . Sickle cell trait (HCC)   . Tobacco abuse April, 2011   Continued    Family History  family history includes Coronary artery disease in her father and mother.  Prior Rehab/Hospitalizations:  Has the patient had major surgery during 100 days prior to admission?  No  Current Medications   Current Facility-Administered Medications:  .   stroke: mapping our early stages of recovery book, , Does not apply, Once, Renae Fickle, MD .  0.9 %  sodium chloride infusion, , Intravenous, Continuous, Renae Fickle, MD .  amLODipine (NORVASC) tablet 5 mg, 5 mg, Oral, Daily, Renae Fickle, MD, 5 mg at 05/22/16 1039 .  aspirin suppository 300 mg, 300 mg, Rectal, Daily **OR** aspirin tablet 325 mg, 325 mg, Oral, Daily, Renae Fickle, MD, 325 mg at 05/22/16 1039 .  atorvastatin (LIPITOR) tablet 80 mg, 80 mg, Oral, q1800, Renae Fickle, MD, 80 mg at 05/21/16 1754 .  enoxaparin (LOVENOX) injection 40 mg, 40 mg, Subcutaneous, Q24H, Renae Fickle, MD, 40 mg at 05/20/16 1908 .  escitalopram (LEXAPRO) tablet 10 mg, 10 mg, Oral, Daily, Renae Fickle, MD, 10 mg at 05/22/16 1039 .  ezetimibe (ZETIA) tablet 10 mg, 10 mg, Oral, Daily, Renae Fickle, MD, 10 mg at 05/22/16 1039 .  irbesartan (AVAPRO) tablet 300 mg, 300 mg, Oral, Daily, Linwood Dibbles, MD, 300 mg at 05/22/16 1039 .  metoprolol tartrate (LOPRESSOR) tablet 50 mg, 50 mg, Oral, BID, Linwood Dibbles, MD, 50 mg at 05/22/16 1039 .  senna-docusate (Senokot-S) tablet 1 tablet, 1 tablet, Oral, QHS PRN, Renae Fickle, MD  Patients Current Diet: Diet - low sodium heart healthy Diet Heart Room service appropriate? Yes; Fluid consistency: Thin  Precautions / Restrictions Precautions Precautions: Fall Precaution Comments: L visual field deficits Restrictions Weight Bearing Restrictions: No   Has the patient had 2 or more falls or a fall with injury in the past year?No, though did have a fall that was precipitated by her new CVA  Prior Activity Level Community (5-7x/wk): Pt. 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  She does report she dirves and usually goes out 3 x /week to grocery store and other errands  Home Assistive Devices / Equipment Home Assistive Devices/Equipment: Dentures  (specify type) Home Equipment: Cane - single point  Prior Device Use: Indicate devices/aids used by the patient prior to current illness, exacerbation or injury? pt. uses cane on occasion when her knee botherd her   Prior Functional Level Prior Function Level of Independence: Independent Comments: used cane occasionally when L knee would hurt, watches kids  Self Care: Did the patient need help bathing, dressing, using the toilet or  eating?  Independent  Indoor Mobility: Did the patient need assistance with walking from room to room (with or without device)? Independent  Stairs: Did the patient need assistance with internal or external stairs (with or without device)? Independent  Functional Cognition: Did the patient need help planning regular tasks such as shopping or remembering to take medications? Independent  Current Functional Level Cognition  Arousal/Alertness: Awake/alert Overall Cognitive Status: Within Functional Limits for tasks assessed Orientation Level: Oriented X4 Safety/Judgement: Decreased awareness of deficits, Decreased awareness of safety General Comments: pt with L sided neglect Attention: Sustained Sustained Attention: Impaired Sustained Attention Impairment: Verbal basic Memory: Impaired Memory Impairment: Retrieval deficit, Decreased recall of new information Awareness: Impaired Awareness Impairment: Emergent impairment, Anticipatory impairment Problem Solving: Impaired Problem Solving Impairment: Verbal basic Safety/Judgment: Impaired    Extremity Assessment (includes Sensation/Coordination)  Upper Extremity Assessment: LUE deficits/detail LUE Deficits / Details: grossly 4-/5  Lower Extremity Assessment: Defer to PT evaluation LLE Deficits / Details: grossly 4/5    ADLs  Overall ADL's : Needs assistance/impaired Eating/Feeding: Set up, Sitting Eating/Feeding Details (indicate cue type and reason): cues for L side  Grooming: Wash/dry face, Oral  care, Set up Upper Body Bathing: Standing, Min guard (simulated) Upper Body Bathing Details (indicate cue type and reason): cues fro L side thoroughness Toilet Transfer: Min guard, Regular Toilet, Grab bars, Ambulation Toilet Transfer Details (indicate cue type and reason): pt reaching for environmental supports on L side. pt with limp to gait pattern with transfers Toileting- ArchitectClothing Manipulation and Hygiene: Min guard, Sit to/from stand Tub/ Engineer, structuralhower Transfer: Min guard, Ambulation Functional mobility during ADLs: Min guard General ADL Comments: pt istructed to identify ADL items that were placed to her L side and pt abe to identify 2/6 items. Was able to gather all 6 items when rotating head to L side of table    Mobility  Overal bed mobility: Modified Independent General bed mobility comments: OOB in chair upon arrival    Transfers  Overall transfer level: Needs assistance Equipment used: None Transfers: Sit to/from Stand Sit to Stand: Min guard General transfer comment: min guard for safety. L foot dragging    Ambulation / Gait / Stairs / Wheelchair Mobility  Ambulation/Gait Ambulation/Gait assistance: Min guard, Min assist Ambulation Distance (Feet): 150 Feet Assistive device: None Gait Pattern/deviations: Step-through pattern, Decreased stride length General Gait Details: pt with improved awareness of L side however does demo some L side neglect when asked to look toward L side; LOB with horizontal head turns toward L side with min A required to recover balance Gait velocity: decreased Gait velocity interpretation: Below normal speed for age/gender Stairs: Yes Stairs assistance: Min guard Stair Management: One rail Right Number of Stairs: 3 General stair comments: cues for sequencing and safety awareness; min guard for safety    Posture / Balance Balance Overall balance assessment: Needs assistance Sitting-balance support: No upper extremity supported, Feet  supported Sitting balance-Leahy Scale: Good Standing balance support: Single extremity supported, During functional activity Standing balance-Leahy Scale: Poor Standardized Balance Assessment Standardized Balance Assessment : Dynamic Gait Index Dynamic Gait Index Level Surface: Mild Impairment Change in Gait Speed: Mild Impairment Gait with Horizontal Head Turns: Moderate Impairment Gait with Vertical Head Turns: Mild Impairment Gait and Pivot Turn: Mild Impairment Step Over Obstacle: Mild Impairment Step Around Obstacles: Mild Impairment Steps: Mild Impairment Total Score: 15    Special needs/care consideration BiPAP/CPAP   no CPM   no Continuous Drip IV   no Dialysis   no  Life Vest   no Oxygen   no Special Bed   no Trach Size   no Wound Vac (area)   no       Skin   WDL per nursing assessment                               Bowel mgmt:  Last BM 05/21/16, continent Bladder mgmt: continent Diabetic mgmt  no     Previous Home Environment Living Arrangements: Spouse/significant other  Lives With: Spouse Available Help at Discharge: Family, Available PRN/intermittently (spouse works 3rd shift, reports niece can come stay with her) Type of Home: House Home Layout: One level Home Access: Stairs to enter Entrance Stairs-Rails: Right Entrance Stairs-Number of Steps: 3 Bathroom Shower/Tub: Public librarian, Gaffer Home Care Services: No Additional Comments: watches 2 yo granddaughter  Discharge Living Setting Type of Home at Discharge: House Discharge Home Layout: One level Discharge Home Access: Stairs to enter Entrance Stairs-Rails: Right Entrance Stairs-Number of Steps: 2 Discharge Bathroom Shower/Tub: Tub/shower unit, Walk-in shower Discharge Bathroom Toilet: Standard Discharge Bathroom Accessibility: Yes How Accessible: Accessible via walker Does the patient have any problems obtaining your medications?: No  Social/Family/Support Systems Patient  Roles: Spouse Anticipated Caregiver: Husband, Sangeeta Morphis Anticipated Caregiver's Contact Information: Tahani Latulippe, cell [phone] Ability/Limitations of Caregiver: Denyse Amass works 4pm-midnight Caregiver Availability: Intermittent Discharge Plan Discussed with Primary Caregiver: Yes Is Caregiver In Agreement with Plan?: Yes Does Caregiver/Family have Issues with Lodging/Transportation while Pt is in Rehab?: No   Goals/Additional Needs Patient/Family Goal for Rehab: modified independent PT/OT/SLP Expected length of stay: 8-12 days Cultural Considerations: n/a Dietary Needs: heart healthy, thin liquids Equipment Needs: TBA Pt/Family Agrees to Admission and willing to participate: Yes Program Orientation Provided & Reviewed with Pt/Caregiver Including Roles  & Responsibilities: Yes   Decrease burden of Care through IP rehab admission: n/a   Possible need for SNF placement upon discharge:   Not expected   Patient Condition: This patient's medical and functional status has changed since the consult dated _8/28/17_____ in which the Rehabilitation Physician determined and documented that the patient was potentially appropriate for intensive rehabilitative care in an inpatient rehabilitation facility. Issues have been addressed and update has been discussed with Dr. Posey Pronto and patient now appropriate for inpatient rehabilitation. Will admit to inpatient rehab today.    Preadmission Screen Completed By:  Gerlean Ren, 05/22/2016 3:43 PM ______________________________________________________________________   Discussed status with Dr.  Posey Pronto on 05/22/16 at  1544  and received telephone approval for admission today.  Admission Coordinator:  Gerlean Ren, time [id number] /Date 05/22/16

## 2016-05-22 NOTE — Progress Notes (Signed)
Occupational Therapy Treatment Patient Details Name: Claudie Pajares MRN: [id number] DOB: [birth year] Today's Date: 05/22/2016    History of present illness Leetta Everettis a 65 y.o.femalewith medical history significant of CAD s/p MI x 3 with three DES, hypertension, hyperlipidemia, anxiety, arthritis, sickle cell trait, active smoking who presents with left facial droop and slurred speech. MRI revealed severl small acute infarct in anterior paramedian superior pons, R posterior limb of internal capsule, R mid corona radiata, and L parietal operculum.   OT comments  Pt making progress with functional goals. Plans to d/c to CIR upon insurance approval. Acute OT will continue to follow  Follow Up Recommendations  CIR    Equipment Recommendations  Other (comment) (TBD at next venue of care)    Recommendations for Other Services      Precautions / Restrictions Precautions Precautions: Fall Precaution Comments: L visual field deficits Restrictions Weight Bearing Restrictions: No       Mobility Bed Mobility               General bed mobility comments: OOB in chair upon arrival  Transfers Overall transfer level: Needs assistance Equipment used: None Transfers: Sit to/from Stand Sit to Stand: Min guard         General transfer comment: min guard for safety. L foot dragging    Balance     Sitting balance-Leahy Scale: Good       Standing balance-Leahy Scale: Poor                     ADL Overall ADL's : Needs assistance/impaired         Upper Body Bathing: Standing;Min guard (simulated) Upper Body Bathing Details (indicate cue type and reason): cues fro L side thoroughness             Toilet Transfer: Min guard;Regular Toilet;Grab bars;Ambulation   Toileting- Clothing Manipulation and Hygiene: Min guard;Sit to/from stand   Tub/ Shower Transfer: Min guard;Ambulation   Functional mobility during ADLs: Min guard General ADL Comments: pt  istructed to identify ADL items that were placed to her L side and pt abe to identify 2/6 items. Was able to gather all 6 items when rotating head to L side of table      Vision  L visual field impaired, L neglect                              Cognition   Behavior During Therapy: WFL for tasks assessed/performed Overall Cognitive Status: Within Functional Limits for tasks assessed Area of Impairment: Awareness;Safety/judgement          Safety/Judgement: Decreased awareness of deficits;Decreased awareness of safety     General Comments: pt with L sided neglect    Extremity/Trunk Assessment   L UE weakness                        General Comments  pt very pleasant and cooperative    Pertinent Vitals/ Pain       Pain Assessment: No/denies pain  Home Living  lives at home with husband                                        Prior Functioning/Environment  independent            Frequency Min 3X/week  Progress Toward Goals  OT Goals(current goals can now be found in the care plan section)  Progress towards OT goals: Progressing toward goals     Plan Discharge plan remains appropriate                     End of Session Equipment Utilized During Treatment: Gait belt   Activity Tolerance Patient tolerated treatment well   Patient Left in chair             Time: [id number] OT Time Calculation (min): 24 min  Charges: OT General Charges $OT Visit: 1 Procedure OT Treatments $Self Care/Home Management : 8-22 mins $Therapeutic Activity: 8-22 mins  Britt Bottom 05/22/2016, 12:53 PM

## 2016-05-22 NOTE — Progress Notes (Signed)
Inpatient Rehabilitation  I have received insurance approval to admit pt. to CIR today.  Discussed with Dr. Algis Liming and he believes pt. Is ready to DC to CIR.  I have updated pt. and husband.  I will make arrangements for admission later this afternoon.  I updated Jacqualin Combes, RNCM  of plan.  Please call if questions.  [facility] Admissions Coordinator Cell [phone] Office [phone]

## 2016-05-22 NOTE — Progress Notes (Signed)
Progress note  Reviewed discharge summary done by Dr. Sheran Fava on 05/21/16. As per report, patient medically stable for discharge to CIR when bed available. She completed evaluation for stroke. Neurology had been consulted. Interviewed and examined patient this morning. Spouse at bedside. Patient states that she continues to improve. Still has left sided facial droop but this is improving. No complaints reported. As per RN, no acute issues.Blood pressures mildly uncontrolled and fluctuating but other vital signs stable.RS: Clear to auscultation without increased work of breathing, CVS: S1 and S2 heard, RRR. No JVD, murmurs or pedal edema Telemetry: Sinus bradycardia in the high 50s-sinus rhythm. Abdomen: Nondistended, soft and non tender. Normal bowel sounds heard. CNS: Alert and oriented 3.Left facial weakness.Grade 5 x 5 power in right limbs and grade 4 x 5 power in left limbs. Seen ambulating steadily in the room by herself.  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 Workup has been completed. Continue management as outlined in discharge summary. Discussed with rehabilitation team and they will be accepting patient to CIR today. - Rest of medical problems and management as per detailed discharge summary dictated by Dr. Sheran Fava on 05/21/16.  Discussed with patient and spouse at bedside. Updated care and answered questions.  Elizabeth Leep, MD, FACP, FHM. [facility] Pager [phone]  If 7PM-7AM, please contact night-coverage [email or web address] Password [email or web address] 05/22/2016, 1:31 PM

## 2016-05-23 ENCOUNTER — Inpatient Hospital Stay (HOSPITAL_COMMUNITY): Payer: Medicare Other

## 2016-05-23 ENCOUNTER — Inpatient Hospital Stay (HOSPITAL_COMMUNITY): Payer: 59 | Admitting: Speech Pathology

## 2016-05-23 ENCOUNTER — Inpatient Hospital Stay (HOSPITAL_COMMUNITY): Payer: 59 | Admitting: Occupational Therapy

## 2016-05-23 DIAGNOSIS — G8194 Hemiplegia, unspecified affecting left nondominant side: Secondary | ICD-10-CM

## 2016-05-23 DIAGNOSIS — I6350 Cerebral infarction due to unspecified occlusion or stenosis of unspecified cerebral artery: Secondary | ICD-10-CM

## 2016-05-23 LAB — COMPREHENSIVE METABOLIC PANEL
ALBUMIN: 3.2 g/dL — AB (ref 3.5–5.0)
ALK PHOS: 77 U/L (ref 38–126)
ALT: 14 U/L (ref 14–54)
ANION GAP: 6 (ref 5–15)
AST: 21 U/L (ref 15–41)
BILIRUBIN TOTAL: 0.7 mg/dL (ref 0.3–1.2)
BUN: 9 mg/dL (ref 6–20)
CALCIUM: 9.3 mg/dL (ref 8.9–10.3)
CO2: 25 mmol/L (ref 22–32)
CREATININE: 0.86 mg/dL (ref 0.44–1.00)
Chloride: 108 mmol/L (ref 101–111)
GFR calc Af Amer: >60 mL/min (ref >60)
GFR calc non Af Amer: >60 mL/min (ref >60)
GLUCOSE: 100 mg/dL — AB (ref 65–99)
Potassium: 3.7 mmol/L (ref 3.5–5.1)
Sodium: 139 mmol/L (ref 135–145)
TOTAL PROTEIN: 5.6 g/dL — AB (ref 6.5–8.1)

## 2016-05-23 LAB — CBC WITH DIFFERENTIAL/PLATELET
BASOS PCT: 0 %
Basophils Absolute: 0 10*3/uL (ref 0.0–0.1)
Eosinophils Absolute: 0.1 10*3/uL (ref 0.0–0.7)
Eosinophils Relative: 1 %
HEMATOCRIT: 39.2 % (ref 36.0–46.0)
HEMOGLOBIN: 13 g/dL (ref 12.0–15.0)
LYMPHS ABS: 2.7 10*3/uL (ref 0.7–4.0)
Lymphocytes Relative: 39 %
MCH: 28.5 pg (ref 26.0–34.0)
MCHC: 33.2 g/dL (ref 30.0–36.0)
MCV: 86 fL (ref 78.0–100.0)
MONOS PCT: 8 %
Monocytes Absolute: 0.5 10*3/uL (ref 0.1–1.0)
NEUTROS ABS: 3.7 10*3/uL (ref 1.7–7.7)
NEUTROS PCT: 52 %
Platelets: 127 10*3/uL — ABNORMAL LOW (ref 150–400)
RBC: 4.56 MIL/uL (ref 3.87–5.11)
RDW: 14.4 % (ref 11.5–15.5)
WBC: 7.1 10*3/uL (ref 4.0–10.5)

## 2016-05-23 NOTE — Care Management Note (Signed)
[facility] Individual Statement of Services  Patient Name:  Elizabeth [name]  Date:  05/23/2016  Welcome to the [facility].  Our goal is to provide you with an individualized program based on your diagnosis and situation, designed to meet your specific needs.  With this comprehensive rehabilitation program, you will be expected to participate in at least 3 hours of rehabilitation therapies Monday-Friday, with modified therapy programming on the weekends.  Your rehabilitation program will include the following services:  Physical Therapy (PT), Occupational Therapy (OT), Speech Therapy (ST), 24 hour per day rehabilitation nursing, Therapeutic Recreaction (TR), Neuropsychology, Case Management (Social Worker), Rehabilitation Medicine, Nutrition Services and Pharmacy Services  Weekly team conferences will be held on Wednesday to discuss your progress.  Your Social Worker will talk with you frequently to get your input and to update you on team discussions.  Team conferences with you and your family in attendance may also be held.  Expected length of stay: 5-7 days  Overall anticipated outcome: mod/i level  Depending on your progress and recovery, your program may change. Your Social Worker will coordinate services and will keep you informed of any changes. Your Social Worker's name and contact numbers are listed  below.  The following services may also be recommended but are not provided by the [facility] will be made to provide these services after discharge if needed.  Arrangements include referral to agencies that provide these services.  Your insurance has been verified to be:  [place] Your primary doctor is:  Cathlean Cower  Pertinent information will be shared with your doctor and your insurance  company.  Social Worker:  Ovidio Kin, [place] or [phone]  Information discussed with and copy given to patient by: Elease Hashimoto, 05/23/2016, 9:31 AM

## 2016-05-23 NOTE — Evaluation (Signed)
Physical Therapy Assessment and Plan  Patient Details  Name: Elizabeth [name] MRN: [id number] Date of Birth: 22-May-1951  PT Diagnosis: Difficulty walking, Hemiparesis dominant and Muscle weakness Rehab Potential: Excellent ELOS: 5-7 days   Today's Date: 05/23/2016 PT Individual Time: 0800-0900 PT Individual Time Calculation (min): 60 min     Problem List: Patient Active Problem List   Diagnosis Date Noted  . 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 Facial droop   . Adjustment disorder with anxious mood   . Acute CVA (cerebrovascular accident) ([place]) 05/20/2016  . Slurred speech   . Dysarthria, post-stroke   . Sickle cell trait ([place])   . Acute embolic stroke ([place]) 85/88/5027  . Depression   . Cervical radiculitis 05/17/2013  . Right shoulder pain 11/16/2012  . Anxiety 04/02/2011  . Preventative health care 03/31/2011  . CAD (coronary artery disease)   . Benign essential HTN   . Hyperlipidemia   . Degenerative joint disease   . Tobacco abuse 12/22/2009    Past Medical History:  Past Medical History:  Diagnosis Date  . Abrasion or friction burn of foot and toe(s), with infection   . Allergy    soy  . Anxiety   . 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 Degenerative joint disease    Generalized  . Hyperlipidemia   . Hypertension   . Myocardial infarction ([place])   . Sickle cell trait ([place])   . Tobacco abuse April, 2011   Continued   Past Surgical History:  Past Surgical History:  Procedure Laterality Date  . ANGIOPLASTY  2006   x3 stents  . DILATION AND CURETTAGE OF UTERUS    . EP IMPLANTABLE DEVICE N/A 05/21/2016   Procedure: Loop Recorder Insertion;  Surgeon: Thompson Grayer, MD;  Location: [facility];  Service: Cardiovascular;  Laterality: N/A;  . FOOT SURGERY  2000  . LITHOTRIPSY  2008   left kidney stone and also ureteral stent placed  . TEE WITHOUT CARDIOVERSION  N/A 05/21/2016   Procedure: TRANSESOPHAGEAL ECHOCARDIOGRAM (TEE);  Surgeon: Lelon Perla, MD;  Location: [facility] ENDOS[facility];  Service: Cardiovascular;  Laterality: N/A;    Assessment & Plan Clinical Impression: Patient is a 65 y.o. year old right handed femalewith history of CAD status post MI 3 maintained on aspirin 81 mg, hypertension, sickle cell trait and tobacco abuse. Per chart review patient lives with spouse. Independent with a cane prior to admission. One level home with 3 steps to entry. Spouse works third shift. Reports there is a niece that can assist. Presented 05/19/2016 with left facial droop and slurred speech. By report patient has not seen her PCP since 2014 and ran out of her blood pressure medications 2 weeks prior to admission. CT/MRI showed several small acute early subacute infarcts within the right anterior paramedian superior pons, right posterior limb of internal capsule, right mid corona radiata and left parietal operculum. Small late subacute to chronic infarcts within the left splenium of corpus colostrum, left occipital lobe and right inferior pons. MRA of the head with no occlusion aneurysm or dissection without significant stenosis. CT angiogram of the neck no carotid bifurcation stenosis or dissection. Patient did not receive TPA. Underwent TEE 05/21/2016 showing normal LV systolic function. and placement of loop recorder 05/21/2016.. Currently maintained on aspirin for CVA prophylaxis. Subcutaneous Lovenox for DVT prophylaxis.Lower extremity Doppler studies negative for DVT. Tolerating a regular  consistency diet. Physical and occupational therapy evaluation completed with recommendations of physical medicine rehabilitation consult.Patient was admitted for a comprehensive rehabilitation program.  Patient transferred to CIR on 05/22/2016 .   Patient currently requires min with mobility secondary to muscle weakness, decreased cardiorespiratoy endurance, decreased attention to left  and decreased sitting balance, decreased standing balance and decreased balance strategies.  Prior to hospitalization, patient was independent  with mobility and lived with Spouse in a House home.  Home access is 2 and then 1 into houseStairs to enter.  Patient will benefit from skilled PT intervention to maximize safe functional mobility, minimize fall risk and decrease caregiver burden for planned discharge home with 24 hour supervision.  Anticipate patient will benefit from follow up OP at discharge.  PT Assessment Rehab Potential (ACUTE/IP ONLY): Excellent PT Patient demonstrates impairments in the following area(s): Balance;Endurance;Motor;Perception;Safety PT Transfers Functional Problem(s): Bed Mobility;Bed to Chair;Car;Furniture;Floor PT Locomotion Functional Problem(s): Ambulation;Stairs PT Plan PT Intensity: Minimum of 1-2 x/day ,45 to 90 minutes PT Frequency: 5 out of 7 days PT Duration Estimated Length of Stay: 5-7 days PT Treatment/Interventions: Training and development officer;Ambulation/gait training;Community reintegration;Discharge planning;Disease management/prevention;DME/adaptive equipment instruction;Functional mobility training;Neuromuscular re-education;Pain management;Patient/family education;Psychosocial support;Skin care/wound management;Splinting/orthotics;Stair training;Therapeutic Activities;Therapeutic Exercise;UE/LE Strength taining/ROM;UE/LE Coordination activities;Wheelchair propulsion/positioning;Visual/perceptual remediation/compensation PT Transfers Anticipated Outcome(s): mod I  PT Locomotion Anticipated Outcome(s): mod I household gait; supervision community gait PT Recommendation Follow Up Recommendations: Outpatient PT Patient destination: Home Equipment Recommended: To be determined  Skilled Therapeutic Intervention Individual treatment initiated and evaluation completed addressing orientation to rehab, PT POC, goals, functional mobility, balance, endurance,  safety, inattention, stairs, gait over uneven surfaces, and Nustep (x 5 min on level 6) for general strengthening and endurance. Overall pt required steadying assist throughout the session for balance and verbal cues for attention to L side throughout with pt demonstrating good carryover throughout session.  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; seamstress  Lives With: Spouse Prior Function Level of Independence: Independent with basic ADLs;Independent with homemaking with ambulation;Independent with gait  Able to Take Stairs?: Yes Driving: Yes 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 Vision/Perception  Perception Perception: Impaired Inattention/Neglect:  (mild L inattention)  Cognition Overall Cognitive Status: Impaired/Different from baseline Arousal/Alertness: Awake/alert Orientation Level: Oriented to person;Oriented to situation;Disoriented to time;Oriented to place Attention: Sustained;Selective Sustained Attention: Appears intact Selective Attention: Appears intact Memory: Impaired Memory Impairment: Retrieval deficit;Decreased recall of new information;Decreased short term memory Decreased Short Term Memory: Verbal basic;Functional basic Awareness: Impaired Awareness Impairment: Emergent impairment Problem Solving: Impaired Problem Solving Impairment: Verbal basic;Functional  basic Safety/Judgment: Impaired Sensation Sensation Light Touch: Appears Intact Coordination Gross Motor Movements are Fluid and Coordinated: Yes Motor  Motor Motor: Other (comment) (mild L hemiparesis) Motor - Skilled Clinical Observations: mild L sided weakness     Trunk/Postural Assessment  Cervical Assessment Cervical Assessment: Within Functional Limits Thoracic Assessment Thoracic Assessment: Within Functional Limits Lumbar Assessment Lumbar Assessment: Within Functional Limits Postural Control Postural Control:  (decreased balance reactions)  Balance Balance Balance Assessed: Yes Static Sitting Balance Static Sitting - Level of Assistance: 6: Modified independent (Device/Increase time)  Dynamic Sitting Balance Dynamic Sitting - Level of Assistance: 5: Stand by assistance Static Standing Balance Static Standing - Level of Assistance: 5: Stand by assistance Dynamic Standing Balance Dynamic Standing - Level of Assistance: 4: Min assist Extremity Assessment   see OT eval for UE details   RLE Assessment RLE Assessment: Within Functional Limits LLE Assessment LLE Assessment: Exceptions to [facility] (grossly 4/5)   See Function Navigator for Current Functional Status.   Refer to Care Plan for Long Term Goals  Recommendations for other services: None  Discharge Criteria: Patient will be discharged from PT if patient refuses treatment 3 consecutive times without medical reason, if treatment goals not met, if there is a change in medical status, if patient makes no progress towards goals or if patient is discharged from hospital.  The above assessment, treatment plan, treatment alternatives and goals were discussed and mutually agreed upon: by patient and by family  Juanna Cao, PT, DPT  05/23/2016, 1:27 PM

## 2016-05-23 NOTE — Progress Notes (Signed)
Social Work Assessment and Plan Social Work Assessment and Plan  Patient Details  Name: Elizabeth [name] MRN: [id number] Date of Birth: 1951-02-03  Today's Date: 05/23/2016  Problem List:  Patient Active Problem List   Diagnosis Date Noted  . 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 Facial droop   . Adjustment disorder with anxious mood   . Acute CVA (cerebrovascular accident) ([place]) 05/20/2016  . Slurred speech   . Dysarthria, post-stroke   . Sickle cell trait ([place])   . Acute embolic stroke ([place]) [id number]  . Depression   . Cervical radiculitis 05/17/2013  . Right shoulder pain 11/16/2012  . Anxiety 04/02/2011  . Preventative health care 03/31/2011  . CAD (coronary artery disease)   . Benign essential HTN   . Hyperlipidemia   . Degenerative joint disease   . Tobacco abuse 12/22/2009   Past Medical History:  Past Medical History:  Diagnosis Date  . Abrasion or friction burn of foot and toe(s), with infection   . Allergy    soy  . Anxiety   . 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 Degenerative joint disease    Generalized  . Hyperlipidemia   . Hypertension   . Myocardial infarction ([place])   . Sickle cell trait ([place])   . Tobacco abuse April, 2011   Continued   Past Surgical History:  Past Surgical History:  Procedure Laterality Date  . ANGIOPLASTY  2006   x3 stents  . DILATION AND CURETTAGE OF UTERUS    . EP IMPLANTABLE DEVICE N/A 05/21/2016   Procedure: Loop Recorder Insertion;  Surgeon: Thompson Grayer, MD;  Location: [facility];  Service: Cardiovascular;  Laterality: N/A;  . FOOT SURGERY  2000  . LITHOTRIPSY  2008   left kidney stone and also ureteral stent placed  . TEE WITHOUT CARDIOVERSION N/A 05/21/2016   Procedure: TRANSESOPHAGEAL ECHOCARDIOGRAM (TEE);  Surgeon: Lelon Perla, MD;  Location: [facility];  Service: Cardiovascular;  Laterality: N/A;   Social  History:  reports that she has been smoking Cigarettes.  She has never used smokeless tobacco. She reports that she does not drink alcohol or use drugs.  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 She and husband have no children of their own. They have church members and friends who are supportive.  Social History Preferred language: English Religion: Non-Denominational Cultural Background: No issues Education: High School Read: Yes Write: Yes Employment Status: Unemployed Freight forwarder Issues: No issues Guardian/Conservator: None-according to MD pt is capable of making her own decisions while here.   Abuse/Neglect Physical Abuse: Denies Verbal Abuse: Denies Sexual Abuse: Denies Exploitation of patient/patient's resources: Denies Self-Neglect: Denies  Emotional Status Pt's affect, behavior adn adjustment status: Pt is motivated to improve and recover from this stroke. She has always been one to take care of herself and plans to do this again. 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 Recent Psychosocial Issues: relatively healthy but has not seen a MD in 2 years Pyschiatric  History: history of anixety takes medications for this and feinds it helpful. Although she also doesn;t like to take medications, so she tires not too. She appears to be coping appropriately at this time and is verbal. Will monitor while here and see if needs to see neuro-psych while here. Substance  Abuse History: Tobacco-aware of the risks involved and will try to quit now. Will discuss resources while here  Patient / Family Perceptions, Expectations & Goals Pt/Family understanding of illness & functional limitations: Pt and husband have a good understanding of her stroke and deficits. They have spoken with the MD and feel they have a good understanding of her treatment plan and course. Premorbid pt/family roles/activities: Wife, Sister, International aid/development worker, Church member, etc Anticipated changes in roles/activities/participation: resume Pt/family expectations/goals: Pt states: " I want to be able to take care of myself like I always have."  Husband states: " I will do what she needs me too. She is always the one in charge."  [employer]: None Premorbid Home Care/DME Agencies: None Transportation available at discharge: Husband Resource referrals recommended: Support group (specify)  Discharge Planning Living Arrangements: Spouse/significant other Support Systems: Spouse/significant other, Other relatives, Water engineer, Social worker community Type of Residence: Private residence Insurance underwriter Resources: [employer], Multimedia programmer (specify) Sports administrator) Financial Resources: Family Support Financial Screen Referred: No Living Expenses: Own Money Management: Spouse, Patient Does the patient have any problems obtaining your medications?: No Home Management: Patient Patient/Family Preliminary Plans: Return home with husband who plans on tkaing time off from work to provide assist. They are checking with their niece to see if she can also assist.  Will await team evaluations and go from there. Social Work Anticipated Follow Up Needs: HH/OP, Support Group  Clinical Impression Pleasant female who has always ben independent and taken care of others, so this is a new role for her. She wants to regain as much of her independence as she can before leaving here. She misses her  granddaughter she  Babysits. husband plans to take FMLA for a short time when she comes home. Will await team's evulations and work on a safe discharge plan. Will also get her a PCP if her current MD will not see her since it has been too long. She knows she will need one at discharge for follow up.  Elease Hashimoto 05/23/2016, 9:45 AM

## 2016-05-23 NOTE — Progress Notes (Signed)
Physical Therapy Session Note  Patient Details  Name: Elizabeth [name] MRN: [id number] Date of Birth: 02-05-1951  Today's Date: 05/23/2016 PT Individual Time: 1500-1530 PT Individual Time Calculation (min): 30 min    Short Term Goals: Week 1:  PT Short Term Goal 1 (Week 1): = LTGs due to LOS  Skilled Therapeutic Interventions/Progress Updates:    Session focused on functional gait on unit with close supervision to occasional steady assist and neuro re-ed for balance assessment during Berg Balance Test (see results below). Discussed results with patient and reviewed fall risk. Pt demonstrating improved balance overall this PM during functional transfers.   Therapy Documentation Precautions:  Precautions Precautions: Fall Precaution Comments: L visual field deficits Restrictions Weight Bearing Restrictions: No  Pain: Pain Assessment Pain Assessment: No/denies pain    Balance: Standardized Balance Assessment Standardized Balance Assessment: Berg Balance Test Berg Balance Test Sit to Stand: Able to stand without using hands and stabilize independently Standing Unsupported: Able to stand 2 minutes with supervision Sitting with Back Unsupported but Feet Supported on Floor or Stool: Able to sit safely and securely 2 minutes Stand to Sit: Sits safely with minimal use of hands Transfers: Able to transfer safely, definite need of hands Standing Unsupported with Eyes Closed: Able to stand 10 seconds with supervision Standing Ubsupported with Feet Together: Able to place feet together independently and stand for 1 minute with supervision From Standing, Reach Forward with Outstretched Arm: Can reach confidently >25 cm (10") From Standing Position, Pick up Object from Floor: Able to pick up shoe, needs supervision From Standing Position, Turn to Look Behind Over each Shoulder: Looks behind from both sides and weight shifts well Turn 360 Degrees: Able to turn 360 degrees safely but  slowly Standing Unsupported, Alternately Place Feet on Step/Stool: Able to complete 4 steps without aid or supervision Standing Unsupported, One Foot in Front: Able to plae foot ahead of the other independently and hold 30 seconds Standing on One Leg: Able to lift leg independently and hold 5-10 seconds Total Score: 45   See Function Navigator for Current Functional Status.   Therapy/Group: Individual Therapy  Canary Brim Ivory Broad, PT, DPT  05/23/2016, 4:03 PM

## 2016-05-23 NOTE — H&P (View-Only) (Signed)
Physical Medicine and Rehabilitation Admission H&P    Chief Complaint  Patient presents with  . Stroke Symptoms  : HPI: Elizabeth [name] is a 65 y.o. 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 Per chart review patient lives with spouse. Independent with a cane prior to admission. One level home with 3 steps to entry. Spouse works third shift. Reports there is a niece that can assist. Presented 05/19/2016 with left facial droop and slurred speech. By report patient has not seen her PCP since 2014 and ran out of her blood pressure medications 2 weeks prior to admission. CT/MRI showed several small acute early subacute infarcts within the right anterior paramedian superior pons, right posterior limb of internal capsule, right mid corona radiata and left parietal operculum. Small late subacute to chronic infarcts within the left splenium of corpus colostrum, left occipital lobe and right inferior pons. MRA of the head with no occlusion aneurysm or dissection without significant stenosis. CT angiogram of the neck no carotid bifurcation stenosis or dissection. Patient did not receive TPA. Underwent TEE 05/21/2016 showing normal LV systolic function. and placement of loop recorder 05/21/2016.. Currently maintained on aspirin for CVA prophylaxis. Subcutaneous Lovenox for DVT prophylaxis.Lower extremity Doppler studies negative for DVT. Tolerating a regular consistency diet. Physical and occupational therapy evaluation completed with recommendations of physical medicine rehabilitation consult.Patient was admitted for a comprehensive rehabilitation program  ROS Constitutional: Negative for chills and fever.  HENT: Negative for hearing loss.   Eyes: Positive for blurred vision. Negative for double vision.  Respiratory: Negative for cough and shortness of breath.   Cardiovascular: Negative for chest pain.    Gastrointestinal: Negative for nausea and vomiting.  Genitourinary: Negative for dysuria and hematuria.  Musculoskeletal: Positive for joint pain and myalgias.  Skin: Negative for rash.  Neurological: Positive for weakness. Negative for sensory change, focal weakness, seizures and headaches.  Psychiatric/Behavioral:       Anxiety  All other systems reviewed and are negative   Past Medical History:  Diagnosis Date  . Abrasion or friction burn of foot and toe(s), with infection   . Allergy    soy  . Anxiety   . 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 Degenerative joint disease    Generalized  . Hyperlipidemia   . Hypertension   . Myocardial infarction ([place])   . Sickle cell trait ([place])   . Tobacco abuse April, 2011   Continued   Past Surgical History:  Procedure Laterality Date  . ANGIOPLASTY  2006   x3 stents  . DILATION AND CURETTAGE OF UTERUS    . FOOT SURGERY  2000  . LITHOTRIPSY  2008   left kidney stone and also ureteral stent placed   Family History  Problem Relation Age of Onset  . Coronary artery disease Mother   . Coronary artery disease Father    Social History:  reports that she has been smoking Cigarettes.  She has never used smokeless tobacco. She reports that she does not drink alcohol or use drugs. Allergies:  Allergies  Allergen Reactions  . Shrimp [Shellfish Allergy] Nausea And Vomiting  . Adhesive [Tape] Other (See Comments)    Unknown  . Soy Allergy Nausea And Vomiting   Medications Prior to Admission  Medication Sig Dispense Refill  . aspirin  81 MG EC tablet Take 81 mg by mouth 2 (two) times daily.      Marland Kitchen atorvastatin (LIPITOR) 80 MG tablet TAKE 1 TABLET (80 MG TOTAL) BY MOUTH DAILY. (Patient not taking: Reported on 05/19/2016) 90 tablet 3  . enalapril (VASOTEC) 10 MG tablet Take 1 tablet (10 mg total) by mouth 2 (two) times daily. (Patient not taking:  Reported on 05/19/2016) 60 tablet 11  . escitalopram (LEXAPRO) 10 MG tablet Take 1 tablet (10 mg total) by mouth daily. (Patient not taking: Reported on 05/19/2016) 90 tablet 3  . ezetimibe (ZETIA) 10 MG tablet Take 1 tablet (10 mg total) by mouth daily. (Patient not taking: Reported on 05/19/2016) 90 tablet 3  . irbesartan (AVAPRO) 300 MG tablet Take 1 tablet (300 mg total) by mouth daily. (Patient not taking: Reported on 05/19/2016) 90 tablet 3  . metoprolol (LOPRESSOR) 50 MG tablet Take 1 tablet (50 mg total) by mouth 2 (two) times daily. (Patient not taking: Reported on 05/19/2016) 180 tablet 3  . nitroGLYCERIN (NITROLINGUAL) 0.4 MG/SPRAY spray Place 1 spray under the tongue every 5 (five) minutes as needed. May repeat up to 3 doses. 12 g 3    Home: Home Living Family/patient expects to be discharged to:: Private residence Living Arrangements: Spouse/significant other Available Help at Discharge: Family, Available PRN/intermittently (spouse works 3rd shift, reports niece can come stay with her) Type of Home: House Home Access: Stairs to enter Technical brewer of Steps: 3 Entrance Stairs-Rails: Right [place]: One level Bathroom Shower/Tub: Tub/shower unit, Walk-in shower Home Equipment: Radio producer - single point Additional Comments: watches 2 yo granddaughter  Lives With: Spouse   Functional History: Prior Function Level of Independence: Independent Comments: used cane occasionally when L knee would hurt, watches kids  Functional Status:  Mobility: Bed Mobility Overal bed mobility: Modified Independent General bed mobility comments: used bed rail Transfers Overall transfer level: Needs assistance Equipment used: None Transfers: Sit to/from Stand Sit to Stand: Min guard General transfer comment: pt held onto rail to steady self, min guard to steady pt Ambulation/Gait Ambulation/Gait assistance: Min assist Ambulation Distance (Feet): 150 Feet Assistive device: None Gait  Pattern/deviations: Step-through pattern, Decreased stride length, Staggering left General Gait Details: pt consistently running into objects on the L without recognition or problem solving around the obstacle Gait velocity: guarded Gait velocity interpretation: Below normal speed for age/gender Stairs: Yes Stairs assistance: Min assist Stair Management: One rail Right Number of Stairs: 3 (x2) General stair comments: pt leaning to the L, minA to maintain balance without reaching for L rail (only used R HR to mimic home set up)    ADL: ADL Overall ADL's : Needs assistance/impaired Eating/Feeding: Set up, Sitting Eating/Feeding Details (indicate cue type and reason): cues for L side  Grooming: Wash/dry face, Oral care, Set up Toilet Transfer: Min Psychiatric nurse Details (indicate cue type and reason): pt reaching for environmental supports on L side. pt with limp to gait pattern with transfers General ADL Comments: Pt on arrival reports "there is a black spot in my vision over here" pt reports "its like a dirt I can see it but its like its dirty" pt with testing has greater deficits with white surfaces. Contrast on cell phone turned to more of a grey color to help with reading cell phone  Cognition: Cognition Overall Cognitive Status: Impaired/Different from baseline Arousal/Alertness: Awake/alert Orientation Level: Oriented X4 Attention: Sustained Sustained Attention: Impaired Sustained Attention Impairment: Verbal basic Memory: Impaired Memory Impairment: Retrieval deficit, Decreased  recall of new information Awareness: Impaired Awareness Impairment: Emergent impairment, Anticipatory impairment Problem Solving: Impaired Problem Solving Impairment: Verbal basic Safety/Judgment: Impaired Cognition Arousal/Alertness: Awake/alert Behavior During Therapy: WFL for tasks assessed/performed Overall Cognitive Status: Impaired/Different from baseline Area of Impairment:  Safety/judgement Safety/Judgement: Decreased awareness of deficits, Decreased awareness of safety General Comments: pt with L sided neglect  Physical Exam: Blood pressure (!) 145/85, pulse 65, temperature 98.3 F (36.8 C), temperature source Oral, resp. rate 16, height '5\' 3"'  (1.6 m), weight 61.2 kg (134 lb 14.7 oz), SpO2 97 %. Physical Exam Constitutional: She is oriented to person, place, and time. She appears well-developed and well-nourished.  HENT:  Head: Normocephalic and atraumatic.  Eyes: EOM are normal.  Pupils reactive to light  Neck: Normal range of motion. Neck supple. No thyromegaly present.  Cardiovascular: Normal rate and regular rhythm.   Respiratory: Effort normal and breath sounds normal. No respiratory distress.  GI: Soft. Bowel sounds are normal. She exhibits no distension.  Musculoskeletal: She exhibits no edema or tenderness.  Neurological: She is alert and oriented to person, place, and time.  Follows three step commands.  Speech is dysarthric but intelligible Sensation intact to light touch DTRs brisk b/l LE Motor: 4+-5/5 throughout No ataxia, dysmetria.  Left facial weakness Skin: Skin is warm and dry.  Psychiatric: She has a normal mood and affect. Her behavior is normal   Results for orders placed or performed during the hospital encounter of 05/19/16 (from the past 48 hour(s))  Protime-INR     Status: None   Collection Time: 05/19/16  2:44 PM  Result Value Ref Range   Prothrombin Time 12.6 11.4 - 15.2 seconds   INR 0.95   APTT     Status: None   Collection Time: 05/19/16  2:44 PM  Result Value Ref Range   aPTT 29 24 - 36 seconds  CBC     Status: None   Collection Time: 05/19/16  2:44 PM  Result Value Ref Range   WBC 9.3 4.0 - 10.5 K/uL   RBC 4.93 3.87 - 5.11 MIL/uL   Hemoglobin 14.6 12.0 - 15.0 g/dL   HCT 42.6 36.0 - 46.0 %   MCV 86.4 78.0 - 100.0 fL   MCH 29.6 26.0 - 34.0 pg   MCHC 34.3 30.0 - 36.0 g/dL   RDW 14.5 11.5 - 15.5 %    Platelets 157 150 - 400 K/uL  Differential     Status: None   Collection Time: 05/19/16  2:44 PM  Result Value Ref Range   Neutrophils Relative % 52 %   Neutro Abs 4.8 1.7 - 7.7 K/uL   Lymphocytes Relative 39 %   Lymphs Abs 3.6 0.7 - 4.0 K/uL   Monocytes Relative 7 %   Monocytes Absolute 0.7 0.1 - 1.0 K/uL   Eosinophils Relative 1 %   Eosinophils Absolute 0.1 0.0 - 0.7 K/uL   Basophils Relative 1 %   Basophils Absolute 0.1 0.0 - 0.1 K/uL  Comprehensive metabolic panel     Status: Abnormal   Collection Time: 05/19/16  2:44 PM  Result Value Ref Range   Sodium 136 135 - 145 mmol/L   Potassium 3.9 3.5 - 5.1 mmol/L   Chloride 108 101 - 111 mmol/L   CO2 25 22 - 32 mmol/L   Glucose, Bld 111 (H) 65 - 99 mg/dL   BUN 9 6 - 20 mg/dL   Creatinine, Ser 0.83 0.44 - 1.00 mg/dL   Calcium 10.0 8.9 - 10.3  mg/dL   Total Protein 6.6 6.5 - 8.1 g/dL   Albumin 3.7 3.5 - 5.0 g/dL   AST 18 15 - 41 U/L   ALT 11 (L) 14 - 54 U/L   Alkaline Phosphatase 101 38 - 126 U/L   Total Bilirubin 0.5 0.3 - 1.2 mg/dL   GFR calc non Af Amer >60 >60 mL/min   GFR calc Af Amer >60 >60 mL/min    Comment: (NOTE) The eGFR has been calculated using the CKD EPI equation. This calculation has not been validated in all clinical situations. eGFR's persistently <60 mL/min signify possible Chronic Kidney Disease.    Anion gap 3 (L) 5 - 15  I-stat troponin, ED     Status: None   Collection Time: 05/19/16  4:07 PM  Result Value Ref Range   Troponin i, poc 0.01 0.00 - 0.08 ng/mL   Comment 3            Comment: Due to the release kinetics of cTnI, a negative result within the first hours of the onset of symptoms does not rule out myocardial infarction with certainty. If myocardial infarction is still suspected, repeat the test at appropriate intervals.   I-Stat Chem 8, ED     Status: Abnormal   Collection Time: 05/19/16  4:10 PM  Result Value Ref Range   Sodium 139 135 - 145 mmol/L   Potassium 4.1 3.5 - 5.1 mmol/L    Chloride 103 101 - 111 mmol/L   BUN 9 6 - 20 mg/dL   Creatinine, Ser 0.70 0.44 - 1.00 mg/dL   Glucose, Bld 93 65 - 99 mg/dL   Calcium, Ion 1.26 (H) 1.12 - 1.23 mmol/L   TCO2 27 0 - 100 mmol/L   Hemoglobin 16.0 (H) 12.0 - 15.0 g/dL   HCT 47.0 (H) 36.0 - 46.0 %  Hemoglobin A1c     Status: None   Collection Time: 05/20/16  4:17 AM  Result Value Ref Range   Hgb A1c MFr Bld 5.5 4.8 - 5.6 %    Comment: (NOTE)         Pre-diabetes: 5.7 - 6.4         Diabetes: >6.4         Glycemic control for adults with diabetes: <7.0    Mean Plasma Glucose 111 mg/dL    Comment: (NOTE) Performed At: [facility] [place], [place] [id number] Lindon Romp MD AL:[id number]   Lipid panel     Status: Abnormal   Collection Time: 05/20/16  4:17 AM  Result Value Ref Range   Cholesterol 248 (H) 0 - 200 mg/dL   Triglycerides 145 <150 mg/dL   HDL 54 >40 mg/dL   Total CHOL/HDL Ratio 4.6 RATIO   VLDL 29 0 - 40 mg/dL   LDL Cholesterol 165 (H) 0 - 99 mg/dL    Comment:        Total Cholesterol/HDL:CHD Risk Coronary Heart Disease Risk Table                     Men   Women  1/2 Average Risk   3.4   3.3  Average Risk       5.0   4.4  2 X Average Risk   9.6   7.1  3 X Average Risk  23.4   11.0        Use the calculated Patient Ratio above and the CHD Risk Table to determine the patient's CHD Risk.  ATP III CLASSIFICATION (LDL):  <100     mg/dL   Optimal  100-129  mg/dL   Near or Above                    Optimal  130-159  mg/dL   Borderline  160-189  mg/dL   High  >190     mg/dL   Very High   Urine rapid drug screen (hosp performed)     Status: None   Collection Time: 05/20/16  8:18 AM  Result Value Ref Range   Opiates NONE DETECTED NONE DETECTED   Cocaine NONE DETECTED NONE DETECTED   Benzodiazepines NONE DETECTED NONE DETECTED   Amphetamines NONE DETECTED NONE DETECTED   Tetrahydrocannabinol NONE DETECTED NONE DETECTED   Barbiturates NONE DETECTED NONE DETECTED     Comment:        DRUG SCREEN FOR MEDICAL PURPOSES ONLY.  IF CONFIRMATION IS NEEDED FOR ANY PURPOSE, NOTIFY LAB WITHIN 5 DAYS.        LOWEST DETECTABLE LIMITS FOR URINE DRUG SCREEN Drug Class       Cutoff (ng/mL) Amphetamine      1000 Barbiturate      200 Benzodiazepine   834 Tricyclics       196 Opiates          300 Cocaine          300 THC              50    Ct Head Wo Contrast  Result Date: 05/19/2016 CLINICAL DATA:  Left-sided facial droop and weakness. Slight slurred speech. EXAM: CT HEAD WITHOUT CONTRAST TECHNIQUE: Contiguous axial images were obtained from the base of the skull through the vertex without intravenous contrast. COMPARISON:  None. FINDINGS: Brain: No evidence of acute hemorrhage, hydrocephalus, extra-axial collection or mass lesion/mass effect. There is a small area of hypoattenuation in the deep white matter of the right frontal lobe, bordering the right lateral ventricle, likely representing an age-indeterminate ischemic infarct. Vascular: No hyperdense vessel or unexpected calcification. Skull: No acute fractures. Sinuses/Orbits: No acute finding. Other: None. IMPRESSION: Small area of hypoattenuation in the deep white matter of the right frontal lobe, which likely represents an age-indeterminate ischemic infarct. Alternatively this may represent an area of demyelination. Mild brain parenchymal atrophy. These results were called by telephone at the time of interpretation on 05/19/2016 at 3:46 pm to Dr. Audie Pinto, who verbally acknowledged these results. Electronically Signed   By: Fidela Salisbury M.D.   On: 05/19/2016 15:51   Ct Angio Neck W Or Wo Contrast  Result Date: 05/20/2016 CLINICAL DATA:  Multiple acute infarcts, with symptoms of LEFT facial droop and slurred speech new onset 05/19/2016. EXAM: CT ANGIOGRAPHY NECK TECHNIQUE: Multidetector CT imaging of the neck was performed using the standard protocol during bolus administration of intravenous contrast.  Multiplanar CT image reconstructions and MIPs were obtained to evaluate the vascular anatomy. Carotid stenosis measurements (when applicable) are obtained utilizing NASCET criteria, using the distal internal carotid diameter as the denominator. CONTRAST:  Isovue 370, 50 mL. COMPARISON:  MRI brain and MRA intracranial 05/19/2016. FINDINGS: Aortic arch: Standard branching. Imaged portion shows no evidence of aneurysm or dissection. No significant stenosis of the major arch vessel origins. Mild non stenotic calcific plaque at the origin of the innominate, and LEFT common carotid arteries. Calcific and noncalcific plaque at the origin of LEFT subclavian, estimated 50% stenosis. Right carotid system: Minor calcific plaque. Dolichoectatic cervical ICA. No evidence of dissection,  stenosis (50% or greater) or occlusion. Left carotid system: Incidental nonocclusive and non stenotic web, LEFT ICA origin No evidence of dissection, stenosis (50% or greater) or occlusion. Vertebral arteries: Codominant. No evidence of dissection, stenosis (50% or greater) or occlusion. Skeleton: Cervical spondylosis with significant disc space narrowing at C5-6 and C6-7. The patient is edentulous. Other neck: No neck masses. Upper chest: No lung apex lesion.  Minor changes of COPD. IMPRESSION: No carotid bifurcation stenosis or dissection. Minor atheromatous change at the great vessel origins of the innominate and LEFT common carotid artery, with calcific and noncalcific plaque resulting in 50% stenosis LEFT subclavian origin. Electronically Signed   By: Staci Righter M.D.   On: 05/20/2016 09:53   Mr Brain Wo Contrast  Result Date: 05/19/2016 CLINICAL DATA:  65 y/o  F; left facial droop and slurred speech. EXAM: MRI HEAD WITHOUT CONTRAST MRA HEAD WITHOUT CONTRAST TECHNIQUE: Multiplanar, multiecho pulse sequences of the brain and surrounding structures were obtained without intravenous contrast. Angiographic images of the head were obtained  using MRA technique without contrast. COMPARISON:  05/19/2016 CT head. FINDINGS: MRI HEAD FINDINGS Brain: There are small foci of diffusion restriction within the right anterior paramedian superior pons, the right posterior limb of internal capsule, the right mid corona radiata, and the left parietal operculum consistent with acute infarction. There additional foci of diffusion hyperintensity without low ADC in the left splenium of corpus callosum and left occipital lobe which may represent late subacute/chronic area of ischemia. Chronic lacunar infarct within the right inferior pons and probable small old infarct in the right posterior medial frontal lobe. No abnormal susceptibility hypointensity to indicate intracranial hemorrhage. Extra-axial space: No hydrocephalus. No extra-axial collection is identified. Cavum septum pellucidum. Other: Mild maxillary sinus mucosal thickening. No abnormal signal of the mastoid air cells. Orbits are unremarkable. Calvarium is unremarkable. 13 mm Thornwaldt cyst. MRA HEAD FINDINGS Internal carotid arteries: Patent. Anterior cerebral arteries: Patent. Middle cerebral arteries: Patent. Anterior communicating artery: Patent. Posterior communicating arteries: Possible diminutive right. No left identified, hypoplastic or absent. Posterior cerebral arteries: Patent. Basilar artery: Patent. Vertebral arteries: Patent. No evidence of high-grade stenosis, large vessel occlusion, or aneurysm unless noted above. IMPRESSION: 1. Several small acute/early subacute infarctions within the right anterior paramedian superior pons, right posterior limb of internal capsule, right mid corona radiata, and left parietal operculum. Symptoms of left-sided facial droop and slurred speech are probably attributable to the infarction in right posterior limb of internal capsule. 2. Small late subacute to chronic infarcts within the left splenium of corpus callosum, left occipital lobe, and right inferior  pons. 3. No acute intracranial hemorrhage. 4. Mild paranasal sinus disease. 5. No occlusion, aneurysm, dissection, or significant stenosis of the circle of Willis is identified. Electronically Signed   By: Kristine Garbe M.D.   On: 05/19/2016 21:46   Mr Jodene Nam Head/brain QI Cm  Result Date: 05/19/2016 CLINICAL DATA:  65 y/o  F; left facial droop and slurred speech. EXAM: MRI HEAD WITHOUT CONTRAST MRA HEAD WITHOUT CONTRAST TECHNIQUE: Multiplanar, multiecho pulse sequences of the brain and surrounding structures were obtained without intravenous contrast. Angiographic images of the head were obtained using MRA technique without contrast. COMPARISON:  05/19/2016 CT head. FINDINGS: MRI HEAD FINDINGS Brain: There are small foci of diffusion restriction within the right anterior paramedian superior pons, the right posterior limb of internal capsule, the right mid corona radiata, and the left parietal operculum consistent with acute infarction. There additional foci of diffusion hyperintensity without low ADC in  the left splenium of corpus callosum and left occipital lobe which may represent late subacute/chronic area of ischemia. Chronic lacunar infarct within the right inferior pons and probable small old infarct in the right posterior medial frontal lobe. No abnormal susceptibility hypointensity to indicate intracranial hemorrhage. Extra-axial space: No hydrocephalus. No extra-axial collection is identified. Cavum septum pellucidum. Other: Mild maxillary sinus mucosal thickening. No abnormal signal of the mastoid air cells. Orbits are unremarkable. Calvarium is unremarkable. 13 mm Thornwaldt cyst. MRA HEAD FINDINGS Internal carotid arteries: Patent. Anterior cerebral arteries: Patent. Middle cerebral arteries: Patent. Anterior communicating artery: Patent. Posterior communicating arteries: Possible diminutive right. No left identified, hypoplastic or absent. Posterior cerebral arteries: Patent. Basilar  artery: Patent. Vertebral arteries: Patent. No evidence of high-grade stenosis, large vessel occlusion, or aneurysm unless noted above. IMPRESSION: 1. Several small acute/early subacute infarctions within the right anterior paramedian superior pons, right posterior limb of internal capsule, right mid corona radiata, and left parietal operculum. Symptoms of left-sided facial droop and slurred speech are probably attributable to the infarction in right posterior limb of internal capsule. 2. Small late subacute to chronic infarcts within the left splenium of corpus callosum, left occipital lobe, and right inferior pons. 3. No acute intracranial hemorrhage. 4. Mild paranasal sinus disease. 5. No occlusion, aneurysm, dissection, or significant stenosis of the circle of Willis is identified. Electronically Signed   By: Kristine Garbe M.D.   On: 05/19/2016 21:46    Medical Problem List and Plan: 1.  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 Status post loop recorder 2.  DVT Prophylaxis/Anticoagulation: Subcutaneous Lovenox. Monitor platelet counts and any signs of bleeding 3. Pain Management: Tylenol as needed 4. Mood: Lexapro 10 mg daily 5. Neuropsych: This patient is capable of making decisions on her own behalf. 6. Skin/Wound Care: Routine skin checks 7. Fluids/Electrolytes/Nutrition: Routine I&O with follow-up chemistries 8. Hypertension. Norvasc 5 mg daily,. Monitor with increased mobility 9. Hyperlipidemia. Zetia 10 mg daily, Lipitor 80 mg daily 10. CAD with history of MI. Continue aspirin. 11. Tobacco abuse. Counseling 12. Sickle cell trait: Monitor for crisis  Post Admission Physician Evaluation: 1. Functional deficits secondary  to acute/early subacute infarct right anterior paramedian superior pons, posterior limb, right mid corona radiata  and left parietal operculum. 2. Patient is admitted to receive collaborative, interdisciplinary care between the physiatrist, rehab nursing staff, and therapy team. 3. Patient's level of medical complexity and substantial therapy needs in context of that medical necessity cannot be provided at a lesser intensity of care such as a SNF. 4. Patient has experienced substantial functional loss from his/her baseline which was documented above under the "Functional History" and "Functional Status" headings.  Judging by the patient's diagnosis, physical exam, and functional history, the patient has potential for functional progress which will result in measurable gains while on inpatient rehab.  These gains will be of substantial and practical use upon discharge  in facilitating mobility and self-care at the household level. 5. Physiatrist will provide 24 hour management of medical needs as well as oversight of the therapy plan/treatment and provide guidance as appropriate regarding the interaction of the two. 6. 24 hour rehab nursing will assist with  safety, disease management and patient education and help integrate therapy concepts, techniques,education, etc. 7. PT will assess and treat for/with: Lower extremity strength, range of motion, stamina, balance, functional mobility, safety, adaptive techniques and equipment, woundcare,  coping skills, pain control, education.   Goals are: Mod I. 8. OT will assess and treat for/with: ADL's, functional mobility, safety, upper extremity strength, adaptive techniques and equipment, wound mgt, ego support, and community reintegration.   Goals are: Mod I. Therapy may proceed with showering this patient. 9. Case Management and Social Worker will assess and treat for psychological issues and discharge planning. 10. Team conference will be held weekly to assess progress toward goals and to determine barriers to discharge. 11. Patient will receive at least 3 hours of therapy  per day at least 5 days per week. 12. ELOS: 7-10 days.       13. Prognosis:  good  Delice Lesch, MD 05/21/2016

## 2016-05-23 NOTE — Progress Notes (Deleted)
65 y.o.right handed femalewith history of CAD status post MI 3 maintained on aspirin 81 mg, hypertension, sickle cell trait and tobacco abuse. Per chart review patient lives with spouse. Independent with a cane prior to admission. One level home with 3 steps to entry. Spouse works third shift. Reports there is a niece that can assist. Presented 05/19/2016 with left facial droop and slurred speech. By report patient has not seen her PCP since 2014 and ran out of her blood pressure medications 2 weeks prior to admission. CT/MRI showed several small acute early subacute infarcts within the right anterior paramedian superior pons, right posterior limb of internal capsule, right mid corona radiata and left parietal operculum. Small late subacute to chronic infarcts within the left splenium of corpus colostrum, left occipital lobe and right inferior pons. MRA of the head with no occlusion aneurysm or dissection without significant stenosis. CT angiogram of the neck no carotid bifurcation stenosis or dissection. Patient did not receive TPA. Underwent TEE 05/21/2016 showing normal LV systolic function. and placement of loop recorder 05/21/2016  Subjective/Complaints:   Objective: Vital Signs: Blood pressure 136/75, pulse (!) 53, temperature 98.3 F (36.8 C), temperature source Oral, resp. rate 18, height 5' 5" (1.651 m), weight 59.3 kg (130 lb 11.2 oz), SpO2 98 %. No results found. Results for orders placed or performed during the hospital encounter of 05/22/16 (from the past 72 hour(s))  CBC     Status: Abnormal   Collection Time: 05/22/16  6:13 PM  Result Value Ref Range   WBC 7.1 4.0 - 10.5 K/uL   RBC 4.71 3.87 - 5.11 MIL/uL   Hemoglobin 13.6 12.0 - 15.0 g/dL   HCT 40.7 36.0 - 46.0 %   MCV 86.4 78.0 - 100.0 fL   MCH 28.9 26.0 - 34.0 pg   MCHC 33.4 30.0 - 36.0 g/dL   RDW 14.0 11.5 - 15.5 %   Platelets 145 (L) 150 - 400 K/uL  Creatinine, serum     Status: None   Collection Time: 05/22/16  6:13 PM   Result Value Ref Range   Creatinine, Ser 0.68 0.44 - 1.00 mg/dL   GFR calc non Af Amer >60 >60 mL/min   GFR calc Af Amer >60 >60 mL/min    Comment: (NOTE) The eGFR has been calculated using the CKD EPI equation. This calculation has not been validated in all clinical situations. eGFR's persistently <60 mL/min signify possible Chronic Kidney Disease.   CBC WITH DIFFERENTIAL     Status: Abnormal   Collection Time: 05/23/16  4:35 AM  Result Value Ref Range   WBC 7.1 4.0 - 10.5 K/uL   RBC 4.56 3.87 - 5.11 MIL/uL   Hemoglobin 13.0 12.0 - 15.0 g/dL   HCT 39.2 36.0 - 46.0 %   MCV 86.0 78.0 - 100.0 fL   MCH 28.5 26.0 - 34.0 pg   MCHC 33.2 30.0 - 36.0 g/dL   RDW 14.4 11.5 - 15.5 %   Platelets 127 (L) 150 - 400 K/uL   Neutrophils Relative % 52 %   Neutro Abs 3.7 1.7 - 7.7 K/uL   Lymphocytes Relative 39 %   Lymphs Abs 2.7 0.7 - 4.0 K/uL   Monocytes Relative 8 %   Monocytes Absolute 0.5 0.1 - 1.0 K/uL   Eosinophils Relative 1 %   Eosinophils Absolute 0.1 0.0 - 0.7 K/uL   Basophils Relative 0 %   Basophils Absolute 0.0 0.0 - 0.1 K/uL  Comprehensive metabolic panel  Status: Abnormal   Collection Time: 05/23/16  4:35 AM  Result Value Ref Range   Sodium 139 135 - 145 mmol/L   Potassium 3.7 3.5 - 5.1 mmol/L   Chloride 108 101 - 111 mmol/L   CO2 25 22 - 32 mmol/L   Glucose, Bld 100 (H) 65 - 99 mg/dL   BUN 9 6 - 20 mg/dL   Creatinine, Ser 0.86 0.44 - 1.00 mg/dL   Calcium 9.3 8.9 - 10.3 mg/dL   Total Protein 5.6 (L) 6.5 - 8.1 g/dL   Albumin 3.2 (L) 3.5 - 5.0 g/dL   AST 21 15 - 41 U/L   ALT 14 14 - 54 U/L   Alkaline Phosphatase 77 38 - 126 U/L   Total Bilirubin 0.7 0.3 - 1.2 mg/dL   GFR calc non Af Amer >60 >60 mL/min   GFR calc Af Amer >60 >60 mL/min    Comment: (NOTE) The eGFR has been calculated using the CKD EPI equation. This calculation has not been validated in all clinical situations. eGFR's persistently <60 mL/min signify possible Chronic Kidney Disease.    Anion gap  6 5 - 15     HEENT: normal Cardio: RRR and No murmur Resp: CTA B/L and Unlabored GI: BS positive and Nontender, nondistended Extremity:  Pulses positive and No Edema Skin:   Intact Neuro: Alert/Oriented, Normal Sensory, Abnormal Motor 4/5 in the left deltoid, biceps, triceps, grip, hip flexor, knee extensor, ankle dorsal flexor and Other Visual fields are intact to confrontation testing Musc/Skel:  Normal Gen. no acute distress   Assessment/Plan: 1. 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 which require 3+ hours per day of interdisciplinary therapy in a comprehensive inpatient rehab setting. Physiatrist is providing close team supervision and 24 hour management of active medical problems listed below. Physiatrist and rehab team continue to assess barriers to discharge/monitor patient progress toward functional and medical goals. FIM: Function - Bathing Position: Shower (at sit > stand level) Body parts bathed by patient: Right arm, Left arm, Chest, Abdomen, Front perineal area, Buttocks, Right upper leg, Left upper leg, Right lower leg, Left lower leg, Back Assist Level: Supervision or verbal cues  Function- Upper Body Dressing/Undressing What is the patient wearing?: Bra, Pull over shirt/dress, Button up shirt Bra - Perfomed by patient: Thread/unthread right bra strap, Thread/unthread left bra strap, Hook/unhook bra (pull down sports bra) Pull over shirt/dress - Perfomed by patient: Thread/unthread right sleeve, Put head through opening, Thread/unthread left sleeve, Pull shirt over trunk Button up shirt - Perfomed by patient: Thread/unthread right sleeve, Thread/unthread left sleeve, Pull shirt around back Assist Level: Supervision or verbal cues Function - Lower Body Dressing/Undressing What is the patient  wearing?: Pants, Socks, Shoes Position:  (recliner) Pants- Performed by patient: Thread/unthread right pants leg, Thread/unthread left pants leg, Pull pants up/down Socks - Performed by patient: Don/doff right sock, Don/doff left sock Shoes - Performed by patient: Don/doff right shoe, Don/doff left shoe Assist for footwear: Supervision/touching assist Assist for lower body dressing: Supervision or verbal cues  Function - Toileting Toileting steps completed by patient: Adjust clothing prior to toileting, Performs perineal hygiene, Adjust clothing after toileting Toileting Assistive Devices: Grab bar or rail Assist level: Supervision or verbal cues  Function - Air cabin crew transfer assistive device: Grab bar Assist level to toilet: Touching or steadying assistance (Pt > 75%) Assist level  from toilet: Touching or steadying assistance (Pt > 75%)  Function - Chair/bed transfer Chair/bed transfer assist level: Touching or steadying assistance (Pt > 75%)  Function - Locomotion: Wheelchair Will patient use wheelchair at discharge?: No Function - Locomotion: Ambulation Assistive device: No device Max distance: 200' Assist level: Touching or steadying assistance (Pt > 75%) Assist level: Supervision or verbal cues Assist level: Touching or steadying assistance (Pt > 75%) Assist level: Touching or steadying assistance (Pt > 75%) Assist level: Touching or steadying assistance (Pt > 75%)  Function - Comprehension Comprehension: Auditory Comprehension assist level: Understands basic 90% of the time/cues < 10% of the time  Function - Expression Expression: Verbal Expression assist level: Expresses complex 90% of the time/cues < 10% of the time  Function - Social Interaction Social Interaction assist level: Interacts appropriately 90% of the time - Needs monitoring or encouragement for participation or interaction.  Function - Problem Solving Problem solving assist level: Solves  basic 75 - 89% of the time/requires cueing 10 - 24% of the time  Function - Memory Memory assist level: Recognizes or recalls 75 - 89% of the time/requires cueing 10 - 24% of the time Patient normally able to recall (first 3 days only): That he or she is in a hospital, Current season  Medical Problem List and Plan: 1.  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 Initiate CIR level PT, OT, speech 2.  DVT Prophylaxis/Anticoagulation: Subcutaneous Lovenox. Monitor platelet counts and any signs of bleeding 3. Pain Management: Tylenol as needed 4. Mood: Lexapro 10 mg daily 5. Neuropsych: This patient is capable of making decisions on her own behalf. 6. Skin/Wound Care: Routine skin checks 7. Fluids/Electrolytes/Nutrition: Routine I&O with follow-up chemistries 8. Hypertension. Norvasc 5 mg daily,. Monitor with increased mobility Vitals:   05/23/16 0453 05/23/16 1322  BP: 139/62 136/75  Pulse: (!) 56 (!) 53  Resp: 18 18  Temp: 98.5 F (36.9 C) 98.3 F (36.8 C)   9. Hyperlipidemia. Zetia 10 mg daily, Lipitor 80 mg daily 10. CAD with history of MI. Continue aspirin. 11. Tobacco abuse. Counseling 12. Sickle cell trait: Monitor for crisis 13. 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 Question whether patient is in and out of A. fib, loop recorder placed, follow-up as an outpatient for monitoring  LOS (Days) 1 A FACE TO [place] E 05/23/2016, 3:08 PM

## 2016-05-23 NOTE — Interval H&P Note (Signed)
Elizabeth [name] was admitted yesterday to Inpatient Rehabilitation with the diagnosis of right anterior paramedian pontine, right posterior limb internal capsule, right mid corona radiata and left parietal operculum infarct.  The patient's history has been reviewed, patient examined, and there is no change in status.  Patient continues to be appropriate for intensive inpatient rehabilitation.  I have reviewed the patient's chart and labs.  Questions were answered to the patient's satisfaction. The PAPE has been reviewed and assessment remains appropriate.  Cipriana Biller Lorie Phenix 05/23/2016, 7:36 AM

## 2016-05-23 NOTE — Evaluation (Signed)
Occupational Therapy Assessment and Plan  Patient Details  Name: Elizabeth [name] MRN: [id number] Date of Birth: September 01, 1951  OT Diagnosis: ataxia, cognitive deficits, disturbance of vision, hemiplegia affecting dominant side and muscle weakness (generalized) Rehab Potential: Rehab Potential (ACUTE ONLY): Good ELOS: 5-7 days   Today's Date: 05/23/2016 OT Individual Time: 9735-3299 OT Individual Time Calculation (min): 60 min      Problem List: Patient Active Problem List   Diagnosis Date Noted  . 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 Facial droop   . Adjustment disorder with anxious mood   . Acute CVA (cerebrovascular accident) ([place]) 05/20/2016  . Slurred speech   . Dysarthria, post-stroke   . Sickle cell trait ([place])   . Acute embolic stroke ([place]) 24/26/8341  . Depression   . Cervical radiculitis 05/17/2013  . Right shoulder pain 11/16/2012  . Anxiety 04/02/2011  . Preventative health care 03/31/2011  . CAD (coronary artery disease)   . Benign essential HTN   . Hyperlipidemia   . Degenerative joint disease   . Tobacco abuse 12/22/2009    Past Medical History:  Past Medical History:  Diagnosis Date  . Abrasion or friction burn of foot and toe(s), with infection   . Allergy    soy  . Anxiety   . 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 Degenerative joint disease    Generalized  . Hyperlipidemia   . Hypertension   . Myocardial infarction ([place])   . Sickle cell trait ([place])   . Tobacco abuse April, 2011   Continued   Past Surgical History:  Past Surgical History:  Procedure Laterality Date  . ANGIOPLASTY  2006   x3 stents  . DILATION AND CURETTAGE OF UTERUS    . EP IMPLANTABLE DEVICE N/A 05/21/2016   Procedure: Loop Recorder Insertion;  Surgeon: Thompson Grayer, MD;  Location: [facility];  Service: Cardiovascular;  Laterality: N/A;  . FOOT SURGERY  2000  . LITHOTRIPSY   2008   left kidney stone and also ureteral stent placed  . TEE WITHOUT CARDIOVERSION N/A 05/21/2016   Procedure: TRANSESOPHAGEAL ECHOCARDIOGRAM (TEE);  Surgeon: Lelon Perla, MD;  [facility];  Service: Cardiovascular;  Laterality: N/A;    Assessment & Plan Clinical Impression: Patient is a 65 y.o. left handed femalewith history of CAD status post MI 3 maintained on aspirin 81 mg, hypertension, sickle cell trait and tobacco abuse. Per chart review patient lives with spouse. Independent with a cane prior to admission. One level home with 3 steps to entry. Spouse works third shift. Reports there is a niece that can assist. Presented 05/19/2016 with left facial droop and slurred speech. By report patient has not seen her PCP since 2014 and ran out of her blood pressure medications 2 weeks prior to admission. CT/MRI showed several small acute early subacute infarcts within the right anterior paramedian superior pons, right posterior limb of internal capsule, right mid corona radiata and left parietal operculum. Small late subacute to chronic infarcts within the left splenium of corpus colostrum, left occipital lobe and right inferior pons. MRA of the head with no occlusion aneurysm or dissection without significant stenosis. CT angiogram of the neck no carotid bifurcation stenosis or dissection. Patient did not receive TPA. Underwent TEE 05/21/2016 showing normal LV systolic function. and placement of loop recorder 05/21/2016.. Currently maintained on aspirin for CVA prophylaxis. Subcutaneous Lovenox for DVT  prophylaxis.Lower extremity Doppler studies negative for DVT. Tolerating a regular consistency diet. Physical and occupational therapy evaluation completed with recommendations of physical medicine rehabilitation consult.   Patient transferred to CIR on 05/22/2016 .    Patient currently requires min with basic self-care skills and IADL secondary to muscle weakness, impaired timing and  sequencing and decreased coordination, decreased visual perceptual skills, decreased attention to left, decreased awareness, decreased problem solving, decreased safety awareness and decreased memory and decreased standing balance and decreased balance strategies.  Prior to hospitalization, patient could complete ADLs and IADLs with independent .  Patient will benefit from skilled intervention to increase independence with basic self-care skills and increase level of independence with iADL prior to discharge home with care partner.  Anticipate patient will require intermittent supervision and follow up outpatient.  OT - End of Session Activity Tolerance: Tolerates 30+ min activity without fatigue Endurance Deficit: No OT Assessment Rehab Potential (ACUTE ONLY): Good OT Patient demonstrates impairments in the following area(s): Balance;Cognition;Endurance;Motor;Safety;Sensory OT Basic ADL's Functional Problem(s): Grooming;Bathing;Dressing;Toileting OT Advanced ADL's Functional Problem(s): Simple Meal Preparation;Laundry;Light Housekeeping OT Transfers Functional Problem(s): Toilet;Tub/Shower OT Additional Impairment(s): Fuctional Use of Upper Extremity OT Plan OT Intensity: Minimum of 1-2 x/day, 45 to 90 minutes OT Frequency: 5 out of 7 days OT Duration/Estimated Length of Stay: 5-7 days OT Treatment/Interventions: Balance/vestibular training;Cognitive remediation/compensation;Community reintegration;Discharge planning;Disease mangement/prevention;DME/adaptive equipment instruction;Functional mobility training;Neuromuscular re-education;Pain management;Patient/family education;Psychosocial support;Self Care/advanced ADL retraining;Therapeutic Activities;Therapeutic Exercise;UE/LE Strength taining/ROM;UE/LE Coordination activities;Visual/perceptual remediation/compensation OT Self Feeding Anticipated Outcome(s): not a goal OT Basic Self-Care Anticipated Outcome(s): Mod I OT Toileting Anticipated  Outcome(s): Mod I OT Bathroom Transfers Anticipated Outcome(s): Mod I OT Recommendation Patient destination: Home Follow Up Recommendations: Outpatient OT Equipment Recommended: To be determined   Skilled Therapeutic Intervention OT eval completed with discussion of rehab process, OT purpose, POC, ELOS, and goals.  ADL assessment completed with bathing and dressing at sit > stand level in room shower.  Pt ambulated without AD with close supervision and completed transfers with min assist.  Bathing completed at sit > stand level in room shower with close supervision while in standing but with no LOB.  Completed all dressing without assist at sit > stand level.  Engaged in further visual assessment with pt demonstrating intermittent Lt inattention in upper quadrant > lower quadrant during confrontation testing.  Provided pt with paper to write name, address, and draw a clock.  Pt reports improved handwriting while still "shaky".  Noted improved attention to Lt during clock drawing but incorrectly labeling clock when cued "10 past 11".   OT Evaluation Precautions/Restrictions  Precautions Precautions: Fall Precaution Comments: L visual field deficits General   Vital Signs Therapy Vitals Temp: 98.3 F (36.8 C) Temp Source: Oral Pulse Rate: (!) 53 (nurse notified ) Resp: 18 BP: 136/75 Patient Position (if appropriate): Sitting Oxygen Therapy SpO2: 98 % O2 Device: Not Delivered Pain Pain Assessment Pain Assessment: No/denies pain Home Living/Prior Functioning Home Living Family/patient expects to be discharged to:: Private residence Living Arrangements: Spouse/significant other Available Help at Discharge: Family, Available PRN/intermittently (husband reports he will probably take some time off; Pt reports sister can come stay with her) Type of Home: House Home Access: Stairs to enter [employer] of Steps: 2 and then 1 into house Entrance Stairs-Rails: Right Home  Layout: One level Bathroom Shower/Tub: Gaffer, Architectural technologist: Standard 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; seamstress  Lives With: Spouse IADL History Homemaking Responsibilities: Yes Meal Prep Responsibility: Therapist, occupational Responsibility:  Primary Cleaning Responsibility: Primary Prior Function Level of Independence: Independent with basic ADLs, Independent with homemaking with ambulation, Independent with gait  Able to Take Stairs?: Yes Driving: Yes 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 ADL   Vision/Perception  Vision- History Baseline Vision/History: Wears glasses Wears Glasses: Reading only Patient Visual Report: Peripheral vision impairment;Blurring of vision Vision- Assessment Vision Assessment?: Yes Eye Alignment: Within Functional Limits Ocular Range of Motion: Within Functional Limits Alignment/Gaze Preference: Within Defined Limits Tracking/Visual Pursuits: Impaired - to be further tested in functional context Saccades: Within functional limits Convergence: Impaired (comment) (Rt eye does not converge) Visual Fields: Left visual field deficit;Left superior homonymous quadranopsia (Inattention in Lt upper quadrant> Lt lower with confrontation testing)  Cognition Overall Cognitive Status: Impaired/Different from baseline Arousal/Alertness: Awake/alert Orientation Level: Person;Situation;Place Person: Oriented Place: Oriented Situation: Oriented Year: 2017 Month: August Day of Week: Correct Memory: Impaired Memory Impairment: Retrieval deficit;Decreased recall of new information;Decreased short term memory Decreased Short Term Memory: Verbal basic;Functional basic Immediate Memory Recall: Sock;Blue;Bed Memory Recall: Sock;Blue;Bed Memory Recall Sock: Without Cue Memory Recall Blue: With Cue Memory Recall Bed: With Cue (on 2nd attempt) Attention: Sustained;Selective Sustained Attention: Appears  intact Selective Attention: Appears intact Awareness: Impaired Awareness Impairment: Emergent impairment Problem Solving: Impaired Problem Solving Impairment: Verbal basic;Functional basic Safety/Judgment: Impaired Sensation Sensation Light Touch: Impaired by gross assessment (reports feeling "lighter" on RUE) Proprioception: Appears Intact Coordination Gross Motor Movements are Fluid and Coordinated: Yes Fine Motor Movements are Fluid and Coordinated: Yes Finger Nose Finger Test: mild dysmetria and overshooting with Lt Motor    Mobility     Trunk/Postural Assessment     Balance   Extremity/Trunk Assessment RUE Assessment RUE Assessment: Within Functional Limits LUE Assessment LUE Assessment: Within Functional Limits (strength grossly 4+/5, mild decreased sensation and fine motor control)   See Function Navigator for Current Functional Status.   Refer to Care Plan for Long Term Goals  Recommendations for other services: None  Discharge Criteria: Patient will be discharged from OT if patient refuses treatment 3 consecutive times without medical reason, if treatment goals not met, if there is a change in medical status, if patient makes no progress towards goals or if patient is discharged from hospital.  The above assessment, treatment plan, treatment alternatives and goals were discussed and mutually agreed upon: by patient  Simonne Come 05/23/2016, 2:52 PM

## 2016-05-23 NOTE — Progress Notes (Signed)
65 y.o.right handed femalewith history of CAD status post MI 3 maintained on aspirin 81 mg, hypertension, sickle cell trait and tobacco abuse. Per chart review patient lives with spouse. Independent with a cane prior to admission. One level home with 3 steps to entry. Spouse works third shift. Reports there is a niece that can assist. Presented 05/19/2016 with left facial droop and slurred speech. By report patient has not seen her PCP since 2014 and ran out of her blood pressure medications 2 weeks prior to admission. CT/MRI showed several small acute early subacute infarcts within the right anterior paramedian superior pons, right posterior limb of internal capsule, right mid corona radiata and left parietal operculum. Small late subacute to chronic infarcts within the left splenium of corpus colostrum, left occipital lobe and right inferior pons. MRA of the head with no occlusion aneurysm or dissection without significant stenosis. CT angiogram of the neck no carotid bifurcation stenosis or dissection. Patient did not receive TPA. Underwent TEE 05/21/2016 showing normal LV systolic function. and placement of loop recorder 05/21/2016  Subjective/Complaints: No issues overnight, patient states she slept well, husband in the room, getting started with physical therapy. Review of systems negative for chest pain, shortness of breath, nausea, vomiting, diarrhea or constipation  Objective: Vital Signs: Blood pressure 136/75, pulse (!) 53, temperature 98.3 F (36.8 C), temperature source Oral, resp. rate 18, height '5\' 5"'  (1.651 m), weight 59.3 kg (130 lb 11.2 oz), SpO2 98 %. No results found. Results for orders placed or performed during the hospital encounter of 05/22/16 (from the past 72 hour(s))  CBC     Status: Abnormal   Collection Time: 05/22/16  6:13 PM  Result Value Ref Range   WBC 7.1 4.0 - 10.5 K/uL   RBC 4.71 3.87 - 5.11 MIL/uL   Hemoglobin 13.6 12.0 - 15.0 g/dL   HCT 40.7 36.0 - 46.0 %    MCV 86.4 78.0 - 100.0 fL   MCH 28.9 26.0 - 34.0 pg   MCHC 33.4 30.0 - 36.0 g/dL   RDW 14.0 11.5 - 15.5 %   Platelets 145 (L) 150 - 400 K/uL  Creatinine, serum     Status: None   Collection Time: 05/22/16  6:13 PM  Result Value Ref Range   Creatinine, Ser 0.68 0.44 - 1.00 mg/dL   GFR calc non Af Amer >60 >60 mL/min   GFR calc Af Amer >60 >60 mL/min    Comment: (NOTE) The eGFR has been calculated using the CKD EPI equation. This calculation has not been validated in all clinical situations. eGFR's persistently <60 mL/min signify possible Chronic Kidney Disease.   CBC WITH DIFFERENTIAL     Status: Abnormal   Collection Time: 05/23/16  4:35 AM  Result Value Ref Range   WBC 7.1 4.0 - 10.5 K/uL   RBC 4.56 3.87 - 5.11 MIL/uL   Hemoglobin 13.0 12.0 - 15.0 g/dL   HCT 39.2 36.0 - 46.0 %   MCV 86.0 78.0 - 100.0 fL   MCH 28.5 26.0 - 34.0 pg   MCHC 33.2 30.0 - 36.0 g/dL   RDW 14.4 11.5 - 15.5 %   Platelets 127 (L) 150 - 400 K/uL   Neutrophils Relative % 52 %   Neutro Abs 3.7 1.7 - 7.7 K/uL   Lymphocytes Relative 39 %   Lymphs Abs 2.7 0.7 - 4.0 K/uL   Monocytes Relative 8 %   Monocytes Absolute 0.5 0.1 - 1.0 K/uL   Eosinophils Relative 1 %  Eosinophils Absolute 0.1 0.0 - 0.7 K/uL   Basophils Relative 0 %   Basophils Absolute 0.0 0.0 - 0.1 K/uL  Comprehensive metabolic panel     Status: Abnormal   Collection Time: 05/23/16  4:35 AM  Result Value Ref Range   Sodium 139 135 - 145 mmol/L   Potassium 3.7 3.5 - 5.1 mmol/L   Chloride 108 101 - 111 mmol/L   CO2 25 22 - 32 mmol/L   Glucose, Bld 100 (H) 65 - 99 mg/dL   BUN 9 6 - 20 mg/dL   Creatinine, Ser 0.86 0.44 - 1.00 mg/dL   Calcium 9.3 8.9 - 10.3 mg/dL   Total Protein 5.6 (L) 6.5 - 8.1 g/dL   Albumin 3.2 (L) 3.5 - 5.0 g/dL   AST 21 15 - 41 U/L   ALT 14 14 - 54 U/L   Alkaline Phosphatase 77 38 - 126 U/L   Total Bilirubin 0.7 0.3 - 1.2 mg/dL   GFR calc non Af Amer >60 >60 mL/min   GFR calc Af Amer >60 >60 mL/min    Comment:  (NOTE) The eGFR has been calculated using the CKD EPI equation. This calculation has not been validated in all clinical situations. eGFR's persistently <60 mL/min signify possible Chronic Kidney Disease.    Anion gap 6 5 - 15     HEENT: normal Cardio: RRR and No murmur Resp: CTA B/L and Unlabored GI: BS positive and Nontender, nondistended Extremity:  Pulses positive and No Edema Skin:   Intact Neuro: Alert/Oriented, Normal Sensory, Abnormal Motor 4/5 in the left deltoid, biceps, triceps, grip, hip flexor, knee extensor, ankle dorsal flexor and Other Visual fields are intact to confrontation testing Musc/Skel:  Normal Gen. no acute distress   Assessment/Plan: 1. 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 which require 3+ hours per day of interdisciplinary therapy in a comprehensive inpatient rehab setting. Physiatrist is providing close team supervision and 24 hour management of active medical problems listed below. Physiatrist and rehab team continue to assess barriers to discharge/monitor patient progress toward functional and medical goals. FIM: Function - Bathing Position: Shower (at sit > stand level) Body parts bathed by patient: Right arm, Left arm, Chest, Abdomen, Front perineal area, Buttocks, Right upper leg, Left upper leg, Right lower leg, Left lower leg, Back Assist Level: Supervision or verbal cues  Function- Upper Body Dressing/Undressing What is the patient wearing?: Bra, Pull over shirt/dress, Button up shirt Bra - Perfomed by patient: Thread/unthread right bra strap, Thread/unthread left bra strap, Hook/unhook bra (pull down sports bra) Pull over shirt/dress - Perfomed by patient: Thread/unthread right sleeve, Put head through opening, Thread/unthread left sleeve, Pull shirt over  trunk Button up shirt - Perfomed by patient: Thread/unthread right sleeve, Thread/unthread left sleeve, Pull shirt around back Assist Level: Supervision or verbal cues Function - Lower Body Dressing/Undressing What is the patient wearing?: Pants, Socks, Shoes Position:  (recliner) Pants- Performed by patient: Thread/unthread right pants leg, Thread/unthread left pants leg, Pull pants up/down Socks - Performed by patient: Don/doff right sock, Don/doff left sock Shoes - Performed by patient: Don/doff right shoe, Don/doff left shoe Assist for footwear: Supervision/touching assist Assist for lower body dressing: Supervision or verbal cues  Function - Toileting Toileting steps completed by patient: Adjust clothing prior to toileting, Performs perineal hygiene, Adjust clothing after toileting Toileting Assistive Devices: Grab bar or rail  Assist level: Supervision or verbal cues  Function - Toilet Transfers Toilet transfer assistive device: Grab bar Assist level to toilet: Touching or steadying assistance (Pt > 75%) Assist level from toilet: Touching or steadying assistance (Pt > 75%)  Function - Chair/bed transfer Chair/bed transfer assist level: Touching or steadying assistance (Pt > 75%)  Function - Locomotion: Wheelchair Will patient use wheelchair at discharge?: No Function - Locomotion: Ambulation Assistive device: No device Max distance: 200' Assist level: Touching or steadying assistance (Pt > 75%) Assist level: Supervision or verbal cues Assist level: Touching or steadying assistance (Pt > 75%) Assist level: Touching or steadying assistance (Pt > 75%) Assist level: Touching or steadying assistance (Pt > 75%)  Function - Comprehension Comprehension: Auditory Comprehension assist level: Understands basic 90% of the time/cues < 10% of the time  Function - Expression Expression: Verbal Expression assist level: Expresses complex 90% of the time/cues < 10% of the  time  Function - Social Interaction Social Interaction assist level: Interacts appropriately 90% of the time - Needs monitoring or encouragement for participation or interaction.  Function - Problem Solving Problem solving assist level: Solves basic 75 - 89% of the time/requires cueing 10 - 24% of the time  Function - Memory Memory assist level: Recognizes or recalls 75 - 89% of the time/requires cueing 10 - 24% of the time Patient normally able to recall (first 3 days only): That he or she is in a hospital, Current season  Medical Problem List and Plan: 1.  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 Initiate CIR level PT, OT, speech 2.  DVT Prophylaxis/Anticoagulation: Subcutaneous Lovenox. Monitor platelet counts and any signs of bleeding 3. Pain Management: Tylenol as needed 4. Mood: Lexapro 10 mg daily 5. Neuropsych: This patient is capable of making decisions on her own behalf. 6. Skin/Wound Care: Routine skin checks 7. Fluids/Electrolytes/Nutrition: Routine I&O with follow-up chemistries 8. Hypertension. Norvasc 5 mg daily,. Monitor with increased mobility Vitals:   05/23/16 0453 05/23/16 1322  BP: 139/62 136/75  Pulse: (!) 56 (!) 53  Resp: 18 18  Temp: 98.5 F (36.9 C) 98.3 F (36.8 C)   9. Hyperlipidemia. Zetia 10 mg daily, Lipitor 80 mg daily 10. CAD with history of MI. Continue aspirin. 11. Tobacco abuse. Counseling 12. Sickle cell trait: Monitor for crisis 13. 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 Question whether patient is in and out of A. fib, loop recorder placed, follow-up as an outpatient for monitoring  LOS (Days) 1 A FACE TO [place] E 05/23/2016, 3:19 PM

## 2016-05-23 NOTE — Evaluation (Signed)
Speech Language Pathology Assessment and Plan  Patient Details  Name: Elizabeth [name] MRN: [id number] Date of Birth: 29-Jul-1951  SLP Diagnosis: Cognitive Impairments;Dysarthria  Rehab Potential: Excellent ELOS: 5-7 days     Today's Date: 05/23/2016 SLP Individual Time: 1000-1057 SLP Individual Time Calculation (min): 57 min    Problem List:  Patient Active Problem List   Diagnosis Date Noted  . 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 Facial droop   . Adjustment disorder with anxious mood   . Acute CVA (cerebrovascular accident) ([place]) 05/20/2016  . Slurred speech   . Dysarthria, post-stroke   . Sickle cell trait ([place])   . Acute embolic stroke ([place]) 41/93/7902  . Depression   . Cervical radiculitis 05/17/2013  . Right shoulder pain 11/16/2012  . Anxiety 04/02/2011  . Preventative health care 03/31/2011  . CAD (coronary artery disease)   . Benign essential HTN   . Hyperlipidemia   . Degenerative joint disease   . Tobacco abuse 12/22/2009   Past Medical History:  Past Medical History:  Diagnosis Date  . Abrasion or friction burn of foot and toe(s), with infection   . Allergy    soy  . Anxiety   . 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 Degenerative joint disease    Generalized  . Hyperlipidemia   . Hypertension   . Myocardial infarction ([place])   . Sickle cell trait ([place])   . Tobacco abuse April, 2011   Continued   Past Surgical History:  Past Surgical History:  Procedure Laterality Date  . ANGIOPLASTY  2006   x3 stents  . DILATION AND CURETTAGE OF UTERUS    . EP IMPLANTABLE DEVICE N/A 05/21/2016   Procedure: Loop Recorder Insertion;  Surgeon: Thompson Grayer, MD;  Location: [facility];  Service: Cardiovascular;  Laterality: N/A;  . FOOT SURGERY  2000  . LITHOTRIPSY  2008   left kidney stone and also ureteral stent placed  . TEE WITHOUT CARDIOVERSION N/A 05/21/2016    Procedure: TRANSESOPHAGEAL ECHOCARDIOGRAM (TEE);  Surgeon: Lelon Perla, MD;  Location[facility] ENDOSCOPY;  Service: Cardiovascular;  Laterality: N/A;    Assessment / Plan / Recommendation Clinical Impression Patient is a 64 y.o. 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 Presented 05/19/2016 with left facial droop and slurred speech. By report patient has not seen her PCP since 2014 and ran out of her blood pressure medications 2 weeks prior to admission. CT/MRI showed several small acute early subacute infarcts within the right anterior paramedian superior pons, right posterior limb of internal capsule, right mid corona radiata and left parietal operculum. Small late subacute to chronic infarcts within the left splenium of corpus colostrum, left occipital lobe and right inferior pons. MRA of the head with no occlusion aneurysm or dissection without significant stenosis. CT angiogram of the neck no carotid bifurcation stenosis or dissection. Patient did not receive TPA. Underwent TEE 05/21/2016 showing normal LV systolic function and placement of loop recorder 05/21/2016. Currently maintained on aspirin for CVA prophylaxis. Subcutaneous Lovenox for DVT prophylaxis. Lower extremity Doppler studies negative for DVT. Tolerating a regular consistency diet. Physical and occupational therapy evaluation completed with recommendations of physical medicine rehabilitation consult. Patient was admitted for a comprehensive rehabilitation program on 05/22/16.   Skilled Therapeutic Interventions  Administered a BSE and cognitive-linguistic evaluation. Please see above for details.   SLP Assessment  Patient will need skilled Speech Lanaguage Pathology Services during CIR admission    Recommendations  SLP Diet Recommendations: Dysphagia 3 (Mech soft);Thin Liquid Administration via: Cup Medication Administration:  Whole meds with liquid Supervision: Patient able to self feed Compensations: Minimize environmental distractions;Slow rate;Small sips/bites Postural Changes and/or Swallow Maneuvers: Seated upright 90 degrees Oral Care Recommendations: Oral care BID Recommendations for Other Services: Neuropsych consult Patient destination: Home Follow up Recommendations: 24 hour supervision/assistance;Outpatient SLP Equipment Recommended: None recommended by SLP    SLP Frequency 3 to 5 out of 7 days   SLP Duration  SLP Intensity  SLP Treatment/Interventions 7-10 days   Minumum of 1-2 x/day, 30 to 90 minutes  Cognitive remediation/compensation;Cueing hierarchy;Functional tasks;Patient/family education;Environmental controls;Speech/Language facilitation    Pain No/Denies Pain  Function:  Eating Eating   Modified Consistency Diet: No Eating Assist Level: No help, No cues           Cognition Comprehension Comprehension assist level: Understands basic 90% of the time/cues < 10% of the time  Expression   Expression assist level: Expresses complex 90% of the time/cues < 10% of the time  Social Interaction Social Interaction assist level: Interacts appropriately 90% of the time - Needs monitoring or encouragement for participation or interaction.  Problem Solving Problem solving assist level: Solves basic 75 - 89% of the time/requires cueing 10 - 24% of the time  Memory Memory assist level: Recognizes or recalls 75 - 89% of the time/requires cueing 10 - 24% of the time   Short Term Goals: Week 1: SLP Short Term Goal 1 (Week 1): STGs=LTG's due to short length of stay anticipated   Refer to Care Plan for Long Term Goals  Recommendations for other services: Neuropsych  Discharge Criteria: Patient will be discharged from SLP if patient refuses treatment 3 consecutive times without medical reason, if treatment goals not met, if there is a change in medical status, if patient makes no progress  towards goals or if patient is discharged from hospital.  The above assessment, treatment plan, treatment alternatives and goals were discussed and mutually agreed upon: by patient and by family  Leovardo Thoman 05/23/2016, 12:29 PM

## 2016-05-23 NOTE — Progress Notes (Signed)
Patient information reviewed and entered into eRehab system by Corryn Madewell, RN, CRRN, PPS Coordinator.  Information including medical coding and functional independence measure will be reviewed and updated through discharge.     Per nursing patient was given "Data Collection Information Summary for Patients in Inpatient Rehabilitation Facilities with attached "Privacy Act Statement-Health Care Records" upon admission.  

## 2016-05-24 ENCOUNTER — Telehealth: Payer: Self-pay | Admitting: Emergency Medicine

## 2016-05-24 ENCOUNTER — Inpatient Hospital Stay (HOSPITAL_COMMUNITY): Payer: Medicare Other | Admitting: Physical Therapy

## 2016-05-24 ENCOUNTER — Inpatient Hospital Stay (HOSPITAL_COMMUNITY): Payer: 59 | Admitting: Speech Pathology

## 2016-05-24 ENCOUNTER — Inpatient Hospital Stay (HOSPITAL_COMMUNITY): Payer: 59 | Admitting: Occupational Therapy

## 2016-05-24 ENCOUNTER — Inpatient Hospital Stay (HOSPITAL_COMMUNITY): Payer: 59

## 2016-05-24 DIAGNOSIS — I69398 Other sequelae of cerebral infarction: Secondary | ICD-10-CM

## 2016-05-24 DIAGNOSIS — R269 Unspecified abnormalities of gait and mobility: Secondary | ICD-10-CM

## 2016-05-24 NOTE — Patient Care Conference (Signed)
Inpatient RehabilitationTeam Conference and Plan of Care Update Date: 05/24/2016   Time: 2:34 PM    Patient Name: Elizabeth [name]      Medical Record Number: [id number]  Date of Birth: 11/02/1950 Sex: Female         Room/Bed: 4W08C/4W08C-01 Payor Info: Payor: Theme park manager / Plan: [place] / Product Type: *No Product type* /    Admitting Diagnosis: CVA  Admit Date/Time:  05/22/2016  5:47 PM Admission Comments: No comment available   Primary Diagnosis:  <principal problem not specified> Principal Problem: <principal problem not specified>  Patient Active Problem List   Diagnosis Date Noted  . 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 Facial droop   . Adjustment disorder with anxious mood   . Acute CVA (cerebrovascular accident) ([place]) 05/20/2016  . Slurred speech   . Dysarthria, post-stroke   . Sickle cell trait ([place])   . Acute embolic stroke ([place]) [id number]  . Depression   . Cervical radiculitis 05/17/2013  . Right shoulder pain 11/16/2012  . Anxiety 04/02/2011  . Preventative health care 03/31/2011  . CAD (coronary artery disease)   . Benign essential HTN   . Hyperlipidemia   . Degenerative joint disease   . Tobacco abuse 12/22/2009    Expected Discharge Date: Expected Discharge Date: 05/27/16  Team Members Present: Physician leading conference: Dr. Alysia Penna Social Worker Present: Ovidio Kin, LCSW Nurse Present: Dorien Chihuahua, RN PT Present: Canary Brim, PT OT Present: Simonne Come, OT SLP Present: Weston Anna, SLP PPS Coordinator present : Daiva Nakayama, RN, CRRN     Current Status/Progress Goal Weekly Team Focus  Medical   BPs controlled, no bowel bladder or skin issues  maintain medical stability for home D/C  Discharge planning   Bowel/Bladder   continent of B&B. LBM 05/22/16  remain contienent of B&B with min assist   Offer toileting q2 hours and prn    Swallow/Nutrition/ Hydration             ADL's     supervision overall   mod/i  set up with bathing and dressing-supervision tub transfer   family education, balance, adl's and l-inattention awareness  Mobility   supervision overall  mod I to supervision overall  d/c planning, family education, balance, gait, neuro re-ed, L inattention,    Communication             Safety/Cognition/ Behavioral Observations            Pain   No complaints of pain.   Manage pain at 3 or less   Assess for pain q4 hours and prn and treat accordingly    Skin   Clean, Dry, Intact.   No new skin breakdown.   Assess skin q shift and prn. Monitor for skin breakdown.       *See Care Plan and progress notes for long and short-term goals.  Barriers to Discharge: Still at high fall risk    Possible Resolutions to Barriers:  Continue rehabilitation with goals of modified independent    Discharge Planning/Teaching Needs:    Home with husband who is taking a FMLA form work to be there with her. Doing well and will reach mod/i level     Team Discussion: Pt's goals mod/i level, will reach these by Monday. Husband is here for family education and is taking a leave form his job for a short time. Medically stable and BP stable. Will do OP therapies   Revisions to Treatment Plan:  Ready  for DC 9/4   Continued Need for Acute Rehabilitation Level of Care: The patient requires daily medical management by a physician with specialized training in physical medicine and rehabilitation for the following conditions: Daily direction of a multidisciplinary physical rehabilitation program to ensure safe treatment while eliciting the highest outcome that is of practical value to the patient.: Yes Daily medical management of patient stability for increased activity during participation in an intensive rehabilitation regime.: Yes Daily analysis of laboratory values and/or radiology reports with any subsequent need for medication adjustment of medical intervention for : Neurological problems  Cherrill Scrima, Gardiner Rhyme 05/24/2016, 2:32 PM

## 2016-05-24 NOTE — Progress Notes (Signed)
Physical Therapy Session Note  Patient Details  Name: Elizabeth [name] MRN: [id number] Date of Birth: 1950-10-25  Today's Date: 05/24/2016 PT Individual Time: 1115-1155 PT Individual Time Calculation (min): 40 min    Short Term Goals: Week 1:  PT Short Term Goal 1 (Week 1): = LTGs due to LOS  Skilled Therapeutic Interventions/Progress Updates:    Session focused on functional gait on unit with overall supervision (min verbal cues for attention to L side at times), instructed in fall recovery and floor transfers at supervision level reviewing what to do in case of fall, and neuro re-ed in quadruped to address stability, weightbearing/approximation, and balance. Instructed in 5-10 reps each of alternating UE reaching, alternating LE extension, and then opposite arm/leg extension. Handouts given and reviewed with patient as well. Issued [place] Level B HEP and reviewed with patient but would benefit from practice of each exercises. D/c planning and discussion of follow up therapies (OPPT vs HHPT).    Therapy Documentation Precautions:  Precautions Precautions: Fall Precaution Comments: L visual field deficits Restrictions Weight Bearing Restrictions: No  Pain: Denies pain. Anxious to get IV out of arm.   See Function Navigator for Current Functional Status.   Therapy/Group: Individual Therapy  Elizabeth [name], PT, DPT  05/24/2016, 12:12 PM

## 2016-05-24 NOTE — Telephone Encounter (Signed)
Ok with me 

## 2016-05-24 NOTE — IPOC Note (Signed)
Overall Plan of Care [facility]) Patient Details Name: Elizabeth [name] MRN: [id number] DOB: [birth year]  Admitting Diagnosis: CVA  Hospital Problems: Active Problems:   Right pontine CVA (Gray)     Functional Problem List: Nursing Endurance, Medication Management, Motor, Safety, Perception, Sensory, Bladder, Bowel, Skin Integrity  PT Balance, Endurance, Motor, Perception, Safety  OT Balance, Cognition, Endurance, Motor, Safety, Sensory  SLP Cognition  TR         Basic ADL's: OT Grooming, Bathing, Dressing, Toileting     Advanced  ADL's: OT Simple Meal Preparation, Laundry, Light Housekeeping     Transfers: PT Bed Mobility, Bed to Chair, Car, Furniture, Floor  OT Toilet, Tub/Shower     Locomotion: PT Ambulation, Stairs     Additional Impairments: OT Fuctional Use of Upper Extremity  SLP Communication, Social Cognition expression Problem Solving, Memory, Awareness  TR      Anticipated Outcomes Item Anticipated Outcome  Self Feeding not a goal  Swallowing      Basic self-care  Mod I  Toileting  Mod I   Bathroom Transfers Mod I  Bowel/Bladder  Mod I  Transfers  mod I   Locomotion  mod I household gait; supervision community gait  Communication  Mod I   Cognition  Supervision   Pain  less than 2  Safety/Judgment  Mod I   Therapy Plan: PT Intensity: Minimum of 1-2 x/day ,45 to 90 minutes PT Frequency: 5 out of 7 days PT Duration Estimated Length of Stay: 5-7 days OT Intensity: Minimum of 1-2 x/day, 45 to 90 minutes OT Frequency: 5 out of 7 days OT Duration/Estimated Length of Stay: 5-7 days SLP Intensity: Minumum of 1-2 x/day, 30 to 90 minutes SLP Frequency: 3 to 5 out of 7 days SLP Duration/Estimated Length of Stay: 5-7 days       Team Interventions: Nursing Interventions Disease Management/Prevention, Medication Management, Skin Care/Wound Management, Cognitive Remediation/Compensation, Dysphagia/Aspiration Precaution Training, Discharge Planning,  Psychosocial Support, Patient/Family Education, Bladder Management, Bowel Management  PT interventions Balance/vestibular training, Ambulation/gait training, Community reintegration, Discharge planning, Disease management/prevention, DME/adaptive equipment instruction, Functional mobility training, Neuromuscular re-education, Pain management, Patient/family education, Psychosocial support, Skin care/wound management, Splinting/orthotics, Stair training, Therapeutic Activities, Therapeutic Exercise, UE/LE Strength taining/ROM, UE/LE Coordination activities, Wheelchair propulsion/positioning, Visual/perceptual remediation/compensation  OT Interventions Training and development officer, Cognitive remediation/compensation, Academic librarian, Discharge planning, Disease mangement/prevention, DME/adaptive equipment instruction, Functional mobility training, Neuromuscular re-education, Pain management, Patient/family education, Psychosocial support, Self Care/advanced ADL retraining, Therapeutic Activities, Therapeutic Exercise, UE/LE Strength taining/ROM, UE/LE Coordination activities, Visual/perceptual remediation/compensation  SLP Interventions Cognitive remediation/compensation, Cueing hierarchy, Functional tasks, Patient/family education, Environmental controls, Speech/Language facilitation  TR Interventions    SW/CM Interventions Discharge Planning, Psychosocial Support, Patient/Family Education    Team Discharge Planning: Destination: PT-Home ,OT- Home , SLP-Home Projected Follow-up: PT-Outpatient PT, OT-  Outpatient OT, SLP-24 hour supervision/assistance, Outpatient SLP Projected Equipment Needs: PT-To be determined, OT- To be determined, SLP-None recommended by SLP Equipment Details: PT- , OT-  Patient/family involved in discharge planning: PT- Patient, Family member/caregiver,  OT-Patient, SLP-Patient, Family member/caregiver  MD ELOS: 5-7 days Medical Rehab Prognosis:  Excellent Assessment: The  patient has been admitted for CIR therapies with the diagnosis of right brain infarcts with left hemiparesis. The team will be addressing functional mobility, strength, stamina, balance, safety, adaptive techniques and equipment, self-care, bowel and bladder mgt, patient and caregiver education, NMR, visual-spatial awareness, cognition, communication, stroke education, community reintegration. Goals have been set at mod I for mobility, self-care/ADL's and cognition/communication. Pt is motivated. No barriers to  Discharge at this point.Meredith Staggers, MD, FAAPMR      See Team Conference Notes for weekly updates to the plan of care

## 2016-05-24 NOTE — Telephone Encounter (Signed)
Pt had a stroke and is [facility] re[facility]. She needs a hospital FU. She hasnt been seen since 04/2013. Would you be willing to except her as a new pt again. Please advise thanks.

## 2016-05-24 NOTE — Telephone Encounter (Signed)
Called Becky back. No answer but left a message to give [place] a call back to schedule hospital fu for patient. Thanks.

## 2016-05-24 NOTE — Progress Notes (Signed)
Subjective/Complaints: Slept well, had a good first day in therapy. Review of systems negative for chest pain, shortness of breath, nausea, vomiting, diarrhea or constipation  Objective: Vital Signs: Blood pressure (!) 109/53, pulse (!) 58, temperature 98.5 F (36.9 C), temperature source Oral, resp. rate 18, height '5\' 5"'  (1.651 m), weight 59.3 kg (130 lb 11.2 oz), SpO2 100 %. No results found. Results for orders placed or performed during the hospital encounter of 05/22/16 (from the past 72 hour(s))  CBC     Status: Abnormal   Collection Time: 05/22/16  6:13 PM  Result Value Ref Range   WBC 7.1 4.0 - 10.5 K/uL   RBC 4.71 3.87 - 5.11 MIL/uL   Hemoglobin 13.6 12.0 - 15.0 g/dL   HCT 40.7 36.0 - 46.0 %   MCV 86.4 78.0 - 100.0 fL   MCH 28.9 26.0 - 34.0 pg   MCHC 33.4 30.0 - 36.0 g/dL   RDW 14.0 11.5 - 15.5 %   Platelets 145 (L) 150 - 400 K/uL  Creatinine, serum     Status: None   Collection Time: 05/22/16  6:13 PM  Result Value Ref Range   Creatinine, Ser 0.68 0.44 - 1.00 mg/dL   GFR calc non Af Amer >60 >60 mL/min   GFR calc Af Amer >60 >60 mL/min    Comment: (NOTE) The eGFR has been calculated using the CKD EPI equation. This calculation has not been validated in all clinical situations. eGFR's persistently <60 mL/min signify possible Chronic Kidney Disease.   CBC WITH DIFFERENTIAL     Status: Abnormal   Collection Time: 05/23/16  4:35 AM  Result Value Ref Range   WBC 7.1 4.0 - 10.5 K/uL   RBC 4.56 3.87 - 5.11 MIL/uL   Hemoglobin 13.0 12.0 - 15.0 g/dL   HCT 39.2 36.0 - 46.0 %   MCV 86.0 78.0 - 100.0 fL   MCH 28.5 26.0 - 34.0 pg   MCHC 33.2 30.0 - 36.0 g/dL   RDW 14.4 11.5 - 15.5 %   Platelets 127 (L) 150 - 400 K/uL   Neutrophils Relative % 52 %   Neutro Abs 3.7 1.7 - 7.7 K/uL   Lymphocytes Relative 39 %   Lymphs Abs 2.7 0.7 - 4.0 K/uL   Monocytes Relative 8 %   Monocytes Absolute 0.5 0.1 - 1.0 K/uL   Eosinophils Relative 1 %   Eosinophils Absolute 0.1 0.0 - 0.7  K/uL   Basophils Relative 0 %   Basophils Absolute 0.0 0.0 - 0.1 K/uL  Comprehensive metabolic panel     Status: Abnormal   Collection Time: 05/23/16  4:35 AM  Result Value Ref Range   Sodium 139 135 - 145 mmol/L   Potassium 3.7 3.5 - 5.1 mmol/L   Chloride 108 101 - 111 mmol/L   CO2 25 22 - 32 mmol/L   Glucose, Bld 100 (H) 65 - 99 mg/dL   BUN 9 6 - 20 mg/dL   Creatinine, Ser 0.86 0.44 - 1.00 mg/dL   Calcium 9.3 8.9 - 10.3 mg/dL   Total Protein 5.6 (L) 6.5 - 8.1 g/dL   Albumin 3.2 (L) 3.5 - 5.0 g/dL   AST 21 15 - 41 U/L   ALT 14 14 - 54 U/L   Alkaline Phosphatase 77 38 - 126 U/L   Total Bilirubin 0.7 0.3 - 1.2 mg/dL   GFR calc non Af Amer >60 >60 mL/min   GFR calc Af Amer >60 >60 mL/min  Comment: (NOTE) The eGFR has been calculated using the CKD EPI equation. This calculation has not been validated in all clinical situations. eGFR's persistently <60 mL/min signify possible Chronic Kidney Disease.    Anion gap 6 5 - 15     HEENT: normal Cardio: RRR and No murmur Resp: CTA B/L and Unlabored GI: BS positive and Nontender, nondistended Extremity:  Pulses positive and No Edema Skin:   Intact Neuro: Alert/Oriented, Normal Sensory, Abnormal Motor 4/5 in the left deltoid, biceps, triceps, grip, hip flexor, knee extensor, ankle dorsal flexor and Other Visual fields are intact to confrontation testing Musc/Skel:  Normal Gen. no acute distress   Assessment/Plan: 1. 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 which require 3+ hours per day of interdisciplinary therapy in a comprehensive inpatient rehab setting. Physiatrist is providing close team supervision and 24 hour management of active medical problems listed below. Physiatrist and rehab team continue to assess barriers to discharge/monitor patient  progress toward functional and medical goals. FIM: Function - Bathing Position: Shower (at sit > stand level) Body parts bathed by patient: Right arm, Left arm, Chest, Abdomen, Front perineal area, Buttocks, Right upper leg, Left upper leg, Right lower leg, Left lower leg, Back Assist Level: Supervision or verbal cues  Function- Upper Body Dressing/Undressing What is the patient wearing?: Bra, Pull over shirt/dress, Button up shirt Bra - Perfomed by patient: Thread/unthread right bra strap, Thread/unthread left bra strap, Hook/unhook bra (pull down sports bra) Pull over shirt/dress - Perfomed by patient: Thread/unthread right sleeve, Put head through opening, Thread/unthread left sleeve, Pull shirt over trunk Button up shirt - Perfomed by patient: Thread/unthread right sleeve, Thread/unthread left sleeve, Pull shirt around back Assist Level: Supervision or verbal cues Function - Lower Body Dressing/Undressing What is the patient wearing?: Pants, Socks, Shoes Position:  (recliner) Pants- Performed by patient: Thread/unthread right pants leg, Thread/unthread left pants leg, Pull pants up/down Socks - Performed by patient: Don/doff right sock, Don/doff left sock Shoes - Performed by patient: Don/doff right shoe, Don/doff left shoe Assist for footwear: Supervision/touching assist Assist for lower body dressing: Supervision or verbal cues  Function - Toileting Toileting steps completed by patient: Adjust clothing prior to toileting, Performs perineal hygiene, Adjust clothing after toileting Toileting Assistive Devices: Grab bar or rail Assist level: Supervision or verbal cues  Function Midwife transfer assistive device: Grab bar Assist level to toilet: Touching or steadying assistance (Pt > 75%) Assist level from toilet: Touching or steadying assistance (Pt > 75%)  Function - Chair/bed transfer Chair/bed transfer assist level: Touching or steadying assistance (Pt >  75%)  Function - Locomotion: Wheelchair Will patient use wheelchair at discharge?: No Function - Locomotion: Ambulation Assistive device: No device Max distance: 200' Assist level: Touching or steadying assistance (Pt > 75%) Assist level: Supervision or verbal cues Assist level: Touching or steadying assistance (Pt > 75%) Assist level: Touching or steadying assistance (Pt > 75%) Assist level: Touching or steadying assistance (Pt > 75%)  Function - Comprehension Comprehension: Auditory Comprehension assist level: Understands basic 90% of the time/cues < 10% of the time  Function - Expression Expression: Verbal Expression assist level: Expresses complex 90% of the time/cues < 10% of the time  Function - Social Interaction Social Interaction assist level: Interacts appropriately 90% of the time - Needs monitoring or encouragement for participation or interaction.  Function -  Problem Solving Problem solving assist level: Solves basic 75 - 89% of the time/requires cueing 10 - 24% of the time  Function - Memory Memory assist level: Recognizes or recalls 75 - 89% of the time/requires cueing 10 - 24% of the time Patient normally able to recall (first 3 days only): That he or she is in a hospital, Current season  Medical Problem List and Plan: 1.  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 Continue CIR level PT, OT, speech 2.  DVT Prophylaxis/Anticoagulation: Subcutaneous Lovenox. Monitor platelet counts and any signs of bleeding 3. Pain Management: Tylenol as needed 4. Mood: Lexapro 10 mg daily 5. Neuropsych: This patient is capable of making decisions on her own behalf. 6. Skin/Wound Care: Routine skin checks 7. Fluids/Electrolytes/Nutrition: Routine I&O with follow-up electolytes normal  8. Hypertension. Norvasc 5 mg daily,. Monitor with  increased mobility Vitals:   05/23/16 1929 05/24/16 0447  BP: (!) 147/71 (!) 109/53  Pulse: (!) 58 (!) 58  Resp:    Temp:  98.5 F (36.9 C)   9. Hyperlipidemia. Zetia 10 mg daily, Lipitor 80 mg daily 10. CAD with history of MI. Continue aspirin. 11. Tobacco abuse. Counseling 12. Sickle cell trait: no evidence of anemia 13. 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 Question whether patient is in and out of A. fib, loop recorder placed, follow-up as an outpatient for monitoring 14.  Hypoalbuminemia- start prostat  LOS (Days) 2 A FACE TO FACE EVALUATION WAS PERFORMED  Fran Mcree E 05/24/2016, 8:32 AM

## 2016-05-24 NOTE — Progress Notes (Signed)
Physical Therapy Session Note  Patient Details  Name: Elizabeth [name] MRN: [id number] Date of Birth: 08-Jun-1951  Today's Date: 05/24/2016 PT Individual Time: 1415-1500 PT Individual Time Calculation (min): 45 min    Short Term Goals: Week 1:  PT Short Term Goal 1 (Week 1): = LTGs due to LOS  Skilled Therapeutic Interventions/Progress Updates:  Pt received in recliner, no c/o pain.  Pt ambulated in controlled environment x 150' with supervision with intermittent cues to attend to LUE due to running into doorways.  Reviewed OTAGO balance HEP B in ADL kitchen with pt UE support on counter top and supervision.  Reviewed stair negotiation with pt performing up/down 12 stairs x 2 reps with one UE support on R rail and supervision with verbal cues to attend to L foot placement.  Returned to apartment and engaged pt in functional home activities to focus on dynamic standing balance, sequencing and attention to LUE during sweeping the kitchen, retrieving items from cabinet and placing them on a plate and carrying plate or cup in LUE to/from table, folding laundry and carrying laundry basket all with supervision and verbal cues to attend to LUE.  Returned to room and to recliner with supervision and left with visitor in room.   Therapy Documentation Precautions:  Precautions Precautions: Fall Precaution Comments: L visual field deficits Restrictions Weight Bearing Restrictions: No Vital Signs: Therapy Vitals Temp: 98.2 F (36.8 C) Temp Source: Oral Pulse Rate: (!) 55 Resp: 16 BP: (!) 167/78 (the nurse was notified ) Patient Position (if appropriate): Sitting Oxygen Therapy SpO2: 100 % O2 Device: Not Delivered  See Function Navigator for Current Functional Status.   Therapy/Group: Individual Therapy  Raylene Everts [facility] 05/24/2016, 4:35 PM

## 2016-05-24 NOTE — Progress Notes (Signed)
Speech Language Pathology Daily Session Note  Patient Details  Name: Elizabeth [name] MRN: [id number] Date of Birth: 10/19/1950  Today's Date: 05/24/2016 SLP Individual Time: 0915-1005 SLP Individual Time Calculation (min): 50 min   Short Term Goals: Week 1: SLP Short Term Goal 1 (Week 1): STGs=LTG's due to short length of stay anticipated   Skilled Therapeutic Interventions: Skilled treatment session focused on cognitive goals. Upon arrival, patient was sitting EOB. Patient independently donned TED hose and socks with extra time. Patient ambulated to and from SLP office with extra time and Min A verbal cues for attention to left field of environment and path finding. Patient required Max A for recall of her current medications and their functions but organized a BID pill box with Mod I. Patient was 100% intelligible at the conversation level with Mod I. Patient left upright sitting EOB with family present. Continue with current plan of care.   Function:  Cognition Comprehension Comprehension assist level: Understands basic 90% of the time/cues < 10% of the time  Expression   Expression assist level: Expresses complex 90% of the time/cues < 10% of the time  Social Interaction Social Interaction assist level: Interacts appropriately 90% of the time - Needs monitoring or encouragement for participation or interaction.  Problem Solving Problem solving assist level: Solves basic 90% of the time/requires cueing < 10% of the time  Memory Memory assist level: Recognizes or recalls 75 - 89% of the time/requires cueing 10 - 24% of the time    Pain No/Denies Pain   Therapy/Group: Individual Therapy  Aquila Menzie, [place] 05/24/2016, 3:21 PM

## 2016-05-24 NOTE — Progress Notes (Signed)
Social Work Patient ID: Elizabeth [name], female   DOB: Apr 30, 1951, 65 y.o.   MRN: [id number]  Team feels pt will be ready for discharge Monday 9/4, will have met her goals. MD/PA aware and are agreeable. Husband is here for education today and have given him his completed FMLA papers. All in agreement with discharge Monday.

## 2016-05-24 NOTE — Progress Notes (Signed)
Occupational Therapy Session Note  Patient Details  Name: Elizabeth [name] MRN: [id number] Date of Birth: 1950-12-21  Today's Date: 05/24/2016 OT Individual Time: [id number] OT Individual Time Calculation (min): 57 min     Short Term Goals: Week 1:  OT Short Term Goal 1 (Week 1): STG = LTGs due to short ELOS  Skilled Therapeutic Interventions/Progress Updates:    Treatment session with focus on dynamic standing balance, safety awareness, Lt attention, and functional use of dominant LUE. Pt ambulated around room to gather clothing prior to shower. Engaged in bathing in standing with focus on dynamic standing balance and safety with close supervision, especially in single leg stance when washing legs and feet.  Good awareness and use of grab bar or leaning against wall as needed.  Dressing completed at sit > stand level.  Ambulated to ADL apt without AD and supervision.  Began "pill box assessment" with focus on functional use of BUE and following directions.  Pt with some difficulty with 4 section pill box when sorting pills but able to correctly identify sequencing of pills when pill box removed. After speaking with SLP, pt had completed same task with her medications and her routine with no errors or concerns.  Pt gathered items from high shelves and floor to make bed in ADL apt with one LOB but able to correct self without any assist from therapist.  Retrieved food items from kitchen to simulate meal prep but would benefit from additional meal prep task to address safety.  Returned to room and passed off to PT.  Therapy Documentation Precautions:  Precautions Precautions: Fall Precaution Comments: L visual field deficits Restrictions Weight Bearing Restrictions: No Pain:  Pt with no c/o pain  See Function Navigator for Current Functional Status.   Therapy/Group: Individual Therapy  Simonne Come 05/24/2016, 1:19 PM

## 2016-05-25 ENCOUNTER — Inpatient Hospital Stay (HOSPITAL_COMMUNITY): Payer: Medicare Other | Admitting: Physical Therapy

## 2016-05-25 ENCOUNTER — Inpatient Hospital Stay (HOSPITAL_COMMUNITY): Payer: 59 | Admitting: Occupational Therapy

## 2016-05-25 ENCOUNTER — Inpatient Hospital Stay (HOSPITAL_COMMUNITY): Payer: 59 | Admitting: Speech Pathology

## 2016-05-25 NOTE — Progress Notes (Signed)
Physical Therapy Session Note  Patient Details  Name: Elizabeth [name] MRN: [id number] Date of Birth: 05-Jan-1951  Today's Date: 05/25/2016 PT Individual Time: 1300-1400  AND 1630-1700 PT Individual Time Calculation (min): 60 min AND 30 min    Short Term Goals: Week 1:  PT Short Term Goal 1 (Week 1): = LTGs due to LOS  Skilled Therapeutic Interventions/Progress Updates:    Session 1  Patient received supine in bed and agreeable to PT.  PT instructed patient in gait training for 647f, 7064f [id number] [id number] hospital with supervision A progressing to mod I following instruction for improved foot clearance.  Gait training alos instructed over unlevel surface including cement sidewalk and handicap access ramp for [id number] supervision A from PT.   PT instructed patient in qped therex for alternating shoulder flexion x 10 BLE, alternating hip extension x 10 BLE.   Patient transferred from qped to supine with out cues or assist from PT. Supine therex:  Bridge with 2 second hold x 10  Single leg bridge with 2 second hold x 8 Alternating marches x12 with level 3 tband  Clam shells x 10 level 3 tband  SLR with 4# weight PT provided supervision A for supine therex with min-mod cues for improved technique including increased ROM as appropriate, decreased speed of eccentric movement to improved strengthening, and decreased compensation from trunk.   Therapy ball therex.  stabilizaiton x 3 minutes.  alteranting hip flexion 3 sets x 30 seconds. Min A from PT throughout therapy ball core activation exercises. PT also provided tactile stimulation of L sided trunk musculature to improve correction movement and maintain balance.   Patient returned to room and left sitting in chair with call bell in reach.   Session 2.   Patient received supine in bed and agreeable to PT. Bed mobility to come to sitting at EOB without cues or assistance from PT. Sit>stand transfers all performed throughout PT  treatment without cues or assistance but note to take increased time.  Patient performed gait training in hall for 180ft 37f supervision A. One noted slight LOB while trying to divide attention, but patient able to correct without Assist from PT.   PT instructed patient in Nustep level 3>6 x 10 minutes neuro re-ed for increased use of LUE and LLE as well as improved awareness of LUE in space. PT required to cues patient to miss-alignment of LUE x 2 throughout exercise.   Dynamic gait training through obstacle course including ascent/descent 6 inch step, around cones, over obstacle 2-6 inches in height. Supervision A from PT throughout obstacle training for increased safety.  Patient returned to room and left in bed with call bell in reach, bed alarm set, and all needs met.           Therapy Documentation Precautions:  Precautions Precautions: Fall Precaution Comments: L visual field deficits Restrictions Weight Bearing Restrictions: No General:   Vital Signs: Therapy Vitals Temp: 98.1 F (36.7 C) Temp Source: Oral Pulse Rate: 60 Resp: 18 BP: 139/75 Patient Position (if appropriate): Sitting Oxygen Therapy SpO2: 96 % O2 Device: Not Delivered Pain: Pain Assessment Pain Assessment: No/denies pain See Function Navigator for Current Functional Status.   Therapy/Group: Individual Therapy  AustinLorie Phenix017, 3:58 PM

## 2016-05-25 NOTE — Progress Notes (Signed)
Subjective/Complaints: Overall doing well. Upset about what she received for breakfast though. Review of systems negative for chest pain, shortness of breath, nausea, vomiting, diarrhea or constipation  Objective: Vital Signs: Blood pressure (!) 145/85, pulse 77, temperature 98.2 F (36.8 C), temperature source Oral, resp. rate 18, height '5\' 5"'  (1.651 m), weight 59.3 kg (130 lb 11.2 oz), SpO2 95 %. No results found. Results for orders placed or performed during the hospital encounter of 05/22/16 (from the past 72 hour(s))  CBC     Status: Abnormal   Collection Time: 05/22/16  6:13 PM  Result Value Ref Range   WBC 7.1 4.0 - 10.5 K/uL   RBC 4.71 3.87 - 5.11 MIL/uL   Hemoglobin 13.6 12.0 - 15.0 g/dL   HCT 40.7 36.0 - 46.0 %   MCV 86.4 78.0 - 100.0 fL   MCH 28.9 26.0 - 34.0 pg   MCHC 33.4 30.0 - 36.0 g/dL   RDW 14.0 11.5 - 15.5 %   Platelets 145 (L) 150 - 400 K/uL  Creatinine, serum     Status: None   Collection Time: 05/22/16  6:13 PM  Result Value Ref Range   Creatinine, Ser 0.68 0.44 - 1.00 mg/dL   GFR calc non Af Amer >60 >60 mL/min   GFR calc Af Amer >60 >60 mL/min    Comment: (NOTE) The eGFR has been calculated using the CKD EPI equation. This calculation has not been validated in all clinical situations. eGFR's persistently <60 mL/min signify possible Chronic Kidney Disease.   CBC WITH DIFFERENTIAL     Status: Abnormal   Collection Time: 05/23/16  4:35 AM  Result Value Ref Range   WBC 7.1 4.0 - 10.5 K/uL   RBC 4.56 3.87 - 5.11 MIL/uL   Hemoglobin 13.0 12.0 - 15.0 g/dL   HCT 39.2 36.0 - 46.0 %   MCV 86.0 78.0 - 100.0 fL   MCH 28.5 26.0 - 34.0 pg   MCHC 33.2 30.0 - 36.0 g/dL   RDW 14.4 11.5 - 15.5 %   Platelets 127 (L) 150 - 400 K/uL   Neutrophils Relative % 52 %   Neutro Abs 3.7 1.7 - 7.7 K/uL   Lymphocytes Relative 39 %   Lymphs Abs 2.7 0.7 - 4.0 K/uL   Monocytes Relative 8 %   Monocytes Absolute 0.5 0.1 - 1.0 K/uL   Eosinophils Relative 1 %   Eosinophils  Absolute 0.1 0.0 - 0.7 K/uL   Basophils Relative 0 %   Basophils Absolute 0.0 0.0 - 0.1 K/uL  Comprehensive metabolic panel     Status: Abnormal   Collection Time: 05/23/16  4:35 AM  Result Value Ref Range   Sodium 139 135 - 145 mmol/L   Potassium 3.7 3.5 - 5.1 mmol/L   Chloride 108 101 - 111 mmol/L   CO2 25 22 - 32 mmol/L   Glucose, Bld 100 (H) 65 - 99 mg/dL   BUN 9 6 - 20 mg/dL   Creatinine, Ser 0.86 0.44 - 1.00 mg/dL   Calcium 9.3 8.9 - 10.3 mg/dL   Total Protein 5.6 (L) 6.5 - 8.1 g/dL   Albumin 3.2 (L) 3.5 - 5.0 g/dL   AST 21 15 - 41 U/L   ALT 14 14 - 54 U/L   Alkaline Phosphatase 77 38 - 126 U/L   Total Bilirubin 0.7 0.3 - 1.2 mg/dL   GFR calc non Af Amer >60 >60 mL/min   GFR calc Af Amer >60 >60 mL/min  Comment: (NOTE) The eGFR has been calculated using the CKD EPI equation. This calculation has not been validated in all clinical situations. eGFR's persistently <60 mL/min signify possible Chronic Kidney Disease.    Anion gap 6 5 - 15     HEENT: normal Cardio: RRR and No murmur Resp: CTA B/L and Unlabored GI: BS positive and Nontender, nondistended Extremity:  Pulses positive and No Edema Skin:   Intact Neuro: Alert/Oriented, Normal Sensory.  Motor: remains 4/5 in the left deltoid, biceps, triceps, grip, hip flexor, knee extensor, ankle dorsal flexor. Decreased [place].  Visual fields are intact to confrontation testing Musc/Skel:  Normal Gen. no acute distress   Assessment/Plan: 1. 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 which require 3+ hours per day of interdisciplinary therapy in a comprehensive inpatient rehab setting. Physiatrist is providing close team supervision and 24 hour management of active medical problems listed below. Physiatrist and rehab team continue to assess barriers to  discharge/monitor patient progress toward functional and medical goals. FIM: Function - Bathing Position: Shower (in standing) Body parts bathed by patient: Right arm, Left arm, Chest, Abdomen, Front perineal area, Buttocks, Right upper leg, Left upper leg, Right lower leg, Left lower leg, Back Assist Level: Supervision or verbal cues  Function- Upper Body Dressing/Undressing What is the patient wearing?: Bra, Pull over shirt/dress, Button up shirt Bra - Perfomed by patient: Thread/unthread right bra strap, Thread/unthread left bra strap, Hook/unhook bra (pull down sports bra) Pull over shirt/dress - Perfomed by patient: Thread/unthread right sleeve, Put head through opening, Thread/unthread left sleeve, Pull shirt over trunk Button up shirt - Perfomed by patient: Thread/unthread right sleeve, Thread/unthread left sleeve, Pull shirt around back Assist Level: More than reasonable time Function - Lower Body Dressing/Undressing What is the patient wearing?: Pants, Socks, Shoes Position: Sitting EOB Pants- Performed by patient: Thread/unthread right pants leg, Thread/unthread left pants leg, Pull pants up/down Socks - Performed by patient: Don/doff right sock, Don/doff left sock Shoes - Performed by patient: Don/doff right shoe, Don/doff left shoe Assist for footwear: Setup Assist for lower body dressing: Set up Set up : To obtain clothing/put away  Function - Toileting Toileting steps completed by patient: Adjust clothing prior to toileting, Performs perineal hygiene, Adjust clothing after toileting Toileting Assistive Devices: Grab bar or rail Assist level: Touching or steadying assistance (Pt.75%)  Function - Toilet Transfers Toilet transfer assistive device: Grab bar Assist level to toilet: Touching or steadying assistance (Pt > 75%) Assist level from toilet: Touching or steadying assistance (Pt > 75%)  Function - Chair/bed transfer Chair/bed transfer method: Ambulatory Chair/bed  transfer assist level: Supervision or verbal cues  Function - Locomotion: Wheelchair Will patient use wheelchair at discharge?: No Function - Locomotion: Ambulation Assistive device: No device Max distance: 200' Assist level: Supervision or verbal cues Assist level: Supervision or verbal cues Assist level: Supervision or verbal cues Assist level: Supervision or verbal cues Assist level: Touching or steadying assistance (Pt > 75%)  Function - Comprehension Comprehension: Auditory Comprehension assist level: Understands basic 90% of the time/cues < 10% of the time  Function - Expression Expression: Verbal Expression assist level: Expresses complex 90% of the time/cues < 10% of the time  Function - Social Interaction Social Interaction assist level: Interacts appropriately 90% of the time - Needs monitoring or encouragement for participation or interaction.  Function - Problem Solving Problem solving  assist level: Solves basic 90% of the time/requires cueing < 10% of the time  Function - Memory Memory assist level: Recognizes or recalls 75 - 89% of the time/requires cueing 10 - 24% of the time Patient normally able to recall (first 3 days only): That he or she is in a hospital, Current season  Medical Problem List and Plan: 1.  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   -Continue CIR level PT, OT, speech 2.  DVT Prophylaxis/Anticoagulation: Subcutaneous Lovenox. Monitor platelet counts and any signs of bleeding 3. Pain Management: Tylenol as needed 4. Mood: Lexapro 10 mg daily 5. Neuropsych: This patient is capable of making decisions on her own behalf. 6. Skin/Wound Care: Routine skin checks 7. Fluids/Electrolytes/Nutrition: Routine I&O with follow-up electolytes normal  8. Hypertension. Norvasc 5 mg daily,. Monitor with increased  mobility Vitals:   05/25/16 0529 05/25/16 0750  BP: (!) 141/64 (!) 145/85  Pulse: 66 77  Resp: 18   Temp: 98.2 F (36.8 C)    9. Hyperlipidemia. Zetia 10 mg daily, Lipitor 80 mg daily 10. CAD with history of MI. Continue aspirin. 11. Tobacco abuse. Counseling 12. Sickle cell trait: no evidence of anemia 13. 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 Question whether patient is in and out of A. fib, loop recorder placed, follow-up as an outpatient for monitoring 14.  Hypoalbuminemia- started prostat  -encourage PO  LOS (Days) 3 A FACE TO FACE EVALUATION WAS PERFORMED  Giliana Vantil T 05/25/2016, 9:19 AM

## 2016-05-25 NOTE — Progress Notes (Signed)
Speech Language Pathology Daily Session Note  Patient Details  Name: Elizabeth [name] Strength MRN: [id number] Date of Birth: 08-26-1951  Today's Date: 05/25/2016 SLP Individual Time: 1015-1100 SLP Individual Time Calculation (min): 45 min   Short Term Goals: Week 1: SLP Short Term Goal 1 (Week 1): STGs=LTG's due to short length of stay anticipated   Skilled Therapeutic Interventions:   Skilled treatment session focused on addressing cognitive goals.  Patient ambulated to and from SLP office with extra time and Supervision level verbal cues for attention to left field of environment while path finding; patient able to verbalize need to attend to the left Independently.  Patient required intermittent repetition of directions to complete complex, novel problem solving tasks with Supervision level verbal cues to recognize and correct errors.  Patient recalled daily events Mod I with use of schedule and identified that recalling new things like her current medications will take time.  SLP provided verbal, teach back, and provided a handout for effective memory compensatory strategies for home management following discharge; patient verbalized understanding.  Patient fully intelligible throughout session at the conversation level.  Continue with current plan of care.   Function:  Cognition Comprehension Comprehension assist level: Follows basic conversation/direction with no assist  Expression   Expression assist level: Expresses complex ideas: With extra time/assistive device  Social Interaction Social Interaction assist level: Interacts appropriately with others with medication or extra time (anti-anxiety, antidepressant).  Problem Solving Problem solving assist level: Solves basic 90% of the time/requires cueing < 10% of the time;Solves complex 90% of the time/cues < 10% of the time  Memory Memory assist level: Recognizes or recalls 90% of the time/requires cueing < 10% of the time    Pain Pain  Assessment Pain Assessment: No/denies pain  Therapy/Group: Individual Therapy  Carmelia Roller., [place] [id number]  [place] 05/25/2016, 12:49 PM

## 2016-05-25 NOTE — Progress Notes (Signed)
Occupational Therapy Session Note  Patient Details  Name: Elizabeth [name] MRN: [id number] Date of Birth: [birth year]  Today's Date: 05/25/2016 OT Individual Time: 0900-1000 OT Individual Time Calculation (min): 60 min     Short Term Goals:Week 1:  OT Short Term Goal 1 (Week 1): STG = LTGs due to short ELOS  Skilled Therapeutic Interventions/Progress Updates:    Pt seen for skilled OT to facilitate dynamic balance, LUE coordination and attention to L side. Pt ambulated around room to gather clothing and then to shower with distant S.  She showered in standing with mod I using grab bar for support as she washed feet. Pt states she has a bar to hold onto at home. She completed all her dressing and grooming in a very efficient manner.  Pt ambulated to apartment to practice vacuuming with alternating R and L hands with good balance.  Pt ambulated to gym to work with [facility] tasks of small buttons and sewing activity. Pt was not having any difficulty using L hand.  Pt's spouse in rom and due to pt's progress with balance he was cleared to provide S as she walked around the room.  Pt in room with all needs met.   Therapy Documentation Precautions:  Precautions Precautions: Fall Precaution Comments: L visual field deficits Restrictions Weight Bearing Restrictions: No     Pain: Pain Assessment Pain Assessment: No/denies pain ADL:   See Function Navigator for Current Functional Status.   Therapy/Group: Individual Therapy  [place] 05/25/2016, 12:00 PM

## 2016-05-26 ENCOUNTER — Inpatient Hospital Stay (HOSPITAL_COMMUNITY): Payer: 59 | Admitting: Occupational Therapy

## 2016-05-26 ENCOUNTER — Inpatient Hospital Stay (HOSPITAL_COMMUNITY): Payer: 59 | Admitting: Physical Therapy

## 2016-05-26 ENCOUNTER — Inpatient Hospital Stay (HOSPITAL_COMMUNITY): Payer: Medicare Other | Admitting: Occupational Therapy

## 2016-05-26 NOTE — Progress Notes (Signed)
Subjective/Complaints: Up walking out of bathroom with OT. No new complaints except for her "dry toast"  Review of systems negative for chest pain, shortness of breath, nausea, vomiting, diarrhea or constipation  Objective: Vital Signs: Blood pressure 140/84, pulse 77, temperature 98.7 F (37.1 C), temperature source Oral, resp. rate 20, height 5\' 5"  (1.651 m), weight 59.3 kg (130 lb 11.2 oz), SpO2 100 %. No results found. No results found for this or any previous visit (from the past 72 hour(s)).   HEENT: normal Cardio: RRR and No murmur Resp: CTA B/L and Unlabored GI: BS positive and Nontender, nondistended Extremity:  Pulses positive and No Edema Skin:   Intact Neuro: Alert/Oriented, Normal Sensory.  Motor: remains 4/5 in the left deltoid, biceps, triceps, grip, hip flexor, knee extensor, ankle dorsal flexor. Decreased [place].  Visual fields are intact. Balance improving although still wide based Musc/Skel:  Normal Gen. no acute distress   Assessment/Plan: 1. 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 which require 3+ hours per day of interdisciplinary therapy in a comprehensive inpatient rehab setting. Physiatrist is providing close team supervision and 24 hour management of active medical problems listed below. Physiatrist and rehab team continue to assess barriers to discharge/monitor patient progress toward functional and medical goals. FIM: Function - Bathing Position: Shower Body parts bathed by patient: Right arm, Left arm, Chest, Abdomen, Front perineal area, Buttocks, Right upper leg, Left upper leg, Right lower leg, Left lower leg, Back Assist Level: No help, No cues  Function- Upper Body Dressing/Undressing What is the patient wearing?: Bra Bra - Perfomed by patient: Thread/unthread right  bra strap, Thread/unthread left bra strap, Hook/unhook bra (pull down sports bra) Pull over shirt/dress - Perfomed by patient: Thread/unthread right sleeve, Put head through opening, Thread/unthread left sleeve, Pull shirt over trunk Button up shirt - Perfomed by patient: Thread/unthread right sleeve, Thread/unthread left sleeve, Pull shirt around back Assist Level: No help, No cues Function - Lower Body Dressing/Undressing What is the patient wearing?: Underwear, Pants, Socks, Shoes Position: Standing at sink Underwear - Performed by patient: Thread/unthread right underwear leg, Thread/unthread left underwear leg, Pull underwear up/down Pants- Performed by patient: Thread/unthread right pants leg, Thread/unthread left pants leg, Pull pants up/down Socks - Performed by patient: Don/doff right sock, Don/doff left sock Shoes - Performed by patient: Don/doff right shoe, Don/doff left shoe Assist for footwear: Independent Assist for lower body dressing: No Help, No cues Set up : To obtain clothing/put away  Function - Toileting Toileting steps completed by patient: Adjust clothing prior to toileting, Performs perineal hygiene, Adjust clothing after toileting Toileting Assistive Devices: Grab bar or rail Assist level: No help/no cues  Function - Air cabin crew transfer assistive device: Grab bar Assist level to toilet: No Help, no cues, assistive device, takes more than a reasonable amount of time Assist level from toilet: Touching or steadying assistance (Pt > 75%)  Function - Chair/bed transfer Chair/bed transfer method: Ambulatory Chair/bed transfer assist level: No Help, no cues, assistive device, takes more than a reasonable amount of time  Function - Locomotion: Wheelchair Will patient use wheelchair at discharge?: No Function - Locomotion: Ambulation Assistive device: No device Max distance: 750ft  Assist level: Supervision or verbal cues Assist level: No help, No cues,  assistive device, takes more than a reasonable amount of time Assist level: No help, No cues, assistive device,  takes more than a reasonable amount of time Assist level: No help, No cues, assistive device, takes more than a reasonable amount of time Assist level: Supervision or verbal cues  Function - Comprehension Comprehension: Auditory Comprehension assist level: Follows basic conversation/direction with no assist  Function - Expression Expression: Verbal Expression assist level: Expresses complex ideas: With extra time/assistive device  Function - Social Interaction Social Interaction assist level: Interacts appropriately with others with medication or extra time (anti-anxiety, antidepressant).  Function - Problem Solving Problem solving assist level: Solves basic 90% of the time/requires cueing < 10% of the time  Function - Memory Memory assist level: Recognizes or recalls 90% of the time/requires cueing < 10% of the time Patient normally able to recall (first 3 days only): That he or she is in a hospital, Current season  Medical Problem List and Plan: 1.  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   -Continue CIR level PT, OT, speech  -for dc 9/4 2.  DVT Prophylaxis/Anticoagulation: Subcutaneous Lovenox. Monitor platelet counts and any signs of bleeding 3. Pain Management: Tylenol as needed 4. Mood: Lexapro 10 mg daily 5. Neuropsych: This patient is capable of making decisions on her own behalf. 6. Skin/Wound Care: Routine skin checks 7. Fluids/Electrolytes/Nutrition: Routine I&O with follow-up electolytes normal  8. Hypertension. Norvasc 5 mg daily,. Monitor with increased mobility Vitals:   05/25/16 1957 05/26/16 0525  BP: 138/75 140/84  Pulse: (!) 55 77  Resp:  20  Temp:  98.7 F (37.1 C)   9. Hyperlipidemia. 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 Continue aspirin. 11. Tobacco abuse. Counseling 12. Sickle cell trait: no evidence of anemia 13. 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 Question of whether patient is in and out of A. fib, loop recorder placed, follow-up as an outpatient for monitoring 14.  Hypoalbuminemia- started prostat  -encouraging PO  LOS (Days) 4 A FACE TO FACE EVALUATION WAS PERFORMED  Hiran Leard T 05/26/2016, 8:42 AM

## 2016-05-26 NOTE — Progress Notes (Addendum)
Occupational Therapy Session Note  Patient Details  Name: Elizabeth [name] MRN: [id number] Date of Birth: 05/15/1951  Today's Date: 05/26/2016 OT Individual Time:  - [id number]   (30 min)       Short Term Goals: Week 1:  OT Short Term Goal 1 (Week 1): STG = LTGs due to short ELOS Week 2:     Skilled Therapeutic Interventions/Progress Updates:    Pt seen for skilled OT to facilitate dynamic balance, LUE coordination and attention to L side. Pt ambulated around room to gather clothing and then to shower with mod I.    She showered in standing with mod I using grab bar for support as she washed feet  Pt gathered clothes but left in closet and forgot to bring to bathroom until cued.  Ppt completed BADL with mod I.  Husband present for session.   Therapy Documentation Precautions:  Precautions Precautions: Fall Precaution Comments: L visual field deficits Restrictions Weight Bearing Restrictions: No General:   Vital Signs: Therapy Vitals Temp: 98.7 F (37.1 C) Temp Source: Oral Pulse Rate: 77 Resp: 20 BP: 140/84 Patient Position (if appropriate): Lying Oxygen Therapy SpO2: 100 % O2 Device: Not Delivered Pain:  none          See Function Navigator for Current Functional Status.   Therapy/Group: Individual Therapy  Lisa Roca 05/26/2016, 8:08 AM

## 2016-05-26 NOTE — Progress Notes (Signed)
Physical Therapy Discharge Summary  Patient Details  Name: Elizabeth [name] MRN: [id number] Date of Birth: Dec 15, 1950  Today's Date: 05/26/2016 PT Individual Time: 5400-8676 PT Individual Time Calculation (min): 56 min     Patient has met 12 of 12 long term goals due to improved activity tolerance, improved balance, increased strength, ability to compensate for deficits, improved attention and improved awareness.  Patient to discharge at an ambulatory level Modified Independent.   Patient's husband is independent in providing assistance to pt upon d/c home.  Reasons goals not met: N/A  Recommendation:  Patient will benefit from ongoing skilled PT services in outpatient setting to continue to advance safe functional mobility, address ongoing impairments in endurance, high level gait & balance training, to improve pt's ability to attend to L, and minimize fall risk.  Equipment: No equipment provided  Reasons for discharge: treatment goals met  Patient/family agrees with progress made and goals achieved: Yes  Skilled PT Treatment: Pt received in bed & agreeable to PT, denying c/o pain. Pt ambulated throughout unit & outside without AD & mod I over even & uneven surfaces. Pt completed all transfers (sit<>stand, stand pivot, car, and floor) and bed mobility (rolling, supine<>sit) with mod I. Educated pt on scenarios in which she should call 9-1-1 after a fall. Pt able to negotiate 12 steps (6") with single rail & mod I. Administered TUG and Berg Balance Test; pt scored 52/56 on Berg Balance test & therapist educated pt on interpretation of score. Patient demonstrates increased fall risk as noted by score of 52/56 on Berg Balance Scale.  (<36= high risk for falls, close to 100%; 37-45 significant >80%; 46-51 moderate >50%; 52-55 lower >25%). Educated pt to remove all throw rugs/make clear paths to reduce her tripping hazards at home. At end of session pt left in room with all needs within reach &  husband present.   Pt continues to demonstrate decreased weight shift L, decreased LUE arm swing, and decreased LLE foot clearance/heel strike during gait.   PT Discharge Precautions/Restrictions Precautions Precautions: None  Pain Pain Assessment Pain Assessment: No/denies pain  Vision/Perception  Pt reports she wears glasses for reading at only at baseline. Pt with L visual field deficits following CVA but reports vision is slowly improving while in CIR.  Cognition Overall Cognitive Status: Within Functional Limits for tasks assessed Orientation Level: Oriented X4 Attention: Focused Sustained Attention Impairment: Verbal basic Awareness: Appears intact Safety/Judgment: Appears intact  Sensation Sensation Light Touch: Appears Intact Proprioception: Appears Intact (BLE) Coordination Gross Motor Movements are Fluid and Coordinated: Yes  Mobility Bed Mobility Bed Mobility: Rolling Left;Rolling Right;Sit to Supine;Supine to Sit (traditional bed) Rolling Right: 6: Modified independent (Device/Increase time) Rolling Left: 6: Modified independent (Device/Increase time) Supine to Sit: 6: Modified independent (Device/Increase time) Sit to Supine: 6: Modified independent (Device/Increase time) Transfers Transfers: Yes Sit to Stand: 6: Modified independent (Device/Increase time) Stand to Sit: 6: Modified independent (Device/Increase time) Stand Pivot Transfers: 6: Modified independent (Device/Increase time)  Locomotion  Ambulation Ambulation: Yes Ambulation/Gait Assistance: 6: Modified independent (Device/Increase time) Ambulation Distance (Feet): 200 Feet Assistive device: None Stairs / Additional Locomotion Stairs: Yes Stairs Assistance: 6: Modified independent (Device/Increase time) Stair Management Technique: One rail Right Number of Stairs: 12 Height of Stairs: 6 (inches) Ramp: 6: Modified independent (Device) Curb: 6: Modified independent (Device/increase time)  (with single UE support) Wheelchair Mobility Wheelchair Mobility: No   Balance Balance Balance Assessed: Yes Standardized Balance Assessment Standardized Balance Assessment: Berg Balance Test;Timed Up and Go  Test Berg Balance Test Sit to Stand: Able to stand using hands after several tries Standing Unsupported: Able to stand safely 2 minutes Sitting with Back Unsupported but Feet Supported on Floor or Stool: Able to sit safely and securely 2 minutes Stand to Sit: Sits safely with minimal use of hands Transfers: Able to transfer safely, minor use of hands Standing Unsupported with Eyes Closed: Able to stand 10 seconds safely Standing Ubsupported with Feet Together: Able to place feet together independently and stand 1 minute safely From Standing, Reach Forward with Outstretched Arm: Can reach confidently >25 cm (10") From Standing Position, Pick up Object from Floor: Able to pick up shoe safely and easily From Standing Position, Turn to Look Behind Over each Shoulder: Looks behind from both sides and weight shifts well Turn 360 Degrees: Able to turn 360 degrees safely in 4 seconds or less Standing Unsupported, Alternately Place Feet on Step/Stool: Able to stand independently and safely and complete 8 steps in 20 seconds (11 seconds) Standing Unsupported, One Foot in Front: Able to place foot tandem independently and hold 30 seconds Standing on One Leg: Able to lift leg independently and hold equal to or more than 3 seconds (4 seconds standing on RLE only) Total Score: 52 Timed Up and Go Test TUG: Normal TUG Normal TUG (seconds): 11   Extremity Assessment  RLE Assessment RLE Assessment: Exceptions to [facility] RLE AROM (degrees) Overall AROM Right Lower Extremity: Within functional limits for tasks assessed RLE Strength Right Hip Flexion: 4+/5 Right Knee Flexion: 4+/5 Right Knee Extension: 4+/5 Right Ankle Dorsiflexion: 4+/5 LLE Assessment LLE Assessment: Exceptions to WFL LLE AROM  (degrees) Overall AROM Left Lower Extremity: Within functional limits for tasks assessed LLE Strength Left Hip Flexion: 4-/5 Left Knee Flexion: 4/5 Left Knee Extension: 4/5 Left Ankle Dorsiflexion: 4/5   See Function Navigator for Current Functional Status.  Waunita Schooner 05/26/2016, 4:44 PM

## 2016-05-26 NOTE — Progress Notes (Signed)
Occupational Therapy Discharge Summary  Patient Details  Name: Elizabeth [name] MRN: [id number] Date of Birth: 1951/03/07  Today's Date: 05/26/2016 OT Individual Time: 1355-1440 OT Individual Time Calculation (min): 45 min    Patient has met 14 out of 14 long term goals due to improved activity tolerance, improved balance, ability to compensate for deficits, functional use of  LEFT upper extremity, improved awareness and improved coordination.  Patient to discharge at overall Modified Independent level.  Patient's care partner does not need to provide assistance at discharge.     Recommendation:  Patient will benefit from ongoing skilled OT services in outpatient setting to continue to advance functional skills in the area of iADL.  Equipment: No equipment provided  Reasons for discharge: treatment goals met  Patient/family agrees with progress made and goals achieved: Yes  Skilled OT Intervention:  Pt was seen in afternoon session focusing on L NMR, left visual field scanning, and safety during IADL completion. Pt completed simulated grocery shopping with grocery cart, stooping for items on low shelves and scanning in all fields to retrieve necessary items. Pt then returned grocery items to therapy apartment and stocked pantries and cupboards alternating between using L and R UE with L hand as dominant throughout task. Pt provided theraputty for home NMR with granddaughter who plays with clay. "We can do this together!" For remainder of session, pt completed graded ball throwing task with trampoline to improve dynamic standing balance and bilateral integration for return to meaningful role of taking care of grandchild at time of discharge. At end of session, pt was returned to room with all needs within reach. Mod I sign placed on door with pt education on now being able to ambulate in room and to bathroom by herself.  No c/o pain.   OT Discharge Precautions/Restrictions  Precautions Precautions: None Precaution Comments:  (L visual field scanning has improved since time of evaluation) Restrictions Weight Bearing Restrictions: No  Pain Pain Assessment Pain Assessment: No/denies pain ADL ADL ADL Comments: Please see functional navigator for details Vision/Perception  Vision- History Baseline Vision/History: Wears glasses Wears Glasses: Reading only Patient Visual Report:  (reports improvement in vision; L visual field deficits) Vision- Assessment Vision Assessment?: No apparent visual deficits  Cognition Overall Cognitive Status: Within Functional Limits for tasks assessed Arousal/Alertness: Awake/alert Orientation Level: Oriented X4 Attention: Focused Focused Attention: Appears intact Sustained Attention: Appears intact Sustained Attention Impairment: Verbal basic Awareness: Appears intact Problem Solving: Appears intact Safety/Judgment: Appears intact Sensation Sensation Light Touch: Appears Intact Stereognosis: Appears Intact Hot/Cold: Appears Intact Proprioception: Appears Intact Coordination Gross Motor Movements are Fluid and Coordinated: Yes Fine Motor Movements are Fluid and Coordinated: Yes Motor  Motor Motor: Within Functional Limits Mobility  Bed Mobility Bed Mobility: Rolling Left;Rolling Right;Sit to Supine;Supine to Sit (traditional bed) Rolling Right: 6: Modified independent (Device/Increase time) Rolling Left: 6: Modified independent (Device/Increase time) Supine to Sit: 6: Modified independent (Device/Increase time) Sit to Supine: 6: Modified independent (Device/Increase time) Transfers Sit to Stand: 6: Modified independent (Device/Increase time) Stand to Sit: 6: Modified independent (Device/Increase time)  Trunk/Postural Assessment  Cervical Assessment Cervical Assessment: Within Functional Limits Thoracic Assessment Thoracic Assessment: Within Functional Limits Lumbar Assessment Lumbar Assessment: Within  Functional Limits Postural Control Postural Control: Within Functional Limits (WFL balance reactions during dynamic stand task at time of discharge)  Balance Balance Balance Assessed: Yes Standardized Balance Assessment Standardized Balance Assessment: Berg Balance Test;Timed Up and Go Test Berg Balance Test Sit to Stand: Able to stand using hands after  several tries Standing Unsupported: Able to stand safely 2 minutes Sitting with Back Unsupported but Feet Supported on Floor or Stool: Able to sit safely and securely 2 minutes Stand to Sit: Sits safely with minimal use of hands Transfers: Able to transfer safely, minor use of hands Standing Unsupported with Eyes Closed: Able to stand 10 seconds safely Standing Ubsupported with Feet Together: Able to place feet together independently and stand 1 minute safely From Standing, Reach Forward with Outstretched Arm: Can reach confidently >25 cm (10") From Standing Position, Pick up Object from Floor: Able to pick up shoe safely and easily From Standing Position, Turn to Look Behind Over each Shoulder: Looks behind from both sides and weight shifts well Turn 360 Degrees: Able to turn 360 degrees safely in 4 seconds or less Standing Unsupported, Alternately Place Feet on Step/Stool: Able to stand independently and safely and complete 8 steps in 20 seconds (11 seconds) Standing Unsupported, One Foot in Front: Able to place foot tandem independently and hold 30 seconds Standing on One Leg: Able to lift leg independently and hold equal to or more than 3 seconds (4 seconds standing on RLE only) Total Score: 52 Timed Up and Go Test TUG: Normal TUG Normal TUG (seconds): 11 Static Sitting Balance Static Sitting - Level of Assistance: 6: Modified independent (Device/Increase time) Dynamic Sitting Balance Dynamic Sitting - Level of Assistance: 6: Modified independent (Device/Increase time) Static Standing Balance Static Standing - Level of Assistance:  6: Modified independent (Device/Increase time) Dynamic Standing Balance Dynamic Standing - Level of Assistance: 6: Modified independent (Device/Increase time) Extremity/Trunk Assessment RUE Assessment RUE Assessment: Within Functional Limits LUE Assessment LUE Assessment: Within Functional Limits (5/5 with mild coordination deficits at time of discharge)   See Function Navigator for Current Functional Status.  Lavonya Hoerner A Laconda Basich 05/26/2016, 4:44 PM

## 2016-05-26 NOTE — Progress Notes (Signed)
Occupational Therapy Session Note  Patient Details  Name: Elizabeth [name] MRN: [id number] Date of Birth: May 15, 1951  Today's Date: 05/26/2016 OT Individual Time:1003-1101 OT Individual Time Calculation (min): 35      Short Term Goals: Week 1:  OT Short Term Goal 1 (Week 1): STG = LTGs due to short ELOS  Skilled Therapeutic Interventions/Progress Updates:   Pt participated in skilled OT session focusing on d/c planning, L UE NMR, and safety during IADL task completion on grad day. Pt was agreeable to complete various IADL tasks in kitchen including oven involved meal prep, snack prep, washing dishes/wiping down countertops and sweeping floor while stooping for dustpan. Pt used L UE as dominant UE without cues, was able to scan to left during tasks to problem solve and collect necessary items without cues. Mod I level with safety and attentiveness to task demands. Simulated laundry task completed with pt carrying laundry basket with clothes down hallway with good balance. Mod I for loading/unloading washer and dryer. No further DME needs. Pt educated on outpatient OT with verbalized understanding. Pt ambulated back to room without AD and husband present with all needs within reach while seated at EOB.   Therapy Documentation Precautions:  Precautions Precautions: None Precaution Comments: L visual field deficits Restrictions Weight Bearing Restrictions: No General:   Vital Signs:  Pain: No c/o pain  Pain Assessment Pain Assessment: No/denies pain       See Function Navigator for Current Functional Status.   Therapy/Group: Individual Therapy  Elizabeth [name] A [name] 05/26/2016, 4:30 PM

## 2016-05-26 NOTE — Plan of Care (Signed)
Problem: RH Ambulation Goal: LTG Patient will ambulate in controlled environment (PT) LTG: Patient will ambulate in a controlled environment, # of feet with assistance (PT).  Outcome: Completed/Met Date Met: 05/26/16 >150 ft without AD Goal: LTG Patient will ambulate in home environment (PT) LTG: Patient will ambulate in home environment, # of feet with assistance (PT).  Outcome: Completed/Met Date Met: 05/26/16 75 ft without AD Goal: LTG Patient will ambulate in community environment (PT) LTG: Patient will ambulate in community environment, # of feet with assistance (PT).  Outcome: Completed/Met Date Met: 05/26/16 200 ft without AD  Problem: RH Stairs Goal: LTG Patient will ambulate up and down stairs w/assist (PT) LTG: Patient will ambulate up and down # of stairs with assistance (PT)  Outcome: Completed/Met Date Met: 05/26/16 12 steps with 1 rail  Problem: RH Vision Goal: RH LTG Vision (Specify) Outcome: Completed/Met Date Met: 05/26/16 Pt with ability to attend to L visual field during functional tasks such as ambulation, stair negotiation, transfers.

## 2016-05-27 MED ORDER — IRBESARTAN 300 MG PO TABS: 300.0000 mg | ORAL_TABLET | Freq: Every day | ORAL | 0 refills | Status: DC

## 2016-05-27 MED ORDER — ESCITALOPRAM OXALATE 10 MG PO TABS: 10.0000 mg | ORAL_TABLET | Freq: Every day | ORAL | 0 refills | Status: DC

## 2016-05-27 MED ORDER — EZETIMIBE 10 MG PO TABS: 10.0000 mg | ORAL_TABLET | Freq: Every day | ORAL | 0 refills | Status: DC

## 2016-05-27 MED ORDER — NITROGLYCERIN 0.4 MG/SPRAY TL SOLN
1.0000 | 0 refills | Status: DC | PRN
Start: 2016-05-27 — End: 2017-06-05

## 2016-05-27 MED ORDER — METOPROLOL TARTRATE 50 MG PO TABS: 50.0000 mg | ORAL_TABLET | Freq: Two times a day (BID) | ORAL | 0 refills | Status: DC

## 2016-05-27 MED ORDER — ATORVASTATIN CALCIUM 80 MG PO TABS: 80.0000 mg | ORAL_TABLET | Freq: Every day | ORAL | 0 refills | Status: DC

## 2016-05-27 MED ORDER — AMLODIPINE BESYLATE 5 MG PO TABS: 5.0000 mg | ORAL_TABLET | Freq: Every day | ORAL | 0 refills | Status: DC

## 2016-05-27 NOTE — Progress Notes (Signed)
Speech Language Pathology Discharge Summary  Patient Details  Name: Elizabeth [name] MRN: [id number] Date of Birth: Oct 30, 1950  Patient has met 4 of 4 long term goals.  Patient to discharge at overall Supervision level.   Reasons goals not met: N/A   Clinical Impression/Discharge Summary: Patient has made excellent gains and has met 4 of 4 LTG's this admission due to improved cognitive function and speech intelligibility. Currently, patient requires overall supervision to complete functional and mildly complex tasks accurately in regards to problem solving, recall and awareness. Patient is also 100% intelligible at the conversation level with Mod I. Patient education is complete and patient will discharge home with assistance from family. Patient would benefit from f/u SLP services to maximize cognitive function and reduce caregiver burden.    Care Partner:  Caregiver Able to Provide Assistance: Yes  Type of Caregiver Assistance: Physical;Cognitive  Recommendation:  24 hour supervision/assistance;Outpatient SLP  Rationale for SLP Follow Up: Maximize cognitive function and independence;Reduce caregiver burden   Equipment: N/A   Reasons for discharge: Discharged from hospital;Treatment goals met   Patient/Family Agrees with Progress Made and Goals Achieved: Yes       Mailani Degroote 05/27/2016, 7:00 AM

## 2016-05-27 NOTE — Progress Notes (Signed)
Pt discharged to home in care of husband. VSS, denies any pain/discomfort.  Verbalizes understanding of discharge instructions given by Love, PAC.

## 2016-05-27 NOTE — Progress Notes (Signed)
Subjective/Complaints: No issues overnite, feels ready for d/c  Review of systems negative for chest pain, shortness of breath, nausea, vomiting, diarrhea or constipation  Objective: Vital Signs: Blood pressure 132/78, pulse (!) 59, temperature 98.6 F (37 C), temperature source Oral, resp. rate 18, height 5\' 5"  (1.651 m), weight 59.3 kg (130 lb 11.2 oz), SpO2 96 %. No results found. No results found for this or any previous visit (from the past 72 hour(s)).   HEENT: normal Cardio: RRR and No murmur Resp: CTA B/L and Unlabored GI: BS positive and Nontender, nondistended Extremity:  Pulses positive and No Edema Skin:   Intact Neuro: Alert/Oriented, Normal Sensory.  Motor: remains 4/5 in the left deltoid, biceps, triceps, grip, hip flexor, knee extensor, ankle dorsal flexor. Decreased [place].  Visual fields are intact. Balance improving although still wide based Musc/Skel:  Normal Gen. no acute distress   Assessment/Plan: 1. 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 Stable for D/C today F/u PCP in 3-4 weeks F/u PM&R 2 weeks See D/C summary See D/C instructions FIM: Function - Bathing Position: Shower Body parts bathed by patient: Right arm, Left arm, Chest, Abdomen, Front perineal area, Buttocks, Right upper leg, Left upper leg, Right lower leg, Left lower leg, Back Assist Level: No help, No cues  Function- Upper Body Dressing/Undressing What is the patient wearing?: Bra Bra - Perfomed by patient: Thread/unthread right bra strap, Thread/unthread left bra strap, Hook/unhook bra (pull down sports bra) Pull over shirt/dress - Perfomed by patient: Thread/unthread right sleeve, Put head through opening, Thread/unthread left sleeve, Pull shirt over trunk Button up shirt - Perfomed by patient:  Thread/unthread right sleeve, Thread/unthread left sleeve, Pull shirt around back Assist Level: No help, No cues Function - Lower Body Dressing/Undressing What is the patient wearing?: Underwear, Pants, Socks, Shoes Position: Standing at sink Underwear - Performed by patient: Thread/unthread right underwear leg, Thread/unthread left underwear leg, Pull underwear up/down Pants- Performed by patient: Thread/unthread right pants leg, Thread/unthread left pants leg, Pull pants up/down Socks - Performed by patient: Don/doff right sock, Don/doff left sock Shoes - Performed by patient: Don/doff right shoe, Don/doff left shoe Assist for footwear: Independent Assist for lower body dressing: No Help, No cues Set up : To obtain clothing/put away  Function - Toileting Toileting steps completed by patient: Adjust clothing prior to toileting, Performs perineal hygiene, Adjust clothing after toileting Toileting Assistive Devices: Grab bar or rail Assist level: No help/no cues  Function - Air cabin crew transfer assistive device: Grab bar Assist level to toilet: No Help, no cues, assistive device, takes more than a reasonable amount of time Assist level from toilet: No Help, no cues, assistive device, takes more than a reasonable amount of time  Function - Chair/bed transfer Chair/bed transfer method: Ambulatory Chair/bed transfer assist level: No Help, no cues, assistive device, takes more than a reasonable amount of time  Function - Locomotion: Wheelchair Will patient use wheelchair at discharge?: No Wheelchair activity did not occur: N/A Wheel 50 feet with 2 turns activity did not occur: N/A Wheel 150 feet activity did not occur: N/A Turns around,maneuvers to table,bed, and toilet,negotiates 3% grade,maneuvers on rugs and over doorsills: No Function - Locomotion: Ambulation Assistive device: No device Max distance: >300 ft Assist level: No help, No cues, assistive device, takes more  than a reasonable amount of time Assist level: No help,  No cues, assistive device, takes more than a reasonable amount of time Assist level: No help, No cues, assistive device, takes more than a reasonable amount of time Assist level: No help, No cues, assistive device, takes more than a reasonable amount of time Assist level: No help, No cues, assistive device, takes more than a reasonable amount of time  Function - Comprehension Comprehension: Auditory Comprehension assist level: Follows complex conversation/direction with extra time/assistive device  Function - Expression Expression: Verbal Expression assist level: Expresses complex ideas: With extra time/assistive device  Function - Social Interaction Social Interaction assist level: Interacts appropriately with others with medication or extra time (anti-anxiety, antidepressant).  Function - Problem Solving Problem solving assist level: Solves complex problems: With extra time  Function - Memory Memory assist level: Recognizes or recalls 90% of the time/requires cueing < 10% of the time Patient normally able to recall (first 3 days only): Current season, That he or she is in a hospital  Medical Problem List and Plan: 1.  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   Transition to OP PT, OT, speech   2.  DVT Prophylaxis/Anticoagulation: Subcutaneous Lovenox. Monitor platelet counts and any signs of bleeding 3. Pain Management: Tylenol as needed 4. Mood: Lexapro 10 mg daily 5. Neuropsych: This patient is capable of making decisions on her own behalf. 6. Skin/Wound Care: Routine skin checks 7. Fluids/Electrolytes/Nutrition: Routine I&O with follow-up electolytes normal  8. Hypertension. Norvasc 5 mg daily,. Monitor with increased mobility Vitals:   05/26/16 1209 05/27/16 0459  BP:  122/66 132/78  Pulse: (!) 54 (!) 59  Resp: 18 18  Temp: 98.6 F (37 C) 98.6 F (37 C)   9. Hyperlipidemia. Zetia 10 mg daily, Lipitor 80 mg daily 10. CAD with history of MI. Continue aspirin. 11. Tobacco abuse. Counseling 12. Sickle cell trait: no evidence of anemia 13. 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 Question of whether patient is in and out of A. fib, loop recorder placed, follow-up as an outpatient for monitoring 14.  Hypoalbuminemia- started prostat  -encouraging PO  LOS (Days) 5 A FACE TO FACE EVALUATION WAS PERFORMED  KIRSTEINS,ANDREW E 05/27/2016, 7:34 AM

## 2016-05-27 NOTE — Progress Notes (Signed)
Social Work  Discharge Note  The overall goal for the admission was met for:   Discharge location: Yes-HOME WITH HUSBAND WHO IS TAKING SOME TIME OFF FORM WORK TO BE WITH HER.  Length of Stay: Yes-5 DAYS  Discharge activity level: Yes-MOD/I LEVEL  Home/community participation: Yes  Services provided included: MD, RD, PT, OT, SLP, RN, CM, TR, Pharmacy and SW  Financial Services: Medicare and Private Insurance: [facility]  Follow-up services arranged: Outpatient: [facility]-PT,OT,SP 9/5 2:00-2;45-OT, 9/7 8:00-8:45-PT AND 9/11 12:30-1:15-SP CANCELLED SP PT AND SP FEEL NO LONGER NEEDED.  Comments (or additional information):PT REACHED GOALS QUICKLY AND WAS MOD/I LEVEL BY DISCHARGE. HUSBAND HAD FMLA PAPERS COMPLETED AND PLANS TO BE WITH SHORT TIME FOR THE TRANSITION FROM HOSPITAL TO HOME. SET HER BACK UP WITH PCP AND STRESSED THE IMPORTANCE OF GOING FOR FOLLOW UP.  Patient/Family verbalized understanding of follow-up arrangements: Yes  Individual responsible for coordination of the follow-up plan:   Confirmed correct DME delivered: Elease Hashimoto 05/27/2016    Elease Hashimoto

## 2016-05-27 NOTE — Discharge Instructions (Signed)
Inpatient Rehab Discharge Instructions  Elizabeth [name] Discharge date and time: 09/ 4/17  Activities/Precautions/ Functional Status: Activity: no lifting, driving, or strenuous exercise for till cleared by MD.  Diet: cardiac diet Wound Care: none needed    Functional status:  ___ No restrictions     ___ Walk up steps independently _X__ 24/7 supervision/assistance   ___ Walk up steps with assistance ___ Intermittent supervision/assistance  ___ Bathe/dress independently ___ Walk with walker     _X__ Bathe/dress with assistance ___ Walk Independently    ___ Shower independently ___ Walk with assistance    ___ Shower with assistance _X__ No alcohol     ___ Return to work/school ________   Special Instructions:    COMMUNITY REFERRALS UPON DISCHARGE:    Outpatient: PT & OT  Agency:[facility] [phone]   Date of Last Service:05/27/2016  Appointment Date/Time:9/5-Tuesday 1;30-2:45-OT AND  9/7-Thursday 8:00-8:45-PT    Medical Equipment/Items Ordered:NO NEEDED EQUIPMENT   GENERAL COMMUNITY RESOURCES FOR PATIENT/FAMILY: Support Groups:[facility] SUPPORT GROUP EVERY SECOND Thursday @ 3:00-4:00 PM ON THE REHAB UNIT QUESTIONS CONTACT KATIE  [id number]   STROKE/TIA DISCHARGE INSTRUCTIONS SMOKING Cigarette smoking nearly doubles your risk of having a stroke & is the single most alterable risk factor  If you smoke or have smoked in the last 12 months, you are advised to quit smoking for your health.  Most of the excess cardiovascular risk related to smoking disappears within a year of stopping.  Ask you doctor about anti-smoking medications  [place] Quit Line: 1-800-QUIT NOW  Free Smoking Cessation Classes [phone]  CHOLESTEROL Know your levels; limit fat & cholesterol in your diet  Lipid Panel     Component Value Date/Time   CHOL 248 (H) 05/20/2016 0417   TRIG 145 05/20/2016 0417   HDL 54 05/20/2016 0417   CHOLHDL 4.6 05/20/2016 0417   VLDL 29 05/20/2016 0417   LDLCALC 165 (H) 05/20/2016 0417      Many patients benefit from treatment even if their cholesterol is at goal.  Goal: Total Cholesterol (CHOL) less than 160  Goal:  Triglycerides (TRIG) less than 150  Goal:  HDL greater than 40  Goal:  LDL (LDLCALC) less than 100   BLOOD PRESSURE American Stroke Association blood pressure target is less that 120/80 mm/Hg  Your discharge blood pressure is:  BP: 132/78  Monitor your blood pressure  Limit your salt and alcohol intake  Many individuals will require more than one medication for high blood pressure  DIABETES (A1c is a blood sugar average for last 3 months) Goal HGBA1c is under 7% (HBGA1c is blood sugar average for last 3 months)  Diabetes:     Lab Results  Component Value Date   HGBA1C 5.5 05/20/2016     Your HGBA1c can be lowered with medications, healthy diet, and exercise.  Check your blood sugar as directed by your physician  Call your physician if you experience unexplained or low blood sugars.  PHYSICAL ACTIVITY/REHABILITATION Goal is 30 minutes at least 4 days per week  Activity: No driving, Therapies: see above Return to work: N/A  Activity decreases your risk of heart attack and stroke and makes your heart stronger.  It helps control your weight and blood pressure; helps you relax and can improve your mood.  Participate in a regular exercise program.  Talk with your doctor about the best form of exercise for you (dancing, walking, swimming, cycling).  DIET/WEIGHT Goal is to maintain a healthy weight  Your discharge diet is:  Diet Heart Room service appropriate? Yes; Fluid consistency: Thin  liquids Your height is:  Height: 5\' 5"  (165.1 cm) Your current weight is: Weight: 59.3 kg (130 lb 11.2 oz) Your Body Mass Index (BMI) is:  BMI (Calculated): 21.8  Following the type of diet specifically designed for you will help prevent another stroke.  You are at goal weight.  Your goal Body Mass Index (BMI) is  19-24.  Healthy food habits can help reduce 3 risk factors for stroke:  High cholesterol, hypertension, and excess weight.  RESOURCES Stroke/Support Group:  Call [phone]   STROKE EDUCATION PROVIDED/REVIEWED AND GIVEN TO PATIENT Stroke warning signs and symptoms How to activate emergency medical system (call 911). Medications prescribed at discharge. Need for follow-up after discharge. Personal risk factors for stroke. Pneumonia vaccine given:  Flu vaccine given:  My questions have been answered, the writing is legible, and I understand these instructions.  I will adhere to these goals & educational materials that have been provided to me after my discharge from the hospital.     My questions have been answered and I understand these instructions. I will adhere to these goals and the provided educational materials after my discharge from the hospital.  Patient/Caregiver Signature _______________________________ Date __________  Clinician Signature _______________________________________ Date __________  Please bring this form and your medication list with you to all your follow-up doctor's appointments.

## 2016-05-28 ENCOUNTER — Ambulatory Visit: Payer: 59 | Attending: Physical Medicine & Rehabilitation | Admitting: Occupational Therapy

## 2016-05-28 DIAGNOSIS — R2689 Other abnormalities of gait and mobility: Secondary | ICD-10-CM | POA: Diagnosis not present

## 2016-05-28 DIAGNOSIS — R278 Other lack of coordination: Secondary | ICD-10-CM | POA: Insufficient documentation

## 2016-05-28 DIAGNOSIS — R42 Dizziness and giddiness: Secondary | ICD-10-CM | POA: Diagnosis not present

## 2016-05-28 DIAGNOSIS — I69352 Hemiplegia and hemiparesis following cerebral infarction affecting left dominant side: Secondary | ICD-10-CM | POA: Diagnosis not present

## 2016-05-28 DIAGNOSIS — R2681 Unsteadiness on feet: Secondary | ICD-10-CM | POA: Insufficient documentation

## 2016-05-28 NOTE — Patient Instructions (Addendum)
   Coordination/Hand Activities:  - [place] coins one at a time - Shuffle cards - Flip playing cards over 1 at a time - Squeeze putty with your whole hand - Roll out putty and then pinch with each finger and your thumb - Toss and catch ball

## 2016-05-28 NOTE — Discharge Summary (Signed)
Discharge summary job [id number]

## 2016-05-28 NOTE — Therapy (Signed)
[facility] [address] [place], [place], [address] Phone: [phone]   Fax:  [phone]  Occupational Therapy Evaluation  Patient Details  Name: Elizabeth [name] MRN: [id number] Date of Birth: 06-29-1951 Referring Provider: Dr. Alysia Penna  Encounter Date: 05/28/2016      OT End of Session - 05/28/16 1522    Visit Number 1   Number of Visits 17   Date for OT Re-Evaluation 07/27/16   Authorization Type UHC, Medicare (g-code needed)   OT Start Time 1403   OT Stop Time 1444   OT Time Calculation (min) 41 min   Activity Tolerance Patient tolerated treatment well   Behavior During Therapy [facility] for tasks assessed/performed      Past Medical History:  Diagnosis Date  . Abrasion or friction burn of foot and toe(s), with infection   . Allergy    soy  . Anxiety   . 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 Degenerative joint disease    Generalized  . Hyperlipidemia   . Hypertension   . Myocardial infarction ([place])   . Sickle cell trait ([place])   . Tobacco abuse April, 2011   Continued    Past Surgical History:  Procedure Laterality Date  . ANGIOPLASTY  2006   x3 stents  . DILATION AND CURETTAGE OF UTERUS    . EP IMPLANTABLE DEVICE N/A 05/21/2016   Procedure: Loop Recorder Insertion;  Surgeon: Thompson Grayer, MD;  Location: [facility];  Service: Cardiovascular;  Laterality: N/A;  . FOOT SURGERY  2000  . LITHOTRIPSY  2008   left kidney stone and also ureteral stent placed  . TEE WITHOUT CARDIOVERSION N/A 05/21/2016   Procedure: TRANSESOPHAGEAL ECHOCARDIOGRAM (TEE);  Surgeon: Lelon Perla, MD;  Location: [facility] ENDOSCOPY;  Service: Cardiovascular;  Laterality: N/A;    There were no vitals filed for this visit.      Subjective Assessment - 05/28/16 1408    Subjective  "Each day gets better and better"    Patient is accompained by: Family member  husband   Pertinent History hx of falls, hx of MI x3, hx of chronic R shoulder pain, degenerative joint disease, HTN, loop recorder placement   Patient Stated Goals "to get back to normal"   Currently in Pain? No/denies           [facility] OT Assessment - 05/28/16 0001      Assessment   Diagnosis R CVA   Referring Provider Dr. Alysia Penna   Onset Date 06/19/16   Prior Therapy hospitalized 06/19/16-05/27/16 with CIR     Precautions   Precautions --  no driving, no heavy lifting     Balance Screen   Has the patient fallen in the past 6 months Yes   How many times? 2  standing on chair, getting out of the [place] expects to be discharged to: Private residence   Home Layout One level   Lives With Spouse     Prior Function   Level of Independence Independent with household mobility without device;Independent;Independent with basic ADLs   Vocation Retired   Leisure childcare for friends, alterations  shopping, walking, driving     ADL   ADL comments Pt performing BADLs mod I; has to carry coffee cup with 2 hands and spills with L hand  IADL   Prior Level of Function Shopping pt performed   Shopping Assistance for transportation  has not attempted   Prior Level of Function Light Housekeeping pt performed   Light Housekeeping --  has not attempted yet   Prior Level of Function Meal Prep pt performed   Meal Prep --  has not yet attempted except made coffee   Prior Level of Function Community Mobility drove independently   [place] on family or friends for transportation   Prior Level of Function Meal Prep independent   Medication Management Is responsible for taking medication in correct dosages at correct time   Prior Level of Function Financial Management husband performed previous      Mobility   Mobility Status Independent  hx of falls with high level balance challenges      Written Expression   Dominant Hand Left   Handwriting Increased time;100% legible     Cognition   Overall Cognitive Status Within Functional Limits for tasks assessed  pt reports that she feels that her thinking is slower   Area of Impairment --  will assess further in functional context prn     Sensation   Light Touch Appears Intact  per pt report     Coordination   9 Hole Peg Test Right;Left   Right 9 Hole Peg Test 21.19   Left 9 Hole Peg Test 26.12     ROM / Strength   AROM / PROM / Strength AROM;Strength     AROM   Overall AROM  Within functional limits for tasks performed  BUEs     Strength   Overall Strength Deficits   Overall Strength Comments Proximal UE strength:  RUE grossly 5/5 with mild pain R shoulder and LUE grossly 4-/5     Hand Function   Right Hand Grip (lbs) 52   Left Hand Grip (lbs) 35                         OT Education - 05/28/16 1741    Education provided Yes   Education Details OT POC and eval results; Initial hand HEP; importance of using LUE as much as possible   Person(s) Educated Patient;Spouse   Methods Explanation;Verbal cues;Handout   Comprehension Verbalized understanding;Need further instruction          OT Short Term Goals - 05/28/16 1727      OT SHORT TERM GOAL #1   Title Pt will be independent with inital HEP for LUE strength and coordination.--check STGs    Time 4   Period Weeks   Status New     OT SHORT TERM GOAL #2   Title Pt will improve coordination for ADLs as shown by improving time on 9-hole peg test by at least 5sec.   Baseline 26.12sec   Time 4   Period Weeks   Status New     OT SHORT TERM GOAL #3   Title Pt will improve L grip strength by at least 6lbs for ADLs and lifting tasks.   Baseline R-52lbs, L-35lbs   Time 4   Period Weeks   Status New     OT SHORT TERM GOAL #4   Title Pt will be able to carry cup with water at least 200 feet without spills.   Time 4   Period Weeks    Status New     OT SHORT TERM GOAL #5   Title --   Time --  Period --   Status --           OT Long Term Goals - 13-Jun-2016 1735      OT LONG TERM GOAL #1   Title Pt will be independent with updated HEP--check LTGs 07/27/16   Time 8   Period Weeks   Status New     OT LONG TERM GOAL #2   Title Pt will be able to retrieve 3lb object from overhead safely with dominant LUE.   Time 8   Period Weeks   Status New     OT LONG TERM GOAL #3   Title Pt will improve L grip strength by at least 12lbs for ADLs and lifting tasks.   Baseline R-52lbs, L-35lbs   Time 8   Period Weeks   Status New     OT LONG TERM GOAL #4   Title Pt will be able to complete mod complex cooking and home maintenance tasks mod I.   Time 8   Period Weeks   Status New               Plan - 13-Jun-2016 1525    Clinical Impression Statement Pt is a 65 y.o. female s/p R CVA with L hemiparesis.  Pt with PMH that includes hx of falls, hx of MI x3, hx of chronic R shoulder pain, degenerative joint disease, HTN, loop recorder placement.  Pt presents with L dominant side hemiparesis, decr coordination, decr strength, and reports mild cognitive slowing and mild incr blurriness, as well as decr balance for ADLs.  Pt would benefit from occupational therapy to address these deficits to incr L dominant UE functional use and incr ADL/IADL performance/independence.   Rehab Potential Good   OT Frequency 2x / week   OT Duration 8 weeks  +eval (but may only need 6 weeks depending on progress)   OT Treatment/Interventions Self-care/ADL training;Therapeutic exercise;DME and/or AE instruction;Parrafin;Cryotherapy;Therapist, nutritional;Therapeutic activities;Patient/family education;Balance training;Cognitive remediation/compensation;Splinting;Manual Therapy;Neuromuscular education;Fluidtherapy;Ultrasound;Moist Heat;Energy conservation;Passive range of motion;Therapeutic exercises;Visual/perceptual remediation/compensation    Plan low range theraband HEP, reinforce/add to hand HEP   OT Home Exercise Plan education provided:  initial hand HEP   Recommended Other Services scheduled for PT, ST evals   Consulted and Agree with Plan of Care Patient      Patient will benefit from skilled therapeutic intervention in order to improve the following deficits and impairments:  Decreased activity tolerance, Decreased coordination, Decreased strength, Impaired UE functional use, Decreased cognition, Decreased balance, Decreased mobility, Impaired vision/preception  Visit Diagnosis: Hemiplegia and hemiparesis following cerebral infarction affecting left dominant side (HCC)  Other lack of coordination      G-Codes - 2016-06-13 1740    Functional Assessment Tool Used 9-hole peg test:  L 26.12sec.  L grip strength 35lbs    Functional Limitation Carrying, moving and handling objects   Carrying, Moving and Handling Objects Current Status [phone]) At least 20 percent but less than 40 percent impaired, limited or restricted   Carrying, Moving and Handling Objects Goal Status [id number]) At least 1 percent but less than 20 percent impaired, limited or restricted      Problem List Patient Active Problem List   Diagnosis Date Noted  . 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 Facial droop   . Adjustment disorder with anxious mood   . Acute CVA (cerebrovascular accident) ([place]) 05/20/2016  . Slurred speech   . Dysarthria, post-stroke   . Sickle cell trait ([place])   . Acute embolic stroke ([place]) [id number]  .  Depression   . Cervical radiculitis 05/17/2013  . Right shoulder pain 11/16/2012  . Anxiety 04/02/2011  . Preventative health care 03/31/2011  . CAD (coronary artery disease)   . Benign essential HTN   . Hyperlipidemia   . Degenerative joint disease   . Tobacco abuse 12/22/2009    [facility] 05/28/2016, 5:44 PM  [facility] [address] [place], [place],  [address] Phone: [phone]   Fax:  [phone]  Name: Elizabeth [name] MRN: [id number] Date of Birth: [birth year]   Vianne Bulls, OTR/L [facility] [address] [place], [place]  [address] [phone] phone [phone] 05/28/16 5:44 PM

## 2016-05-29 ENCOUNTER — Ambulatory Visit (INDEPENDENT_AMBULATORY_CARE_PROVIDER_SITE_OTHER): Payer: 59 | Admitting: Internal Medicine

## 2016-05-29 ENCOUNTER — Encounter: Payer: Self-pay | Admitting: Internal Medicine

## 2016-05-29 ENCOUNTER — Other Ambulatory Visit (INDEPENDENT_AMBULATORY_CARE_PROVIDER_SITE_OTHER): Payer: 59

## 2016-05-29 VITALS — BP 118/68 | HR 61 | Temp 98.3°F | Resp 20 | Wt 133.0 lb

## 2016-05-29 DIAGNOSIS — Z0001 Encounter for general adult medical examination with abnormal findings: Secondary | ICD-10-CM | POA: Diagnosis not present

## 2016-05-29 DIAGNOSIS — R6889 Other general symptoms and signs: Secondary | ICD-10-CM

## 2016-05-29 DIAGNOSIS — I639 Cerebral infarction, unspecified: Secondary | ICD-10-CM

## 2016-05-29 DIAGNOSIS — Z1159 Encounter for screening for other viral diseases: Secondary | ICD-10-CM

## 2016-05-29 DIAGNOSIS — I6350 Cerebral infarction due to unspecified occlusion or stenosis of unspecified cerebral artery: Secondary | ICD-10-CM | POA: Diagnosis not present

## 2016-05-29 DIAGNOSIS — F329 Major depressive disorder, single episode, unspecified: Secondary | ICD-10-CM | POA: Diagnosis not present

## 2016-05-29 DIAGNOSIS — I10 Essential (primary) hypertension: Secondary | ICD-10-CM

## 2016-05-29 DIAGNOSIS — F32A Depression, unspecified: Secondary | ICD-10-CM

## 2016-05-29 DIAGNOSIS — E785 Hyperlipidemia, unspecified: Secondary | ICD-10-CM

## 2016-05-29 LAB — URINALYSIS, ROUTINE W REFLEX MICROSCOPIC
BILIRUBIN URINE: NEGATIVE
HGB URINE DIPSTICK: NEGATIVE
KETONES UR: NEGATIVE
LEUKOCYTES UA: NEGATIVE
NITRITE: NEGATIVE
PH: 5.5 (ref 5.0–8.0)
Specific Gravity, Urine: 1.02 (ref 1.000–1.030)
Total Protein, Urine: NEGATIVE
Urine Glucose: NEGATIVE
Urobilinogen, UA: 0.2 (ref 0.0–1.0)

## 2016-05-29 LAB — TSH: TSH: 1.89 u[IU]/mL (ref 0.35–4.50)

## 2016-05-29 MED ORDER — AMLODIPINE BESYLATE 2.5 MG PO TABS: 2.5000 mg | ORAL_TABLET | Freq: Every day | ORAL | 3 refills | Status: DC

## 2016-05-29 NOTE — Assessment & Plan Note (Signed)
D/w pt, mild, to cont lexapro asd

## 2016-05-29 NOTE — Patient Instructions (Addendum)
You had the flu shot today  OK to stop the amlodipine 5 mg tab, and start the amlodipine 2.5 mg lower dose   Please continue all other medications as before, and refills have been done if requested.  Please have the pharmacy call with any other refills you may need.  Please continue your efforts at being more active, low cholesterol diet, and weight control.  You are otherwise up to date with prevention measures today.  Please keep your appointments with your specialists as you may have planned  Please go to the LAB in the Basement (turn left off the elevator) for the tests to be done today  You will be contacted by phone if any changes need to be made immediately.  Otherwise, you will receive a letter about your results with an explanation, but please check with MyChart first.  Please remember to sign up for MyChart if you have not done so, as this will be important to you in the future with finding out test results, communicating by private email, and scheduling acute appointments online when needed.  Please return in 6 months, or sooner if needed

## 2016-05-29 NOTE — Progress Notes (Signed)
Pre visit review using our clinic review tool, if applicable. No additional management support is needed unless otherwise documented below in the visit note. 

## 2016-05-29 NOTE — Discharge Summary (Signed)
NAMELOUANN, Elizabeth [name] NO.:  [id number]  MEDICAL RECORD NO.:  [id number]  LOCATION:  4M08C                        FACILITY:  [facility]  PHYSICIAN:  Charlett Blake, M.D.DATE OF BIRTH:  [birth year]  DATE OF ADMISSION:  05/22/2016 DATE OF DISCHARGE:  05/27/2016                              DISCHARGE SUMMARY   DISCHARGE DIAGNOSES: 1. Right anterior paramedian pontine, right posterior limb internal     capsule, right mid corona radiata infarct. 2. Subcutaneous Lovenox for deep venous thrombosis prophylaxis. 3. Depression. 4. Hypertension. 5. Hyperlipidemia. 6. Coronary artery disease with history of myocardial infarction. 7. Tobacco abuse. 8. Sickle-cell trait.  HISTORY OF PRESENT ILLNESS:  This is a 65 year old right-handed female with history of CAD with MI, maintained on aspirin; hypertension; tobacco abuse.  Lives with spouse.  Independent with a cane prior to admission.  One-level home, three steps to entry.  Spouse works 3rd shift.  Presented on May 19, 2016, with left facial droop, slurred speech.  By report, she does not seen her primary care physician in many years, had ran out of blood pressure medications.  CT, MRI showed several small acute, early subacute infarcts within the right anterior paramedian superior pons, right posterior limb of internal capsule, right mid corona radiata.  MRA of the head with no occlusion or stenosis.  CT angiogram of the neck without stenosis or dissection.  The patient did not receive tPA.  TEE showed normal LV systolic function, underwent placement of loop recorder.  Maintained on aspirin for CVA prophylaxis, subcutaneous Lovenox for DVT prophylaxis.  Lower extremity Dopplers negative.  The patient was admitted for comprehensive rehab program.  PAST MEDICAL HISTORY:  See discharge diagnoses.  SOCIAL HISTORY:  Lives with spouse, used a cane prior to admission.  FUNCTIONAL HISTORY:  Upon admission to [facility], minimal assist, 150 feet without assistive device; minimal assist, sit to stand; minimal assist, activities of daily living.  PHYSICAL EXAMINATION:  VITAL SIGNS:  Blood pressure 145/85, pulse 65, temperature 98, respirations 16. GENERAL:  Alert female, in no acute distress, oriented x3. LUNGS:  Clear to auscultation without wheeze. CARDIAC:  Regular rate and rhythm.  No murmur. ABDOMEN:  Soft, nontender.  Good bowel sounds. NEUROLOGIC:  Speech was mildly dysarthric, but fully intelligible.  REHABILITATION HOSPITAL COURSE:  The patient was admitted to Inpatient Rehab Services with therapies initiated on a 3-hour daily basis, consisting of physical therapy, occupational therapy and rehabilitation nursing.  The following issues were addressed during the patient's rehabilitation stay.  Pertaining to Mrs. Elizabeth [name]'s right anterior paramedian pontine infarct remained stable, maintained on aspirin therapy, she would follow up with Neurology Services.  Subcutaneous Lovenox for DVT prophylaxis.  No bleeding episodes.  She remained on Lexapro for history of depression with emotional support provided. Blood pressures controlled on Norvasc, arrangements were made for PCP. No chest pain or shortness of breath.  She did have a history of tobacco abuse, received full counsel in regard to cessation of nicotine products.  It was questionable if she would be compliant with these requests.  During workup of CVA, she did undergo loop recorder placement, would follow Cardiology Services.  The patient received weekly collaborative interdisciplinary  team conferences, full family teaching ongoing underway.  She was ambulating extended distances without assistive device.  Modified independent over even and uneven surfaces.  Sit to stand, bed mobility, modified independent.  Scored 52/56 on Berg balance test.  She could gather her belongings for activities of daily living and homemaking.  Speech  Therapy evaluation for some mild dysarthria, the patient 100% intelligible.  She was discharged to home with ongoing therapies dictated per [facility].  DISCHARGE MEDICATIONS:  At time of dictation included: 1. Norvasc 5 mg p.o. daily 2. Aspirin 325 mg p.o. daily. 3. Lipitor 80 mg p.o. daily. 4. Lexapro 10 mg p.o. daily. 5. Zetia 10 mg daily. 6. Avapro 300 mg p.o. daily. 7. Lopressor 50 mg p.o. b.i.d. 8. Nitroglycerin as needed.  She was on a regular diet.  She would follow up with Dr. Cathlean Cower, medical management; Dr. Alysia Penna at the [facility] as directed.     Elizabeth [name], P.A.   ______________________________ Charlett Blake, M.D.    DA/MEDQ  D:  05/28/2016  T:  05/29/2016  Job:  [id number]

## 2016-05-29 NOTE — Assessment & Plan Note (Signed)

## 2016-05-29 NOTE — Assessment & Plan Note (Addendum)
Suspect mild overcontrolled, ok to reduce amlod to 2.5 qd,  to f/u any worsening symptoms or concerns  In addition to the time spent performing CPE, I spent an additional 15 minutes face to face,in which greater than 50% of this time was spent in counseling and coordination of care for patient's illness as documented.

## 2016-05-29 NOTE — Assessment & Plan Note (Signed)
?   Embolic, pt now with loop recorder, cont same tx,  to f/u any worsening symptoms or concerns

## 2016-05-29 NOTE — Progress Notes (Signed)
Subjective:    Patient ID: Elizabeth [name], female    DOB: October 06, 1950, 65 y.o.   MRN: [id number]  HPI  Here for wellness and f/u;  Overall doing ok;  Pt denies Chest pain, worsening SOB, DOE, wheezing, orthopnea, PND, worsening LE edema, palpitations, and now has loop recorder newly place, for f/u wound clinic tomorrow.  Pt denies polydipsia, polyuria, or low sugar symptoms. Pt states overall good compliance with treatment and medications, good tolerability, and has been trying to follow appropriate diet.  Pt denies worsening depressive symptoms, suicidal ideation or panic. No fever, night sweats, wt loss, loss of appetite, or other constitutional symptoms.  Pt states good ability with ADL's, has low fall risk, home safety reviewed and adequate, no other significant changes in hearing or vision, and only occasionally active with exercise. Due for colonoscopy due to hx of polyps.  Post hospn has had some mild dizziness immed after taking her morning meds most days, but seems to wear off in a few hours.  Had a slip and accidental fall off a chair x 3 wks, right upper arm, shoulder and right neck with some mild achy discomfort.  Has some pain worse to abduction and forward elevation to do her hair, but still has fair good ROM.    Has a new skin nodule kind of lesion to right mid lateral arm, started as ?pimple but now hard and scarred, nontender.   Past Medical History:  Diagnosis Date  . Abrasion or friction burn of foot and toe(s), with infection   . Allergy    soy  . Anxiety   . 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 Degenerative joint disease    Generalized  . Hyperlipidemia   . Hypertension   . Myocardial infarction ([place])   . Sickle cell trait ([place])   . Tobacco abuse April, 2011   Continued   Past Surgical History:  Procedure Laterality Date  . ANGIOPLASTY  2006   x3 stents  . DILATION  AND CURETTAGE OF UTERUS    . EP IMPLANTABLE DEVICE N/A 05/21/2016   Procedure: Loop Recorder Insertion;  Surgeon: Thompson Grayer, MD;  Location: [facility];  Service: Cardiovascular;  Laterality: N/A;  . FOOT SURGERY  2000  . LITHOTRIPSY  2008   left kidney stone and also ureteral stent placed  . TEE WITHOUT CARDIOVERSION N/A 05/21/2016   Procedure: TRANSESOPHAGEAL ECHOCARDIOGRAM (TEE);  Surgeon: Lelon Perla, MD;  Location: [facility] ENDOSCOPY;  Service: Cardiovascular;  Laterality: N/A;    reports that she has been smoking Cigarettes.  She has never used smokeless tobacco. She reports that she does not drink alcohol or use drugs. family history includes Coronary artery disease in her father and mother. Allergies  Allergen Reactions  . Shrimp [Shellfish Allergy] Nausea And Vomiting  . Adhesive [Tape] Other (See Comments)    Unknown  . Soy Allergy Nausea And Vomiting   Current Outpatient Prescriptions on File Prior to Visit  Medication Sig Dispense Refill  . amLODipine (NORVASC) 5 MG tablet Take 1 tablet (5 mg total) by mouth daily. 30 tablet 0  . aspirin 325 MG tablet Take 1 tablet (325 mg total) by mouth daily.    Marland Kitchen atorvastatin (LIPITOR) 80 MG tablet Take 1 tablet (80 mg total) by mouth daily at 6 PM. 30 tablet 0  . escitalopram (LEXAPRO) 10 MG tablet Take 1  tablet (10 mg total) by mouth daily. 30 tablet 0  . ezetimibe (ZETIA) 10 MG tablet Take 1 tablet (10 mg total) by mouth daily. 30 tablet 0  . irbesartan (AVAPRO) 300 MG tablet Take 1 tablet (300 mg total) by mouth daily. 30 tablet 0  . metoprolol (LOPRESSOR) 50 MG tablet Take 1 tablet (50 mg total) by mouth 2 (two) times daily. 60 tablet 0  . nitroGLYCERIN (NITROLINGUAL) 0.4 MG/SPRAY spray Place 1 spray under the tongue every 5 (five) minutes as needed. May repeat up to 3 doses. 12 g 0   No current facility-administered medications on file prior to visit.     Review of Systems Constitutional: Negative for increased  diaphoresis, or other activity, appetite or siginficant weight change other than noted HENT: Negative for worsening hearing loss, ear pain, facial swelling, mouth sores and neck stiffness.   Eyes: Negative for other worsening pain, redness or visual disturbance.  Respiratory: Negative for choking or stridor Cardiovascular: Negative for other chest pain and palpitations.  Gastrointestinal: Negative for worsening diarrhea, blood in stool, or abdominal distention Genitourinary: Negative for hematuria, flank pain or change in urine volume.  Musculoskeletal: Negative for myalgias or other joint complaints.  Skin: Negative for other color change and wound or drainage.  Neurological: Negative for syncope and numbness. other than noted Hematological: Negative for adenopathy. or other swelling Psychiatric/Behavioral: Negative for hallucinations, SI, self-injury, decreased concentration or other worsening agitation.      Objective:   Physical Exam BP 118/68   Pulse 61   Temp 98.3 F (36.8 C) (Oral)   Resp 20   Wt 133 lb (60.3 kg)   SpO2 97%   BMI 22.13 kg/m  BP 118/68   Pulse 61   Temp 98.3 F (36.8 C) (Oral)   Resp 20   Wt 133 lb (60.3 kg)   SpO2 97%   BMI 22.13 kg/m  VS noted,  Constitutional: Pt is oriented to person, place, and time. Appears well-developed and well-nourished, in no significant distress Head: Normocephalic and atraumatic  Eyes: Conjunctivae and EOM are normal. Pupils are equal, round, and reactive to light Right Ear: External ear normal.  Left Ear: External ear normal Nose: Nose normal.  Mouth/Throat: Oropharynx is clear and moist  Neck: Normal range of motion. Neck supple. No JVD present. No tracheal deviation present or significant neck LA or mass Cardiovascular: Normal rate, regular rhythm, normal heart sounds and intact distal pulses.   Pulmonary/Chest: Effort normal and breath sounds without rales or wheezing  Abdominal: Soft. Bowel sounds are normal. NT.  No HSM  Musculoskeletal: Normal range of motion. Exhibits no edema Lymphadenopathy: Has no cervical adenopathy.  Neurological: Pt is alert and oriented to person, place, and time. Pt has normal reflexes. No cranial nerve deficit. Motor grossly intact, o/w not done in detail except noted + dysrthric mild Skin: Skin is warm and dry. No rash noted or new ulcers Psychiatric:  Has ? Mild depressed mood and affect. Behavior is normal.   Lab Results  Component Value Date   WBC 7.1 05/23/2016   HGB 13.0 05/23/2016   HCT 39.2 05/23/2016   PLT 127 (L) 05/23/2016   GLUCOSE 100 (H) 05/23/2016   CHOL 248 (H) 05/20/2016   TRIG 145 05/20/2016   HDL 54 05/20/2016   LDLDIRECT 164.8 01/17/2010   LDLCALC 165 (H) 05/20/2016   ALT 14 05/23/2016   AST 21 05/23/2016   NA 139 05/23/2016   K 3.7 05/23/2016   CL 108  05/23/2016   CREATININE 0.86 05/23/2016   BUN 9 05/23/2016   CO2 25 05/23/2016   TSH 1.70 05/17/2013   INR 0.95 05/19/2016   HGBA1C 5.5 05/20/2016    Transesophageal Echocardiography Study Date: 05/21/2016  Impressions:  - Normal LV systolic function; LVH with proximal septal thickening;   prominent lipomatous hypertrophy of atrial septum; mild MR;   negative saline microcavitation study.    Assessment & Plan:

## 2016-05-29 NOTE — Assessment & Plan Note (Signed)
On new high dose statin, for recheck next visiut Lab Results  Component Value Date   LDLCALC 165 (H) 05/20/2016

## 2016-05-30 ENCOUNTER — Encounter: Payer: Self-pay | Admitting: Physical Therapy

## 2016-05-30 ENCOUNTER — Ambulatory Visit: Payer: 59 | Admitting: Physical Therapy

## 2016-05-30 ENCOUNTER — Ambulatory Visit: Payer: 59 | Admitting: *Deleted

## 2016-05-30 ENCOUNTER — Encounter: Payer: Self-pay | Admitting: Internal Medicine

## 2016-05-30 DIAGNOSIS — R278 Other lack of coordination: Secondary | ICD-10-CM | POA: Diagnosis not present

## 2016-05-30 DIAGNOSIS — R2681 Unsteadiness on feet: Secondary | ICD-10-CM

## 2016-05-30 DIAGNOSIS — R2689 Other abnormalities of gait and mobility: Secondary | ICD-10-CM | POA: Diagnosis not present

## 2016-05-30 DIAGNOSIS — I69352 Hemiplegia and hemiparesis following cerebral infarction affecting left dominant side: Secondary | ICD-10-CM

## 2016-05-30 DIAGNOSIS — R42 Dizziness and giddiness: Secondary | ICD-10-CM

## 2016-05-30 DIAGNOSIS — I639 Cerebral infarction, unspecified: Secondary | ICD-10-CM

## 2016-05-30 LAB — HEPATITIS C ANTIBODY: HCV Ab: NEGATIVE

## 2016-05-30 NOTE — Patient Instructions (Addendum)
1. Hold a pen on front of you at arm's length. The pen should be vertical, with the tip at the top.The pen should be directly in front of your face, with the tip just below eye level.  2. Move the pencil slowly toward your face (all the way to your nose) as you concentrate and focus on the point. Soon you'll notice that you see two pens rather than one OR the pen begins shaking. Stop.  3. Look away from the pen briefly to rest your eyes. Focus on something across the room for two or three seconds, and then look back at the pen point where you've stopped it close to your face. Look at the pen point carefully, and to try to focus so that the double vision Or shaking disappears and you only see one, still pen.  4. Move the pen back out to arm's length when you are able to rid yourself of double vision. If this takes more than a few seconds, look away and try again. Once you are able to accomplish it, move the pen back out to arm's length and complete the exercise again.  Try to perform this exercise 5 minutes per day.

## 2016-05-30 NOTE — Progress Notes (Signed)
Wound Loop check in clinic. Steri-strips removed, wound without redness, swelling. Incision edges well approximated.  Battery status: good. R-waves 0.96 mV. 0 symptom episodes, 0 tachy episodes. Brady and Pause off. 0 AF episodes. Pt educated about home monitor and wound care. Monthly summary reports and ROV with JA PRN.

## 2016-05-30 NOTE — Therapy (Signed)
[facility] [address] [place], [place], [address] Phone: [phone]   Fax:  [phone]  Physical Therapy Evaluation  Patient Details  Name: Elizabeth [name] MRN: [id number] Date of Birth: 1951-06-01 Referring Provider: Alysia Penna, MD  Encounter Date: 05/30/2016      PT End of Session - 05/30/16 1725    Visit Number 1   Number of Visits 9  eval + 8 visits   Date for PT Re-Evaluation 06/29/16   Authorization Type MCR Traditional primary; UHC secondary   Authorization Time Period G Codes required   PT Start Time 0802   PT Stop Time 0849   PT Time Calculation (min) 47 min   Activity Tolerance Patient tolerated treatment well   Behavior During Therapy [facility] for tasks assessed/performed      Past Medical History:  Diagnosis Date  . Abrasion or friction burn of foot and toe(s), with infection   . Allergy    soy  . Anxiety   . 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 Degenerative joint disease    Generalized  . Hyperlipidemia   . Hypertension   . Myocardial infarction ([place])   . Sickle cell trait ([place])   . Tobacco abuse April, 2011   Continued    Past Surgical History:  Procedure Laterality Date  . ANGIOPLASTY  2006   x3 stents  . DILATION AND CURETTAGE OF UTERUS    . EP IMPLANTABLE DEVICE N/A 05/21/2016   Procedure: Loop Recorder Insertion;  Surgeon: Thompson Grayer, MD;  Location: [facility];  Service: Cardiovascular;  Laterality: N/A;  . FOOT SURGERY  2000  . LITHOTRIPSY  2008   left kidney stone and also ureteral stent placed  . TEE WITHOUT CARDIOVERSION N/A 05/21/2016   Procedure: TRANSESOPHAGEAL ECHOCARDIOGRAM (TEE);  Surgeon: Lelon Perla, MD;  Location: [facility] ENDOSCOPY;  Service: Cardiovascular;  Laterality: N/A;    There were no vitals filed for this visit.       Subjective Assessment -  05/30/16 0808    Subjective "I'm here to get better at the things I do around the house.Marland KitchenMarland KitchenI'm slow now." Pt reports that walking is slower now since CVA; pt has to focus more on what she's doing.  Pt reports 2 falls before CVA; injured R shoulder. Pt also reports increased dizziness.    Patient is accompained by: Family member  husband, Denyse Amass   Pertinent History PMH significant for: CAD, MI, angioplasty with stents x3 (2006), HTN, HLD, cervical radiculitis, DJD, anxiety, depression, R shoulder pain, sickle cell trait, cardiac loop recorder (05/21/16)   Diagnostic tests CT/MRI of brain: several small acute, early subacute infarcts in R pons, R posterior limb of internal capsule, and R corona radiata   Patient Stated Goals "To get better at what I need to do around the house...to get my balance and to make sure I don't spill things."   Currently in Pain? Yes   Pain Score 7    Pain Location Shoulder   Pain Orientation Right   Pain Descriptors / Indicators Other (Comment)  "just hurts", per pt   Pain Type Chronic pain   Pain Onset More than a month ago   Pain Frequency Constant   Aggravating Factors  RUE elevation   Pain Relieving Factors Tylenol   Multiple Pain Sites No  [facility] PT Assessment - 05/30/16 0001      Assessment   Medical Diagnosis R pontine CVA   Referring Provider Alysia Penna, MD   Onset Date/Surgical Date 05/19/16   Hand Dominance Left     Precautions   Precautions Other (comment)  loop recorder     Restrictions   Weight Bearing Restrictions No     Balance Screen   Has the patient fallen in the past 6 months Yes   How many times? 2   Has the patient had a decrease in activity level because of a fear of falling?  Yes   Is the patient reluctant to leave their home because of a fear of falling?  No     Prior Function   Level of Independence Independent   Vocation Retired   Leisure childcare for friends     Sensation   Light Touch Appears Intact   in [employer]   Gross Motor Movements are Fluid and Coordinated Yes   Fine Motor Movements are Fluid and Coordinated Yes   Coordination and Movement Description RLE disdiadochokinesia   Heel Shin Test Grossly intact     ROM / Strength   AROM / PROM / Strength AROM;PROM;Strength     AROM   Overall AROM  Within functional limits for tasks performed  BLE's     PROM   Overall PROM  Within functional limits for tasks performed  BLE's     Strength   Overall Strength Deficits   Overall Strength Comments RLE strength grossly WFL in all planes.   Left Hip Flexion 4-/5   Left Knee Flexion 4/5   Left Knee Extension 4/5   Left Ankle Dorsiflexion 4/5     Ambulation/Gait   Ambulation/Gait Yes   Ambulation/Gait Assistance 6: Modified independent (Device/Increase time);5: Supervision   Ambulation/Gait Assistance Details Mod I for linear gait over level, indoor surfaces; (S) to min guard for aspects of dynamic gait. See FGA for details.   Ambulation Distance (Feet) 250 Feet   Assistive device None   Gait Pattern Narrow base of support;Step-through pattern;Decreased hip/knee flexion - left;Decreased dorsiflexion - left;Poor foot clearance - left   Ambulation Surface Level;Indoor   Gait velocity 2.88 ft/sec   Stairs Yes   Stairs Assistance 5: Supervision   Stair Management Technique Two rails;Alternating pattern;Forwards   Number of Stairs 4   Height of Stairs 6   Door Management --   Ramp 4: Min assist  ascended only   Ramp Details (indicate cue type and reason) min guard due to inconsistent LLE clearance   Curb 4: Min assist  descended only   Curb Details (indicate cue type and reason) min guard      Functional Gait  Assessment   Gait assessed  Yes   Gait Level Surface Walks 20 ft in less than 7 sec but greater than 5.5 sec, uses assistive device, slower speed, mild gait deviations, or deviates 6-10 in outside of the 12 in walkway width.   Change in Gait Speed Able to  change speed, demonstrates mild gait deviations, deviates 6-10 in outside of the 12 in walkway width, or no gait deviations, unable to achieve a major change in velocity, or uses a change in velocity, or uses an assistive device.   Gait with Horizontal Head Turns Performs head turns with moderate changes in gait velocity, slows down, deviates 10-15 in outside 12 in walkway width but recovers, can continue to walk.   Gait  with Vertical Head Turns Performs task with slight change in gait velocity (eg, minor disruption to smooth gait path), deviates 6 - 10 in outside 12 in walkway width or uses assistive device   Gait and Pivot Turn Pivot turns safely in greater than 3 sec and stops with no loss of balance, or pivot turns safely within 3 sec and stops with mild imbalance, requires small steps to catch balance.   Step Over Obstacle Is able to step over one shoe box (4.5 in total height) but must slow down and adjust steps to clear box safely. May require verbal cueing.   Gait with Narrow Base of Support Ambulates less than 4 steps heel to toe or cannot perform without assistance.   Gait with Eyes Closed Walks 20 ft, slow speed, abnormal gait pattern, evidence for imbalance, deviates 10-15 in outside 12 in walkway width. Requires more than 9 sec to ambulate 20 ft.   Ambulating Backwards Walks 20 ft, slow speed, abnormal gait pattern, evidence for imbalance, deviates 10-15 in outside 12 in walkway width.   Steps Alternating feet, must use rail.   Total Score 14   FGA comment: </= 22/30 is predictive of falls            Vestibular Assessment - 05/30/16 0001      Symptom Behavior   Type of Dizziness Diplopia  and imbalance   Frequency of Dizziness daily; multiple times per day   Duration of Dizziness seconds   Aggravating Factors Turning head quickly;Turning body quickly   Relieving Factors Head stationary;Slow movements;Rest     Occulomotor Exam   Occulomotor Alignment Normal   Spontaneous  Absent   Gaze-induced Absent   Smooth Pursuits Saccades  horizontal to R > L   Saccades Slow   Comment Convergence abnormal; diplopia in L visual field with near vision     Vestibulo-Occular Reflex   VOR 1 Head Only (x 1 viewing) Impaired and symptomatic with horizontal head movement.               [place] Adult PT Treatment/Exercise - 05/30/16 0001      Bed Mobility   Bed Mobility Sit to Supine;Supine to Sit   Rolling Right 6: Modified independent (Device/Increase time)   Rolling Left 6: Modified independent (Device/Increase time)   Supine to Sit 6: Modified independent (Device/Increase time)   Sit to Supine 6: Modified independent (Device/Increase time)     Transfers   Transfers Sit to Stand;Stand to Sit   Sit to Stand 6: Modified independent (Device/Increase time)   Stand to Sit 6: Modified independent (Device/Increase time)   Comments able to stand from standard chair without UE use                PT Education - 05/30/16 0919    Education provided Yes   Education Details PT eval findings, goals, and POC. Initiated HEP for convergence; see Pt Instructions.    Person(s) Educated Patient;Spouse   Methods Explanation;Demonstration;Verbal cues;Handout   Comprehension Verbalized understanding;Returned demonstration          PT Short Term Goals - 05/30/16 0919      PT SHORT TERM GOAL #1   Title STG's = LTG's           PT Long Term Goals - 05/30/16 1735      PT LONG TERM GOAL #1   Title Pt will independently perform HEP to maximize functional gains made in PT.  (Target date: 06/27/16)  PT LONG TERM GOAL #2   Title Pt will improve FGA score from 14 to >/= 23/30 to indicate decreased fall risk.  (06/27/16)     PT LONG TERM GOAL #3   Title Pt will independently ambulate >1,000' over unlevel, paved and grass surfaces to indicate increased safety with community mobility.  (06/27/16)     PT LONG TERM GOAL #4   Title Pt will independently negotiate  standard ramp and curb step to indicate increased safety traversing community obstacles.  (06/27/16)     PT LONG TERM GOAL #5   Title Manual TUG time will be within 5 seconds of Normal TUG time to indicate increased gait stability with concurrent manual task. 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  PMH significant for: CAD, MI, angioplasty with stents x3 (2006), HTN, HLD, cervical radiculitis, DJD, anxiety, depression, R shoulder pain, sickle cell trait, cardiac loop recorder (05/21/16). PT evaluation reveals the following: L hemiparesis, impaired grading of movmenet and selective control in LLE, gait impairments, FGA score suggestive of increased fall risk, disequilibrium with functional head turns, impaired convergence, saccadic smooth pursuits, and impaired VOR. Pt will benefit from skilled outpatient PT 2x/week for 4 weeks to address said impairments.    Rehab Potential Good   Clinical Impairments Affecting Rehab Potential Positive: age, supportive husband   PT Frequency 2x / week   PT Duration 4 weeks   PT Treatment/Interventions ADLs/Self Care Home Management;Therapeutic activities;Functional mobility training;Stair training;Gait training;Therapeutic exercise;Balance training;Neuromuscular re-education;Patient/family education;Vestibular;Visual/perceptual remediation/compensation   PT Next Visit Plan Initiate HEP for dynamic gait (horizontal head turns, retro gait).   Standing balance, LLE NMR, community mobility/obstacle negotiation   Consulted and Agree with Plan of Care Patient;Family member/caregiver   Family Member Consulted husband, Denyse Amass      Patient will benefit from skilled therapeutic intervention in order to  improve the following deficits and impairments:  Decreased balance, Abnormal gait, Decreased coordination, Dizziness, Impaired vision/preception, Decreased strength, Impaired UE functional use, Pain, Other (comment) (Shoulder pain will be monitored but not directly addressed by PT due to nature of referral)  Visit Diagnosis: Other abnormalities of gait and mobility - Plan: PT plan of care cert/re-cert  Hemiplegia and hemiparesis following cerebral infarction affecting left dominant side ([place]) - Plan: PT plan of care cert/re-cert  Unsteadiness on feet - Plan: PT plan of care cert/re-cert  Dizziness and giddiness - Plan: PT plan of care cert/re-cert      G-Codes - 0000000 [id number]    Functional Assessment Tool Used FGA = 14/30   Functional Limitation Mobility: Walking and moving around   Mobility: Walking and Moving Around Current Status [phone]) At least 40 percent but less than 60 percent impaired, limited or restricted   Mobility: Walking and Moving Around Goal Status [phone]) At least 20 percent but less than 40 percent impaired, limited or restricted       Problem List Patient Active Problem List   Diagnosis Date Noted  . 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 Facial droop   . Adjustment disorder with anxious mood   . Acute CVA (cerebrovascular accident) ([place]) 05/20/2016  . Slurred speech   . Dysarthria, post-stroke   . Sickle cell trait ([place])   .  Acute embolic stroke ([place]) [id number]  . Depression   . Cervical radiculitis 05/17/2013  . Right shoulder pain 11/16/2012  . Anxiety 04/02/2011  . Encounter for well adult exam with abnormal findings 03/31/2011  . CAD (coronary artery disease)   . Benign essential HTN   . Hyperlipidemia   . Degenerative joint disease   . Tobacco abuse 12/22/2009    Billie Ruddy, PT, DPT [facility] [address] [place], [place], [address] Phone: [phone]   Fax:  [phone] 05/30/16, 5:51  PM   Name: Dessirae Raina MRN: [id number] Date of Birth: 08/16/1951

## 2016-06-03 ENCOUNTER — Encounter: Payer: Medicare Other | Admitting: *Deleted

## 2016-06-04 ENCOUNTER — Encounter: Payer: Self-pay | Admitting: Physical Therapy

## 2016-06-04 ENCOUNTER — Ambulatory Visit: Payer: 59 | Admitting: Physical Therapy

## 2016-06-04 DIAGNOSIS — R2689 Other abnormalities of gait and mobility: Secondary | ICD-10-CM | POA: Diagnosis not present

## 2016-06-04 DIAGNOSIS — R42 Dizziness and giddiness: Secondary | ICD-10-CM | POA: Diagnosis not present

## 2016-06-04 DIAGNOSIS — I69352 Hemiplegia and hemiparesis following cerebral infarction affecting left dominant side: Secondary | ICD-10-CM | POA: Diagnosis not present

## 2016-06-04 DIAGNOSIS — R2681 Unsteadiness on feet: Secondary | ICD-10-CM | POA: Diagnosis not present

## 2016-06-04 DIAGNOSIS — R278 Other lack of coordination: Secondary | ICD-10-CM | POA: Diagnosis not present

## 2016-06-04 NOTE — Patient Instructions (Addendum)
Perform these at your kitchen counter top so can hold on for balance as needed:   "I love a Parade" Lift    High knee marching forward and then backwards. Hold the knee in the air 3 seconds each time.  Perform 3 laps each way. 1-2 sessions per day.  http://gt2.exer.us/345   Copyright  VHI. All rights reserved.   Feet Heel-Toe "Tandem"    Arms at sides or as needed for balance, walk a straight line forward bringing one foot directly in front of the other, and then backwards bringing one foot directly behind the other one Repeat for 3 laps each way. Do _1-2___ sessions per day.  Copyright  VHI. All rights reserved.    Walking on Toes    Walk on toes forward while continuing on a straight path, and then backwards on a straight path. Perform 3 laps each way. Do __1-2 sessions per day.  Copyright  VHI. All rights reserved.  Walking on Heels    Walk on heels forward while continuing on a straight path, and then backwards on a straight path. Perform 3 laps each way. Do _1-2__ sessions per day.  Copyright  VHI. All rights reserved.   Perform these in a hallway:  Side to Side Head Motion    Perform without assistive device. Walking on solid surface, turn head and eyes to left for __3__ steps.  Then, turn head and eyes to opposite side for _3___ steps. Repeat for 4 laps forward only. Do _1-2___ sessions per day.  Copyright  VHI. All rights reserved.  Up / Down Head Motion    Perform without assistive device. Walking on solid surface, move head and eyes toward ceiling for _3___ steps.  Then, move head and eyes toward floor for __3__ steps. Repeat for 4 laps forward only.  Do __1-2__ sessions per day.  Copyright  VHI. All rights reserved.

## 2016-06-05 ENCOUNTER — Encounter: Payer: Self-pay | Admitting: Physical Therapy

## 2016-06-05 ENCOUNTER — Ambulatory Visit: Payer: 59 | Admitting: Physical Therapy

## 2016-06-05 ENCOUNTER — Ambulatory Visit: Payer: 59 | Admitting: Occupational Therapy

## 2016-06-05 DIAGNOSIS — I69352 Hemiplegia and hemiparesis following cerebral infarction affecting left dominant side: Secondary | ICD-10-CM

## 2016-06-05 DIAGNOSIS — R2689 Other abnormalities of gait and mobility: Secondary | ICD-10-CM | POA: Diagnosis not present

## 2016-06-05 DIAGNOSIS — R42 Dizziness and giddiness: Secondary | ICD-10-CM

## 2016-06-05 DIAGNOSIS — R2681 Unsteadiness on feet: Secondary | ICD-10-CM | POA: Diagnosis not present

## 2016-06-05 DIAGNOSIS — R278 Other lack of coordination: Secondary | ICD-10-CM

## 2016-06-05 NOTE — Therapy (Signed)
[facility] [address] [place], [place], [address] Phone: [phone]   Fax:  [phone]  Physical Therapy Treatment  Patient Details  Name: Elizabeth [name] MRN: [id number] Date of Birth: 07-Apr-1951 Referring Provider: Alysia Penna, MD  Encounter Date: 06/04/2016      PT End of Session - 06/04/16 0853    Visit Number 2   Number of Visits 9  eval + 8 visits   Date for PT Re-Evaluation 06/29/16   Authorization Type MCR Traditional primary; UHC secondary   Authorization Time Period G Codes required   PT Start Time 0848   PT Stop Time 0930   PT Time Calculation (min) 42 min   Activity Tolerance Patient tolerated treatment well   Behavior During Therapy [facility] for tasks assessed/performed      Past Medical History:  Diagnosis Date  . Abrasion or friction burn of foot and toe(s), with infection   . Allergy    soy  . Anxiety   . 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 Degenerative joint disease    Generalized  . Hyperlipidemia   . Hypertension   . Myocardial infarction ([place])   . Sickle cell trait ([place])   . Tobacco abuse April, 2011   Continued    Past Surgical History:  Procedure Laterality Date  . ANGIOPLASTY  2006   x3 stents  . DILATION AND CURETTAGE OF UTERUS    . EP IMPLANTABLE DEVICE N/A 05/21/2016   Procedure: Loop Recorder Insertion;  Surgeon: Thompson Grayer, MD;  Location: [facility];  Service: Cardiovascular;  Laterality: N/A;  . FOOT SURGERY  2000  . LITHOTRIPSY  2008   left kidney stone and also ureteral stent placed  . TEE WITHOUT CARDIOVERSION N/A 05/21/2016   Procedure: TRANSESOPHAGEAL ECHOCARDIOGRAM (TEE);  Surgeon: Lelon Perla, MD;  Location: [facility] ENDOSCOPY;  Service: Cardiovascular;  Laterality: N/A;    There were no vitals filed for this visit.      Subjective Assessment -  06/04/16 0852    Subjective No new complaints. No falls or pain to report.    Patient is accompained by: Family member  spouse in lobby   Pertinent History PMH significant for: CAD, MI, angioplasty with stents x3 (2006), HTN, HLD, cervical radiculitis, DJD, anxiety, depression, R shoulder pain, sickle cell trait, cardiac loop recorder (05/21/16)   Diagnostic tests CT/MRI of brain: several small acute, early subacute infarcts in R pons, R posterior limb of internal capsule, and R corona radiata   Patient Stated Goals "To get better at what I need to do around the house...to get my balance and to make sure I don't spill things."   Currently in Pain? No/denies   Pain Score 0-No pain     Treatment: issued to following to pt's HEP Perform these at your kitchen counter top so can hold on for balance as needed:   "I love a Parade" Lift    High knee marching forward and then backwards. Hold the knee in the air 3 seconds each time.  Perform 3 laps each way. 1-2 sessions per day.  http://gt2.exer.us/345   Copyright  VHI. All rights reserved.   Feet Heel-Toe "Tandem"    Arms at sides or as needed for balance, walk a straight line forward bringing one foot directly in front of the other, and then backwards bringing one foot directly  behind the other one Repeat for 3 laps each way. Do _1-2___ sessions per day.  Copyright  VHI. All rights reserved.    Walking on Toes    Walk on toes forward while continuing on a straight path, and then backwards on a straight path. Perform 3 laps each way. Do __1-2 sessions per day.  Copyright  VHI. All rights reserved.  Walking on Heels    Walk on heels forward while continuing on a straight path, and then backwards on a straight path. Perform 3 laps each way. Do _1-2__ sessions per day.  Copyright  VHI. All rights reserved.   Perform these in a hallway:  Side to Side Head Motion    Perform without assistive device. Walking on solid  surface, turn head and eyes to left for __3__ steps.  Then, turn head and eyes to opposite side for _3___ steps. Repeat for 4 laps forward only. Do _1-2___ sessions per day.  Copyright  VHI. All rights reserved.  Up / Down Head Motion    Perform without assistive device. Walking on solid surface, move head and eyes toward ceiling for _3___ steps.  Then, move head and eyes toward floor for __3__ steps. Repeat for 4 laps forward only.  Do __1-2__ sessions per day.  Copyright  VHI. All rights reserved.         Balance Exercises - 06/05/16 0826      Balance Exercises: Standing   Rockerboard Anterior/posterior;Lateral;Head turns;EC;30 seconds;10 reps     Balance Exercises: Standing   Rebounder Limitations performed both ways on balance board with no UE support, min to mod assist for balance with cues on posture and weight shifting for improved balance: EC no head movements 30 sec's x 3 reps, EC head movements up<>down, left<>right x 10 reps each           PT Education - 06/04/16 0916    Education provided Yes   Education Details HEP for balance   Person(s) Educated Patient   Methods Explanation;Demonstration;Verbal cues;Handout   Comprehension Verbalized understanding;Returned demonstration          PT Short Term Goals - 05/30/16 0919      PT SHORT TERM GOAL #1   Title STG's = LTG's           PT Long Term Goals - 05/30/16 1735      PT LONG TERM GOAL #1   Title Pt will independently perform HEP to maximize functional gains made in PT.  (Target date: 06/27/16)     PT LONG TERM GOAL #2   Title Pt will improve FGA score from 14 to >/= 23/30 to indicate decreased fall risk.  (06/27/16)     PT LONG TERM GOAL #3   Title Pt will independently ambulate >1,000' over unlevel, paved and grass surfaces to indicate increased safety with community mobility.  (06/27/16)     PT LONG TERM GOAL #4   Title Pt will independently negotiate standard ramp and curb step to indicate  increased safety traversing community obstacles.  (06/27/16)     PT LONG TERM GOAL #5   Title Manual TUG time will be within 5 seconds of Normal TUG time to indicate increased gait stability with concurrent manual task. (06/27/16)            Plan - 06/04/16 0853    Clinical Impression Statement Todays skilled session focused on establishing HEP for balance and then continued to work on balance with no issues reported. Pt is  making steady progress toward goals. Pt should benefit from continued PT to progress toward unmet goals.   Rehab Potential Good   Clinical Impairments Affecting Rehab Potential Positive: age, supportive husband   PT Frequency 2x / week   PT Duration 4 weeks   PT Treatment/Interventions ADLs/Self Care Home Management;Therapeutic activities;Functional mobility training;Stair training;Gait training;Therapeutic exercise;Balance training;Neuromuscular re-education;Patient/family education;Vestibular;Visual/perceptual remediation/compensation   PT Next Visit Plan  Standing balance, LLE NMR, community mobility/obstacle negotiation, single leg stance activities and coordination activities   Consulted and Agree with Plan of Care Patient;Family member/caregiver   Family Member Consulted husband, Denyse Amass      Patient will benefit from skilled therapeutic intervention in order to improve the following deficits and impairments:  Decreased balance, Abnormal gait, Decreased coordination, Dizziness, Impaired vision/preception, Decreased strength, Impaired UE functional use, Pain, Other (comment) (Shoulder pain will be monitored but not directly addressed by PT due to nature of referral)  Visit Diagnosis: Other abnormalities of gait and mobility  Unsteadiness on feet  Dizziness and giddiness  Hemiplegia and hemiparesis following cerebral infarction affecting left dominant side [facility])     Problem List Patient Active Problem List   Diagnosis Date Noted  . 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 Facial droop   . Adjustment disorder with anxious mood   . Acute CVA (cerebrovascular accident) ([place]) 05/20/2016  . Slurred speech   . Dysarthria, post-stroke   . Sickle cell trait ([place])   . Acute embolic stroke ([place]) [id number]  . Depression   . Cervical radiculitis 05/17/2013  . Right shoulder pain 11/16/2012  . Anxiety 04/02/2011  . Encounter for well adult exam with abnormal findings 03/31/2011  . CAD (coronary artery disease)   . Benign essential HTN   . Hyperlipidemia   . Degenerative joint disease   . Tobacco abuse 12/22/2009    Willow Ora, PTA, [facility] [address] [place], [place] [address] [phone] 06/05/16, 8:31 AM   Name: Elizabeth [name] MRN: [id number] Date of Birth: 05-02-1951

## 2016-06-05 NOTE — Therapy (Signed)
[facility] [address] [place], [place], [address] Phone: [phone]   Fax:  [phone]  Physical Therapy Treatment  Patient Details  Name: Elizabeth [name] MRN: [id number] Date of Birth: 10-29-1950 Referring Provider: Alysia Penna, MD  Encounter Date: 06/05/2016      PT End of Session - 06/05/16 0852    Visit Number 3   Number of Visits 9  eval + 8 visits   Date for PT Re-Evaluation 06/29/16   Authorization Type MCR Traditional primary; UHC secondary   Authorization Time Period G Codes required   PT Start Time [phone]   PT Stop Time 0930   PT Time Calculation (min) 41 min   Equipment Utilized During Treatment Gait belt   Activity Tolerance Patient tolerated treatment well   Behavior During Therapy [facility] for tasks assessed/performed      Past Medical History:  Diagnosis Date  . Abrasion or friction burn of foot and toe(s), with infection   . Allergy    soy  . Anxiety   . 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 Degenerative joint disease    Generalized  . Hyperlipidemia   . Hypertension   . Myocardial infarction ([place])   . Sickle cell trait ([place])   . Tobacco abuse April, 2011   Continued    Past Surgical History:  Procedure Laterality Date  . ANGIOPLASTY  2006   x3 stents  . DILATION AND CURETTAGE OF UTERUS    . EP IMPLANTABLE DEVICE N/A 05/21/2016   Procedure: Loop Recorder Insertion;  Surgeon: Thompson Grayer, MD;  Location: [facility];  Service: Cardiovascular;  Laterality: N/A;  . FOOT SURGERY  2000  . LITHOTRIPSY  2008   left kidney stone and also ureteral stent placed  . TEE WITHOUT CARDIOVERSION N/A 05/21/2016   Procedure: TRANSESOPHAGEAL ECHOCARDIOGRAM (TEE);  Surgeon: Lelon Perla, MD;  Location: [facility] ENDOSCOPY;  Service: Cardiovascular;  Laterality: N/A;    There were no vitals filed for  this visit.      Subjective Assessment - 06/05/16 0852    Subjective No new complaints. No falls or pain to report.    Patient is accompained by: Family member  spouse in lobby   Pertinent History PMH significant for: CAD, MI, angioplasty with stents x3 (2006), HTN, HLD, cervical radiculitis, DJD, anxiety, depression, R shoulder pain, sickle cell trait, cardiac loop recorder (05/21/16)   Diagnostic tests CT/MRI of brain: several small acute, early subacute infarcts in R pons, R posterior limb of internal capsule, and R corona radiata   Patient Stated Goals "To get better at what I need to do around the house...to get my balance and to make sure I don't spill things."   Currently in Pain? No/denies   Pain Score 0-No pain            [facility] Adult PT Treatment/Exercise - 06/05/16 0853      Transfers   Transfers Sit to Stand;Stand to Sit   Sit to Stand 6: Modified independent (Device/Increase time)   Stand to Sit 6: Modified independent (Device/Increase time)     Ambulation/Gait   Ambulation/Gait Yes   Assistive device None   Gait Pattern Narrow base of support;Step-through pattern;Decreased hip/knee flexion - left;Decreased dorsiflexion - left;Poor foot clearance - left   Ambulation Surface Level;Indoor   Gait Comments along 50 foot hallway: forward gait with  head movements up<>down and left<>right x 2 laps each with min guard assist and minor veering/decreased gait speed noted; head movements in diagonal planes x 4 laps each with min assist, with veering and decreased gait speed noted as well.                                 High Level Balance   High Level Balance Activities Tandem walking;Marching forwards;Marching backwards  tandem fwd/bwd, toe walk fwd/bwd, heel walk fwd/bwd   High Level Balance Comments on red mats x 3 laps each with min guard to min assist, cues on posture and ex form           Balance Exercises - 06/05/16 0910      Balance Exercises: Standing   SLS with  Vectors Foam/compliant surface;Other reps (comment);Limitations     Balance Exercises: Standing   SLS with Vectors Limitations 6 cones along edge of red mats: alternating toe taps to each with side stepping, alternating double toe taps to each with side stepping and alternating flipping over/up each foot to each cone with side stepping, min guard to min assist for balance with cues on posture and weight shifting.                                         PT Short Term Goals - 05/30/16 0919      PT SHORT TERM GOAL #1   Title STG's = LTG's           PT Long Term Goals - 05/30/16 1735      PT LONG TERM GOAL #1   Title Pt will independently perform HEP to maximize functional gains made in PT.  (Target date: 06/27/16)     PT LONG TERM GOAL #2   Title Pt will improve FGA score from 14 to >/= 23/30 to indicate decreased fall risk.  (06/27/16)     PT LONG TERM GOAL #3   Title Pt will independently ambulate >1,000' over unlevel, paved and grass surfaces to indicate increased safety with community mobility.  (06/27/16)     PT LONG TERM GOAL #4   Title Pt will independently negotiate standard ramp and curb step to indicate increased safety traversing community obstacles.  (06/27/16)     PT LONG TERM GOAL #5   Title Manual TUG time will be within 5 seconds of Normal TUG time to indicate increased gait stability with concurrent manual task. (06/27/16)            Plan - 06/05/16 0853    Clinical Impression Statement Today's skilled session continued to focus on high level balance and dynamic gait balance with no issues noted. Pt most challanged with complaint surfaces today. Pt is making steady progress toward goals.    Rehab Potential Good   Clinical Impairments Affecting Rehab Potential Positive: age, supportive husband   PT Frequency 2x / week   PT Duration 4 weeks   PT Treatment/Interventions ADLs/Self Care Home Management;Therapeutic activities;Functional mobility training;Stair  training;Gait training;Therapeutic exercise;Balance training;Neuromuscular re-education;Patient/family education;Vestibular;Visual/perceptual remediation/compensation   PT Next Visit Plan  Standing balance, LLE NMR, community mobility/obstacle negotiation, single leg stance activities and coordination activities   Consulted and Agree with Plan of Care Patient;Family member/caregiver   Family Member Consulted husband, Denyse Amass      Patient will benefit from skilled  therapeutic intervention in order to improve the following deficits and impairments:  Decreased balance, Abnormal gait, Decreased coordination, Dizziness, Impaired vision/preception, Decreased strength, Impaired UE functional use, Pain, Other (comment) (Shoulder pain will be monitored but not directly addressed by PT due to nature of referral)  Visit Diagnosis: 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 Facial droop   . Adjustment disorder with anxious mood   . Acute CVA (cerebrovascular accident) ([place]) 05/20/2016  . Slurred speech   . Dysarthria, post-stroke   . Sickle cell trait ([place])   . Acute embolic stroke ([place]) [id number]  . Depression   . Cervical radiculitis 05/17/2013  . Right shoulder pain 11/16/2012  . Anxiety 04/02/2011  . Encounter for well adult exam with abnormal findings 03/31/2011  . CAD (coronary artery disease)   . Benign essential HTN   . Hyperlipidemia   . Degenerative joint disease   . Tobacco abuse 12/22/2009    Willow Ora, PTA, [facility] [address], [place], [place] [address] [phone] 06/06/16, 10:12 AM   Name: Elizabeth [name] MRN: [id number] Date of Birth: June 16, 1951

## 2016-06-05 NOTE — Therapy (Signed)
[facility] [address] [place], [place], [address] Phone: [phone]   Fax:  [phone]  Occupational Therapy Treatment  Patient Details  Name: Elizabeth [name] MRN: [id number] Date of Birth: Jun 01, 1951 Referring Provider: Dr. Alysia Penna  Encounter Date: 06/05/2016      OT End of Session - 06/05/16 1125    Visit Number 2   Number of Visits 17   Date for OT Re-Evaluation 07/27/16   Authorization Type UHC, Medicare (g-code needed)   OT Start Time 1106   OT Stop Time 1145   OT Time Calculation (min) 39 min   Activity Tolerance Patient tolerated treatment well   Behavior During Therapy [facility] for tasks assessed/performed      Past Medical History:  Diagnosis Date  . Abrasion or friction burn of foot and toe(s), with infection   . Allergy    soy  . Anxiety   . 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 Degenerative joint disease    Generalized  . Hyperlipidemia   . Hypertension   . Myocardial infarction ([place])   . Sickle cell trait ([employer])   . Tobacco abuse April, 2011   Continued    Past Surgical History:  Procedure Laterality Date  . ANGIOPLASTY  2006   x3 stents  . DILATION AND CURETTAGE OF UTERUS    . EP IMPLANTABLE DEVICE N/A 05/21/2016   Procedure: Loop Recorder Insertion;  Surgeon: Thompson Grayer, MD;  Location: [facility];  Service: Cardiovascular;  Laterality: N/A;  . FOOT SURGERY  2000  . LITHOTRIPSY  2008   left kidney stone and also ureteral stent placed  . TEE WITHOUT CARDIOVERSION N/A 05/21/2016   Procedure: TRANSESOPHAGEAL ECHOCARDIOGRAM (TEE);  Surgeon: Lelon Perla, MD;  Location: [facility] ENDOSCOPY;  Service: Cardiovascular;  Laterality: N/A;    There were no vitals filed for this visit.      Subjective Assessment - 06/05/16 1154    Subjective  Pt reports right shoulder pain   Pertinent History hx of falls, hx of MI x3, hx of chronic R shoulder pain, degenerative joint disease, HTN, loop recorder placement   Patient Stated Goals "to get back to normal"   Currently in Pain? Yes   Pain Score 5    Pain Location Shoulder   Pain Orientation Right   Pain Descriptors / Indicators Aching   Pain Type Chronic pain   Pain Onset More than a month ago   Pain Frequency Constant   Aggravating Factors  malpositioning   Pain Relieving Factors meds   Multiple Pain Sites No               Pt was instructed in low range yellow theraband for LUE., 10-15 reps each min-mod v.c. Placing grooved pegs in pegboard with LUE and removing, min v.c. Pt then held several pegs in hand and place 1 at a time moderate difficulty.               OT Education - 06/05/16 1117    Education Details coordination HEP review, initial low range yellow theraband HEP.   Person(s) Educated Patient   Methods Explanation;Demonstration;Handout   Comprehension Verbalized understanding;Returned demonstration          OT Short Term Goals - 05/28/16 1727      OT SHORT TERM GOAL #1   Title Pt will be independent with  inital HEP for LUE strength and coordination.--check STGs    Time 4   Period Weeks   Status New     OT SHORT TERM GOAL #2   Title Pt will improve coordination for ADLs as shown by improving time on 9-hole peg test by at least 5sec.   Baseline 26.12sec   Time 4   Period Weeks   Status New     OT SHORT TERM GOAL #3   Title Pt will improve L grip strength by at least 6lbs for ADLs and lifting tasks.   Baseline R-52lbs, L-35lbs   Time 4   Period Weeks   Status New     OT SHORT TERM GOAL #4   Title Pt will be able to carry cup with water at least 200 feet without spills.   Time 4   Period Weeks   Status New     OT SHORT TERM GOAL #5   Title --   Time --   Period --   Status --           OT Long Term Goals - 05/28/16 1735      OT LONG TERM GOAL #1    Title Pt will be independent with updated HEP--check LTGs 07/27/16   Time 8   Period Weeks   Status New     OT LONG TERM GOAL #2   Title Pt will be able to retrieve 3lb object from overhead safely with dominant LUE.   Time 8   Period Weeks   Status New     OT LONG TERM GOAL #3   Title Pt will improve L grip strength by at least 12lbs for ADLs and lifting tasks.   Baseline R-52lbs, L-35lbs   Time 8   Period Weeks   Status New     OT LONG TERM GOAL #4   Title Pt will be able to complete mod complex cooking and home maintenance tasks mod I.   Time 8   Period Weeks   Status New               Plan - 06/05/16 1158    Clinical Impression Statement Pt is progressing towards goals. She demonstrates understanding of coordination HEP and inital theraband HEP.   Rehab Potential Good   OT Frequency 2x / week   OT Duration 8 weeks   OT Treatment/Interventions Self-care/ADL training;Therapeutic exercise;DME and/or AE instruction;Parrafin;Cryotherapy;Therapist, nutritional;Therapeutic activities;Patient/family education;Balance training;Cognitive remediation/compensation;Splinting;Manual Therapy;Neuromuscular education;Fluidtherapy;Ultrasound;Moist Heat;Energy conservation;Passive range of motion;Therapeutic exercises;Visual/perceptual remediation/compensation   Plan reinforce HEP   OT Home Exercise Plan education provided:  initial hand HEP   Consulted and Agree with Plan of Care Patient      Patient will benefit from skilled therapeutic intervention in order to improve the following deficits and impairments:  Decreased activity tolerance, Decreased coordination, Decreased strength, Impaired UE functional use, Decreased cognition, Decreased balance, Decreased mobility, Impaired vision/preception  Visit Diagnosis: Hemiplegia and hemiparesis following cerebral infarction affecting left dominant side (HCC)  Other lack of coordination    Problem List Patient Active Problem  List   Diagnosis Date Noted  . 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 Facial droop   . Adjustment disorder with anxious mood   . Acute CVA (cerebrovascular accident) ([place]) 05/20/2016  . Slurred speech   . Dysarthria, post-stroke   . Sickle cell trait ([place])   . Acute embolic stroke ([place]) [id number]  . Depression   . Cervical radiculitis 05/17/2013  . Right shoulder pain 11/16/2012  .  Anxiety 04/02/2011  . Encounter for well adult exam with abnormal findings 03/31/2011  . CAD (coronary artery disease)   . Benign essential HTN   . Hyperlipidemia   . Degenerative joint disease   . Tobacco abuse 12/22/2009    RINE,KATHRYN 06/05/2016, 12:08 PM  [facility] [address] [place], [place], [address] Phone: [phone]   Fax:  [phone]  Name: Elizabeth [name] MRN: [id number] Date of Birth: [birth year]

## 2016-06-05 NOTE — Patient Instructions (Addendum)
Coordination/Hand Activities:   - Elizabeth [name] coins one at a time - Shuffle cards - Flip playing cards over 1 at a time - Squeeze putty with your whole hand - Roll out putty and then pinch with each finger and your thumb - Toss and catch ball                                            Strengthening: Resisted Flexion   Hold tubing with _left ____ arm(s) at side. Pull forward and up. Move shoulder through pain-free range of motion. Repeat __10__ times per set.  Do _1-2_ sessions per day , every other day   Strengthening: Resisted Extension   Hold tubing in __left___ hand(s), arm forward. Pull arm back, elbow straight. Repeat _10___ times per set. Do _1-2___ sessions per day, every other day.      Elbow Flexion: Resisted   With tubing held in __left____ hand(s) and other end secured under foot, curl arm up as far as possible. Repeat _10___ times per set. Do _1-2___ sessions per day, every other day.    Elbow Extension: Resisted   Sit in chair with resistive band secured at armrest (or hold with other hand) and _left_____ elbow bent. Straighten elbow. Repeat _10___ times per set.  Do _1-2___ sessions per day, every other day.   Copyright  VHI. All rights reserved.

## 2016-06-11 ENCOUNTER — Encounter: Payer: Self-pay | Admitting: *Deleted

## 2016-06-11 ENCOUNTER — Ambulatory Visit: Payer: 59 | Admitting: *Deleted

## 2016-06-11 DIAGNOSIS — R278 Other lack of coordination: Secondary | ICD-10-CM | POA: Diagnosis not present

## 2016-06-11 DIAGNOSIS — R2681 Unsteadiness on feet: Secondary | ICD-10-CM | POA: Diagnosis not present

## 2016-06-11 DIAGNOSIS — I69352 Hemiplegia and hemiparesis following cerebral infarction affecting left dominant side: Secondary | ICD-10-CM

## 2016-06-11 DIAGNOSIS — R42 Dizziness and giddiness: Secondary | ICD-10-CM | POA: Diagnosis not present

## 2016-06-11 DIAGNOSIS — R2689 Other abnormalities of gait and mobility: Secondary | ICD-10-CM | POA: Diagnosis not present

## 2016-06-11 NOTE — Therapy (Signed)
[facility] [address] [place], [place], [address] Phone: [phone]   Fax:  [phone]  Occupational Therapy Treatment  Patient Details  Name: Elizabeth [name] MRN: [id number] Date of Birth: 08/07/1951 Referring Provider: Dr. Alysia Penna  Encounter Date: 06/11/2016      OT End of Session - 06/11/16 1106    Visit Number 3   Number of Visits 17   Date for OT Re-Evaluation 07/27/16   Authorization Type UHC, Medicare (g-code needed)   Authorization - Visit Number 3   Authorization - Number of Visits 10   OT Start Time [id number]   OT Stop Time 1059   OT Time Calculation (min) 41 min   Activity Tolerance Patient tolerated treatment well   Behavior During Therapy [facility] for tasks assessed/performed      Past Medical History:  Diagnosis Date  . Abrasion or friction burn of foot and toe(s), with infection   . Allergy    soy  . Anxiety   . 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 Degenerative joint disease    Generalized  . Hyperlipidemia   . Hypertension   . Myocardial infarction ([place])   . Sickle cell trait ([place])   . Tobacco abuse April, 2011   Continued    Past Surgical History:  Procedure Laterality Date  . ANGIOPLASTY  2006   x3 stents  . DILATION AND CURETTAGE OF UTERUS    . EP IMPLANTABLE DEVICE N/A 05/21/2016   Procedure: Loop Recorder Insertion;  Surgeon: Thompson Grayer, MD;  Location: [place] [facility];  Service: Cardiovascular;  Laterality: N/A;  . FOOT SURGERY  2000  . LITHOTRIPSY  2008   left kidney stone and also ureteral stent placed  . TEE WITHOUT CARDIOVERSION N/A 05/21/2016   Procedure: TRANSESOPHAGEAL ECHOCARDIOGRAM (TEE);  Surgeon: Lelon Perla, MD;  Location: [facility] ENDOSCOPY;  Service: Cardiovascular;  Laterality: N/A;    There were no vitals filed for this visit.      Subjective Assessment -  06/11/16 1115    Subjective  Pt reports R shoulder pain "It's bothered me for years before this" LUE is affected by CVA - pt reports that she does not consistently perform home program.                      OT Treatments/Exercises (OP) - 06/11/16 0001      ADLs   ADL Comments Discussed ADL's using LUE - pt reports overall that she takes increased time for tasks especially FM related - brushing teeth, hair, handwriting, buttons, opeing containers etc.     Fine Motor Coordination   Fine Motor Coordination In hand manipuation training;Flipping cards;Dealing card with thumb;Picking up coins;Manipulating coins;Stacking coins;Tossing ball   Flipping cards LUE at tabletop   Dealing card with thumb Left hand min difficulty and vc's for positioning of LUE.   Picking up coins Left hand; vc's for positioning and using left hand only   Manipulating coins Left hand w/ vc's   Stacking coins Lfet hand, stacking in piles of 5 with moderate difficulty   Other Fine Motor Exercises Signing name, writing address etc w/ LUE. Min difficulty with signature and handwriting tasks in general.     Neurological Re-education Exercises   Other Exercises 1 Review & perform low range yellow theraband exercises for LUE, 10-15 reps each w/ min-mod vc's.  Functional Reaching Activities   Mid Level 3 point Pinch to remove graded clothes pins from vertical surface L hand   High Level See above                OT Education - 06/11/16 1105    Education provided Yes   Education Details Cont with home program w/ focus on LUE strengthening w/ yellow theraband, coordination and putty ex's   Person(s) Educated Patient   Methods Explanation;Demonstration   Comprehension Verbalized understanding          OT Short Term Goals - 05/28/16 1727      OT SHORT TERM GOAL #1   Title Pt will be independent with inital HEP for LUE strength and coordination.--check STGs    Time 4   Period Weeks   Status  New     OT SHORT TERM GOAL #2   Title Pt will improve coordination for ADLs as shown by improving time on 9-hole peg test by at least 5sec.   Baseline 26.12sec   Time 4   Period Weeks   Status New     OT SHORT TERM GOAL #3   Title Pt will improve L grip strength by at least 6lbs for ADLs and lifting tasks.   Baseline R-52lbs, L-35lbs   Time 4   Period Weeks   Status New     OT SHORT TERM GOAL #4   Title Pt will be able to carry cup with water at least 200 feet without spills.   Time 4   Period Weeks   Status New     OT SHORT TERM GOAL #5   Title --   Time --   Period --   Status --           OT Long Term Goals - 05/28/16 1735      OT LONG TERM GOAL #1   Title Pt will be independent with updated HEP--check LTGs 07/27/16   Time 8   Period Weeks   Status New     OT LONG TERM GOAL #2   Title Pt will be able to retrieve 3lb object from overhead safely with dominant LUE.   Time 8   Period Weeks   Status New     OT LONG TERM GOAL #3   Title Pt will improve L grip strength by at least 12lbs for ADLs and lifting tasks.   Baseline R-52lbs, L-35lbs   Time 8   Period Weeks   Status New     OT LONG TERM GOAL #4   Title Pt will be able to complete mod complex cooking and home maintenance tasks mod I.   Time 8   Period Weeks   Status New               Plan - 06/11/16 1107    Clinical Impression Statement Pt is progressing toward goals w/ LUE and should benefit from consistent performance at home. Suggestions for coordinaiton ex's for home and theraband ex's reviewed.    Rehab Potential Good   OT Frequency 2x / week   OT Duration 8 weeks   OT Treatment/Interventions Self-care/ADL training;Therapeutic exercise;DME and/or AE instruction;Parrafin;Cryotherapy;Therapist, nutritional;Therapeutic activities;Patient/family education;Balance training;Cognitive remediation/compensation;Splinting;Manual Therapy;Neuromuscular education;Fluidtherapy;Ultrasound;Moist  Heat;Energy conservation;Passive range of motion;Therapeutic exercises;Visual/perceptual remediation/compensation   Plan Consider visual/perceptual tasks in therapeutic environment vs tabletop, LUE coordinaiton & low level theraband ex's LUE. ?Begin assessing STG's.   OT Home Exercise Plan education provided:  initial hand HEP   Consulted  and Agree with Plan of Care Patient      Patient will benefit from skilled therapeutic intervention in order to improve the following deficits and impairments:  Decreased activity tolerance, Decreased coordination, Decreased strength, Impaired UE functional use, Decreased cognition, Decreased balance, Decreased mobility, Impaired vision/preception  Visit Diagnosis: Hemiplegia and hemiparesis following cerebral infarction affecting left dominant side (HCC)  Other lack of coordination    Problem List Patient Active Problem List   Diagnosis Date Noted  . 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 Facial droop   . Adjustment disorder with anxious mood   . Acute CVA (cerebrovascular accident) ([place]) 05/20/2016  . Slurred speech   . Dysarthria, post-stroke   . Sickle cell trait ([place])   . Acute embolic stroke ([place]) [id number]  . Depression   . Cervical radiculitis 05/17/2013  . Right shoulder pain 11/16/2012  . Anxiety 04/02/2011  . Encounter for well adult exam with abnormal findings 03/31/2011  . CAD (coronary artery disease)   . Benign essential HTN   . Hyperlipidemia   . Degenerative joint disease   . Tobacco abuse 12/22/2009    Almyra Deforest , OTR/L 06/11/2016, 11:16 AM  [facility] [address] [place], [place], [address] Phone: [phone]   Fax:  [phone]  Name: Myoshi Giering MRN: [id number] Date of Birth: 1950-12-03

## 2016-06-13 ENCOUNTER — Ambulatory Visit: Payer: 59 | Admitting: Occupational Therapy

## 2016-06-13 ENCOUNTER — Encounter: Payer: 59 | Attending: Physical Medicine & Rehabilitation

## 2016-06-13 ENCOUNTER — Encounter: Payer: Self-pay | Admitting: Physical Medicine & Rehabilitation

## 2016-06-13 ENCOUNTER — Ambulatory Visit (HOSPITAL_BASED_OUTPATIENT_CLINIC_OR_DEPARTMENT_OTHER): Payer: 59 | Admitting: Physical Medicine & Rehabilitation

## 2016-06-13 VITALS — BP 128/80 | HR 61 | Resp 14

## 2016-06-13 DIAGNOSIS — R278 Other lack of coordination: Secondary | ICD-10-CM

## 2016-06-13 DIAGNOSIS — I639 Cerebral infarction, unspecified: Secondary | ICD-10-CM

## 2016-06-13 DIAGNOSIS — G8929 Other chronic pain: Secondary | ICD-10-CM | POA: Diagnosis not present

## 2016-06-13 DIAGNOSIS — M25511 Pain in right shoulder: Secondary | ICD-10-CM | POA: Insufficient documentation

## 2016-06-13 DIAGNOSIS — M67911 Unspecified disorder of synovium and tendon, right shoulder: Secondary | ICD-10-CM | POA: Insufficient documentation

## 2016-06-13 DIAGNOSIS — I69354 Hemiplegia and hemiparesis following cerebral infarction affecting left non-dominant side: Secondary | ICD-10-CM | POA: Insufficient documentation

## 2016-06-13 DIAGNOSIS — I6350 Cerebral infarction due to unspecified occlusion or stenosis of unspecified cerebral artery: Secondary | ICD-10-CM | POA: Diagnosis not present

## 2016-06-13 DIAGNOSIS — R42 Dizziness and giddiness: Secondary | ICD-10-CM | POA: Diagnosis not present

## 2016-06-13 DIAGNOSIS — R2681 Unsteadiness on feet: Secondary | ICD-10-CM | POA: Diagnosis not present

## 2016-06-13 DIAGNOSIS — R2689 Other abnormalities of gait and mobility: Secondary | ICD-10-CM | POA: Diagnosis not present

## 2016-06-13 DIAGNOSIS — I69352 Hemiplegia and hemiparesis following cerebral infarction affecting left dominant side: Secondary | ICD-10-CM | POA: Diagnosis not present

## 2016-06-13 DIAGNOSIS — M75101 Unspecified rotator cuff tear or rupture of right shoulder, not specified as traumatic: Secondary | ICD-10-CM | POA: Diagnosis not present

## 2016-06-13 DIAGNOSIS — F17210 Nicotine dependence, cigarettes, uncomplicated: Secondary | ICD-10-CM | POA: Diagnosis not present

## 2016-06-13 NOTE — Patient Instructions (Addendum)

## 2016-06-13 NOTE — Therapy (Signed)
[facility] [address] [place], [place], [address] Phone: [phone]   Fax:  [phone]  Occupational Therapy Treatment  Patient Details  Name: Elizabeth [name] MRN: [id number] Date of Birth: 01-30-1951 Referring Provider: Dr. Alysia Penna  Encounter Date: 06/13/2016      OT End of Session - 06/13/16 1437    Visit Number 4   Number of Visits 17   Date for OT Re-Evaluation 07/27/16   Authorization Type UHC, Medicare (g-code needed)   Authorization - Visit Number 4   Authorization - Number of Visits 10   OT Start Time 1400   OT Stop Time 1443   OT Time Calculation (min) 43 min   Activity Tolerance Patient tolerated treatment well      Past Medical History:  Diagnosis Date  . Abrasion or friction burn of foot and toe(s), with infection   . Allergy    soy  . Anxiety   . 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 Degenerative joint disease    Generalized  . Hyperlipidemia   . Hypertension   . Myocardial infarction ([place])   . Sickle cell trait ([place])   . Tobacco abuse April, 2011   Continued    Past Surgical History:  Procedure Laterality Date  . ANGIOPLASTY  2006   x3 stents  . DILATION AND CURETTAGE OF UTERUS    . EP IMPLANTABLE DEVICE N/A 05/21/2016   Procedure: Loop Recorder Insertion;  Surgeon: Thompson [place]er, MD;  Location: [facility];  Service: Cardiovascular;  Laterality: N/A;  . FOOT SURGERY  2000  . LITHOTRIPSY  2008   left kidney stone and also ureteral stent placed  . TEE WITHOUT CARDIOVERSION N/A 05/21/2016   Procedure: TRANSESOPHAGEAL ECHOCARDIOGRAM (TEE);  Surgeon: Lelon Perla, MD;  Location: [facility] ENDOSCOPY;  Service: Cardiovascular;  Laterality: N/A;    There were no vitals filed for this visit.      Subjective Assessment - 06/13/16 1402    Subjective  The Dr. gave me a shot today  for the Rt shoulder and some exercises to do (however LUE affected by CVA).  Pt also reports that even though she is Lt handed, she uses Rt hand for certain tasks   Patient is accompained by: Family member   Pertinent History hx of falls, hx of MI x3, hx of chronic R shoulder pain, degenerative joint disease, HTN, loop recorder placement   Patient Stated Goals "to get back to normal"   Currently in Pain? No/denies                      OT Treatments/Exercises (OP) - 06/13/16 0001      ADLs   Writing Practiced pre-writing ex's for distal control including: tracing letters and distal finger control worksheet     Exercises   Exercises Shoulder     Shoulder Exercises: ROM/Strengthening   Other ROM/Strengthening Exercises Reviewed previously issued theraband HEP for LUE - Pt demo each x 10 reps. Pt still required min cues for correct positioning/reinforcement     Visual/Perceptual Exercises   Copy this Image Pegboard   Pegboard copying small peg design for visual/perceptual skills as well as coordination Lt hand. Pt with min drops Lt hand. (pt reports that she would typically use Rt hand for this task even prior to CVA. Pt reports she is Lt handed,  but uses Rt hand for certain tasks like this). Pt copying design with 100% accuracy                  OT Short Term Goals - 05/28/16 1727      OT SHORT TERM GOAL #1   Title Pt will be independent with inital HEP for LUE strength and coordination.--check STGs    Time 4   Period Weeks   Status New     OT SHORT TERM GOAL #2   Title Pt will improve coordination for ADLs as shown by improving time on 9-hole peg test by at least 5sec.   Baseline 26.12sec   Time 4   Period Weeks   Status New     OT SHORT TERM GOAL #3   Title Pt will improve L grip strength by at least 6lbs for ADLs and lifting tasks.   Baseline R-52lbs, L-35lbs   Time 4   Period Weeks   Status New     OT SHORT TERM GOAL #4   Title Pt will be able  to carry cup with water at least 200 feet without spills.   Time 4   Period Weeks   Status New     OT SHORT TERM GOAL #5   Title --   Time --   Period --   Status --           OT Long Term Goals - 05/28/16 1735      OT LONG TERM GOAL #1   Title Pt will be independent with updated HEP--check LTGs 07/27/16   Time 8   Period Weeks   Status New     OT LONG TERM GOAL #2   Title Pt will be able to retrieve 3lb object from overhead safely with dominant LUE.   Time 8   Period Weeks   Status New     OT LONG TERM GOAL #3   Title Pt will improve L grip strength by at least 12lbs for ADLs and lifting tasks.   Baseline R-52lbs, L-35lbs   Time 8   Period Weeks   Status New     OT LONG TERM GOAL #4   Title Pt will be able to complete mod complex cooking and home maintenance tasks mod I.   Time 8   Period Weeks   Status New               Plan - 06/13/16 1439    Clinical Impression Statement Pt reports handwriting is getting a little bit better.    Rehab Potential Good   OT Frequency 2x / week   OT Duration 8 weeks   OT Treatment/Interventions Self-care/ADL training;Therapeutic exercise;DME and/or AE instruction;Parrafin;Cryotherapy;Therapist, nutritional;Therapeutic activities;Patient/family education;Balance training;Cognitive remediation/compensation;Splinting;Manual Therapy;Neuromuscular education;Fluidtherapy;Ultrasound;Moist Heat;Energy conservation;Passive range of motion;Therapeutic exercises;Visual/perceptual remediation/compensation   Plan Lt grip strength, continue coordination and LUE reaching in standing (following session: environmental scanning with physical task in prep for driving)    OT Home Exercise Plan education provided:  initial hand HEP   Consulted and Agree with Plan of Care Patient      Patient will benefit from skilled therapeutic intervention in order to improve the following deficits and impairments:  Decreased activity tolerance,  Decreased coordination, Decreased strength, Impaired UE functional use, Decreased cognition, Decreased balance, Decreased mobility, Impaired vision/preception  Visit Diagnosis: Hemiplegia and hemiparesis following cerebral infarction affecting left dominant side (HCC)  Other lack of coordination    Problem List Patient Active Problem List  Diagnosis Date Noted  . Dysfunction of right rotator cuff 06/13/2016  . 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 Facial droop   . Adjustment disorder with anxious mood   . Acute CVA (cerebrovascular accident) ([place]) 05/20/2016  . Slurred speech   . Dysarthria, post-stroke   . Sickle cell trait ([place])   . Acute embolic stroke ([place]) [id number]  . Depression   . Cervical radiculitis 05/17/2013  . Right shoulder pain 11/16/2012  . Anxiety 04/02/2011  . Encounter for well adult exam with abnormal findings 03/31/2011  . CAD (coronary artery disease)   . Benign essential HTN   . Hyperlipidemia   . Degenerative joint disease   . Tobacco abuse 12/22/2009    Carey Bullocks, OTR/L 06/13/2016, 2:47 PM  [facility] [address] [place], [place], [address] Phone: [phone]   Fax:  [phone]  Name: Elizabeth [name] MRN: [id number] Date of Birth: 11/03/1950

## 2016-06-13 NOTE — Progress Notes (Signed)
Subjective:    Patient ID: Elizabeth [name], female    DOB: 11-09-1950, 65 y.o.   MRN: [id number]  HPI [id number] shoulder pain Patient had right corona radiata, right pontine and right internal capsule infarcts. Onset 05/19/2016, patient completed inpatient rehabilitation and was there from 8/31 to 9/12-2015 Right-sided shoulder and neck pain has gone on for several years, started while she was working at a [employer]. Since her stroke she's had some left-sided weakness and is using her right shoulder more. It is aggravated pain. She is currently receiving outpatient PT and OT at neuro rehabilitation. She is modified independent, bathing and dressing, as well as with ambulation. Does not use assistive device or orthotics.  Has seen PCP  Pain Inventory Average Pain 0 Pain Right Now 0 My pain is no pain  In the last 24 hours, has pain interfered with the following? General activity 0 Relation with others 0 Enjoyment of life 0 What TIME of day is your pain at its worst? no pain Sleep (in general) Good  Pain is worse with: no pain Pain improves with: no pain Relief from Meds: no pain  Mobility walk without assistance  Function retired  Neuro/Psych No problems in this area  Prior Studies Any changes since last visit?  no hospital f/u  Physicians involved in your care Any changes since last visit?  no hospital f/u   Family History  Problem Relation Age of Onset  . Coronary artery disease Mother   . Coronary artery disease Father    Social History   Social History  . Marital status: Married    Spouse name: N/A  . Number of children: N/A  . Years of education: N/A   Social History Main Topics  . Smoking status: Current Some Day Smoker    Types: Cigarettes  . Smokeless tobacco: Never Used     Comment: Ongoing since 04/2005  . Alcohol use No  . Drug use: No  . Sexual activity: Not Asked   Other Topics Concern  . None   Social History Narrative  . None    Past Surgical History:  Procedure Laterality Date  . ANGIOPLASTY  2006   x3 stents  . DILATION AND CURETTAGE OF UTERUS    . EP IMPLANTABLE DEVICE N/A 05/21/2016   Procedure: Loop Recorder Insertion;  Surgeon: Thompson [place]er, MD;  Location: [facility];  Service: Cardiovascular;  Laterality: N/A;  . FOOT SURGERY  2000  . LITHOTRIPSY  2008   left kidney stone and also ureteral stent placed  . TEE WITHOUT CARDIOVERSION N/A 05/21/2016   Procedure: TRANSESOPHAGEAL ECHOCARDIOGRAM (TEE);  Surgeon: Lelon Perla, MD;  Location[facility];  Service: Cardiovascular;  Laterality: N/A;   Past Medical History:  Diagnosis Date  . Abrasion or friction burn of foot and toe(s), with infection   . Allergy    soy  . Anxiety   . 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 Degenerative joint disease    Generalized  . Hyperlipidemia   . Hypertension   . Myocardial infarction ([place])   . Sickle cell trait ([place])   . Tobacco abuse April, 2011   Continued   BP 128/80 (BP Location: Left Arm, Patient Position: Sitting, Cuff Size: Normal)   Pulse 61   Resp 14   SpO2 96%   Opioid Risk Score:   Fall Risk Score:  `1  Depression screen PHQ 2/9  Depression screen [facility] 2/9 06/13/2016 05/29/2016 05/17/2013  Decreased Interest 0 0 0  Down, Depressed, Hopeless 0 0 0  PHQ - 2 Score 0 0 0  Altered sleeping 0 - -  Tired, decreased energy 1 - -  Change in appetite 0 - -  Feeling bad or failure about yourself  0 - -  Trouble concentrating 0 - -  Moving slowly or fidgety/restless 0 - -  Suicidal thoughts 0 - -  PHQ-9 Score 1 - -  Difficult doing work/chores Not difficult at all - -    Review of Systems  Constitutional: Negative.   HENT: Negative.   Eyes: Negative.   Respiratory: Negative.   Cardiovascular: Negative.   Gastrointestinal: Negative.   Endocrine: Negative.   Genitourinary: Negative.     Musculoskeletal: Negative.   Skin: Negative.   Allergic/Immunologic: Negative.   Neurological: Negative.   Hematological: Negative.   Psychiatric/Behavioral: Negative.   All other systems reviewed and are negative.      Objective:   Physical Exam  Constitutional: She is oriented to person, place, and time. She appears well-developed and well-nourished.  HENT:  Head: Normocephalic and atraumatic.  Eyes: Conjunctivae and EOM are normal. Pupils are equal, round, and reactive to light.  Neck: Normal range of motion.  Pulmonary/Chest: No respiratory distress.  Musculoskeletal:       Right shoulder: She exhibits pain. She exhibits normal range of motion, no tenderness, no effusion, no crepitus and no deformity.       Right elbow: Normal.      Right wrist: Normal.  No tenderness. Palpation the subacromial area over the before meals joint area, mild tenderness over the right trapezius area. She has positive impingement signs. No pain with adduction of the right shoulder, negative apprehension sign No pain with external rotation. Mildly positive drop arm sign, not consistent  No pain with cervical spine range of motion. Negative Spurling's maneuver. Full range of motion in the cervical spine  Neurological: She is alert and oriented to person, place, and time. Coordination and gait abnormal.  4/5 in the left deltoid, biceps, triceps, grip, hip flexor, knee extensor, ankle dorsiflexor and plantar flexor 5/5 in the right deltoid, biceps, triceps, grip, hip flexors, knee extensor, ankle dorsi flexion, plantar flexor Sensation intact to light touch in bilateral upper and lower limbs Patient has stifflegged gait, left lower extremity, mildly diminished heel strike Positive dysdiadochokinesis with rapid alternating supination, pronation of the left upper extremity.  Nursing note and vitals reviewed.         Assessment & Plan:  1. Right subcortical infarct causing left hemiparesis and left  fine motor deficits as well as gait disorder. I do agree with continued outpatient PT, OT. I do think she is using the right arm more often than left arm, which is aggravating. Chronic shoulder pain  2. Right chronic shoulder pain. Exam was consistent with rotator cuff syndrome exacerbated by the recent CVA causing left hemiparesis  Shoulder injection, right Without ultrasound guidance  Indication: Right  Shoulder pain not relieved by medication management and other conservative care.  Informed consent was obtained after describing risks and benefits of the procedure with the patient, this includes bleeding, bruising, infection and medication side effects. The patient wishes to proceed and has given written consent. Patient was placed in a seated position. The Right shoulder was marked and prepped with betadine in the subacromial area. A 25-gauge 1-1/2 inch needle was inserted into the subacromial  area. After negative draw back for blood, a solution containing 1 mL of 6 mg per ML betamethasone and 4 mL of 1% lidocaine was injected. A band aid was applied. The patient tolerated the procedure well. Post procedure instructions were given.

## 2016-06-16 DIAGNOSIS — G8194 Hemiplegia, unspecified affecting left nondominant side: Secondary | ICD-10-CM

## 2016-06-18 ENCOUNTER — Ambulatory Visit: Payer: 59 | Admitting: Occupational Therapy

## 2016-06-18 ENCOUNTER — Encounter: Payer: Self-pay | Admitting: Physical Therapy

## 2016-06-18 ENCOUNTER — Ambulatory Visit: Payer: 59 | Admitting: Physical Therapy

## 2016-06-18 DIAGNOSIS — R278 Other lack of coordination: Secondary | ICD-10-CM | POA: Diagnosis not present

## 2016-06-18 DIAGNOSIS — R2681 Unsteadiness on feet: Secondary | ICD-10-CM

## 2016-06-18 DIAGNOSIS — I69352 Hemiplegia and hemiparesis following cerebral infarction affecting left dominant side: Secondary | ICD-10-CM

## 2016-06-18 DIAGNOSIS — R2689 Other abnormalities of gait and mobility: Secondary | ICD-10-CM

## 2016-06-18 DIAGNOSIS — R42 Dizziness and giddiness: Secondary | ICD-10-CM

## 2016-06-18 NOTE — Therapy (Signed)
Acute embolic stroke (HCC) 05/19/2016  . Depression   . Cervical radiculitis 05/17/2013  . Right shoulder pain 11/16/2012  . Anxiety 04/02/2011  . Encounter for well adult exam with abnormal findings 03/31/2011  . CAD (coronary artery disease)   . Benign essential HTN   . Hyperlipidemia   . Degenerative joint disease   . Tobacco abuse 12/22/2009    ,  Johnson, OTR/L 06/18/2016, 10:09 AM  [name] [address] [place], [place], [address] Phone: [phone]   Fax:  [phone]  Name: Elizabeth [name] MRN: [id number] Date of Birth: 03/24/1951    [facility] [address] [place], [place], [address] Phone: [phone]   Fax:  [phone]  Occupational Therapy Treatment  Patient Details  Name: Elizabeth [name] MRN: [id number] Date of Birth: 06/25/1951 Referring Provider: Dr. Andrew Kirsteins  Encounter Date: 06/18/2016      OT End of Session - 06/18/16 1006    Visit Number 5   Number of Visits 17   Date for OT Re-Evaluation 07/27/16   Authorization Type UHC, Medicare (g-code needed)   Authorization - Visit Number 5   Authorization - Number of Visits 10   OT Start Time 0930   OT Stop Time 1013   OT Time Calculation (min) 43 min   Activity Tolerance Patient tolerated treatment well      Past Medical History:  Diagnosis Date  . Abrasion or friction burn of foot and toe(s), with infection   . Allergy    soy  . Anxiety   . 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 Degenerative joint disease    Generalized  . Hyperlipidemia   . Hypertension   . Myocardial infarction (HCC)   . Sickle cell trait (HCC)   . Tobacco abuse April, 2011   Continued    Past Surgical History:  Procedure Laterality Date  . ANGIOPLASTY  2006   x3 stents  . DILATION AND CURETTAGE OF UTERUS    . EP IMPLANTABLE DEVICE N/A 05/21/2016   Procedure: Loop Recorder Insertion;  Surgeon: James Allred, MD;  [facility];  Service: Cardiovascular;  Laterality: N/A;  . FOOT SURGERY  2000  . LITHOTRIPSY  2008   left kidney stone and also ureteral stent placed  . TEE WITHOUT CARDIOVERSION N/A 05/21/2016   Procedure: TRANSESOPHAGEAL ECHOCARDIOGRAM (TEE);  Surgeon: Brian S Crenshaw, MD;  Location: [facility];  Service: Cardiovascular;  Laterality: N/A;    There were no vitals filed for this visit.      Subjective Assessment - 06/18/16 0929    Subjective  The Rt shoulder is feeling  better since the injection   Pertinent History hx of falls, hx of MI x3, hx of chronic R shoulder pain, degenerative joint disease, HTN, loop recorder placement   Patient Stated Goals "to get back to normal"   Currently in Pain? No/denies                      OT Treatments/Exercises (OP) - 06/18/16 0931      Exercises   Exercises Hand     Shoulder Exercises: ROM/Strengthening   UBE (Upper Arm Bike) X 8 min. Level 3 for UE strength/endurance.      Hand Exercises   Other Hand Exercises Gripper set at level 2 resistance to pick up blocks Lt hand for sustained grip strength. Pt with mod to max difficulty and drops. Pt required 2 short rest breaks    Other Hand Exercises Pt carrying cup of H2O Lt hand w/o spills with distractions and translating stress balls Rt hand x 200 ft. (Pt met STG #4)      Functional Reaching Activities   Mid Level Standing to place small pegs in pegboard LUE mid to high level reaching for coordination, ROM, and endurance. Pt also copying design for visual perceptual skills with 100% accuracy                    Acute embolic stroke ([place]) 42/59/5638  . Depression   . Cervical radiculitis 05/17/2013  . Right shoulder pain 11/16/2012  . Anxiety 04/02/2011  . Encounter for well adult exam with abnormal findings 03/31/2011  . CAD (coronary artery disease)   . Benign essential HTN   . Hyperlipidemia   . Degenerative joint disease   . Tobacco abuse 12/22/2009    Carey Bullocks, OTR/L 06/18/2016, 10:09 AM  [facility] [address] [place], [place], [address] Phone: [phone]   Fax:  [phone]  Name: Elizabeth [name] MRN: [id number] Date of Birth: 20-Apr-1951

## 2016-06-19 NOTE — Therapy (Signed)
[facility] [address] [place], [place], [address] Phone: [phone]   Fax:  [phone]  Physical Therapy Treatment  Patient Details  Name: Elizabeth [name] MRN: [id number] Date of Birth: 25-Aug-1951 Referring Provider: Alysia Penna, MD  Encounter Date: 06/18/2016      PT End of Session - 06/18/16 0852    Visit Number 4   Number of Visits 9  eval + 8 visits   Date for PT Re-Evaluation 06/29/16   Authorization Type MCR Traditional primary; UHC secondary   Authorization Time Period G Codes required   PT Start Time 0848   PT Stop Time 0930   PT Time Calculation (min) 42 min   Equipment Utilized During Treatment Gait belt   Activity Tolerance Patient tolerated treatment well   Behavior During Therapy WFL for tasks assessed/performed      Past Medical History:  Diagnosis Date  . Abrasion or friction burn of foot and toe(s), with infection   . Allergy    soy  . Anxiety   . 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 Degenerative joint disease    Generalized  . Hyperlipidemia   . Hypertension   . Myocardial infarction ([facility])   . Sickle cell trait ([facility])   . Tobacco abuse April, 2011   Continued    Past Surgical History:  Procedure Laterality Date  . ANGIOPLASTY  2006   x3 stents  . DILATION AND CURETTAGE OF UTERUS    . EP IMPLANTABLE DEVICE N/A 05/21/2016   Procedure: Loop Recorder Insertion;  Surgeon: Thompson Grayer, MD;  Location: [facility];  Service: Cardiovascular;  Laterality: N/A;  . FOOT SURGERY  2000  . LITHOTRIPSY  2008   left kidney stone and also ureteral stent placed  . TEE WITHOUT CARDIOVERSION N/A 05/21/2016   Procedure: TRANSESOPHAGEAL ECHOCARDIOGRAM (TEE);  Surgeon: Lelon Perla, MD;  Location: [facility] ENDOSCOPY;  Service: Cardiovascular;  Laterality: N/A;    There were no vitals filed for  this visit.      Subjective Assessment - 06/18/16 0851    Subjective No new complaints. No falls or pain to report. Reports she wore her high heels to church over the weekend without any issues.    Patient is accompained by: Family member   Pertinent History PMH significant for: CAD, MI, angioplasty with stents x3 (2006), HTN, HLD, cervical radiculitis, DJD, anxiety, depression, R shoulder pain, sickle cell trait, cardiac loop recorder (05/21/16)   Diagnostic tests CT/MRI of brain: several small acute, early subacute infarcts in R pons, R posterior limb of internal capsule, and R corona radiata   Currently in Pain? No/denies   Pain Score 0-No pain              [facility] Adult PT Treatment/Exercise - 06/18/16 0853      Transfers   Transfers Sit to Stand;Stand to Sit   Sit to Stand 6: Modified independent (Device/Increase time)   Stand to Sit 6: Modified independent (Device/Increase time)     Ambulation/Gait   Ambulation/Gait Yes   Ambulation/Gait Assistance 6: Modified independent (Device/Increase time);5: Supervision   Ambulation/Gait Assistance Details Mod I for gait indoors/outdoors   Ambulation Distance (Feet) 1000 Feet   Assistive device None   Gait Pattern Within Functional Limits   Ambulation Surface Unlevel;Level;Indoor;Outdoor;Paved;Grass   Gait Comments along a ~50 foot hallway with supervision for balance:  forward gait with head movements up<>down, left<>right and diagonals both ways x 2 laps each. minor veering of path noted                            Knee/Hip Exercises: Aerobic   Other Aerobic Scifit x 4 extremities level 2.5 x 8 mintues with goal rpm >/= 50 for strengthening and activity tolerance             Balance Exercises - 06/18/16 0907      Balance Exercises: Standing   SLS with Vectors Foam/compliant surface;Other reps (comment);Limitations   Balance Beam standing with feet across blue foam balance beam: alternating fwd heel taps x 10 reps each leg,  alternating bwd toe taps x 10 each leg, min guard to min assist for balance.   Tandem Gait Forward;Retro;Foam/compliant surface;3 reps;Limitations   Sidestepping Foam/compliant support;3 reps;Limitations     Balance Exercises: Standing   SLS with Vectors Limitations standing on doubled blue mat with 2 tall cones: alternating fwd toe taps and then cross toe taps x 10 each bil legs   Tandem Gait Limitations on blue foam balance beam with min guard assist to min assist x 3 laps each fwd/bwd   Sidestepping Limitations on blue foam beam at counter top x 3 laps each way, supervision to min guard assist with no UE support           PT Education - 06/18/16 0930    Education provided Yes   Education Details information on [employer] near pt for transition to community fitness, use of seated bikes to assist with activity tolerance build up concurrent with walking program   Person(s) Educated Patient   Methods Explanation;Demonstration;Handout   Comprehension Verbalized understanding;Returned demonstration          PT Short Term Goals - 05/30/16 0919      PT SHORT TERM GOAL #1   Title STG's = LTG's           PT Long Term Goals - 05/30/16 1735      PT LONG TERM GOAL #1   Title Pt will independently perform HEP to maximize functional gains made in PT.  (Target date: 06/27/16)     PT LONG TERM GOAL #2   Title Pt will improve FGA score from 14 to >/= 23/30 to indicate decreased fall risk.  (06/27/16)     PT LONG TERM GOAL #3   Title Pt will independently ambulate >1,000' over unlevel, paved and grass surfaces to indicate increased safety with community mobility.  (06/27/16)     PT LONG TERM GOAL #4   Title Pt will independently negotiate standard ramp and curb step to indicate increased safety traversing community obstacles.  (06/27/16)     PT LONG TERM GOAL #5   Title Manual TUG time will be within 5 seconds of Normal TUG time to indicate increased gait stability with concurrent manual  task. (06/27/16)            Plan - 06/18/16 [id number]    Clinical Impression Statement Today's session continued to address balance and strengthening working towards pt's goals. Pt is making steady progress toward goals.   Rehab Potential Good   Clinical Impairments Affecting Rehab Potential Positive: age, supportive husband   PT Frequency 2x / week   PT Duration 4 weeks   PT Treatment/Interventions ADLs/Self Care Home Management;Therapeutic activities;Functional mobility training;Stair training;Gait training;Therapeutic exercise;Balance training;Neuromuscular re-education;Patient/family education;Vestibular;Visual/perceptual remediation/compensation   PT Next  Visit Plan  Standing balance, LLE NMR, community mobility/obstacle negotiation, single leg stance activities and coordination activities   Consulted and Agree with Plan of Care Patient;Family member/caregiver   Family Member Consulted husband, Denyse Amass      Patient will benefit from skilled therapeutic intervention in order to improve the following deficits and impairments:  Decreased balance, Abnormal gait, Decreased coordination, Dizziness, Impaired vision/preception, Decreased strength, Impaired UE functional use, Pain, Other (comment) (Shoulder pain will be monitored but not directly addressed by PT due to nature of referral)  Visit Diagnosis: 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 Left hemiparesis ([facility]) 06/16/2016  . Dysfunction of right rotator cuff 06/13/2016  . 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 Facial droop   . Adjustment disorder with anxious mood   . Acute CVA (cerebrovascular accident) ([facility]) 05/20/2016  . Slurred speech   . Dysarthria, post-stroke   . Sickle cell trait ([facility])   . Acute embolic stroke  ([facility]) [id number]  . Depression   . Cervical radiculitis 05/17/2013  . Right shoulder pain 11/16/2012  . Anxiety 04/02/2011  . Encounter for well adult exam with abnormal findings 03/31/2011  . CAD (coronary artery disease)   . Benign essential HTN   . Hyperlipidemia   . Degenerative joint disease   . Tobacco abuse 12/22/2009   Willow Ora, PTA, [facility] [address] [place], [place] [address] [phone] 06/19/16, 2:10 PM   Name: Elizabeth [name] MRN: [id number] Date of Birth: Jan 30, 1951

## 2016-06-20 ENCOUNTER — Ambulatory Visit (INDEPENDENT_AMBULATORY_CARE_PROVIDER_SITE_OTHER): Payer: 59 | Admitting: *Deleted

## 2016-06-20 ENCOUNTER — Ambulatory Visit: Payer: 59 | Admitting: Occupational Therapy

## 2016-06-20 ENCOUNTER — Ambulatory Visit: Payer: 59 | Admitting: Physical Therapy

## 2016-06-20 DIAGNOSIS — I639 Cerebral infarction, unspecified: Secondary | ICD-10-CM

## 2016-06-20 DIAGNOSIS — R2681 Unsteadiness on feet: Secondary | ICD-10-CM | POA: Diagnosis not present

## 2016-06-20 DIAGNOSIS — R2689 Other abnormalities of gait and mobility: Secondary | ICD-10-CM | POA: Diagnosis not present

## 2016-06-20 DIAGNOSIS — R42 Dizziness and giddiness: Secondary | ICD-10-CM | POA: Diagnosis not present

## 2016-06-20 DIAGNOSIS — R278 Other lack of coordination: Secondary | ICD-10-CM | POA: Diagnosis not present

## 2016-06-20 DIAGNOSIS — I69352 Hemiplegia and hemiparesis following cerebral infarction affecting left dominant side: Secondary | ICD-10-CM | POA: Diagnosis not present

## 2016-06-20 NOTE — Therapy (Signed)
[facility] [address] [place], [place], [address] Phone: [phone]   Fax:  [phone]  Occupational Therapy Treatment  Patient Details  Name: Elizabeth [name] MRN: [id number] Date of Birth: 05/05/1951 Referring Provider: Dr. Andrew Kirsteins  Encounter Date: 06/20/2016      OT End of Session - 06/20/16 1119    Visit Number 6   Number of Visits 17   Date for OT Re-Evaluation 07/27/16   Authorization Type UHC, Medicare (g-code needed)   Authorization - Visit Number 6   Authorization - Number of Visits 10   OT Start Time 0930   OT Stop Time 1015   OT Time Calculation (min) 45 min   Activity Tolerance Patient tolerated treatment well      Past Medical History:  Diagnosis Date  . Abrasion or friction burn of foot and toe(s), with infection   . Allergy    soy  . Anxiety   . 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 Degenerative joint disease    Generalized  . Hyperlipidemia   . Hypertension   . Myocardial infarction (HCC)   . Sickle cell trait (HCC)   . Tobacco abuse April, 2011   Continued    Past Surgical History:  Procedure Laterality Date  . ANGIOPLASTY  2006   x3 stents  . DILATION AND CURETTAGE OF UTERUS    . EP IMPLANTABLE DEVICE N/A 05/21/2016   Procedure: Loop Recorder Insertion;  Surgeon: James Allred, MD;  [facility];  Service: Cardiovascular;  Laterality: N/A;  . FOOT SURGERY  2000  . LITHOTRIPSY  2008   left kidney stone and also ureteral stent placed  . TEE WITHOUT CARDIOVERSION N/A 05/21/2016   Procedure: TRANSESOPHAGEAL ECHOCARDIOGRAM (TEE);  Surgeon: Brian S Crenshaw, MD;  [facility];  Service: Cardiovascular;  Laterality: N/A;    There were no vitals filed for this visit.      Subjective Assessment - 06/20/16 0933    Pertinent History hx of falls, hx of MI  x3, hx of chronic R shoulder pain, degenerative joint disease, HTN, loop recorder placement   Patient Stated Goals "to get back to normal"   Currently in Pain? No/denies                      OT Treatments/Exercises (OP) - 06/20/16 0001      ADLs   ADL Comments Assessed remaining STG's and some LTG's - see goal section     Fine Motor Coordination   Fine Motor Coordination Small Pegboard   Small Pegboard Pt placing small pegs in pegboard Lt hand with little to no difficulty     Visual/Perceptual Exercises   Scanning Environmental   Scanning - Environmental Pt scanning for items in environment while translating stress balls Lt hand and id 11/13 items on first trial (85% accurate)    Other Exercises Pt assembling 24 pc puzzle with slightly extra time and min cues     Functional Reaching Activities   High Level high level reaching to place 3 lb. object on high shelf LUE 5/5 trials without rest, pain, or difficulty (met LTG #2)                  OT Short Term Goals - 06/20/16 0941      OT SHORT TERM GOAL #1     [facility] [address] [place], [place], [address] Phone: [phone]   Fax:  [phone]  Occupational Therapy Treatment  Patient Details  Name: Elizabeth [name] MRN: [id number] Date of Birth: [birth year] Referring Provider: Dr. Alysia Penna  Encounter Date: 06/20/2016      OT End of Session - 06/20/16 1119    Visit Number 6   Number of Visits 17   Date for OT Re-Evaluation 07/27/16   Authorization Type UHC, Medicare (g-code needed)   Authorization - Visit Number 6   Authorization - Number of Visits 10   OT Start Time 0930   OT Stop Time 1015   OT Time Calculation (min) 45 min   Activity Tolerance Patient tolerated treatment well      Past Medical History:  Diagnosis Date  . Abrasion or friction burn of foot and toe(s), with infection   . Allergy    soy  . Anxiety   . 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 Degenerative joint disease    Generalized  . Hyperlipidemia   . Hypertension   . Myocardial infarction ([facility])   . Sickle cell trait ([place])   . Tobacco abuse April, 2011   Continued    Past Surgical History:  Procedure Laterality Date  . ANGIOPLASTY  2006   x3 stents  . DILATION AND CURETTAGE OF UTERUS    . EP IMPLANTABLE DEVICE N/A 05/21/2016   Procedure: Loop Recorder Insertion;  Surgeon: Thompson Grayer, MD;  Location: [facility];  Service: Cardiovascular;  Laterality: N/A;  . FOOT SURGERY  2000  . LITHOTRIPSY  2008   left kidney stone and also ureteral stent placed  . TEE WITHOUT CARDIOVERSION N/A 05/21/2016   Procedure: TRANSESOPHAGEAL ECHOCARDIOGRAM (TEE);  Surgeon: Lelon Perla, MD;  Location: [facility] ENDOSCOPY;  Service: Cardiovascular;  Laterality: N/A;    There were no vitals filed for this visit.      Subjective Assessment - 06/20/16 0933    Pertinent History hx of falls, hx of MI  x3, hx of chronic R shoulder pain, degenerative joint disease, HTN, loop recorder placement   Patient Stated Goals "to get back to normal"   Currently in Pain? No/denies                      OT Treatments/Exercises (OP) - 06/20/16 0001      ADLs   ADL Comments Assessed remaining STG's and some LTG's - see goal section     Fine Motor Coordination   Fine Motor Coordination Small Pegboard   Small Pegboard Pt placing small pegs in pegboard Lt hand with little to no difficulty     Visual/Perceptual Exercises   Scanning Environmental   Scanning - Environmental Pt scanning for items in environment while translating stress balls Lt hand and id 11/13 items on first trial (85% accurate)    Other Exercises Pt assembling 24 pc puzzle with slightly extra time and min cues     Functional Reaching Activities   High Level high level reaching to place 3 lb. object on high shelf LUE 5/5 trials without rest, pain, or difficulty (met LTG #2)                  OT Short Term Goals - 06/20/16 0941      OT SHORT TERM GOAL #1  Dysarthria, post-stroke   . Sickle cell trait (HCC)   . Acute embolic stroke (HCC) 05/19/2016  . Depression   . Cervical radiculitis 05/17/2013  . Right shoulder pain 11/16/2012  . Anxiety 04/02/2011  . Encounter for well adult exam with abnormal findings 03/31/2011  . CAD (coronary artery disease)   . Benign essential HTN   . Hyperlipidemia   . Degenerative joint disease   . Tobacco abuse 12/22/2009    ,  Johnson, OTR/L 06/20/2016, 11:21 AM  [facility] [address] [place], [place], [address] Phone: [phone]   Fax:  [phone]  Name: Toye Kulesza MRN: [id number] Date of Birth: 03/10/1951  

## 2016-06-20 NOTE — Therapy (Signed)
[facility] [address] [place], [place], [address] Phone: [phone]   Fax:  [phone]  Physical Therapy Treatment and Discharge Summary  Patient Details  Name: Elizabeth [name] MRN: [id number] Date of Birth: 06/04/1951 Referring Provider: Andrew Kirsteins, MD  Encounter Date: 06/20/2016      PT End of Session - 06/20/16 1209    Visit Number 5   Number of Visits 9   Date for PT Re-Evaluation 06/29/16   Authorization Type MCR Traditional primary; UHC secondary   Authorization Time Period G Codes required   PT Start Time 0852   PT Stop Time 0918  Ended early due to all goals met, patient discharged   PT Time Calculation (min) 26 min   Activity Tolerance Patient tolerated treatment well   Behavior During Therapy WFL for tasks assessed/performed      Past Medical History:  Diagnosis Date  . Abrasion or friction burn of foot and toe(s), with infection   . Allergy    soy  . Anxiety   . 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 Degenerative joint disease    Generalized  . Hyperlipidemia   . Hypertension   . Myocardial infarction (HCC)   . Sickle cell trait (HCC)   . Tobacco abuse April, 2011   Continued    Past Surgical History:  Procedure Laterality Date  . ANGIOPLASTY  2006   x3 stents  . DILATION AND CURETTAGE OF UTERUS    . EP IMPLANTABLE DEVICE N/A 05/21/2016   Procedure: Loop Recorder Insertion;  Surgeon: James Allred, MD;  [facility]: [facility];  Service: Cardiovascular;  Laterality: N/A;  . FOOT SURGERY  2000  . LITHOTRIPSY  2008   left kidney stone and also ureteral stent placed  . TEE WITHOUT CARDIOVERSION N/A 05/21/2016   Procedure: TRANSESOPHAGEAL ECHOCARDIOGRAM (TEE);  Surgeon: Brian S Crenshaw, MD;  Location: [facility] ENDOSCOPY;  Service: Cardiovascular;  Laterality: N/A;    There were no vitals  filed for this visit.      Subjective Assessment - 06/20/16 0854    Subjective "I almost feel normal again." Pt reports no falls, no significant changes.    Pertinent History PMH significant for: CAD, MI, angioplasty with stents x3 (2006), HTN, HLD, cervical radiculitis, DJD, anxiety, depression, R shoulder pain, sickle cell trait, cardiac loop recorder (05/21/16)   Diagnostic tests CT/MRI of brain: several small acute, early subacute infarcts in R pons, R posterior limb of internal capsule, and R corona radiata   Patient Stated Goals "To get better at what I need to do around the house...to get my balance and to make sure I don't spill things."   Currently in Pain? No/denies            [facility] PT Assessment - 06/20/16 0001      Functional Gait  Assessment   Gait assessed  Yes   Gait Level Surface Walks 20 ft in less than 5.5 sec, no assistive devices, good speed, no evidence for imbalance, normal gait pattern, deviates no more than 6 in outside of the 12 in walkway width.   Change in Gait Speed Able to smoothly change walking speed without loss of balance or gait deviation. Deviate no more than 6 in outside of the 12 in walkway width.   Gait with Horizontal Head Turns Performs head turns smoothly with no change in   gait. Deviates no more than 6 in outside 12 in walkway width   Gait with Vertical Head Turns Performs head turns with no change in gait. Deviates no more than 6 in outside 12 in walkway width.   Gait and Pivot Turn Pivot turns safely within 3 sec and stops quickly with no loss of balance.   Step Over Obstacle Is able to step over one shoe box (4.5 in total height) without changing gait speed. No evidence of imbalance.   Gait with Narrow Base of Support Ambulates 4-7 steps.   Gait with Eyes Closed Walks 20 ft, uses assistive device, slower speed, mild gait deviations, deviates 6-10 in outside 12 in walkway width. Ambulates 20 ft in less than 9 sec but greater than 7 sec.    Ambulating Backwards Walks 20 ft, no assistive devices, good speed, no evidence for imbalance, normal gait   Steps Alternating feet, no rail.   Total Score 26                     [facility] Adult PT Treatment/Exercise - 06/20/16 0001      Transfers   Transfers Sit to Stand;Stand to Sit   Sit to Stand 7: Independent   Stand to Sit 7: Independent     Ambulation/Gait   Ambulation/Gait Yes   Ambulation/Gait Assistance 7: Independent   Ambulation Distance (Feet) (P)  1345 Feet   Assistive device None   Gait Pattern Within Functional Limits   Ambulation Surface Level;Unlevel;Indoor;Outdoor;Paved;Grass   Gait velocity 3.96 ft/sec   Stairs Yes   Stairs Assistance 7: Independent   Stair Management Technique No rails   Number of Stairs 4   Height of Stairs 6   Ramp 7: Independent   Curb 7: Independent   Gait Comments (P)  Gait x115' while tossing/catching ball, then x230' over level, indoor surfaces while concurrently holding tennis ball on racket with noted improved ability to dual-task during functional mobility.                PT Education - 06/20/16 1053    Education provided Yes   Education Details Goals, findings, progress, and DC plan.   Person(s) Educated Patient   Methods Explanation   Comprehension Verbalized understanding          PT Short Term Goals - 05/30/16 0919      PT SHORT TERM GOAL #1   Title STG's = LTG's           PT Long Term Goals - 06/20/16 0915      PT LONG TERM GOAL #1   Title Pt will independently perform HEP to maximize functional gains made in PT.  (Target date: 06/27/16)   Baseline Met 9/28.   Status Achieved     PT LONG TERM GOAL #2   Title Pt will improve FGA score from 14 to >/= 23/30 to indicate decreased fall risk.  (06/27/16)   Baseline 9/28: FGA = 26/30   Status Achieved     PT LONG TERM GOAL #3   Title Pt will independently ambulate >1,000' over unlevel, paved and grass surfaces to indicate increased safety  with community mobility.  (06/27/16)   Baseline Met 9/28.   Status Achieved     PT LONG TERM GOAL #4   Title Pt will independently negotiate standard ramp and curb step to indicate increased safety traversing community obstacles.  (06/27/16)   Baseline Met 9/28.   Status Achieved     PT   LONG TERM GOAL #5   Title Manual TUG time will be within 5 seconds of Normal TUG time to indicate increased gait stability with concurrent manual task. (06/27/16)   Baseline Normal TUG = 8.09sec; Manual TUG = 9.17sec   Status Achieved               Plan - 15-Jul-2016 1210    Clinical Impression Statement Pt has met all LTG's, indicating independence with community mobility over varying surface types. FGA improved from 14/30 to 26/30, suggesting decreased fall risk and significant improvement in dynamic gait stability. Pt therefore will be discharge from outpatient PT at this time. Pt verbalized understanding and was in full agreement with DC plan.    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 Left hemiparesis ([place]) 06/16/2016  . Dysfunction of right rotator cuff 06/13/2016  . 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 Facial droop   . Adjustment disorder with anxious mood   . Acute  CVA (cerebrovascular accident) ([place]) 05/20/2016  . Slurred speech   . Dysarthria, post-stroke   . Sickle cell trait ([place])   . Acute embolic stroke ([place]) 46/50/3546  . Depression   . Cervical radiculitis 05/17/2013  . Right shoulder pain 11/16/2012  . Anxiety 04/02/2011  . Encounter for well adult exam with abnormal findings 03/31/2011  . CAD (coronary artery disease)   . Benign essential HTN   . Hyperlipidemia   . Degenerative joint disease   . Tobacco abuse 12/22/2009    PHYSICAL THERAPY DISCHARGE SUMMARY  Visits from Start of Care: 5  Current functional level related to goals / functional outcomes: See above.   Remaining deficits: No significant functional mobility deficits at this time.    Education / Equipment: See above.  Plan: Patient agrees to discharge.  Patient goals were met. Patient is being discharged due to meeting the stated rehab goals.  ?????         Billie Ruddy, PT, DPT [facility] [address] [place], [place], [address] Phone: [phone]   Fax:  [phone] 2016-07-15, 12:18 PM   Name: Eisa Conaway MRN: [id number] Date of Birth: 1951-02-12

## 2016-06-21 NOTE — Progress Notes (Signed)
[facility] Summary Report / Loop Recorder 

## 2016-06-25 ENCOUNTER — Ambulatory Visit: Payer: Medicare Other | Admitting: Physical Therapy

## 2016-06-25 ENCOUNTER — Ambulatory Visit: Payer: 59 | Attending: Physical Medicine & Rehabilitation | Admitting: Occupational Therapy

## 2016-06-25 DIAGNOSIS — I69352 Hemiplegia and hemiparesis following cerebral infarction affecting left dominant side: Secondary | ICD-10-CM

## 2016-06-25 DIAGNOSIS — R2681 Unsteadiness on feet: Secondary | ICD-10-CM | POA: Diagnosis present

## 2016-06-25 DIAGNOSIS — R278 Other lack of coordination: Secondary | ICD-10-CM | POA: Diagnosis present

## 2016-06-25 NOTE — Therapy (Signed)
[facility] [address] [place], [place], [address] Phone: [phone]   Fax:  [phone]  Occupational Therapy Treatment  Patient Details  Name: Elizabeth [name] MRN: [id number] Date of Birth: 1951/09/14 Referring Provider: Dr. Alysia Penna  Encounter Date: 06/25/2016      OT End of Session - 06/25/16 1024    Visit Number 7   Number of Visits 17   Date for OT Re-Evaluation 07/27/16   Authorization Type UHC, Medicare (g-code needed)   Authorization - Visit Number 7   Authorization - Number of Visits 10   OT Start Time 1020   OT Stop Time 1100   OT Time Calculation (min) 40 min   Activity Tolerance Patient tolerated treatment well   Behavior During Therapy [facility] for tasks assessed/performed      Past Medical History:  Diagnosis Date  . Abrasion or friction burn of foot and toe(s), with infection   . Allergy    soy  . Anxiety   . 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 Degenerative joint disease    Generalized  . Hyperlipidemia   . Hypertension   . Myocardial infarction   . Sickle cell trait ([place])   . Tobacco abuse April, 2011   Continued    Past Surgical History:  Procedure Laterality Date  . ANGIOPLASTY  2006   x3 stents  . DILATION AND CURETTAGE OF UTERUS    . EP IMPLANTABLE DEVICE N/A 05/21/2016   Procedure: Loop Recorder Insertion;  Surgeon: Thompson Grayer, MD;  Location: [facility] CV LAB;  Service: Cardiovascular;  Laterality: N/A;  . FOOT SURGERY  2000  . LITHOTRIPSY  2008   left kidney stone and also ureteral stent placed  . TEE WITHOUT CARDIOVERSION N/A 05/21/2016   Procedure: TRANSESOPHAGEAL ECHOCARDIOGRAM (TEE);  Surgeon: Lelon Perla, MD;  Location: [facility] ENDOSCOPY;  Service: Cardiovascular;  Laterality: N/A;    There were no vitals filed for this visit.      Subjective Assessment -  06/25/16 1022    Subjective  Pt reports that the pink putty is getting easier.  "My writing is better"   Pertinent History hx of falls, hx of MI x3, hx of chronic R shoulder pain, degenerative joint disease, HTN, loop recorder placement   Patient Stated Goals "to get back to normal"   Currently in Pain? No/denies                      OT Treatments/Exercises (OP) - 06/25/16 0001      Hand Exercises   Other Hand Exercises Gripper set at level 2 resistance ([place]) to pick up blocks Lt hand for sustained grip strength. Pt with mod difficulty and drops. Pt required 2 short rest breaks.     Fine Motor Coordination   Fine Motor Coordination O'Connor pegs;Purdue Pegboard   O'Connor pegs Placing pegs in pegboard with tweezers for incr coordination with mod difficulty.     Purdue Pegboard with minimal incr time for improvd coordination then with in-hand manipulation of pieces/translation palm>fingertips with mod difficulty/drops (and removing with in-hand manipulation)     Visual/Perceptual Exercises   Scanning Environmental   Scanning - Environmental Pt scanning for items in environment while tossing ball between hand.  Pt found 8/10 items on first attempt and all items on 2nd attempt.  OT Education - 06/25/16 1047    Education Details Upgraded putty HEP to green   Person(s) Educated Patient   Methods Explanation;Demonstration;Verbal cues   Comprehension Verbalized understanding;Returned demonstration          OT Short Term Goals - 06/20/16 0941      OT SHORT TERM GOAL #1   Title Pt will be independent with inital HEP for LUE strength and coordination.--check STGs    Time 4   Period Weeks   Status Achieved     OT SHORT TERM GOAL #2   Title Pt will improve coordination for ADLs as shown by improving time on 9-hole peg test by at least 5sec.   Baseline 26.12sec   Time 4   Period Weeks   Status On-going  06/20/16: 24.97 sec     OT SHORT  TERM GOAL #3   Title Pt will improve L grip strength by at least 6lbs for ADLs and lifting tasks.   Baseline R-52lbs, L-35lbs   Time 4   Period Weeks   Status Achieved  06/20/16: 43 lbs     OT SHORT TERM GOAL #4   Title Pt will be able to carry cup with water at least 200 feet without spills.   Time 4   Period Weeks   Status Achieved           OT Long Term Goals - 06/20/16 1610      OT LONG TERM GOAL #1   Title Pt will be independent with updated HEP--check LTGs 07/27/16   Time 8   Period Weeks   Status Achieved     OT LONG TERM GOAL #2   Title Pt will be able to retrieve 3lb object from overhead safely with dominant LUE.   Time 8   Period Weeks   Status Achieved     OT LONG TERM GOAL #3   Title Pt will improve L grip strength by at least 12lbs for ADLs and lifting tasks.   Baseline R-52lbs, L-35lbs   Time 8   Period Weeks   Status On-going     OT LONG TERM GOAL #4   Title Pt will be able to complete mod complex cooking and home maintenance tasks mod I.   Time 8   Period Weeks   Status Achieved  met per pt report. Pt has returned to all cooking/cleaning               Plan - 06/25/16 1025    Clinical Impression Statement Pt progressing well with incr ease with dominant LUE functional use at home.     OT Frequency 2x / week   OT Duration 8 weeks   OT Treatment/Interventions Self-care/ADL training;Therapeutic exercise;DME and/or AE instruction;Parrafin;Cryotherapy;Therapist, nutritional;Therapeutic activities;Patient/family education;Balance training;Cognitive remediation/compensation;Splinting;Manual Therapy;Neuromuscular education;Fluidtherapy;Ultrasound;Moist Heat;Energy conservation;Passive range of motion;Therapeutic exercises;Visual/perceptual remediation/compensation   Plan continue with environmental scanning with physical task, update coordination HEP prn, check remaining goals with ? 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 Left hemiparesis ([place]) 06/16/2016  . Dysfunction of right rotator cuff 06/13/2016  . 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 Facial droop   . Adjustment disorder with anxious mood   . Acute CVA (cerebrovascular accident) ([place]) 05/20/2016  . Slurred speech   . Dysarthria, post-stroke   . Sickle cell trait ([place])   . Acute embolic stroke ([place]) 19/94/1290  . Depression   . Cervical radiculitis 05/17/2013  . Right shoulder pain 11/16/2012  . Anxiety 04/02/2011  . Encounter for well adult exam with abnormal findings 03/31/2011  . CAD (coronary artery disease)   . Benign essential HTN   . Hyperlipidemia   . Degenerative joint disease   . Tobacco abuse 12/22/2009    [facility] 06/25/2016, 10:57 AM  [facility] [address] [place], [place], [address] Phone: [phone]   Fax:  [phone]  Name: Coriann Brouhard MRN: [id number] Date of Birth: 05/06/1951   Vianne Bulls, OTR/L [facility] [address] [place], [place]  [address] [phone] phone [phone] 06/25/16 10:57 AM

## 2016-06-27 ENCOUNTER — Ambulatory Visit: Payer: Medicare Other | Admitting: Physical Therapy

## 2016-06-27 ENCOUNTER — Ambulatory Visit: Payer: 59 | Admitting: Occupational Therapy

## 2016-06-27 DIAGNOSIS — I69352 Hemiplegia and hemiparesis following cerebral infarction affecting left dominant side: Secondary | ICD-10-CM

## 2016-06-27 DIAGNOSIS — R278 Other lack of coordination: Secondary | ICD-10-CM

## 2016-06-27 NOTE — Therapy (Signed)
[facility] [address] [place], [place], [address] Phone: [phone]   Fax:  [phone]  Occupational Therapy Treatment  Patient Details  Name: Elizabeth [name] MRN: [id number] Date of Birth: Nov 26, 1950 Referring Provider: Dr. Alysia Penna  Encounter Date: 06/27/2016      OT End of Session - 06/27/16 1145    Visit Number 8   Number of Visits 17   Date for OT Re-Evaluation 07/27/16   Authorization Type UHC, Medicare (g-code needed)   Authorization - Visit Number 8   Authorization - Number of Visits 10   OT Start Time 1115   OT Stop Time 1140   OT Time Calculation (min) 25 min   Activity Tolerance Patient tolerated treatment well      Past Medical History:  Diagnosis Date  . Abrasion or friction burn of foot and toe(s), with infection   . Allergy    soy  . Anxiety   . 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 Degenerative joint disease    Generalized  . Hyperlipidemia   . Hypertension   . Myocardial infarction   . Sickle cell trait ([place])   . Tobacco abuse April, 2011   Continued    Past Surgical History:  Procedure Laterality Date  . ANGIOPLASTY  2006   x3 stents  . DILATION AND CURETTAGE OF UTERUS    . EP IMPLANTABLE DEVICE N/A 05/21/2016   Procedure: Loop Recorder Insertion;  Surgeon: Thompson Grayer, MD;  Location: [place] [facility] CV LAB;  Service: Cardiovascular;  Laterality: N/A;  . FOOT SURGERY  2000  . LITHOTRIPSY  2008   left kidney stone and also ureteral stent placed  . TEE WITHOUT CARDIOVERSION N/A 05/21/2016   Procedure: TRANSESOPHAGEAL ECHOCARDIOGRAM (TEE);  Surgeon: Lelon Perla, MD;  Location: [facility] ENDOSCOPY;  Service: Cardiovascular;  Laterality: N/A;    There were no vitals filed for this visit.      Subjective Assessment - 06/27/16 1116    Subjective  I feel like I'm doing well, back to  doing everything I could do before.    Pertinent History hx of falls, hx of MI x3, hx of chronic R shoulder pain, degenerative joint disease, HTN, loop recorder placement   Patient Stated Goals "to get back to normal"   Currently in Pain? No/denies            [facility] OT Assessment - 06/27/16 0001      Coordination   Left 9 Hole Peg Test 21.38 sec     Hand Function   Left Hand Grip (lbs) 55                  OT Treatments/Exercises (OP) - 06/27/16 0001      ADLs   ADL Comments Assessed remaining STG and LTG with improvements - see assessment and goal section. Reviewed recommendations for driving which include MD clearance (pt sees Dr. Letta Pate on 07/11/16), avoiding high traffic times/areas, avoid interstate driving, no night time driving initially, no distractions (eating, talking on phone, etc) and initially have supervision      Fine Motor Coordination   Small Pegboard Pt placing needle sized pegs in O'Connor pegboard Lt hand with min difficulty only     Visual/Perceptual Exercises   Scanning - Environmental Environmental scanning in busy gym while tossing ball Rt hand (for multi-tasking) and pt found 12/13 itmes  on first attempt (missing 1 on Rt with similar background) = 92% accuracy                  OT Short Term Goals - 07-Jul-2016 1145      OT SHORT TERM GOAL #1   Title Pt will be independent with inital HEP for LUE strength and coordination.--check STGs    Time 4   Period Weeks   Status Achieved     OT SHORT TERM GOAL #2   Title Pt will improve coordination for ADLs as shown by improving time on 9-hole peg test by at least 5sec.   Baseline 26.12sec   Time 4   Period Weeks   Status Achieved  06/20/16: 24.97 sec, 07-07-16: 21.38 sec     OT SHORT TERM GOAL #3   Title Pt will improve L grip strength by at least 6lbs for ADLs and lifting tasks.   Baseline R-52lbs, L-35lbs   Time 4   Period Weeks   Status Achieved  06/20/16: 43 lbs     OT SHORT  TERM GOAL #4   Title Pt will be able to carry cup with water at least 200 feet without spills.   Time 4   Period Weeks   Status Achieved           OT Long Term Goals - 07/07/2016 1146      OT LONG TERM GOAL #1   Title Pt will be independent with updated HEP--check LTGs 07/27/16   Time 8   Period Weeks   Status Achieved     OT LONG TERM GOAL #2   Title Pt will be able to retrieve 3lb object from overhead safely with dominant LUE.   Time 8   Period Weeks   Status Achieved     OT LONG TERM GOAL #3   Title Pt will improve L grip strength by at least 12lbs for ADLs and lifting tasks.   Baseline R-52lbs, L-35lbs   Time 8   Period Weeks   Status Achieved  2016-07-07: 55 lbs     OT LONG TERM GOAL #4   Title Pt will be able to complete mod complex cooking and home maintenance tasks mod I.   Time 8   Period Weeks   Status Achieved  met per pt report. Pt has returned to all cooking/cleaning               Plan - July 07, 2016 1146    Clinical Impression Statement Pt has met all STG's and LTG's. Pt reports returning to all previous functional tasks and home management except driving. Discussion with patient about recommendations with driving including MD clearance prior to driving again.    Plan D/C O.T.    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 Left hemiparesis ([place]) 06/16/2016  . Dysfunction of right rotator cuff 06/13/2016  . 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 Facial droop   . Adjustment disorder with anxious mood   . Acute CVA (cerebrovascular accident) ([place]) 05/20/2016  . Slurred speech   . Dysarthria, post-stroke   . Sickle cell trait ([place])   . Acute embolic stroke ([place]) 30/13/1438  . Depression   . Cervical radiculitis 05/17/2013  . Right shoulder pain 11/16/2012  . Anxiety 04/02/2011  . Encounter for well adult exam with abnormal findings 03/31/2011  . CAD (coronary artery disease)   . Benign essential HTN   . Hyperlipidemia   . Degenerative joint disease   . Tobacco abuse 12/22/2009     OCCUPATIONAL THERAPY DISCHARGE SUMMARY  Visits from Start of Care: 8  Current functional level related to goals / functional outcomes: SEE ABOVE - Pt met all goals   Remaining deficits: Mild coordination deficits Lt hand   Education / Equipment: HEP's, safety/recommendations re: driving  Plan: Patient agrees to discharge.  Patient goals were met. Patient is being discharged due to meeting the stated rehab goals.  ?????        Carey Bullocks, OTR/L 06/27/2016, 11:50 AM  [facility] [address] [place], [place], [address] Phone: [phone]   Fax:  [phone]  Name: Elizabeth [name] MRN: [id number] Date of Birth: July 29, 1951

## 2016-07-02 ENCOUNTER — Ambulatory Visit: Payer: Medicare Other | Admitting: Physical Therapy

## 2016-07-04 ENCOUNTER — Ambulatory Visit: Payer: Medicare Other | Admitting: Physical Therapy

## 2016-07-11 ENCOUNTER — Encounter: Payer: 59 | Attending: Physical Medicine & Rehabilitation

## 2016-07-11 ENCOUNTER — Encounter: Payer: Self-pay | Admitting: Physical Medicine & Rehabilitation

## 2016-07-11 ENCOUNTER — Ambulatory Visit (HOSPITAL_BASED_OUTPATIENT_CLINIC_OR_DEPARTMENT_OTHER): Payer: 59 | Admitting: Physical Medicine & Rehabilitation

## 2016-07-11 VITALS — BP 144/85 | HR 59 | Resp 14

## 2016-07-11 DIAGNOSIS — G8929 Other chronic pain: Secondary | ICD-10-CM | POA: Diagnosis not present

## 2016-07-11 DIAGNOSIS — I6350 Cerebral infarction due to unspecified occlusion or stenosis of unspecified cerebral artery: Secondary | ICD-10-CM

## 2016-07-11 DIAGNOSIS — F17210 Nicotine dependence, cigarettes, uncomplicated: Secondary | ICD-10-CM | POA: Diagnosis not present

## 2016-07-11 DIAGNOSIS — M25511 Pain in right shoulder: Secondary | ICD-10-CM | POA: Diagnosis not present

## 2016-07-11 DIAGNOSIS — M67911 Unspecified disorder of synovium and tendon, right shoulder: Secondary | ICD-10-CM

## 2016-07-11 DIAGNOSIS — I69354 Hemiplegia and hemiparesis following cerebral infarction affecting left non-dominant side: Secondary | ICD-10-CM | POA: Insufficient documentation

## 2016-07-11 DIAGNOSIS — I639 Cerebral infarction, unspecified: Secondary | ICD-10-CM

## 2016-07-11 NOTE — Patient Instructions (Addendum)
May repeat shoulder injection in a couple months. If your shoulder becomes more painful. Would recommend heat as well as some muscle creams   Non medication pain relief  Try ice 59min alternating with heating pad for 20 minutes every 2 hours as needed  Try TENs unit that can be puchased in a pharmacy for about 40.00, brands include Aleve or Icy Hot  Try various muscle cremes  Aspercreme or others that contain trolamine- this is anti inflammatory  Capsaicin creme which reduces Pain substance P  Lidocaine containing creme which has a numbing medicine  Methol and camphor containing creme which has a cooling effect   Graduated return to driving instructions were provided. It is recommended that the patient first drives with another licensed driver in an empty parking lot. If the patient does well with this, and they can drive on a quiet street with the licensed driver. If the patient does well with this they can drive on a busy street with a licensed driver. If the patient does well with this, the next time out they can go by himself. For the first month after resuming driving, I recommend no nighttime or Interstate driving.

## 2016-07-11 NOTE — Progress Notes (Signed)
Subjective:    Patient ID: Elizabeth [name], female    DOB: [birth year], 65 y.o.   MRN: [id number]  HPI  65 year old female with right CVA causing left hemiparesis. She's had a good functional recovery. She has a history of right shoulder pain, which worsened after stroke. She has completed her outpatient therapy. Her right shoulder is doing overall better. She does not require any assistance for self-care or ambulation Pain Inventory Average Pain 2 Pain Right Now 2 My pain is NA  In the last 24 hours, has pain interfered with the following? General activity 2 Relation with others 0 Enjoyment of life 0 What TIME of day is your pain at its worst? daytime Sleep (in general) Good  Pain is worse with: some activites Pain improves with: medication Relief from Meds: 3  Mobility how many minutes can you walk? 30  Function retired  Neuro/Psych No problems in this area  Prior Studies Any changes since last visit?  no  Physicians involved in your care Any changes since last visit?  no   Family History  Problem Relation Age of Onset  . Coronary artery disease Mother   . Coronary artery disease Father    Social History   Social History  . Marital status: Married    Spouse name: N/A  . Number of children: N/A  . Years of education: N/A   Social History Main Topics  . Smoking status: Former Smoker    Types: Cigarettes  . Smokeless tobacco: Never Used     Comment: Ongoing since 04/2005  . Alcohol use No  . Drug use: No  . Sexual activity: Not Asked   Other Topics Concern  . None   Social History Narrative  . None   Past Surgical History:  Procedure Laterality Date  . ANGIOPLASTY  2006   x3 stents  . DILATION AND CURETTAGE OF UTERUS    . EP IMPLANTABLE DEVICE N/A 05/21/2016   Procedure: Loop Recorder Insertion;  Surgeon: Thompson Grayer, MD;  Location: [place] [facility];  Service: Cardiovascular;  Laterality: N/A;  . FOOT SURGERY  2000  . LITHOTRIPSY  2008   left kidney stone and also ureteral stent placed  . TEE WITHOUT CARDIOVERSION N/A 05/21/2016   Procedure: TRANSESOPHAGEAL ECHOCARDIOGRAM (TEE);  Surgeon: Lelon Perla, MD;  Location: [facility];  Service: Cardiovascular;  Laterality: N/A;   Past Medical History:  Diagnosis Date  . Abrasion or friction burn of foot and toe(s), with infection   . Allergy    soy  . Anxiety   . 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 Degenerative joint disease    Generalized  . Hyperlipidemia   . Hypertension   . Myocardial infarction   . Sickle cell trait ([place])   . Tobacco abuse April, 2011   Continued   BP (!) 144/85   Pulse (!) 59   Resp 14   SpO2 97%   Opioid Risk Score:   Fall Risk Score:  `1  Depression screen PHQ 2/9  Depression screen [facility] 2/9 06/13/2016 05/29/2016 05/17/2013  Decreased Interest 0 0 0  Down, Depressed, Hopeless 0 0 0  PHQ - 2 Score 0 0 0  Altered sleeping 0 - -  Tired, decreased energy 1 - -  Change in appetite 0 - -  Feeling bad or failure about yourself  0 - -  Trouble concentrating  0 - -  Moving slowly or fidgety/restless 0 - -  Suicidal thoughts 0 - -  PHQ-9 Score 1 - -  Difficult doing work/chores Not difficult at all - -     Review of Systems  All other systems reviewed and are negative.      Objective:   Physical Exam  Constitutional: She is oriented to person, place, and time. She appears well-developed and well-nourished.  HENT:  Head: Normocephalic and atraumatic.  Eyes: Conjunctivae and EOM are normal. Pupils are equal, round, and reactive to light.  Neck: Normal range of motion.  Neurological: She is alert and oriented to person, place, and time.  Psychiatric: She has a normal mood and affect.  Nursing note and vitals reviewed.  4 plus/5 in the left deltoid, biceps, triceps, grip 5/5 bilateral hip flexors, knee extension, ankle dorsiflexion 5/5  in the right deltoid, bicep, tricep, grip  Positive impingement with Hawkins test right shoulder. She has full range of motion without pain. However, when she does active range of motion  Visual fields are intact. Conversation testing    Assessment & Plan:  1. Right subacromial impingement. Overall, improved after shoulder injection last month. No need for reinjection. At the current time. She will call if she has recurrence of pain.  2. Left hemiparesis, much improved. Overall modified independent level. May return to driving as below Graduated return to driving instructions were provided. It is recommended that the patient first drives with another licensed driver in an empty parking lot. If the patient does well with this, and they can drive on a quiet street with the licensed driver. If the patient does well with this they can drive on a busy street with a licensed driver. If the patient does well with this, the next time out they can go by himself. For the first month after resuming driving, I recommend no nighttime or Interstate driving.

## 2016-07-22 ENCOUNTER — Ambulatory Visit (INDEPENDENT_AMBULATORY_CARE_PROVIDER_SITE_OTHER): Payer: 59 | Admitting: *Deleted

## 2016-07-22 DIAGNOSIS — I639 Cerebral infarction, unspecified: Secondary | ICD-10-CM | POA: Diagnosis not present

## 2016-07-22 NOTE — Progress Notes (Signed)
[facility] Summary Report / Loop Recorder 

## 2016-07-26 ENCOUNTER — Encounter: Payer: Self-pay | Admitting: Neurology

## 2016-07-26 ENCOUNTER — Ambulatory Visit (INDEPENDENT_AMBULATORY_CARE_PROVIDER_SITE_OTHER): Payer: 59 | Admitting: Neurology

## 2016-07-26 VITALS — BP 142/84 | HR 59 | Ht 63.0 in | Wt 136.0 lb

## 2016-07-26 DIAGNOSIS — I639 Cerebral infarction, unspecified: Secondary | ICD-10-CM | POA: Diagnosis not present

## 2016-07-26 DIAGNOSIS — I2510 Atherosclerotic heart disease of native coronary artery without angina pectoris: Secondary | ICD-10-CM

## 2016-07-26 DIAGNOSIS — I10 Essential (primary) hypertension: Secondary | ICD-10-CM | POA: Diagnosis not present

## 2016-07-26 DIAGNOSIS — E782 Mixed hyperlipidemia: Secondary | ICD-10-CM

## 2016-07-26 DIAGNOSIS — Z720 Tobacco use: Secondary | ICD-10-CM

## 2016-07-26 NOTE — Progress Notes (Signed)
STROKE NEUROLOGY FOLLOW UP NOTE  NAME: Elizabeth [name] DOB: 06/19/1951  REASON FOR VISIT: stroke follow up HISTORY FROM: pt and chart  Today we had the pleasure of seeing Elizabeth [name] in follow-up at our Neurology Clinic. Pt was accompanied by husband.   History Summary Ms. Elizabeth [name] is a 65 y.o. Left handed female with history of CAD/MI 3 s/p 3 drug-eluting stents, hypertension, hyperlipidemia, sickle cell trait, and active smoking, was admitted on 05/19/16 for left facial droop and blurring of vision in left eye. MRI showed right anterior paramedian pontine, right PLIC, right CR, and left parietal operculum infarcts, cardioembolic pattern. MRA head, CTA neck, TTE, TEE, LE venous doppler, TCD MES all negative. Loop recorder placed. LDL 165 and A1C 5.5. She was started on ASA and continued lipitor and zetia on discharge to CIR.   Interval History During the interval time, the patient has been doing well. She stayed in CIR for a week and discharged home. She follows with Dr. Letta Pate in clinic. On loop recorder monitoring but has not get any report yet from monitoring. Left hand weakness much improved, able to write. She is on ASA and lipitor. No other complains. Introduced Haematologist trial to her. BP 142/84  REVIEW OF SYSTEMS: Full 14 system review of systems performed and notable only for those listed below and in HPI above, all others are negative:  Constitutional:   Cardiovascular:  Ear/Nose/Throat:   Skin:  Eyes:   Respiratory:   Gastroitestinal:   Genitourinary:  Hematology/Lymphatic:  Bruise easily Endocrine:  Musculoskeletal:  Joint pain, neck pain, neck stiffness Allergy/Immunology:  Food allergy Neurological:   Psychiatric:  Sleep:   The following represents the patient's updated allergies and side effects list: Allergies  Allergen Reactions  . Shrimp [Shellfish Allergy] Nausea And Vomiting  . Adhesive [Tape] Other (See Comments)    Unknown  . Soy Allergy  Nausea And Vomiting    The neurologically relevant items on the patient's problem list were reviewed on today's visit.  Neurologic Examination  A problem focused neurological exam (12 or more points of the single system neurologic examination, vital signs counts as 1 point, cranial nerves count for 8 points) was performed.  There were no vitals taken for this visit.  General - Well nourished, well developed, in no apparent distress.  Ophthalmologic - Sharp disc margins OU.   Cardiovascular - Regular rate and rhythm with no murmur.  Mental Status -  Level of arousal and orientation to time, place, and person were intact. Language including expression, naming, repetition, comprehension was assessed and found intact. Fund of Knowledge was assessed and was intact.  Cranial Nerves II - XII - II - Visual field intact OU. III, IV, VI - Extraocular movements intact. V - Facial sensation intact bilaterally. VII - Facial movement intact bilaterally. VIII - Hearing & vestibular intact bilaterally. X - Palate elevates symmetrically. XI - Chin turning & shoulder shrug intact bilaterally. XII - Tongue protrusion intact.  Motor Strength - The patient's strength was normal in all extremities and pronator drift was absent.  Bulk was normal and fasciculations were absent.   Motor Tone - Muscle tone was assessed at the neck and appendages and was normal.  Reflexes - The patient's reflexes were 1+ in all extremities and she had no pathological reflexes.  Sensory - Light touch, temperature/pinprick were assessed and were normal.    Coordination - The patient had normal movements in the hands and feet with no ataxia or dysmetria.  Tremor was absent.  Gait and Station - The patient's transfers, posture, gait, station, and turns were observed as normal.   Functional score  mRS = 1   0 - No symptoms.   1 - No significant disability. Able to carry out all usual activities, despite some  symptoms.   2 - Slight disability. Able to look after own affairs without assistance, but unable to carry out all previous activities.   3 - Moderate disability. Requires some help, but able to walk unassisted.   4 - Moderately severe disability. Unable to attend to own bodily needs without assistance, and unable to walk unassisted.   5 - Severe disability. Requires constant nursing care and attention, bedridden, incontinent.   6 - Dead.   NIH Stroke Scale = 0   Data reviewed: I personally reviewed the images and agree with the radiology interpretations.  Ct Head Wo Contrast 05/19/2016 Small area of hypoattenuation in the deep white matter of the right frontal lobe, which likely represents an age-indeterminate ischemic infarct. Alternatively this may represent an area of demyelination. Mild brain parenchymal atrophy.   Ct Angio Neck W Or Wo Contrast 05/20/2016 No carotid bifurcation stenosis or dissection. Minor atheromatous change at the great vessel origins of the innominate and LEFT common carotid artery, with calcific and noncalcific plaque resulting in 50% stenosis LEFT subclavian origin.  Mr Brain Wo Contrast Mr Jodene Nam Head/brain Wo Cm 05/19/2016 1. Several small acute/early subacute infarctions within the right anterior paramedian superior pons, right posterior limb of internal capsule, right mid corona radiata, and left parietal operculum.  2. Small late subacute to chronic infarcts within the left splenium of corpus callosum, left occipital lobe, and right inferior pons. 3. No acute intracranial hemorrhage. 4. Mild paranasal sinus disease. 5. No occlusion, aneurysm, dissection, or significant stenosis of the circle of Willis is identified.   Transcranial Doppler with Monitoring - no HITS.  Lower extremity venous duplex: No evidence of DVT or baker's cyst.  TTE - Left ventricle: The cavity size was normal. Wall thickness was normal. Systolic function was normal. The  estimated ejection fraction was in the range of 60% to 65%. Wall motion was normal; there were no regional wall motion abnormalities. Doppler parameters are consistent with abnormal left ventricular relaxation (grade 1 diastolic dysfunction).  TEE - Normal LV systolic function Negative saline microcavitation study  Component     Latest Ref Rng & Units 05/20/2016 05/29/2016  Cholesterol     0 - 200 mg/dL 248 (H)   Triglycerides     <150 mg/dL 145   HDL Cholesterol     >40 mg/dL 54   Total CHOL/HDL Ratio     RATIO 4.6   VLDL     0 - 40 mg/dL 29   LDL (calc)     0 - 99 mg/dL 165 (H)   Hemoglobin A1C     4.8 - 5.6 % 5.5   Mean Plasma Glucose     mg/dL 111   TSH     0.35 - 4.50 uIU/mL  1.89    Assessment: As you may recall, she is a 65 y.o. African American female with PMH of CAD/MI 3 s/p 3 drug-eluting stents, hypertension, hyperlipidemia, sickle cell trait, and active smoking, was admitted on 05/19/16 for left facial droop and blurring of vision in left eye. MRI showed right anterior paramedian pontine, right PLIC, right CR, and left parietal operculum infarcts, cardioembolic pattern. MRA head, CTA neck, TTE, TEE, LE venous doppler, TCD  MES all negative. Loop recorder placed. LDL 165 and A1C 5.5. She was started on ASA and continued lipitor and zetia. Left hand weakness much improved and near normal now. Still not quit smoking yet. Introduced Haematologist trial to her.   Plan:  - continue ASA and lipitor for now for stroke prevention - consider RESPECT ESUS trial - continue to follow up with loop recorder - quit smoking - Follow up with your primary care physician for stroke risk factor modification. Recommend maintain blood pressure goal around 130/80, diabetes with hemoglobin A1c goal below 7.0% and lipids with LDL cholesterol goal below 70 mg/dL.  - check BP at home and record - healthy diet and regular exercise - follow up in 4 months.  I spent more than 25 minutes  of face to face time with the patient. Greater than 50% of time was spent in counseling and coordination of care. We discussed loop recorder monitoring and RESPECT ESUS trial   No orders of the defined types were placed in this encounter.   Meds ordered this encounter  Medications  . amLODipine (NORVASC) 5 MG tablet    There are no Patient Instructions on file for this visit.  Rosalin Hawking, MD PhD [facility] [address], [place], [place] [address] [phone]

## 2016-07-26 NOTE — Patient Instructions (Addendum)
-   continue ASA and lipitor for now for stroke prevention - consider RESPECT ESUS trial - continue to follow up with loop recorder - quit smoking - Follow up with your primary care physician for stroke risk factor modification. Recommend maintain blood pressure goal around 130/80, diabetes with hemoglobin A1c goal below 7.0% and lipids with LDL cholesterol goal below 70 mg/dL.  - check BP at home and record - healthy diet and regular exercise - follow up in 4 months.

## 2016-07-29 ENCOUNTER — Other Ambulatory Visit: Payer: Self-pay | Admitting: Internal Medicine

## 2016-08-02 LAB — CUP PACEART REMOTE DEVICE CHECK
Implantable Pulse Generator Implant Date: 20170829
MDC IDC SESS DTM: 20170928203519

## 2016-08-02 NOTE — Progress Notes (Signed)
Carelink summary report received. Battery status OK. Normal device function. No new symptom episodes, tachy episodes, brady, or pause episodes. No new AF episodes. Monthly summary reports and ROV/PRN 

## 2016-08-19 ENCOUNTER — Ambulatory Visit (INDEPENDENT_AMBULATORY_CARE_PROVIDER_SITE_OTHER): Payer: 59 | Admitting: *Deleted

## 2016-08-19 DIAGNOSIS — I639 Cerebral infarction, unspecified: Secondary | ICD-10-CM

## 2016-08-20 NOTE — Progress Notes (Signed)
[facility] Summary Report / Loop Recorder 

## 2016-08-26 LAB — CUP PACEART REMOTE DEVICE CHECK
MDC IDC PG IMPLANT DT: 20170829
MDC IDC SESS DTM: 20171028210639

## 2016-08-26 NOTE — Progress Notes (Signed)
Carelink summary report received. Battery status OK. Normal device function. No new symptom episodes, tachy episodes, brady, or pause episodes. No new AF episodes. Monthly summary reports and ROV/PRN 

## 2016-08-27 ENCOUNTER — Other Ambulatory Visit: Payer: Self-pay | Admitting: Internal Medicine

## 2016-09-18 ENCOUNTER — Ambulatory Visit (INDEPENDENT_AMBULATORY_CARE_PROVIDER_SITE_OTHER): Payer: 59 | Admitting: *Deleted

## 2016-09-18 DIAGNOSIS — I639 Cerebral infarction, unspecified: Secondary | ICD-10-CM | POA: Diagnosis not present

## 2016-09-19 NOTE — Progress Notes (Signed)
[facility] Summary Report / Loop Recorder 

## 2016-09-28 LAB — CUP PACEART REMOTE DEVICE CHECK
Implantable Pulse Generator Implant Date: 20170829
MDC IDC SESS DTM: 20171127223526

## 2016-09-28 NOTE — Progress Notes (Signed)
Carelink summary report received. Battery status OK. Normal device function. No new symptom episodes, tachy episodes, brady, or pause episodes. No new AF episodes. Monthly summary reports and ROV/PRN 

## 2016-10-05 ENCOUNTER — Other Ambulatory Visit: Payer: Self-pay | Admitting: Internal Medicine

## 2016-10-11 ENCOUNTER — Encounter: Payer: 59 | Attending: Physical Medicine & Rehabilitation

## 2016-10-11 ENCOUNTER — Ambulatory Visit: Payer: 59 | Admitting: Physical Medicine & Rehabilitation

## 2016-10-11 DIAGNOSIS — I69354 Hemiplegia and hemiparesis following cerebral infarction affecting left non-dominant side: Secondary | ICD-10-CM | POA: Insufficient documentation

## 2016-10-11 DIAGNOSIS — M25511 Pain in right shoulder: Secondary | ICD-10-CM | POA: Insufficient documentation

## 2016-10-11 DIAGNOSIS — G8929 Other chronic pain: Secondary | ICD-10-CM | POA: Insufficient documentation

## 2016-10-11 DIAGNOSIS — F17210 Nicotine dependence, cigarettes, uncomplicated: Secondary | ICD-10-CM | POA: Insufficient documentation

## 2016-10-18 ENCOUNTER — Ambulatory Visit (INDEPENDENT_AMBULATORY_CARE_PROVIDER_SITE_OTHER): Payer: 59 | Admitting: *Deleted

## 2016-10-18 DIAGNOSIS — I639 Cerebral infarction, unspecified: Secondary | ICD-10-CM

## 2016-10-21 NOTE — Progress Notes (Signed)
[facility] Summary Report / Loop Recorder 

## 2016-10-31 LAB — CUP PACEART REMOTE DEVICE CHECK
MDC IDC PG IMPLANT DT: 20170829
MDC IDC SESS DTM: 20171227233524

## 2016-11-17 LAB — CUP PACEART REMOTE DEVICE CHECK
Date Time Interrogation Session: 20180127003531
MDC IDC PG IMPLANT DT: 20170829

## 2016-11-18 ENCOUNTER — Ambulatory Visit (INDEPENDENT_AMBULATORY_CARE_PROVIDER_SITE_OTHER): Payer: 59 | Admitting: *Deleted

## 2016-11-18 DIAGNOSIS — I639 Cerebral infarction, unspecified: Secondary | ICD-10-CM | POA: Diagnosis not present

## 2016-11-18 NOTE — Progress Notes (Signed)
[facility] Summary Report / Loop Recorder 

## 2016-11-26 ENCOUNTER — Ambulatory Visit (INDEPENDENT_AMBULATORY_CARE_PROVIDER_SITE_OTHER): Payer: 59 | Admitting: Neurology

## 2016-11-26 ENCOUNTER — Encounter: Payer: Self-pay | Admitting: Neurology

## 2016-11-26 VITALS — BP 130/80 | HR 55 | Wt 133.8 lb

## 2016-11-26 DIAGNOSIS — E782 Mixed hyperlipidemia: Secondary | ICD-10-CM | POA: Diagnosis not present

## 2016-11-26 DIAGNOSIS — I2510 Atherosclerotic heart disease of native coronary artery without angina pectoris: Secondary | ICD-10-CM

## 2016-11-26 DIAGNOSIS — Z720 Tobacco use: Secondary | ICD-10-CM

## 2016-11-26 DIAGNOSIS — I10 Essential (primary) hypertension: Secondary | ICD-10-CM

## 2016-11-26 DIAGNOSIS — I6340 Cerebral infarction due to embolism of unspecified cerebral artery: Secondary | ICD-10-CM

## 2016-11-26 NOTE — Progress Notes (Signed)
STROKE NEUROLOGY FOLLOW UP NOTE  NAME: Elizabeth [name] DOB: [birth year]  REASON FOR VISIT: stroke follow up HISTORY FROM: pt and chart  Today we had the pleasure of seeing Elizabeth [name] in follow-up at our Neurology Clinic. Pt was accompanied by husband.   History Summary Elizabeth [name] is a 66 y.o. Left handed female with history of CAD/MI 3 s/p 3 drug-eluting stents, hypertension, hyperlipidemia, sickle cell trait, and active smoking, was admitted on 05/19/16 for left facial droop and blurring of vision in left eye. MRI showed right anterior paramedian pontine, right PLIC, right CR, and left parietal operculum infarcts, cardioembolic pattern. MRA head, CTA neck, TTE, TEE, LE venous doppler, TCD MES all negative. Loop recorder placed. LDL 165 and A1C 5.5. She was started on ASA and continued lipitor and zetia on discharge to CIR.   07/26/16 follow up - the patient has been doing well. She stayed in CIR for a week and discharged home. She follows with Dr. Letta Pate in clinic. On loop recorder monitoring but has not get any report yet from monitoring. Left hand weakness much improved, able to write. She is on ASA and lipitor. No other complains. Introduced Haematologist trial to her. BP 142/84  Interval History During the interval time, pt has been doing well. No recurrent stroke symptoms. Not quit smoking yet. Loop recorder on afib so far. BP at home stable on 3 BP meds. Today BP 130/80. Has not see PCP for almost 6 months now and will need request follow up. No other complains.   REVIEW OF SYSTEMS: Full 14 system review of systems performed and notable only for those listed below and in HPI above, all others are negative:  Constitutional:   Cardiovascular:  Ear/Nose/Throat:   Skin:  Eyes:   Respiratory:   Gastroitestinal:   Genitourinary:  Hematology/Lymphatic:  Bruise easily Endocrine:  Musculoskeletal:  neck pain, neck stiffness Allergy/Immunology:  Food allergy Neurological:     Psychiatric:  Sleep: daytime sleepiness  The following represents the patient's updated allergies and side effects list: Allergies  Allergen Reactions  . Shrimp [Shellfish Allergy] Nausea And Vomiting  . Adhesive [Tape] Other (See Comments)    Unknown  . Soy Allergy Nausea And Vomiting    The neurologically relevant items on the patient's problem list were reviewed on today's visit.  Neurologic Examination  A problem focused neurological exam (12 or more points of the single system neurologic examination, vital signs counts as 1 point, cranial nerves count for 8 points) was performed.  Blood pressure 130/80, pulse (!) 55, weight 133 lb 12.8 oz (60.7 kg).  General - Well nourished, well developed, in no apparent distress.  Ophthalmologic - Sharp disc margins OU.   Cardiovascular - Regular rate and rhythm with no murmur.  Mental Status -  Level of arousal and orientation to time, place, and person were intact. Language including expression, naming, repetition, comprehension was assessed and found intact. Fund of Knowledge was assessed and was intact.  Cranial Nerves II - XII - II - Visual field intact OU. III, IV, VI - Extraocular movements intact. V - Facial sensation intact bilaterally. VII - Facial movement intact bilaterally. VIII - Hearing & vestibular intact bilaterally. X - Palate elevates symmetrically. XI - Chin turning & shoulder shrug intact bilaterally. XII - Tongue protrusion intact.  Motor Strength - The patient's strength was normal in all extremities and pronator drift was absent.  Bulk was normal and fasciculations were absent.   Motor Tone - Muscle  tone was assessed at the neck and appendages and was normal.  Reflexes - The patient's reflexes were 1+ in all extremities and she had no pathological reflexes.  Sensory - Light touch, temperature/pinprick were assessed and were normal.    Coordination - The patient had normal movements in the hands and  feet with no ataxia or dysmetria.  Tremor was absent.  Gait and Station - The patient's transfers, posture, gait, station, and turns were observed as normal.   Data reviewed: I personally reviewed the images and agree with the radiology interpretations.  Ct Head Wo Contrast 05/19/2016 Small area of hypoattenuation in the deep white matter of the right frontal lobe, which likely represents an age-indeterminate ischemic infarct. Alternatively this may represent an area of demyelination. Mild brain parenchymal atrophy.   Ct Angio Neck W Or Wo Contrast 05/20/2016 No carotid bifurcation stenosis or dissection. Minor atheromatous change at the great vessel origins of the innominate and LEFT common carotid artery, with calcific and noncalcific plaque resulting in 50% stenosis LEFT subclavian origin.  Mr Brain Wo Contrast Mr Jodene Nam Head/brain Wo Cm 05/19/2016 1. Several small acute/early subacute infarctions within the right anterior paramedian superior pons, right posterior limb of internal capsule, right mid corona radiata, and left parietal operculum.  2. Small late subacute to chronic infarcts within the left splenium of corpus callosum, left occipital lobe, and right inferior pons. 3. No acute intracranial hemorrhage. 4. Mild paranasal sinus disease. 5. No occlusion, aneurysm, dissection, or significant stenosis of the circle of Willis is identified.   Transcranial Doppler with Monitoring - no HITS.  Lower extremity venous duplex: No evidence of DVT or baker's cyst.  TTE - Left ventricle: The cavity size was normal. Wall thickness was normal. Systolic function was normal. The estimated ejection fraction was in the range of 60% to 65%. Wall motion was normal; there were no regional wall motion abnormalities. Doppler parameters are consistent with abnormal left ventricular relaxation (grade 1 diastolic dysfunction).  TEE - Normal LV systolic function Negative saline  microcavitation study  Component     Latest Ref Rng & Units 05/20/2016 05/29/2016  Cholesterol     0 - 200 mg/dL 248 (H)   Triglycerides     <150 mg/dL 145   HDL Cholesterol     >40 mg/dL 54   Total CHOL/HDL Ratio     RATIO 4.6   VLDL     0 - 40 mg/dL 29   LDL (calc)     0 - 99 mg/dL 165 (H)   Hemoglobin A1C     4.8 - 5.6 % 5.5   Mean Plasma Glucose     mg/dL 111   TSH     0.35 - 4.50 uIU/mL  1.89    Assessment: As you may recall, she is a 66 y.o. African American female with PMH of CAD/MI 3 s/p 3 drug-eluting stents, hypertension, hyperlipidemia, sickle cell trait, and active smoking, was admitted on 05/19/16 for left facial droop and blurring of vision in left eye. MRI showed right anterior paramedian pontine, right PLIC, right CR, and left parietal operculum infarcts, cardioembolic pattern. MRA head, CTA neck, TTE, TEE, LE venous doppler, TCD MES all negative. Loop recorder placed. LDL 165 and A1C 5.5. She was started on ASA and continued lipitor and zetia. Left hand weakness much improved and near normal now. Still not quit smoking yet. Loop recorder on afib so far.    Plan:  - continue ASA and lipitor for now for  stroke prevention - continue to follow up with loop recorder - quit smoking - Follow up with your primary care physician for stroke risk factor modification. Recommend maintain blood pressure goal around 130/80, diabetes with hemoglobin A1c goal below 7.0% and lipids with LDL cholesterol goal below 70 mg/dL.  - check BP at home and record - healthy diet and regular exercise - follow up in 6 months with carolyn or Megan and then discharge if stable.   No orders of the defined types were placed in this encounter.   No orders of the defined types were placed in this encounter.   Patient Instructions  - continue ASA and lipitor for now for stroke prevention - continue to follow up with loop recorder - quit smoking - Follow up with your primary care physician for  stroke risk factor modification. Recommend maintain blood pressure goal around 130/80, diabetes with hemoglobin A1c goal below 7.0% and lipids with LDL cholesterol goal below 70 mg/dL.  - check BP at home and record - healthy diet and regular exercise - follow up in 6 months.   Rosalin Hawking, MD PhD [facility] [address] [place], [place] [address] [phone]

## 2016-11-26 NOTE — Patient Instructions (Signed)
-   continue ASA and lipitor for now for stroke prevention - continue to follow up with loop recorder - quit smoking - Follow up with your primary care physician for stroke risk factor modification. Recommend maintain blood pressure goal around 130/80, diabetes with hemoglobin A1c goal below 7.0% and lipids with LDL cholesterol goal below 70 mg/dL.  - check BP at home and record - healthy diet and regular exercise - follow up in 6 months.

## 2016-12-04 LAB — CUP PACEART REMOTE DEVICE CHECK
Date Time Interrogation Session: 20180226003630
MDC IDC PG IMPLANT DT: 20170829

## 2016-12-17 ENCOUNTER — Ambulatory Visit (INDEPENDENT_AMBULATORY_CARE_PROVIDER_SITE_OTHER): Payer: 59 | Admitting: *Deleted

## 2016-12-17 DIAGNOSIS — I639 Cerebral infarction, unspecified: Secondary | ICD-10-CM

## 2016-12-18 NOTE — Progress Notes (Signed)
[facility] Summary Report / Loop Recorder 

## 2017-01-02 LAB — CUP PACEART REMOTE DEVICE CHECK
MDC IDC PG IMPLANT DT: 20170829
MDC IDC SESS DTM: 20180328003720

## 2017-01-16 ENCOUNTER — Ambulatory Visit (INDEPENDENT_AMBULATORY_CARE_PROVIDER_SITE_OTHER): Payer: 59 | Admitting: *Deleted

## 2017-01-16 DIAGNOSIS — I639 Cerebral infarction, unspecified: Secondary | ICD-10-CM | POA: Diagnosis not present

## 2017-01-17 NOTE — Progress Notes (Signed)
[facility] Summary Report / Loop Recorder 

## 2017-02-05 LAB — CUP PACEART REMOTE DEVICE CHECK
Date Time Interrogation Session: 20180427013931
MDC IDC PG IMPLANT DT: 20170829

## 2017-02-18 ENCOUNTER — Ambulatory Visit (INDEPENDENT_AMBULATORY_CARE_PROVIDER_SITE_OTHER): Payer: 59 | Admitting: *Deleted

## 2017-02-18 DIAGNOSIS — I639 Cerebral infarction, unspecified: Secondary | ICD-10-CM

## 2017-02-18 NOTE — Progress Notes (Signed)
[facility] Summary Report / Loop Recorder 

## 2017-02-20 LAB — CUP PACEART REMOTE DEVICE CHECK
MDC IDC PG IMPLANT DT: 20170829
MDC IDC SESS DTM: 20180527020936

## 2017-02-24 ENCOUNTER — Other Ambulatory Visit: Payer: Self-pay | Admitting: Internal Medicine

## 2017-03-17 ENCOUNTER — Ambulatory Visit (INDEPENDENT_AMBULATORY_CARE_PROVIDER_SITE_OTHER): Payer: 59 | Admitting: *Deleted

## 2017-03-17 DIAGNOSIS — I639 Cerebral infarction, unspecified: Secondary | ICD-10-CM

## 2017-03-18 NOTE — Progress Notes (Signed)
[facility] Summary Report / Loop Recorder 

## 2017-03-23 LAB — CUP PACEART REMOTE DEVICE CHECK
Date Time Interrogation Session: 20180626021151
MDC IDC PG IMPLANT DT: 20170829

## 2017-04-16 ENCOUNTER — Ambulatory Visit (INDEPENDENT_AMBULATORY_CARE_PROVIDER_SITE_OTHER): Payer: 59 | Admitting: *Deleted

## 2017-04-16 DIAGNOSIS — I639 Cerebral infarction, unspecified: Secondary | ICD-10-CM

## 2017-04-17 NOTE — Progress Notes (Signed)
[facility] Summary Report / Loop Recorder 

## 2017-05-01 LAB — CUP PACEART REMOTE DEVICE CHECK
Date Time Interrogation Session: 20180726023905
MDC IDC PG IMPLANT DT: 20170829

## 2017-05-16 ENCOUNTER — Ambulatory Visit (INDEPENDENT_AMBULATORY_CARE_PROVIDER_SITE_OTHER): Payer: 59 | Admitting: *Deleted

## 2017-05-16 DIAGNOSIS — I639 Cerebral infarction, unspecified: Secondary | ICD-10-CM

## 2017-05-19 NOTE — Progress Notes (Signed)
[facility] Summary Report / Loop Recorder 

## 2017-05-22 ENCOUNTER — Encounter: Payer: Self-pay | Admitting: Internal Medicine

## 2017-05-22 ENCOUNTER — Ambulatory Visit (INDEPENDENT_AMBULATORY_CARE_PROVIDER_SITE_OTHER): Payer: 59 | Admitting: Internal Medicine

## 2017-05-22 ENCOUNTER — Other Ambulatory Visit (INDEPENDENT_AMBULATORY_CARE_PROVIDER_SITE_OTHER): Payer: Medicare Other

## 2017-05-22 VITALS — BP 144/82 | HR 64 | Temp 98.0°F | Ht 63.0 in | Wt 132.0 lb

## 2017-05-22 DIAGNOSIS — J309 Allergic rhinitis, unspecified: Secondary | ICD-10-CM | POA: Diagnosis not present

## 2017-05-22 DIAGNOSIS — Z0000 Encounter for general adult medical examination without abnormal findings: Secondary | ICD-10-CM | POA: Diagnosis not present

## 2017-05-22 DIAGNOSIS — E2839 Other primary ovarian failure: Secondary | ICD-10-CM | POA: Diagnosis not present

## 2017-05-22 DIAGNOSIS — J45901 Unspecified asthma with (acute) exacerbation: Secondary | ICD-10-CM | POA: Insufficient documentation

## 2017-05-22 DIAGNOSIS — Z23 Encounter for immunization: Secondary | ICD-10-CM

## 2017-05-22 DIAGNOSIS — I2510 Atherosclerotic heart disease of native coronary artery without angina pectoris: Secondary | ICD-10-CM | POA: Diagnosis not present

## 2017-05-22 DIAGNOSIS — J45909 Unspecified asthma, uncomplicated: Secondary | ICD-10-CM | POA: Diagnosis not present

## 2017-05-22 DIAGNOSIS — I10 Essential (primary) hypertension: Secondary | ICD-10-CM

## 2017-05-22 DIAGNOSIS — J4531 Mild persistent asthma with (acute) exacerbation: Secondary | ICD-10-CM | POA: Diagnosis not present

## 2017-05-22 LAB — CBC WITH DIFFERENTIAL/PLATELET
BASOS PCT: 0.9 % (ref 0.0–3.0)
Basophils Absolute: 0.1 10*3/uL (ref 0.0–0.1)
EOS PCT: 1.1 % (ref 0.0–5.0)
Eosinophils Absolute: 0.1 10*3/uL (ref 0.0–0.7)
HEMATOCRIT: 41.1 % (ref 36.0–46.0)
HEMOGLOBIN: 13.6 g/dL (ref 12.0–15.0)
LYMPHS PCT: 41.1 % (ref 12.0–46.0)
Lymphs Abs: 3 10*3/uL (ref 0.7–4.0)
MCHC: 33 g/dL (ref 30.0–36.0)
MCV: 91.3 fl (ref 78.0–100.0)
Monocytes Absolute: 0.7 10*3/uL (ref 0.1–1.0)
Monocytes Relative: 8.9 % (ref 3.0–12.0)
Neutro Abs: 3.5 10*3/uL (ref 1.4–7.7)
Neutrophils Relative %: 48 % (ref 43.0–77.0)
Platelets: 152 10*3/uL (ref 150.0–400.0)
RBC: 4.5 Mil/uL (ref 3.87–5.11)
RDW: 14.1 % (ref 11.5–15.5)
WBC: 7.3 10*3/uL (ref 4.0–10.5)

## 2017-05-22 LAB — URINALYSIS, ROUTINE W REFLEX MICROSCOPIC
Bilirubin Urine: NEGATIVE
Hgb urine dipstick: NEGATIVE
Ketones, ur: NEGATIVE
Leukocytes, UA: NEGATIVE
Nitrite: NEGATIVE
RBC / HPF: NONE SEEN (ref 0–?)
SPECIFIC GRAVITY, URINE: 1.01 (ref 1.000–1.030)
Total Protein, Urine: NEGATIVE
Urine Glucose: NEGATIVE
Urobilinogen, UA: 1 (ref 0.0–1.0)
WBC, UA: NONE SEEN (ref 0–?)
pH: 6.5 (ref 5.0–8.0)

## 2017-05-22 LAB — HEPATIC FUNCTION PANEL
ALT: 7 U/L (ref 0–35)
AST: 12 U/L (ref 0–37)
Albumin: 3.8 g/dL (ref 3.5–5.2)
Alkaline Phosphatase: 78 U/L (ref 39–117)
BILIRUBIN DIRECT: 0.1 mg/dL (ref 0.0–0.3)
Total Bilirubin: 0.3 mg/dL (ref 0.2–1.2)
Total Protein: 6.5 g/dL (ref 6.0–8.3)

## 2017-05-22 LAB — BASIC METABOLIC PANEL
BUN: 11 mg/dL (ref 6–23)
CALCIUM: 9.6 mg/dL (ref 8.4–10.5)
CO2: 30 mEq/L (ref 19–32)
Chloride: 106 mEq/L (ref 96–112)
Creatinine, Ser: 0.84 mg/dL (ref 0.40–1.20)
GFR: 87.17 mL/min (ref >60.00)
GLUCOSE: 102 mg/dL — AB (ref 70–99)
Potassium: 4.2 mEq/L (ref 3.5–5.1)
Sodium: 139 mEq/L (ref 135–145)

## 2017-05-22 LAB — CUP PACEART REMOTE DEVICE CHECK
Implantable Pulse Generator Implant Date: 20170829
MDC IDC SESS DTM: 20180825041256

## 2017-05-22 LAB — LIPID PANEL
CHOL/HDL RATIO: 3
Cholesterol: 160 mg/dL (ref 0–200)
HDL: 51.6 mg/dL (ref >39.00)
LDL CALC: 86 mg/dL (ref 0–99)
NONHDL: 108.55
Triglycerides: 112 mg/dL (ref 0.0–149.0)
VLDL: 22.4 mg/dL (ref 0.0–40.0)

## 2017-05-22 LAB — TSH: TSH: 1.4 u[IU]/mL (ref 0.35–4.50)

## 2017-05-22 MED ORDER — AMLODIPINE BESYLATE 2.5 MG PO TABS: 2.5000 mg | ORAL_TABLET | Freq: Every day | ORAL | 1 refills | Status: DC

## 2017-05-22 MED ORDER — ASPIRIN 325 MG PO TABS: 325.0000 mg | ORAL_TABLET | Freq: Every day | ORAL | 1 refills | Status: DC

## 2017-05-22 MED ORDER — EZETIMIBE 10 MG PO TABS: 10.0000 mg | ORAL_TABLET | Freq: Every day | ORAL | 1 refills | Status: DC

## 2017-05-22 MED ORDER — METHYLPREDNISOLONE ACETATE 80 MG/ML IJ SUSP
80.0000 mg | Freq: Once | INTRAMUSCULAR | Status: AC
  Administered 2017-05-22: 80 mg via INTRAMUSCULAR

## 2017-05-22 MED ORDER — ATORVASTATIN CALCIUM 80 MG PO TABS
80.0000 mg | ORAL_TABLET | Freq: Every day | ORAL | 1 refills | Status: DC
Start: 2017-05-22 — End: 2017-11-29

## 2017-05-22 MED ORDER — ESCITALOPRAM OXALATE 10 MG PO TABS: 10.0000 mg | ORAL_TABLET | Freq: Every day | ORAL | 0 refills | Status: DC

## 2017-05-22 MED ORDER — IRBESARTAN 300 MG PO TABS: 300.0000 mg | ORAL_TABLET | Freq: Every day | ORAL | 1 refills | Status: DC

## 2017-05-22 NOTE — Progress Notes (Signed)
Subjective:    Patient ID: Elizabeth [name], female    DOB: 12/30/1950, 66 y.o.   MRN: [id number]  HPI  Here for wellness and f/u;  Overall doing ok;  Pt denies Chest pain,  orthopnea, PND, worsening LE edema, palpitations, dizziness or syncope.  Pt denies neurological change such as new headache, facial or extremity weakness.  Pt denies polydipsia, polyuria, or low sugar symptoms. Pt states overall good compliance with treatment and medications, good tolerability, and has been trying to follow appropriate diet.  Pt denies worsening depressive symptoms, suicidal ideation or panic. No fever, night sweats, wt loss, loss of appetite, or other constitutional symptoms.  Pt states good ability with ADL's, has low fall risk, home safety reviewed and adequate, no other significant changes in hearing or vision, and only occasionally active with exercise. Incidentally, Pt also has mild 2 wks increased sob/doe, with mild wheezing, but no orthopnea, PND, increased LE swelling, palpitations, dizziness or syncope. Does have several wks ongoing nasal allergy symptoms with clearish congestion, itch and sneezing, without fever, pain, ST, cough, swelling or wheezing.  Also has Mild left sided lower abd pain yesterday ; has hx of renal stone with lithotrypsy but Denies urinary symptoms such as dysuria, frequency, urgency, flank pain, hematuria or n/v, fever, chills. Past Medical History:  Diagnosis Date  . Abrasion or friction burn of foot and toe(s), with infection   . Allergy    soy  . Anxiety   . 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 Degenerative joint disease    Generalized  . Hyperlipidemia   . Hypertension   . Myocardial infarction ([place])   . Sickle cell trait ([place])   . Stroke ([place])   . Tobacco abuse April, 2011   Continued   Past Surgical History:  Procedure Laterality Date  . ANGIOPLASTY  2006   x3 stents    . DILATION AND CURETTAGE OF UTERUS    . EP IMPLANTABLE DEVICE N/A 05/21/2016   Procedure: Loop Recorder Insertion;  Surgeon: Thompson Grayer, MD;  Location: [facility];  Service: Cardiovascular;  Laterality: N/A;  . FOOT SURGERY  2000  . LITHOTRIPSY  2008   left kidney stone and also ureteral stent placed  . TEE WITHOUT CARDIOVERSION N/A 05/21/2016   Procedure: TRANSESOPHAGEAL ECHOCARDIOGRAM (TEE);  Surgeon: Lelon Perla, MD;  Location: [facility];  Service: Cardiovascular;  Laterality: N/A;    reports that she has quit smoking. Her smoking use included Cigarettes. She has never used smokeless tobacco. She reports that she does not drink alcohol or use drugs. family history includes Coronary artery disease in her father and mother; Stroke in her mother. Allergies  Allergen Reactions  . Shrimp [Shellfish Allergy] Nausea And Vomiting  . Adhesive [Tape] Other (See Comments)    Unknown  . Soy Allergy Nausea And Vomiting   Current Outpatient Prescriptions on File Prior to Visit  Medication Sig Dispense Refill  . metoprolol tartrate (LOPRESSOR) 50 MG tablet Take 1 tablet (50 mg total) by mouth 2 (two) times daily. Annual appt due in sept must see MD for refills 180 tablet 0  . nitroGLYCERIN (NITROLINGUAL) 0.4 MG/SPRAY spray Place 1 spray under the tongue every 5 (five) minutes as needed. May repeat up to 3 doses. 12 g 0   No current facility-administered medications on file prior to visit.     Review of Systems  Constitutional: Negative for other unusual diaphoresis, sweats, appetite or weight changes HENT: Negative for other worsening hearing loss, ear pain, facial swelling, mouth sores or neck stiffness.   Eyes: Negative for other worsening pain, redness or other visual disturbance.  Respiratory: Negative for other stridor or swelling Cardiovascular: Negative for other palpitations or other chest pain  Gastrointestinal: Negative for worsening diarrhea or loose stools, blood in  stool, distention or other pain Genitourinary: Negative for hematuria, flank pain or other change in urine volume.  Musculoskeletal: Negative for myalgias or other joint swelling.  Skin: Negative for other color change, or other wound or worsening drainage.  Neurological: Negative for other syncope or numbness. Hematological: Negative for other adenopathy or swelling Psychiatric/Behavioral: Negative for hallucinations, other worsening agitation, SI, self-injury, or new decreased concentration All other system neg per pt    Objective:   Physical Exam BP (!) 144/82   Pulse 64   Temp 98 F (36.7 C) (Oral)   Ht 5\' 3"  (1.6 m)   Wt 132 lb (59.9 kg)   SpO2 98%   BMI 23.38 kg/m  VS noted,  Constitutional: Pt is oriented to person, place, and time. Appears well-developed and well-nourished, in no significant distress and comfortable Head: Normocephalic and atraumatic  Eyes: Conjunctivae and EOM are normal. Pupils are equal, round, and reactive to light Right Ear: External ear normal without discharge Left Ear: External ear normal without discharge Nose: Nose without discharge or deformity Mouth/Throat: Oropharynx is without other ulcerations and moist  Bilat tm's with mild erythema.  Max sinus areas tender.  Pharynx with mild erythema, no exudate  Neck: Normal range of motion. Neck supple. No JVD present. No tracheal deviation present or significant neck LA or mass Cardiovascular: Normal rate, regular rhythm, normal heart sounds and intact distal pulses.   Pulmonary/Chest: WOB normal and breath sounds decreased without rales but with bilat scattered wheezing  Abdominal: Soft. Bowel sounds are normal. NT. No HSM  Musculoskeletal: Normal range of motion. Exhibits no edema Lymphadenopathy: Has no other cervical adenopathy.  Neurological: Pt is alert and oriented to person, place, and time. Pt has normal reflexes. No cranial nerve deficit. Motor grossly intact, Gait intact Skin: Skin is warm  and dry. No rash noted or new ulcerations Psychiatric:  Has mild nervous mood and affect. Behavior is normal without agitation No other exam findings  ECG today I have personally interpreted Sinus Bradycardia  - 56    Assessment & Plan:

## 2017-05-22 NOTE — Patient Instructions (Addendum)
You had the flu shot today, and the steroid shot  We will sign you up for the Cologuard and you should hopefully hear soon.  Please return for a Nurse Visit appointment for 2 weeks for the Prevnar 13 pneumonia shot  Please schedule the bone density test before leaving today at the scheduling desk (where you check out)  You will be contacted regarding the referral for: mammogram  Your EKG was [facility] today  Please continue all other medications as before, and refills have been done if requested.  Please have the pharmacy call with any other refills you may need.  Please continue your efforts at being more active, low cholesterol diet, and weight control.  You are otherwise up to date with prevention measures today.  Please keep your appointments with your specialists as you may have planned  Please go to the LAB in the Basement (turn left off the elevator) for the tests to be done today  You will be contacted by phone if any changes need to be made immediately.  Otherwise, you will receive a letter about your results with an explanation, but please check with MyChart first.  Please remember to sign up for MyChart if you have not done so, as this will be important to you in the future with finding out test results, communicating by private email, and scheduling acute appointments online when needed.  Please return in 6 months, or sooner if needed

## 2017-05-25 DIAGNOSIS — J309 Allergic rhinitis, unspecified: Secondary | ICD-10-CM | POA: Insufficient documentation

## 2017-05-25 NOTE — Assessment & Plan Note (Signed)

## 2017-05-25 NOTE — Assessment & Plan Note (Addendum)
Mild to mod, for depomedrol IM 80, and zyrtec/.nasacort otc, to f/u any worsening symptoms or concerns

## 2017-05-25 NOTE — Assessment & Plan Note (Addendum)
Mild to mod, for depomedrol IM 80, declines inhaler,  to f/u any worsening symptoms or concerns  In addition to the time spent performing CPE, I spent an additional 15 minutes face to face,in which greater than 50% of this time was spent in counseling and coordination of care for patient's illness as documented. includiing the differential dx, tx, further evaluation and other management of asthma exacerbation, allergies and HTN

## 2017-05-25 NOTE — Assessment & Plan Note (Signed)
Mild elevated, likely reactive, o/w stable overall by history and exam, recent data reviewed with pt, and pt to continue medical treatment as before,  to f/u any worsening symptoms or concerns BP Readings from Last 3 Encounters:  05/22/17 (!) 144/82  11/26/16 130/80  07/26/16 (!) 142/84

## 2017-05-26 ENCOUNTER — Other Ambulatory Visit: Payer: Self-pay | Admitting: Internal Medicine

## 2017-05-28 NOTE — Progress Notes (Signed)
[facility]  PATIENT: Elizabeth [name] DOB: 1951-03-14   REASON FOR VISIT: Follow-up for stroke 05/19/2016 HISTORY FROM: Patient    HISTORY OF PRESENT ILLNESS:ElizabethElizabeth Everettis a 66 y.o.Left handed femalewith history of CAD/MI 3 s/p 3 drug-eluting stents, hypertension, hyperlipidemia, sickle cell trait, and active smoking, was admitted on 05/19/16 for left facial droop and blurring of vision in left eye. MRI showed right anterior paramedian pontine, right PLIC, right CR, and left parietal operculum infarcts, cardioembolic pattern. MRA head, CTA neck, TTE, TEE, LE venous doppler, TCD MES all negative. Loop recorder placed. LDL 165 and A1C 5.5. She was started on ASA and continued lipitor and zetia on discharge to CIR.   07/26/16 follow up - the patient has been doing well. She stayed in CIR for a week and discharged home. She follows with Dr. Letta Pate in clinic. On loop recorder monitoring but has not get any report yet from monitoring. Left hand weakness much improved, able to write. She is on ASA and lipitor. No other complains. Introduced Haematologist trial to her. BP 142/84  Interval History3/6/18 Dr Erlinda Hong During the interval time, pt has been doing well. No recurrent stroke symptoms. Not quit smoking yet. Loop recorder no afib so far. BP at home stable on 3 BP meds. Today BP 130/80. Has not see PCP for almost 6 months now and will need request follow up. No other complains.  UPDATE 09/06/2018[name], 66 year old female returns for follow-up with history of stroke in August 2017. She has been doing well without further stroke or TIA symptoms. She is currently on aspirin for secondary stroke prevention with minimal bruising and bleeding. Blood pressure in the office today 121/71. She remains on Lipitor without myalgias. Most recent lipid profile 05/22/2017 with total cholesterol 160, LDL 86. She continues to walk for exercise. She continues to smoke but is trying to  quit. Loop recorder without evidence of atrial fibrillation so far. She is driving without difficulty. She returns without neurologic complaints  REVIEW OF SYSTEMS: Full 14 system review of systems performed and notable only for those listed, all others are neg:  Constitutional: neg  Cardiovascular: neg Ear/Nose/Throat: neg  Skin: neg Eyes: neg Respiratory: neg Gastroitestinal: neg  Hematology/Lymphatic: neg  Endocrine: neg Musculoskeletal:neg Allergy/Immunology: neg Neurological: neg Psychiatric: neg Sleep : neg   ALLERGIES: Allergies  Allergen Reactions  . Shrimp [Shellfish Allergy] Nausea And Vomiting  . Adhesive [Tape] Other (See Comments)    Unknown  . Soy Allergy Nausea And Vomiting    HOME MEDICATIONS: Outpatient Medications Prior to Visit  Medication Sig Dispense Refill  . amLODipine (NORVASC) 2.5 MG tablet Take 1 tablet (2.5 mg total) by mouth daily. 90 tablet 1  . aspirin 325 MG tablet Take 1 tablet (325 mg total) by mouth daily. 90 tablet 1  . atorvastatin (LIPITOR) 80 MG tablet Take 1 tablet (80 mg total) by mouth daily at 6 PM. 90 tablet 1  . escitalopram (LEXAPRO) 10 MG tablet TAKE 1 TABLET BY MOUTH EVERY DAY 90 tablet 3  . ezetimibe (ZETIA) 10 MG tablet Take 1 tablet (10 mg total) by mouth daily. 90 tablet 1  . irbesartan (AVAPRO) 300 MG tablet Take 1 tablet (300 mg total) by mouth daily. 90 tablet 1  . metoprolol tartrate (LOPRESSOR) 50 MG tablet Take 1 tablet (50 mg total) by mouth 2 (two) times daily. 180 tablet 3  . nitroGLYCERIN (NITROLINGUAL) 0.4 MG/SPRAY spray Place 1 spray under the tongue every 5 (five) minutes as  needed. May repeat up to 3 doses. 12 g 0   No facility-administered medications prior to visit.     PAST MEDICAL HISTORY: Past Medical History:  Diagnosis Date  . Abrasion or friction burn of foot and toe(s), with infection   . Allergy    soy  . Anxiety   . CAD (coronary artery disease)    Inferior MI 2006; DES to RCA, staged BMS to  LAD; groin hematoma and lower abdominal hematoma then  . Degenerative joint disease    Generalized  . Hyperlipidemia   . Hypertension   . Myocardial infarction ([place])   . Sickle cell trait ([place])   . Stroke ([place])   . Tobacco abuse April, 2011   Continued    PAST SURGICAL HISTORY: Past Surgical History:  Procedure Laterality Date  . ANGIOPLASTY  2006   x3 stents  . DILATION AND CURETTAGE OF UTERUS    . EP IMPLANTABLE DEVICE N/A 05/21/2016   Procedure: Loop Recorder Insertion;  Surgeon: Thompson Grayer, MD;  Location: [facility];  Service: Cardiovascular;  Laterality: N/A;  . FOOT SURGERY  2000  . LITHOTRIPSY  2008   left kidney stone and also ureteral stent placed  . TEE WITHOUT CARDIOVERSION N/A 05/21/2016   Procedure: TRANSESOPHAGEAL ECHOCARDIOGRAM (TEE);  Surgeon: Lelon Perla, MD;  Location: [facility] ENDOSCOPY;  Service: Cardiovascular;  Laterality: N/A;    FAMILY HISTORY: Family History  Problem Relation Age of Onset  . Coronary artery disease Mother   . Stroke Mother   . Coronary artery disease Father   . Heart disease Brother     SOCIAL HISTORY: Social History   Social History  . Marital status: Married    Spouse name: N/A  . Number of children: N/A  . Years of education: N/A   Occupational History  . Not on file.   Social History Main Topics  . Smoking status: Former Smoker    Types: Cigarettes  . Smokeless tobacco: Never Used     Comment: Ongoing since 04/2005  . Alcohol use No  . Drug use: No  . Sexual activity: Not on file   Other Topics Concern  . Not on file   Social History Narrative  . No narrative on file     PHYSICAL EXAM  Vitals:   05/29/17 0849  BP: 121/74  Pulse: 70  Weight: 131 lb (59.4 kg)   Body mass index is 23.21 kg/m.  Generalized: Well developed, in no acute distress  Head: normocephalic and atraumatic,. Oropharynx benign  Neck: Supple, no carotid bruits  Cardiac: Regular rate rhythm, no murmur  Musculoskeletal:  No deformity   Neurological examination   Mentation: Alert oriented to time, place, history taking. Attention span and concentration appropriate. Recent and remote memory intact.  Follows all commands speech and language fluent.   Cranial nerve II-XII: Pupils were equal round reactive to light extraocular movements were full, visual field were full on confrontational test. Facial sensation and strength were normal. hearing was intact to finger rubbing bilaterally. Uvula tongue midline. head turning and shoulder shrug were normal and symmetric.Tongue protrusion into cheek strength was normal. Motor: normal bulk and tone, full strength in the BUE, BLE, fine finger movements normal, no pronator drift. No focal weakness Sensory: normal and symmetric to light touch, pinprick, and  Vibration, In the upper and lower extremities  Coordination: finger-nose-finger, heel-to-shin bilaterally, no dysmetria Reflexes: 1+ upper lower and symmetric, plantar responses were flexor bilaterally. Gait and Station: Rising up from  seated position without assistance, normal stance,  moderate stride, good arm swing, smooth turning, able to perform tiptoe, and heel walking without difficulty. Tandem gait is steady. No assistive device  DIAGNOSTIC DATA (LABS, IMAGING, TESTING) - I reviewed patient records, labs, notes, testing and imaging myself where available.  Lab Results  Component Value Date   WBC 7.3 05/22/2017   HGB 13.6 05/22/2017   HCT 41.1 05/22/2017   MCV 91.3 05/22/2017   PLT 152.0 05/22/2017      Component Value Date/Time   NA 139 05/22/2017 1151   K 4.2 05/22/2017 1151   CL 106 05/22/2017 1151   CO2 30 05/22/2017 1151   GLUCOSE 102 (H) 05/22/2017 1151   BUN 11 05/22/2017 1151   CREATININE 0.84 05/22/2017 1151   CALCIUM 9.6 05/22/2017 1151   PROT 6.5 05/22/2017 1151   ALBUMIN 3.8 05/22/2017 1151   AST 12 05/22/2017 1151   ALT 7 05/22/2017 1151   ALKPHOS 78 05/22/2017 1151   BILITOT 0.3  05/22/2017 1151   GFRNONAA >60 05/23/2016 0435   GFRAA >60 05/23/2016 0435   Lab Results  Component Value Date   CHOL 160 05/22/2017   HDL 51.60 05/22/2017   LDLCALC 86 05/22/2017   LDLDIRECT 164.8 01/17/2010   TRIG 112.0 05/22/2017   CHOLHDL 3 05/22/2017   Lab Results  Component Value Date   HGBA1C 5.5 05/20/2016    Lab Results  Component Value Date   TSH 1.40 05/22/2017      ASSESSMENT AND PLAN  66 y.o. African American female with PMH of CAD/MI 3 s/p 3 drug-eluting stents, hypertension, hyperlipidemia, sickle cell trait, and active smoking, was admitted on 05/19/16 for left facial droop and blurring of vision in left eye. MRI showed right anterior paramedian pontine, right PLIC, right CR, and left parietal operculum infarcts, cardioembolic pattern. MRA head, CTA neck, TTE, TEE, LE venous doppler, TCD MES all negative. Loop recorder placed.Still  smoking  Loop recorder no afib so far. The patient is a current patient of Dr. Erlinda Hong  who is out of the office today . This note is sent to the work in doctor.      Plan:  Stressed the importance of management of risk factors to prevent further stroke Continue aspirin for secondary stroke prevention Maintain strict control of hypertension with blood pressure goal below 130/90, today's reading 121/74 continue antihypertensive medications Control of diabetes with hemoglobin A1c below 6.5 followed by primary care  Cholesterol with LDL cholesterol less than 70, followed by primary care,  continue statin drugs, Lipitor Exercise by walking, Stop smoking   eat healthy diet with whole grains,  fresh fruits and vegetables Discharge from stroke clinic I spent 25 min in total face to face time with the patient more than 50% of which was spent counseling and coordination of care, reviewing test results reviewing medications and discussing and reviewing the diagnosis of stroke and management of risk factors. Reviewed recent labs with  patient. Elizabeth [name], [facility], [facility], APRN  [facility] [address], [place], [place] [address] [phone]

## 2017-05-29 ENCOUNTER — Other Ambulatory Visit: Payer: Self-pay | Admitting: Internal Medicine

## 2017-05-29 ENCOUNTER — Encounter: Payer: Self-pay | Admitting: Nurse Practitioner

## 2017-05-29 ENCOUNTER — Ambulatory Visit (INDEPENDENT_AMBULATORY_CARE_PROVIDER_SITE_OTHER): Payer: 59 | Admitting: Nurse Practitioner

## 2017-05-29 VITALS — BP 121/74 | HR 70 | Wt 131.0 lb

## 2017-05-29 DIAGNOSIS — Z720 Tobacco use: Secondary | ICD-10-CM

## 2017-05-29 DIAGNOSIS — I639 Cerebral infarction, unspecified: Secondary | ICD-10-CM

## 2017-05-29 DIAGNOSIS — I10 Essential (primary) hypertension: Secondary | ICD-10-CM | POA: Diagnosis not present

## 2017-05-29 DIAGNOSIS — E782 Mixed hyperlipidemia: Secondary | ICD-10-CM

## 2017-05-29 NOTE — Patient Instructions (Addendum)
Stressed the importance of management of risk factors to prevent further stroke Continue aspirin for secondary stroke prevention Maintain strict control of hypertension with blood pressure goal below 130/90, today's reading 121/74 continue antihypertensive medications Control of diabetes with hemoglobin A1c below 6.5 followed by primary care  Cholesterol with LDL cholesterol less than 70, followed by primary care,  continue statin drugs, Lipitor Exercise by walking,   eat healthy diet with whole grains,  fresh fruits and vegetables Discharge from stroke clinic  Stroke Prevention Some health problems and behaviors may make it more likely for you to have a stroke. Below are ways to lessen your risk of having a stroke.  Be active for at least 30 minutes on most or all days.  Do not smoke. Try not to be around others who smoke.  Do not drink too much alcohol. ? Do not have more than 2 drinks a day if you are a man. ? Do not have more than 1 drink a day if you are a woman and are not pregnant.  Eat healthy foods, such as fruits and vegetables. If you were put on a specific diet, follow the diet as told.  Keep your cholesterol levels under control through diet and medicines. Look for foods that are low in saturated fat, trans fat, cholesterol, and are high in fiber.  If you have diabetes, follow all diet plans and take your medicine as told.  Ask your doctor if you need treatment to lower your blood pressure. If you have high blood pressure (hypertension), follow all diet plans and take your medicine as told by your doctor.  If you are 11-10 years old, have your blood pressure checked every 3-5 years. If you are age 57 or older, have your blood pressure checked every year.  Keep a healthy weight. Eat foods that are low in calories, salt, saturated fat, trans fat, and cholesterol.  Do not take drugs.  Avoid birth control pills, if this applies. Talk to your doctor about the risks of taking  birth control pills.  Talk to your doctor if you have sleep problems (sleep apnea).  Take all medicine as told by your doctor. ? You may be told to take aspirin or blood thinner medicine. Take this medicine as told by your doctor. ? Understand your medicine instructions.  Make sure any other conditions you have are being taken care of.  Get help right away if:  You suddenly lose feeling (you feel numb) or have weakness in your face, arm, or leg.  Your face or eyelid hangs down to one side.  You suddenly feel confused.  You have trouble talking (aphasia) or understanding what people are saying.  You suddenly have trouble seeing in one or both eyes.  You suddenly have trouble walking.  You are dizzy.  You lose your balance or your movements are clumsy (uncoordinated).  You suddenly have a very bad headache and you do not know the cause.  You have new chest pain.  Your heart feels like it is fluttering or skipping a beat (irregular heartbeat). Do not wait to see if the symptoms above go away. Get help right away. Call your local emergency services (911 in [place] Do not drive yourself to the hospital. This information is not intended to replace advice given to you by your health care provider. Make sure you discuss any questions you have with your health care provider. Document Released: 03/10/2012 Document Revised: 02/15/2016 Document Reviewed: 03/12/2013 Elsevier Interactive Patient Education  2018 Elsevier Inc.  

## 2017-05-29 NOTE — Telephone Encounter (Signed)
Done erx 

## 2017-05-30 NOTE — Progress Notes (Signed)
I have read the note, and I agree with the clinical assessment and plan.  Richard A. Sater, MD, PhD, FAAN Certified in Neurology, Clinical Neurophysiology, Sleep Medicine, Pain Medicine and Neuroimaging  [facility] [address], [address] [place], [place] [address] [phone]  

## 2017-06-03 ENCOUNTER — Other Ambulatory Visit: Payer: Self-pay | Admitting: Internal Medicine

## 2017-06-03 DIAGNOSIS — Z1231 Encounter for screening mammogram for malignant neoplasm of breast: Secondary | ICD-10-CM

## 2017-06-05 ENCOUNTER — Telehealth: Payer: Self-pay

## 2017-06-05 ENCOUNTER — Ambulatory Visit (INDEPENDENT_AMBULATORY_CARE_PROVIDER_SITE_OTHER): Payer: 59 | Admitting: General Practice

## 2017-06-05 ENCOUNTER — Telehealth: Payer: Self-pay | Admitting: Internal Medicine

## 2017-06-05 ENCOUNTER — Ambulatory Visit (INDEPENDENT_AMBULATORY_CARE_PROVIDER_SITE_OTHER)
Admission: RE | Admit: 2017-06-05 | Discharge: 2017-06-05 | Disposition: A | Discharge status: Home / Self Care | Payer: 59 | Source: Ambulatory Visit | Attending: Internal Medicine | Admitting: Internal Medicine

## 2017-06-05 ENCOUNTER — Encounter: Payer: Self-pay | Admitting: Internal Medicine

## 2017-06-05 ENCOUNTER — Other Ambulatory Visit: Payer: Self-pay | Admitting: Internal Medicine

## 2017-06-05 DIAGNOSIS — Z23 Encounter for immunization: Secondary | ICD-10-CM | POA: Diagnosis not present

## 2017-06-05 DIAGNOSIS — M810 Age-related osteoporosis without current pathological fracture: Secondary | ICD-10-CM

## 2017-06-05 DIAGNOSIS — E2839 Other primary ovarian failure: Secondary | ICD-10-CM | POA: Diagnosis not present

## 2017-06-05 HISTORY — DX: Age-related osteoporosis without current pathological fracture: M81.0

## 2017-06-05 MED ORDER — NITROGLYCERIN 0.4 MG/SPRAY TL SOLN: 1.0000 | 0 refills | Status: DC | PRN

## 2017-06-05 MED ORDER — ALENDRONATE SODIUM 70 MG PO TABS: 70.0000 mg | ORAL_TABLET | ORAL | 3 refills | Status: DC

## 2017-06-05 NOTE — Progress Notes (Signed)
Medical screening examination/treatment/procedure(s) were performed by non-physician practitioner and as supervising physician I was immediately available for consultation/collaboration. I agree with above. James John, MD   

## 2017-06-05 NOTE — Telephone Encounter (Signed)
Pt stopped by requesting a refill on nitroGLYCERIN (NITROLINGUAL) 0.4 MG/SPRAY spray. She said that she mentioned it to the assistant the last time she was here but forgot to tell Dr Jenny Reichmann. Can this be sent in for her? [facility] uses [facility] on [address].

## 2017-06-05 NOTE — Telephone Encounter (Signed)
Called pt, LVM.   

## 2017-06-05 NOTE — Telephone Encounter (Signed)
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  We should start treatment with fosamax 70 mg weekly, to help stabilize this and reduce your risk of hip fracture with a fall.  I will send a new prescription, and you should hear from the office as well.Redmond Baseman to please inform pt, I will do rx

## 2017-06-16 ENCOUNTER — Encounter: Payer: Self-pay | Admitting: Internal Medicine

## 2017-06-16 ENCOUNTER — Ambulatory Visit (INDEPENDENT_AMBULATORY_CARE_PROVIDER_SITE_OTHER): Payer: 59 | Admitting: *Deleted

## 2017-06-16 DIAGNOSIS — I639 Cerebral infarction, unspecified: Secondary | ICD-10-CM | POA: Diagnosis not present

## 2017-06-16 LAB — COLOGUARD

## 2017-06-16 NOTE — Progress Notes (Signed)
[facility] Summary Report / Loop Recorder 

## 2017-06-17 ENCOUNTER — Ambulatory Visit
Admission: RE | Admit: 2017-06-17 | Discharge: 2017-06-17 | Disposition: A | Discharge status: Home / Self Care | Payer: 59 | Source: Ambulatory Visit | Attending: Internal Medicine | Admitting: Internal Medicine

## 2017-06-17 DIAGNOSIS — Z1231 Encounter for screening mammogram for malignant neoplasm of breast: Secondary | ICD-10-CM

## 2017-06-17 LAB — CUP PACEART REMOTE DEVICE CHECK
MDC IDC PG IMPLANT DT: 20170829
MDC IDC SESS DTM: 20180924104014

## 2017-07-05 ENCOUNTER — Other Ambulatory Visit: Payer: Self-pay | Admitting: Internal Medicine

## 2017-07-16 ENCOUNTER — Ambulatory Visit (INDEPENDENT_AMBULATORY_CARE_PROVIDER_SITE_OTHER): Payer: 59 | Admitting: *Deleted

## 2017-07-16 DIAGNOSIS — I639 Cerebral infarction, unspecified: Secondary | ICD-10-CM

## 2017-07-16 NOTE — Progress Notes (Signed)
[facility] Summary Report / Loop Recorder 

## 2017-07-17 LAB — CUP PACEART REMOTE DEVICE CHECK
Date Time Interrogation Session: 20181024121409
Implantable Pulse Generator Implant Date: 20170829

## 2017-08-12 ENCOUNTER — Other Ambulatory Visit: Payer: Self-pay | Admitting: Internal Medicine

## 2017-08-18 ENCOUNTER — Ambulatory Visit (INDEPENDENT_AMBULATORY_CARE_PROVIDER_SITE_OTHER): Payer: 59 | Admitting: *Deleted

## 2017-08-18 DIAGNOSIS — I639 Cerebral infarction, unspecified: Secondary | ICD-10-CM

## 2017-08-18 NOTE — Progress Notes (Signed)
[facility] Summary Report / Loop Recorder 

## 2017-09-02 LAB — CUP PACEART REMOTE DEVICE CHECK
MDC IDC PG IMPLANT DT: 20170829
MDC IDC SESS DTM: 20181123123952

## 2017-09-15 ENCOUNTER — Ambulatory Visit (INDEPENDENT_AMBULATORY_CARE_PROVIDER_SITE_OTHER): Payer: 59 | Admitting: *Deleted

## 2017-09-15 DIAGNOSIS — I639 Cerebral infarction, unspecified: Secondary | ICD-10-CM

## 2017-09-17 NOTE — Progress Notes (Signed)
[facility] Summary Report / Loop Recorder 

## 2017-09-26 LAB — CUP PACEART REMOTE DEVICE CHECK
Implantable Pulse Generator Implant Date: 20170829
MDC IDC SESS DTM: 20181223124039

## 2017-10-14 ENCOUNTER — Ambulatory Visit (INDEPENDENT_AMBULATORY_CARE_PROVIDER_SITE_OTHER): Payer: 59 | Admitting: *Deleted

## 2017-10-14 DIAGNOSIS — I639 Cerebral infarction, unspecified: Secondary | ICD-10-CM

## 2017-10-14 NOTE — Progress Notes (Signed)
[facility] Summary Report / Loop Recorder 

## 2017-10-24 LAB — CUP PACEART REMOTE DEVICE CHECK
Date Time Interrogation Session: 20190122144240
Implantable Pulse Generator Implant Date: 20170829

## 2017-11-17 ENCOUNTER — Ambulatory Visit (INDEPENDENT_AMBULATORY_CARE_PROVIDER_SITE_OTHER): Payer: 59 | Admitting: *Deleted

## 2017-11-17 DIAGNOSIS — I639 Cerebral infarction, unspecified: Secondary | ICD-10-CM | POA: Diagnosis not present

## 2017-11-17 NOTE — Progress Notes (Signed)
[facility] Summary Report / Loop Recorder 

## 2017-11-20 ENCOUNTER — Ambulatory Visit: Payer: Medicare Other | Admitting: Internal Medicine

## 2017-11-29 ENCOUNTER — Other Ambulatory Visit: Payer: Self-pay | Admitting: Internal Medicine

## 2017-12-11 ENCOUNTER — Ambulatory Visit: Payer: 59 | Admitting: Internal Medicine

## 2017-12-19 ENCOUNTER — Other Ambulatory Visit (INDEPENDENT_AMBULATORY_CARE_PROVIDER_SITE_OTHER): Payer: 59

## 2017-12-19 ENCOUNTER — Ambulatory Visit (INDEPENDENT_AMBULATORY_CARE_PROVIDER_SITE_OTHER): Payer: 59 | Admitting: *Deleted

## 2017-12-19 ENCOUNTER — Encounter: Payer: Self-pay | Admitting: Internal Medicine

## 2017-12-19 ENCOUNTER — Ambulatory Visit (INDEPENDENT_AMBULATORY_CARE_PROVIDER_SITE_OTHER): Payer: 59 | Admitting: Internal Medicine

## 2017-12-19 VITALS — BP 112/76 | HR 72 | Temp 97.8°F | Ht 63.0 in | Wt 130.0 lb

## 2017-12-19 DIAGNOSIS — M25572 Pain in left ankle and joints of left foot: Secondary | ICD-10-CM | POA: Diagnosis not present

## 2017-12-19 DIAGNOSIS — Z0001 Encounter for general adult medical examination with abnormal findings: Secondary | ICD-10-CM | POA: Diagnosis not present

## 2017-12-19 DIAGNOSIS — M25511 Pain in right shoulder: Secondary | ICD-10-CM

## 2017-12-19 DIAGNOSIS — R739 Hyperglycemia, unspecified: Secondary | ICD-10-CM | POA: Diagnosis not present

## 2017-12-19 DIAGNOSIS — Z23 Encounter for immunization: Secondary | ICD-10-CM | POA: Diagnosis not present

## 2017-12-19 DIAGNOSIS — M542 Cervicalgia: Secondary | ICD-10-CM

## 2017-12-19 DIAGNOSIS — Z0000 Encounter for general adult medical examination without abnormal findings: Secondary | ICD-10-CM | POA: Diagnosis not present

## 2017-12-19 DIAGNOSIS — I639 Cerebral infarction, unspecified: Secondary | ICD-10-CM

## 2017-12-19 LAB — LIPID PANEL
CHOLESTEROL: 147 mg/dL (ref 0–200)
HDL: 47.3 mg/dL (ref >39.00)
LDL Cholesterol: 82 mg/dL (ref 0–99)
NonHDL: 99.81
Total CHOL/HDL Ratio: 3
Triglycerides: 88 mg/dL (ref 0.0–149.0)
VLDL: 17.6 mg/dL (ref 0.0–40.0)

## 2017-12-19 LAB — HEMOGLOBIN A1C: HEMOGLOBIN A1C: 5.7 % (ref 4.6–6.5)

## 2017-12-19 LAB — URINALYSIS, ROUTINE W REFLEX MICROSCOPIC
BILIRUBIN URINE: NEGATIVE
Hgb urine dipstick: NEGATIVE
Ketones, ur: NEGATIVE
LEUKOCYTES UA: NEGATIVE
NITRITE: NEGATIVE
PH: 7 (ref 5.0–8.0)
RBC / HPF: NONE SEEN (ref 0–?)
SPECIFIC GRAVITY, URINE: 1.015 (ref 1.000–1.030)
Total Protein, Urine: NEGATIVE
Urine Glucose: NEGATIVE
Urobilinogen, UA: 1 (ref 0.0–1.0)

## 2017-12-19 LAB — CBC WITH DIFFERENTIAL/PLATELET
Basophils Absolute: 0.1 10*3/uL (ref 0.0–0.1)
Basophils Relative: 1.2 % (ref 0.0–3.0)
EOS ABS: 0.2 10*3/uL (ref 0.0–0.7)
Eosinophils Relative: 2.2 % (ref 0.0–5.0)
HEMATOCRIT: 41.4 % (ref 36.0–46.0)
HEMOGLOBIN: 14 g/dL (ref 12.0–15.0)
LYMPHS PCT: 37.4 % (ref 12.0–46.0)
Lymphs Abs: 2.7 10*3/uL (ref 0.7–4.0)
MCHC: 33.7 g/dL (ref 30.0–36.0)
MCV: 89.3 fl (ref 78.0–100.0)
MONOS PCT: 8.3 % (ref 3.0–12.0)
Monocytes Absolute: 0.6 10*3/uL (ref 0.1–1.0)
NEUTROS ABS: 3.7 10*3/uL (ref 1.4–7.7)
Neutrophils Relative %: 50.9 % (ref 43.0–77.0)
Platelets: 158 10*3/uL (ref 150.0–400.0)
RBC: 4.64 Mil/uL (ref 3.87–5.11)
RDW: 14.1 % (ref 11.5–15.5)
WBC: 7.2 10*3/uL (ref 4.0–10.5)

## 2017-12-19 LAB — BASIC METABOLIC PANEL
BUN: 10 mg/dL (ref 6–23)
CALCIUM: 8.8 mg/dL (ref 8.4–10.5)
CO2: 28 mEq/L (ref 19–32)
CREATININE: 0.87 mg/dL (ref 0.40–1.20)
Chloride: 104 mEq/L (ref 96–112)
GFR: 83.56 mL/min (ref >60.00)
Glucose, Bld: 112 mg/dL — ABNORMAL HIGH (ref 70–99)
Potassium: 4.4 mEq/L (ref 3.5–5.1)
Sodium: 137 mEq/L (ref 135–145)

## 2017-12-19 LAB — HEPATIC FUNCTION PANEL
ALT: 10 U/L (ref 0–35)
AST: 15 U/L (ref 0–37)
Albumin: 3.6 g/dL (ref 3.5–5.2)
Alkaline Phosphatase: 68 U/L (ref 39–117)
BILIRUBIN DIRECT: 0.1 mg/dL (ref 0.0–0.3)
TOTAL PROTEIN: 6.6 g/dL (ref 6.0–8.3)
Total Bilirubin: 0.4 mg/dL (ref 0.2–1.2)

## 2017-12-19 LAB — TSH: TSH: 1.82 u[IU]/mL (ref 0.35–4.50)

## 2017-12-19 MED ORDER — ZOSTER VAC RECOMB ADJUVANTED 50 MCG/0.5ML IM SUSR: 0.5000 mL | Freq: Once | INTRAMUSCULAR | 1 refills | Status: AC

## 2017-12-19 NOTE — Patient Instructions (Addendum)
You had the Pneumovax pneumonia shot today  You will be contacted regarding the referral for: colonoscopy  Please make your yearly Wellness Visit with Sharee Pimple at the checkout desk  Your shingles shot prescription was sent to the pharmacy  Please continue all other medications as before, and refills have been done if requested.  Please have the pharmacy call with any other refills you may need.  Please continue your efforts at being more active, low cholesterol diet, and weight control.  You are otherwise up to date with prevention measures today.  Please keep your appointments with your specialists as you may have planned  Please go to the LAB in the Basement (turn left off the elevator) for the tests to be done today  You will be contacted by phone if any changes need to be made immediately.  Otherwise, you will receive a letter about your results with an explanation, but please check with MyChart first.  Please remember to sign up for MyChart if you have not done so, as this will be important to you in the future with finding out test results, communicating by private email, and scheduling acute appointments online when needed.  Please return in 6 months, or sooner if needed

## 2017-12-19 NOTE — Progress Notes (Signed)
Subjective:    Patient ID: Elizabeth [name], female    DOB: [birth year], 67 y.o.   MRN: [id number]  HPI  Here for wellness and f/u;  Overall doing ok;  Pt denies Chest pain, worsening SOB, DOE, wheezing, orthopnea, PND, worsening LE edema, palpitations, dizziness or syncope.  Pt denies neurological change such as new headache, facial or extremity weakness.  Pt denies polydipsia, polyuria, or low sugar symptoms. Pt states overall good compliance with treatment and medications, good tolerability, and has been trying to follow appropriate diet.  Pt denies worsening depressive symptoms, suicidal ideation or panic. No fever, night sweats, wt loss, loss of appetite, or other constitutional symptoms.  Pt states good ability with ADL's, has low fall risk, home safety reviewed and adequate, no other significant changes in hearing or vision, and only occasionally active with exercise.  Due for colonoscopy, was not able to manage the cologuard well.   Also has intermittent right neck, shoudler and left ankle pain without swelling or radicular symptoms.  No other exam findings or new complaints Past Medical History:  Diagnosis Date  . Abrasion or friction burn of foot and toe(s), with infection   . Allergy    soy  . Anxiety   . 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 Degenerative joint disease    Generalized  . Hyperlipidemia   . Hypertension   . Myocardial infarction ([place])   . Osteoporosis 06/05/2017  . Sickle cell trait ([place])   . Stroke ([place])   . Tobacco abuse April, 2011   Continued   Past Surgical History:  Procedure Laterality Date  . ANGIOPLASTY  2006   x3 stents  . DILATION AND CURETTAGE OF UTERUS    . EP IMPLANTABLE DEVICE N/A 05/21/2016   Procedure: Loop Recorder Insertion;  Surgeon: Thompson Grayer, MD;  Location: [place] [facility];  Service: Cardiovascular;  Laterality: N/A;  . FOOT SURGERY  2000    . LITHOTRIPSY  2008   left kidney stone and also ureteral stent placed  . TEE WITHOUT CARDIOVERSION N/A 05/21/2016   Procedure: TRANSESOPHAGEAL ECHOCARDIOGRAM (TEE);  Surgeon: Lelon Perla, MD;  Location: [facility] ENDOSCOPY;  Service: Cardiovascular;  Laterality: N/A;    reports that she has quit smoking. Her smoking use included cigarettes. She has never used smokeless tobacco. She reports that she does not drink alcohol or use drugs. family history includes Coronary artery disease in her father and mother; Heart disease in her brother; Stroke in her mother. Allergies  Allergen Reactions  . Shrimp [Shellfish Allergy] Nausea And Vomiting  . Adhesive [Tape] Other (See Comments)    Unknown  . Soy Allergy Nausea And Vomiting   Current Outpatient Medications on File Prior to Visit  Medication Sig Dispense Refill  . alendronate (FOSAMAX) 70 MG tablet Take 1 tablet (70 mg total) by mouth every 7 (seven) days. Take with a full glass of water on an empty stomach. 12 tablet 3  . amLODipine (NORVASC) 2.5 MG tablet TAKE 1 TABLET BY MOUTH EVERY DAY 90 tablet 1  . atorvastatin (LIPITOR) 80 MG tablet TAKE 1 TABLET (80 MG TOTAL) BY MOUTH DAILY AT 6 PM. 90 tablet 1  . [facility] ASPIRIN EC 325 MG EC tablet TAKE 1 TABLET BY MOUTH EVERY DAY 90 tablet 1  . escitalopram (LEXAPRO) 10 MG tablet TAKE 1 TABLET BY MOUTH EVERY DAY 90 tablet 3  .  ezetimibe (ZETIA) 10 MG tablet TAKE 1 TABLET BY MOUTH EVERY DAY 90 tablet 1  . irbesartan (AVAPRO) 300 MG tablet TAKE 1 TABLET BY MOUTH EVERY DAY 90 tablet 1  . metoprolol tartrate (LOPRESSOR) 50 MG tablet Take 1 tablet (50 mg total) by mouth 2 (two) times daily. 180 tablet 3  . nitroGLYCERIN (NITROLINGUAL) 0.4 MG/SPRAY spray PLACE 1 SPRAY UNDER THE TONGUE EVERY 5 (FIVE) MINUTES AS NEEDED. MAY REPEAT UP TO 3 DOSES. 12 g 0   No current facility-administered medications on file prior to visit.    Review of Systems Constitutional: Negative for other unusual diaphoresis, sweats,  appetite or weight changes HENT: Negative for other worsening hearing loss, ear pain, facial swelling, mouth sores or neck stiffness.   Eyes: Negative for other worsening pain, redness or other visual disturbance.  Respiratory: Negative for other stridor or swelling Cardiovascular: Negative for other palpitations or other chest pain  Gastrointestinal: Negative for worsening diarrhea or loose stools, blood in stool, distention or other pain Genitourinary: Negative for hematuria, flank pain or other change in urine volume.  Musculoskeletal: Negative for myalgias or other joint swelling.  Skin: Negative for other color change, or other wound or worsening drainage.  Neurological: Negative for other syncope or numbness. Hematological: Negative for other adenopathy or swelling Psychiatric/Behavioral: Negative for hallucinations, other worsening agitation, SI, self-injury, or new decreased concentration All other system neg per pt    Objective:   Physical Exam BP 112/76   Pulse 72   Temp 97.8 F (36.6 C) (Oral)   Ht 5\' 3"  (1.6 m)   Wt 130 lb (59 kg)   SpO2 97%   BMI 23.03 kg/m  VS noted,  Constitutional: Pt is oriented to person, place, and time. Appears well-developed and well-nourished, in no significant distress and comfortable Head: Normocephalic and atraumatic  Eyes: Conjunctivae and EOM are normal. Pupils are equal, round, and reactive to light Right Ear: External ear normal without discharge Left Ear: External ear normal without discharge Nose: Nose without discharge or deformity Mouth/Throat: Oropharynx is without other ulcerations and moist  Neck: Normal range of motion. Neck supple. No JVD present. No tracheal deviation present or significant neck LA or mass Cardiovascular: Normal rate, regular rhythm, normal heart sounds and intact distal pulses.   Pulmonary/Chest: WOB normal and breath sounds without rales or wheezing  Abdominal: Soft. Bowel sounds are normal. NT. No HSM    Musculoskeletal: Normal range of motion. Exhibits no edema, tender right neck and subacromial; left ankle NT or swelling Lymphadenopathy: Has no other cervical adenopathy.  Neurological: Pt is alert and oriented to person, place, and time. Pt has normal reflexes. No cranial nerve deficit. Motor grossly intact, Gait intact Skin: Skin is warm and dry. No rash noted or new ulcerations Psychiatric:  Has normal mood and affect. Behavior is normal without agitation No other exam findings    Assessment & Plan:

## 2017-12-21 NOTE — Assessment & Plan Note (Signed)
?  bursitis, for sport medicine f/u

## 2017-12-21 NOTE — Assessment & Plan Note (Signed)

## 2017-12-21 NOTE — Assessment & Plan Note (Signed)
Lab Results  Component Value Date   HGBA1C 5.7 12/19/2017  stable overall by history and exam, recent data reviewed with pt, and pt to continue medical treatment as before,  to f/u any worsening symptoms or concerns

## 2017-12-22 LAB — CUP PACEART REMOTE DEVICE CHECK
MDC IDC PG IMPLANT DT: 20170829
MDC IDC SESS DTM: 20190224184030

## 2017-12-22 NOTE — Progress Notes (Signed)
[facility] Summary Report / Loop Recorder 

## 2017-12-31 DIAGNOSIS — M5030 Other cervical disc degeneration, unspecified cervical region: Secondary | ICD-10-CM | POA: Diagnosis not present

## 2017-12-31 DIAGNOSIS — M25511 Pain in right shoulder: Secondary | ICD-10-CM | POA: Diagnosis not present

## 2018-01-21 ENCOUNTER — Ambulatory Visit (INDEPENDENT_AMBULATORY_CARE_PROVIDER_SITE_OTHER): Payer: 59 | Admitting: *Deleted

## 2018-01-21 DIAGNOSIS — I639 Cerebral infarction, unspecified: Secondary | ICD-10-CM

## 2018-01-22 NOTE — Progress Notes (Signed)
[facility] Summary Report / Loop Recorder 

## 2018-01-23 LAB — CUP PACEART REMOTE DEVICE CHECK
Implantable Pulse Generator Implant Date: 20170829
MDC IDC SESS DTM: 20190329193909

## 2018-02-16 LAB — CUP PACEART REMOTE DEVICE CHECK
Implantable Pulse Generator Implant Date: 20170829
MDC IDC SESS DTM: 20190501193734

## 2018-02-23 ENCOUNTER — Ambulatory Visit (INDEPENDENT_AMBULATORY_CARE_PROVIDER_SITE_OTHER): Payer: 59 | Admitting: *Deleted

## 2018-02-23 DIAGNOSIS — I639 Cerebral infarction, unspecified: Secondary | ICD-10-CM | POA: Diagnosis not present

## 2018-02-24 NOTE — Progress Notes (Signed)
[facility] Summary Report / Loop Recorder 

## 2018-02-27 ENCOUNTER — Encounter: Payer: Self-pay | Admitting: Internal Medicine

## 2018-03-30 ENCOUNTER — Ambulatory Visit (INDEPENDENT_AMBULATORY_CARE_PROVIDER_SITE_OTHER): Payer: 59 | Admitting: *Deleted

## 2018-03-30 DIAGNOSIS — I639 Cerebral infarction, unspecified: Secondary | ICD-10-CM | POA: Diagnosis not present

## 2018-03-30 NOTE — Progress Notes (Signed)
[facility] Summary Report / Loop Recorder 

## 2018-04-01 LAB — CUP PACEART REMOTE DEVICE CHECK
Date Time Interrogation Session: 20190603200721
MDC IDC PG IMPLANT DT: 20170829

## 2018-04-14 IMAGING — MR MR MRA HEAD W/O CM
9 of 13 series · 30 of 48 positions shown · non-contrast
Comparison: 05/19/2016 CT head.

CLINICAL DATA: 65 y/o  F; left facial droop and slurred speech.

EXAM:
MRI HEAD WITHOUT CONTRAST
MRA HEAD WITHOUT CONTRAST
TECHNIQUE: Multiplanar, multiecho pulse sequences of the brain and surrounding
structures were obtained without intravenous contrast. Angiographic
images of the head were obtained using MRA technique without
contrast.

[Series 3: T1 · sagittal · 5.0mm · 0.47mm/px · 1 of 27 slices shown]
[im 1/27]
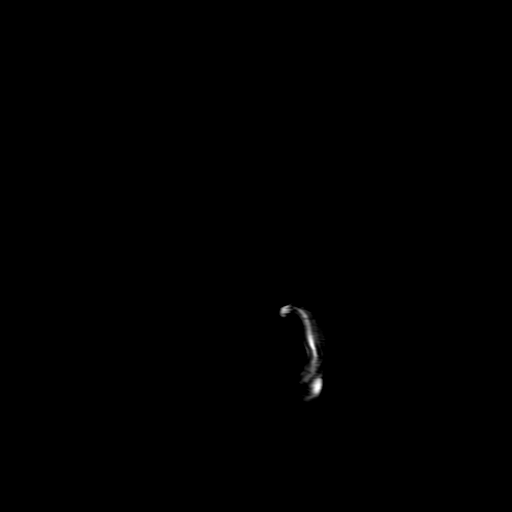

[Series 4: DWI · axial · 3.0mm · 1.09mm/px · z∈[-77,+77]mm · 7 of 106 slices shown (1 of 4)]
[im 1/106]
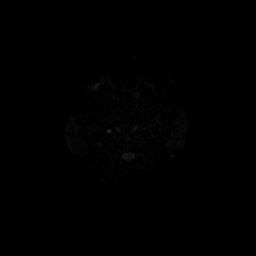
[im 18/106]
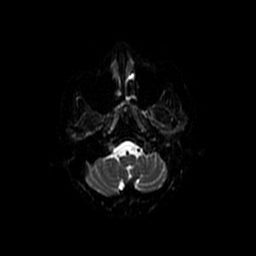
[im 36/106]
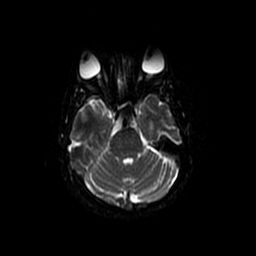
[im 53/106]
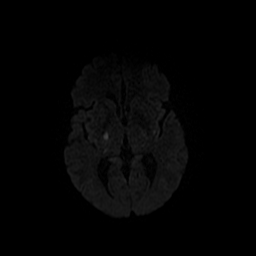
[im 71/106]
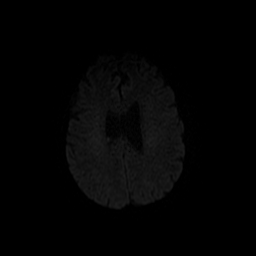
[im 88/106]
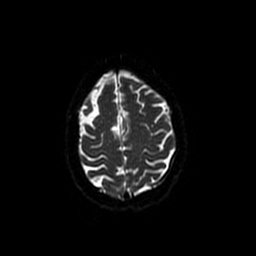
[im 106/106]
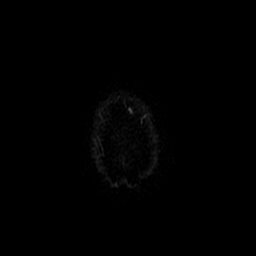

[Series 5: DWI · coronal · 5.0mm · 1.09mm/px · 4 of 74 slices shown (2 of 4)]
[im 1/74]
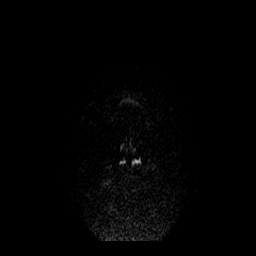
[im 25/74]
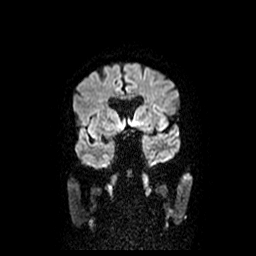
[im 49/74]
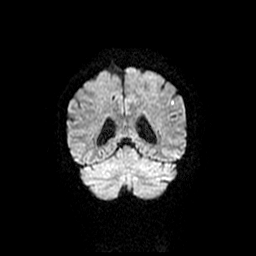
[im 74/74]
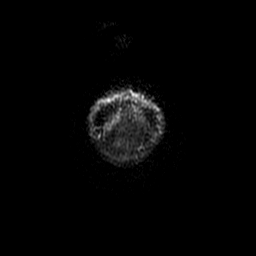

[Series 6: (id) mt fs · axial · 1.4mm · 0.43mm/px · z∈[-95,-33]mm · 5 of 156 slices shown]
[im 1/156]
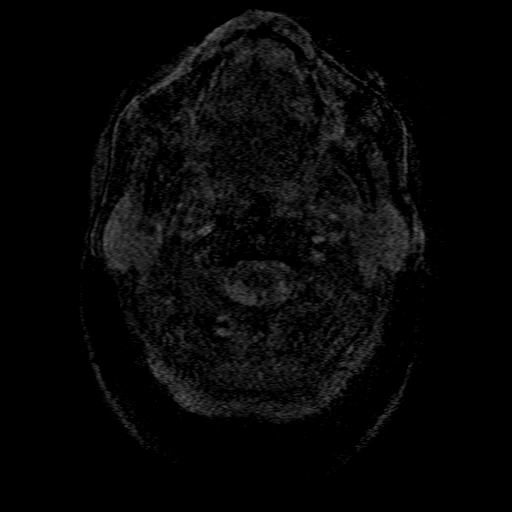
[im 32/156]
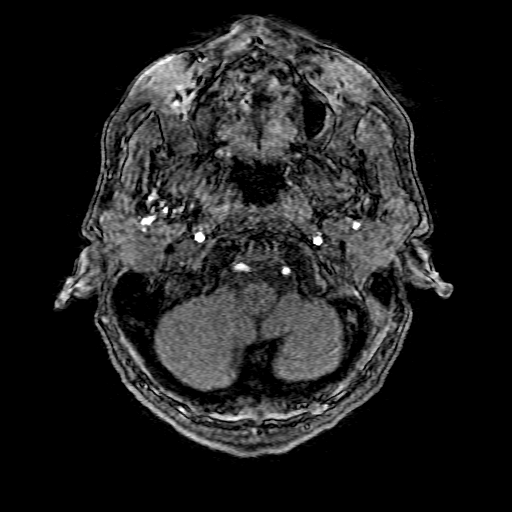
[im 47/156]
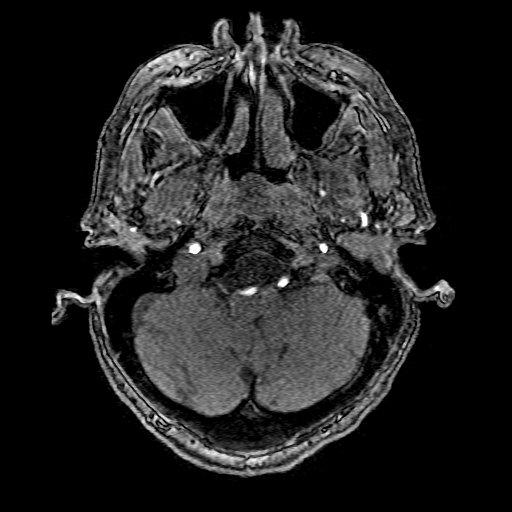
[im 63/156]
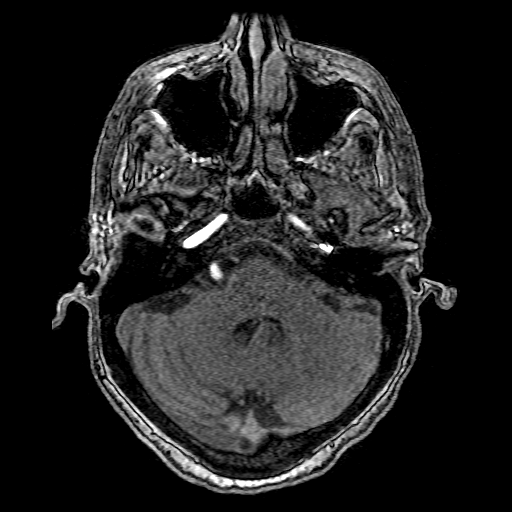
[im 94/156]
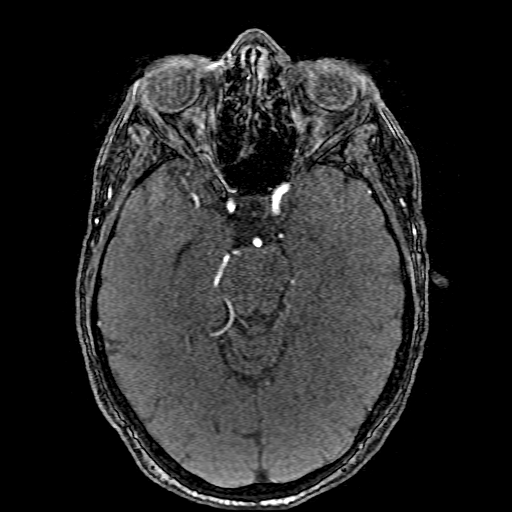

[Series 9: FLAIR · axial · 5.0mm · 0.43mm/px · z∈[-71,+83]mm · 2 of 27 slices shown]
[im 1/27]
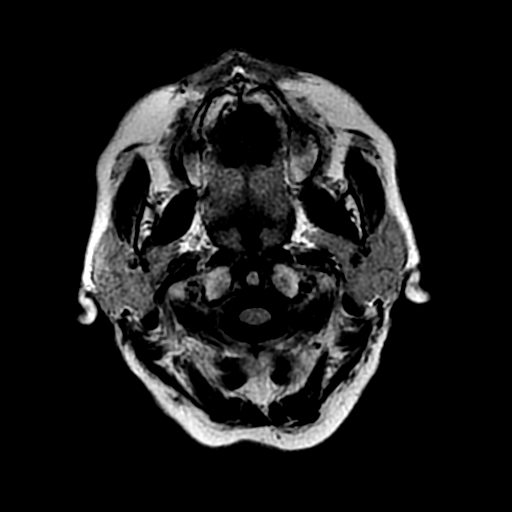
[im 27/27]
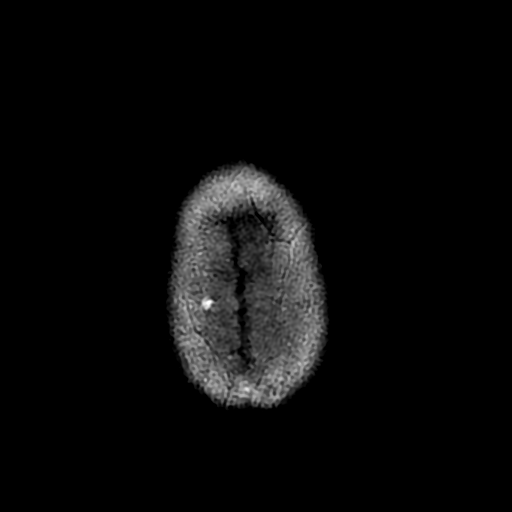

[Series 11: T2 · coronal · 5.0mm · 0.39mm/px · 2 of 28 slices shown (1 of 2)]
[im 1/28]
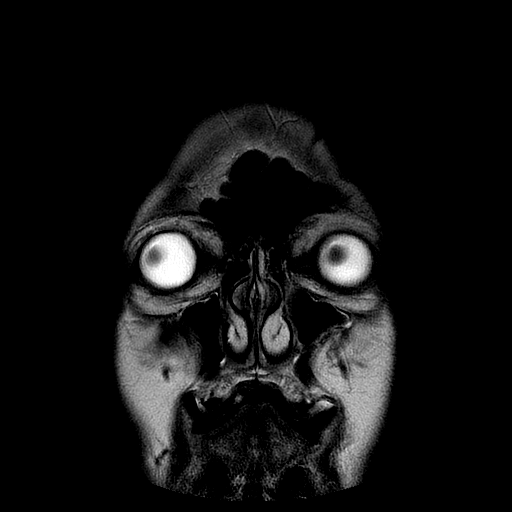
[im 28/28]
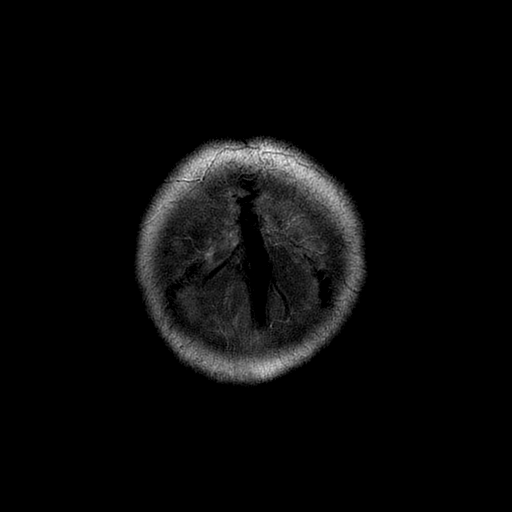

[Series 12: T2 · axial · 5.0mm · 0.43mm/px · z∈[-71,+83]mm · 2 of 27 slices shown (2 of 2)]
[im 1/27]
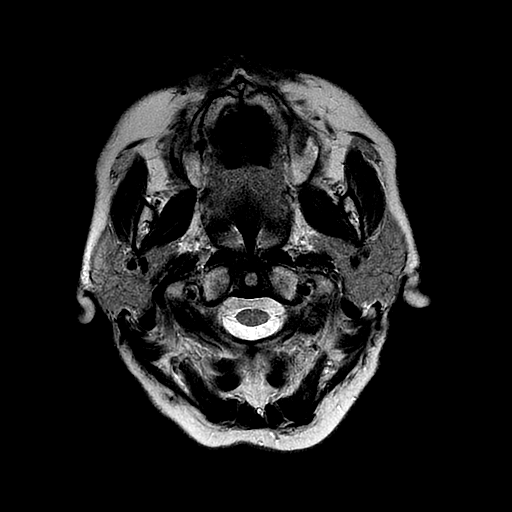
[im 27/27]
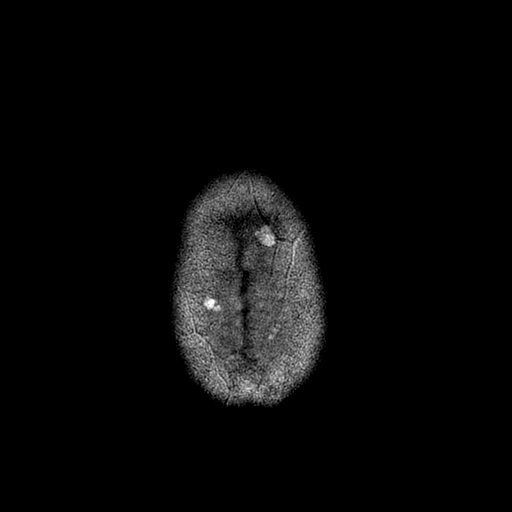

[Series 400: DWI · axial · 3.0mm · 1.09mm/px · z∈[-77,+77]mm · 4 of 53 slices shown (3 of 4)]
[im 1/53]
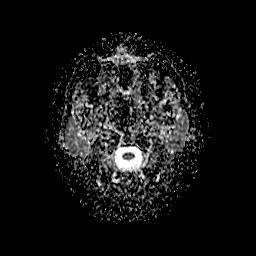
[im 18/53]
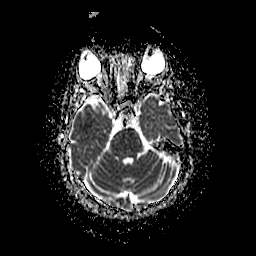
[im 35/53]
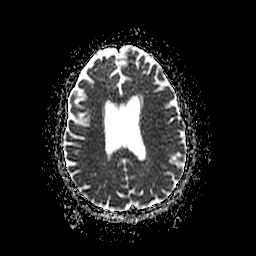
[im 53/53]
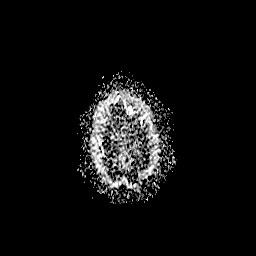

[Series 500: DWI · coronal · 5.0mm · 1.09mm/px · 3 of 37 slices shown (4 of 4)]
[im 1/37]
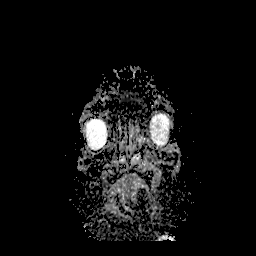
[im 19/37]
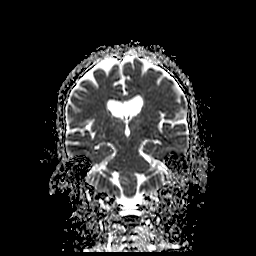
[im 37/37]
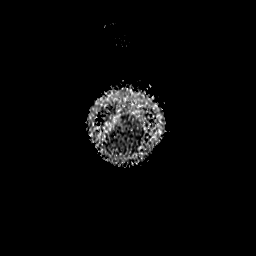

[30 of 48 positions shown; findings below may reference images not displayed]

FINDINGS: MRI HEAD FINDINGS

Brain: There are small foci of diffusion restriction within the
right anterior paramedian superior pons, the right posterior limb of
internal capsule, the right mid corona radiata, and the left
parietal operculum consistent with acute infarction. There
additional foci of diffusion hyperintensity without low ADC in the
left splenium of corpus callosum and left occipital lobe which may
represent late subacute/chronic area of ischemia. Chronic lacunar
infarct within the right inferior pons and probable small old
infarct in the right posterior medial frontal lobe. No abnormal
susceptibility hypointensity to indicate intracranial hemorrhage.

Extra-axial space: No hydrocephalus. No extra-axial collection is
identified. Cavum septum pellucidum.

Other: Mild maxillary sinus mucosal thickening. No abnormal signal
of the mastoid air cells. Orbits are unremarkable. Calvarium is
unremarkable. 13 mm Thornwaldt cyst.

MRA HEAD FINDINGS

Internal carotid arteries: Patent.

Anterior cerebral arteries: Patent.

Middle cerebral arteries: Patent.

Anterior communicating artery: Patent.

Posterior communicating arteries: Possible diminutive right. No left
identified, hypoplastic or absent.

Posterior cerebral arteries: Patent.

Basilar artery: Patent.

Vertebral arteries: Patent.

No evidence of high-grade stenosis, large vessel occlusion, or
aneurysm unless noted above.
IMPRESSION: 1. Several small acute/early subacute infarctions within the right
anterior paramedian superior pons, right posterior limb of internal
capsule, right mid corona radiata, and left parietal operculum.
Symptoms of left-sided facial droop and slurred speech are probably
attributable to the infarction in right posterior limb of internal
capsule.
2. Small late subacute to chronic infarcts within the left splenium
of corpus callosum, left occipital lobe, and right inferior pons.
3. No acute intracranial hemorrhage.
4. Mild paranasal sinus disease.
5. No occlusion, aneurysm, dissection, or significant stenosis of
the circle of Willis is identified.

By: Nomasibulele Moatshe M.D.

## 2018-04-30 ENCOUNTER — Ambulatory Visit (INDEPENDENT_AMBULATORY_CARE_PROVIDER_SITE_OTHER): Payer: 59 | Admitting: *Deleted

## 2018-04-30 DIAGNOSIS — I639 Cerebral infarction, unspecified: Secondary | ICD-10-CM | POA: Diagnosis not present

## 2018-05-01 NOTE — Progress Notes (Signed)
[facility] Summary Report / Loop Recorder 

## 2018-05-02 LAB — CUP PACEART REMOTE DEVICE CHECK
Date Time Interrogation Session: 20190706200724
Implantable Pulse Generator Implant Date: 20170829

## 2018-05-18 ENCOUNTER — Telehealth: Payer: Self-pay

## 2018-05-18 MED ORDER — LOSARTAN POTASSIUM 100 MG PO TABS: 100.0000 mg | ORAL_TABLET | Freq: Every day | ORAL | 3 refills | Status: DC

## 2018-05-18 NOTE — Addendum Note (Signed)
Addended by: Biagio Borg on: 05/18/2018 01:30 PM   Modules accepted: Orders

## 2018-05-18 NOTE — Telephone Encounter (Signed)
Ok to try change to losartan 100 qd

## 2018-05-18 NOTE — Telephone Encounter (Signed)
Copied from [place] [phone]. Topic: General - Other >> May 18, 2018 12:08 PM Alfredia Ferguson R wrote: Pts husband called in and stated the pharmacy called in and said irbesartan (AVAPRO) 300 MG tablet isnt in stock it is on back order is there something that can be given in place of the med ? She is currently out of the med  Cb# [id number]

## 2018-06-02 ENCOUNTER — Ambulatory Visit (INDEPENDENT_AMBULATORY_CARE_PROVIDER_SITE_OTHER): Payer: 59 | Admitting: *Deleted

## 2018-06-02 DIAGNOSIS — I639 Cerebral infarction, unspecified: Secondary | ICD-10-CM | POA: Diagnosis not present

## 2018-06-03 NOTE — Progress Notes (Signed)
[facility] Summary Report / Loop Recorder 

## 2018-06-10 LAB — CUP PACEART REMOTE DEVICE CHECK
MDC IDC PG IMPLANT DT: 20170829
MDC IDC SESS DTM: 20190808200941

## 2018-06-19 LAB — CUP PACEART REMOTE DEVICE CHECK
Implantable Pulse Generator Implant Date: 20170829
MDC IDC SESS DTM: 20190910204025

## 2018-06-24 ENCOUNTER — Other Ambulatory Visit (INDEPENDENT_AMBULATORY_CARE_PROVIDER_SITE_OTHER): Payer: 59

## 2018-06-24 ENCOUNTER — Encounter: Payer: Self-pay | Admitting: Internal Medicine

## 2018-06-24 ENCOUNTER — Ambulatory Visit (INDEPENDENT_AMBULATORY_CARE_PROVIDER_SITE_OTHER): Payer: 59 | Admitting: Internal Medicine

## 2018-06-24 VITALS — BP 116/74 | HR 65 | Temp 97.9°F | Ht 63.0 in | Wt 134.0 lb

## 2018-06-24 DIAGNOSIS — R739 Hyperglycemia, unspecified: Secondary | ICD-10-CM | POA: Diagnosis not present

## 2018-06-24 DIAGNOSIS — Z23 Encounter for immunization: Secondary | ICD-10-CM | POA: Diagnosis not present

## 2018-06-24 DIAGNOSIS — I10 Essential (primary) hypertension: Secondary | ICD-10-CM

## 2018-06-24 DIAGNOSIS — Z0000 Encounter for general adult medical examination without abnormal findings: Secondary | ICD-10-CM | POA: Diagnosis not present

## 2018-06-24 DIAGNOSIS — Z1211 Encounter for screening for malignant neoplasm of colon: Secondary | ICD-10-CM

## 2018-06-24 DIAGNOSIS — E782 Mixed hyperlipidemia: Secondary | ICD-10-CM

## 2018-06-24 DIAGNOSIS — I639 Cerebral infarction, unspecified: Secondary | ICD-10-CM

## 2018-06-24 LAB — URINALYSIS, ROUTINE W REFLEX MICROSCOPIC
Bilirubin Urine: NEGATIVE
Hgb urine dipstick: NEGATIVE
KETONES UR: NEGATIVE
Leukocytes, UA: NEGATIVE
Nitrite: NEGATIVE
RBC / HPF: NONE SEEN (ref 0–?)
Total Protein, Urine: NEGATIVE
URINE GLUCOSE: NEGATIVE
UROBILINOGEN UA: 0.2 (ref 0.0–1.0)
pH: 6 (ref 5.0–8.0)

## 2018-06-24 LAB — BASIC METABOLIC PANEL
BUN: 8 mg/dL (ref 6–23)
CHLORIDE: 106 meq/L (ref 96–112)
CO2: 28 mEq/L (ref 19–32)
CREATININE: 0.87 mg/dL (ref 0.40–1.20)
Calcium: 9.5 mg/dL (ref 8.4–10.5)
GFR: 83.43 mL/min (ref >60.00)
Glucose, Bld: 104 mg/dL — ABNORMAL HIGH (ref 70–99)
Potassium: 4.5 mEq/L (ref 3.5–5.1)
Sodium: 139 mEq/L (ref 135–145)

## 2018-06-24 LAB — HEPATIC FUNCTION PANEL
ALK PHOS: 60 U/L (ref 39–117)
ALT: 10 U/L (ref 0–35)
AST: 16 U/L (ref 0–37)
Albumin: 4 g/dL (ref 3.5–5.2)
BILIRUBIN DIRECT: 0.1 mg/dL (ref 0.0–0.3)
TOTAL PROTEIN: 6.8 g/dL (ref 6.0–8.3)
Total Bilirubin: 0.3 mg/dL (ref 0.2–1.2)

## 2018-06-24 LAB — CBC WITH DIFFERENTIAL/PLATELET
BASOS PCT: 0.6 % (ref 0.0–3.0)
Basophils Absolute: 0.1 10*3/uL (ref 0.0–0.1)
EOS PCT: 2.3 % (ref 0.0–5.0)
Eosinophils Absolute: 0.2 10*3/uL (ref 0.0–0.7)
HCT: 41.7 % (ref 36.0–46.0)
Hemoglobin: 14.2 g/dL (ref 12.0–15.0)
Lymphocytes Relative: 44.5 % (ref 12.0–46.0)
Lymphs Abs: 3.6 10*3/uL (ref 0.7–4.0)
MCHC: 34 g/dL (ref 30.0–36.0)
MCV: 89.7 fl (ref 78.0–100.0)
MONO ABS: 0.6 10*3/uL (ref 0.1–1.0)
Monocytes Relative: 8.1 % (ref 3.0–12.0)
NEUTROS PCT: 44.5 % (ref 43.0–77.0)
Neutro Abs: 3.6 10*3/uL (ref 1.4–7.7)
Platelets: 150 10*3/uL (ref 150.0–400.0)
RBC: 4.64 Mil/uL (ref 3.87–5.11)
RDW: 14.4 % (ref 11.5–15.5)
WBC: 8 10*3/uL (ref 4.0–10.5)

## 2018-06-24 LAB — POCT GLYCOSYLATED HEMOGLOBIN (HGB A1C)
HEMOGLOBIN A1C: 0 % (ref 4.0–5.6)
HbA1c, POC (controlled diabetic range): 0 % (ref 0.0–7.0)
HbA1c, POC (prediabetic range): 0 % — AB (ref 5.7–6.4)
Hemoglobin A1C: 5.4 % (ref 4.0–5.6)

## 2018-06-24 LAB — LIPID PANEL
CHOL/HDL RATIO: 3
CHOLESTEROL: 159 mg/dL (ref 0–200)
HDL: 54.3 mg/dL (ref >39.00)
LDL CALC: 79 mg/dL (ref 0–99)
NONHDL: 104.24
Triglycerides: 127 mg/dL (ref 0.0–149.0)
VLDL: 25.4 mg/dL (ref 0.0–40.0)

## 2018-06-24 LAB — TSH: TSH: 2.56 u[IU]/mL (ref 0.35–4.50)

## 2018-06-24 NOTE — Patient Instructions (Addendum)
You had the flu shot today  You will be contacted regarding the referral for:  Colonoscopy  Your A1c was OK today - 5.4  Please continue all other medications as before, and refills have been done if requested.  Please have the pharmacy call with any other refills you may need.  Please continue your efforts at being more active, low cholesterol diet, and weight control.  Please keep your appointments with your specialists as you may have planned  Please return in 6 months, or sooner if needed, with Lab testing done 3-5 days before

## 2018-06-24 NOTE — Assessment & Plan Note (Signed)
a1c poct is good today, cont same tx - diet

## 2018-06-24 NOTE — Assessment & Plan Note (Signed)
stable overall by history and exam, recent data reviewed with pt, and pt to continue medical treatment as before,  to f/u any worsening symptoms or concerns  

## 2018-06-24 NOTE — Progress Notes (Signed)
Subjective:    Patient ID: Elizabeth [name], female    DOB: 19-Jun-1951, 67 y.o.   MRN: [id number]  HPI  Here to f/u; overall doing ok,  Pt denies chest pain, increasing sob or doe, wheezing, orthopnea, PND, increased LE swelling, palpitations, dizziness or syncope.  Pt denies new neurological symptoms such as new headache, or facial or extremity weakness or numbness.  Pt denies polydipsia, polyuria, or low sugar episode.  Pt states overall good compliance with meds, mostly trying to follow appropriate diet, with wt overall stable,  but little exercise however. Did not do the cologuard but plans to follow through now on colonoscopy.  No new complaints Past Medical History:  Diagnosis Date  . Abrasion or friction burn of foot and toe(s), with infection   . Allergy    soy  . Anxiety   . 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 Degenerative joint disease    Generalized  . Hyperlipidemia   . Hypertension   . Myocardial infarction ([place])   . Osteoporosis 06/05/2017  . Sickle cell trait ([place])   . Stroke ([place])   . Tobacco abuse April, 2011   Continued   Past Surgical History:  Procedure Laterality Date  . ANGIOPLASTY  2006   x3 stents  . DILATION AND CURETTAGE OF UTERUS    . EP IMPLANTABLE DEVICE N/A 05/21/2016   Procedure: Loop Recorder Insertion;  Surgeon: Thompson Grayer, MD;  Location: [place] [facility];  Service: Cardiovascular;  Laterality: N/A;  . FOOT SURGERY  2000  . LITHOTRIPSY  2008   left kidney stone and also ureteral stent placed  . TEE WITHOUT CARDIOVERSION N/A 05/21/2016   Procedure: TRANSESOPHAGEAL ECHOCARDIOGRAM (TEE);  Surgeon: Lelon Perla, MD;  Location: [facility] ENDOSCOPY;  Service: Cardiovascular;  Laterality: N/A;    reports that she has quit smoking. Her smoking use included cigarettes. She has never used smokeless tobacco. She reports that she does not drink alcohol or use  drugs. family history includes Coronary artery disease in her father and mother; Heart disease in her brother; Stroke in her mother. Allergies  Allergen Reactions  . Shrimp [Shellfish Allergy] Nausea And Vomiting  . Adhesive [Tape] Other (See Comments)    Unknown  . Soy Allergy Nausea And Vomiting   Current Outpatient Medications on File Prior to Visit  Medication Sig Dispense Refill  . alendronate (FOSAMAX) 70 MG tablet Take 1 tablet (70 mg total) by mouth every 7 (seven) days. Take with a full glass of water on an empty stomach. 12 tablet 3  . amLODipine (NORVASC) 2.5 MG tablet TAKE 1 TABLET BY MOUTH EVERY DAY 90 tablet 1  . atorvastatin (LIPITOR) 80 MG tablet TAKE 1 TABLET (80 MG TOTAL) BY MOUTH DAILY AT 6 PM. 90 tablet 1  . [facility] ASPIRIN EC 325 MG EC tablet TAKE 1 TABLET BY MOUTH EVERY DAY 90 tablet 1  . escitalopram (LEXAPRO) 10 MG tablet TAKE 1 TABLET BY MOUTH EVERY DAY 90 tablet 3  . ezetimibe (ZETIA) 10 MG tablet TAKE 1 TABLET BY MOUTH EVERY DAY 90 tablet 1  . losartan (COZAAR) 100 MG tablet Take 1 tablet (100 mg total) by mouth daily. 90 tablet 3  . metoprolol tartrate (LOPRESSOR) 50 MG tablet Take 1 tablet (50 mg total) by mouth 2 (two) times daily. 180 tablet 3  . nitroGLYCERIN (NITROLINGUAL) 0.4 MG/SPRAY spray PLACE 1 SPRAY  UNDER THE TONGUE EVERY 5 (FIVE) MINUTES AS NEEDED. MAY REPEAT UP TO 3 DOSES. 12 g 0   No current facility-administered medications on file prior to visit.     Review of Systems  Constitutional: Negative for other unusual diaphoresis or sweats HENT: Negative for ear discharge or swelling Eyes: Negative for other worsening visual disturbances Respiratory: Negative for stridor or other swelling  Gastrointestinal: Negative for worsening distension or other blood Genitourinary: Negative for retention or other urinary change Musculoskeletal: Negative for other MSK pain or swelling Skin: Negative for color change or other new lesions Neurological: Negative for  worsening tremors and other numbness  Psychiatric/Behavioral: Negative for worsening agitation or other fatigue All other system neg per pt    Objective:   Physical Exam BP 116/74   Pulse 65   Temp 97.9 F (36.6 C) (Oral)   Ht 5\' 3"  (1.6 m)   Wt 134 lb (60.8 kg)   SpO2 97%   BMI 23.74 kg/m  VS noted,  Constitutional: Pt appears in NAD HENT: Head: NCAT.  Right Ear: External ear normal.  Left Ear: External ear normal.  Eyes: . Pupils are equal, round, and reactive to light. Conjunctivae and EOM are normal Nose: without d/c or deformity Neck: Neck supple. Gross normal ROM Cardiovascular: Normal rate and regular rhythm.   Pulmonary/Chest: Effort normal and breath sounds without rales or wheezing.  Abd:  Soft, NT, ND, + BS, no organomegaly Neurological: Pt is alert. At baseline orientation, motor grossly intact Skin: Skin is warm. No rashes, other new lesions, no LE edema Psychiatric: Pt behavior is normal without agitation  No other exam findings Lab Results  Component Value Date   WBC 7.2 12/19/2017   HGB 14.0 12/19/2017   HCT 41.4 12/19/2017   PLT 158.0 12/19/2017   GLUCOSE 112 (H) 12/19/2017   CHOL 147 12/19/2017   TRIG 88.0 12/19/2017   HDL 47.30 12/19/2017   LDLDIRECT 164.8 01/17/2010   LDLCALC 82 12/19/2017   ALT 10 12/19/2017   AST 15 12/19/2017   NA 137 12/19/2017   K 4.4 12/19/2017   CL 104 12/19/2017   CREATININE 0.87 12/19/2017   BUN 10 12/19/2017   CO2 28 12/19/2017   TSH 1.82 12/19/2017   INR 0.95 05/19/2016   HGBA1C 5.7 12/19/2017   Contains abnormal data POCT glycosylated hemoglobin (Hb A1C)  Order: [id number]  Status:  Final result Visible to patient:  No (Not Released) Dx:  Hyperglycemia   Ref Range & Units 09:34 71mo ago 23yr ago  Hemoglobin A1C 4.0 - 5.6 % 5.4  5.7 R, CM 5.5 R, CM  HbA1c POC (<> result, manual entry) 4.0 - 5.6 % 0              Assessment & Plan:

## 2018-07-06 ENCOUNTER — Ambulatory Visit (INDEPENDENT_AMBULATORY_CARE_PROVIDER_SITE_OTHER): Payer: 59 | Admitting: *Deleted

## 2018-07-06 DIAGNOSIS — I639 Cerebral infarction, unspecified: Secondary | ICD-10-CM | POA: Diagnosis not present

## 2018-07-07 NOTE — Progress Notes (Signed)
[facility] Summary Report / Loop Recorder 

## 2018-07-15 ENCOUNTER — Other Ambulatory Visit: Payer: Self-pay | Admitting: Internal Medicine

## 2018-07-17 ENCOUNTER — Telehealth: Payer: Self-pay | Admitting: *Deleted

## 2018-07-17 NOTE — Telephone Encounter (Signed)
Hi John, This patient stating she does have a Soy allergy.Last procedure was 2012, fentanyl and versed were used. Please advise if we need to talk about v/f sedation or propofol with previsit scheduled for 07/27/18 karen

## 2018-07-17 NOTE — Telephone Encounter (Signed)
Santiago Glad,  We can do either, which ever she prefers.  Thanks,  Osvaldo Angst

## 2018-07-20 LAB — CUP PACEART REMOTE DEVICE CHECK
Date Time Interrogation Session: 20191013203937
MDC IDC PG IMPLANT DT: 20170829

## 2018-07-27 ENCOUNTER — Ambulatory Visit (AMBULATORY_SURGERY_CENTER): Payer: Self-pay | Admitting: *Deleted

## 2018-07-27 VITALS — Ht 63.0 in | Wt 134.0 lb

## 2018-07-27 DIAGNOSIS — Z86010 Personal history of colonic polyps: Secondary | ICD-10-CM

## 2018-07-27 MED ORDER — NA SULFATE-K SULFATE-MG SULF 17.5-3.13-1.6 GM/177ML PO SOLN: ORAL | 0 refills | Status: DC

## 2018-07-27 NOTE — Progress Notes (Signed)
Patient denies any allergies to eggs. Patient is allergic to SOY! Patient denies any problems with anesthesia/sedation. Patient denies any oxygen use at home. Patient denies taking any diet/weight loss medications or blood thinners. EMMI education offered, pt declined.

## 2018-07-28 ENCOUNTER — Encounter: Payer: Self-pay | Admitting: Internal Medicine

## 2018-08-07 ENCOUNTER — Ambulatory Visit (INDEPENDENT_AMBULATORY_CARE_PROVIDER_SITE_OTHER): Payer: 59 | Admitting: *Deleted

## 2018-08-07 DIAGNOSIS — I639 Cerebral infarction, unspecified: Secondary | ICD-10-CM

## 2018-08-07 NOTE — Progress Notes (Signed)
[facility] Summary Report / Loop Recorder 

## 2018-08-10 ENCOUNTER — Encounter: Payer: Self-pay | Admitting: Internal Medicine

## 2018-08-10 ENCOUNTER — Other Ambulatory Visit: Payer: Self-pay | Admitting: Internal Medicine

## 2018-08-10 ENCOUNTER — Ambulatory Visit (AMBULATORY_SURGERY_CENTER): Payer: 59 | Admitting: Internal Medicine

## 2018-08-10 VITALS — BP 120/67 | HR 52 | Temp 96.8°F | Resp 17 | Ht 63.0 in | Wt 134.0 lb

## 2018-08-10 DIAGNOSIS — K635 Polyp of colon: Secondary | ICD-10-CM

## 2018-08-10 DIAGNOSIS — Z86010 Personal history of colonic polyps: Secondary | ICD-10-CM | POA: Diagnosis not present

## 2018-08-10 DIAGNOSIS — D125 Benign neoplasm of sigmoid colon: Secondary | ICD-10-CM | POA: Diagnosis not present

## 2018-08-10 MED ORDER — SODIUM CHLORIDE 0.9 % IV SOLN: 500.0000 mL | Freq: Once | INTRAVENOUS | Status: DC

## 2018-08-10 NOTE — Progress Notes (Signed)
I have reviewed the patient's medical history in detail and updated the computerized patient record.

## 2018-08-10 NOTE — Progress Notes (Signed)
Report to PACU, RN, vss, BBS= Clear.  

## 2018-08-10 NOTE — Progress Notes (Signed)
Called to room to assist during endoscopic procedure.  Patient ID and intended procedure confirmed with present staff. Received instructions for my participation in the procedure from the performing physician.  

## 2018-08-10 NOTE — Op Note (Signed)
[facility] Patient Name: Elizabeth [name] Procedure Date: 08/10/2018 9:52 AM MRN: [id number] Endoscopist: Jerene Bears , MD Age: 67 Referring MD:  Date of Birth: 1951/03/29 Gender: Female Account #: [id number] Procedure:                Colonoscopy Indications:              High risk colon cancer surveillance: Personal                            history of non-advanced adenoma, Last colonoscopy:                            August 2012 Medicines:                Monitored Anesthesia Care Procedure:                Pre-Anesthesia Assessment:                           - Prior to the procedure, a History and Physical                            was performed, and patient medications and                            allergies were reviewed. The patient's tolerance of                            previous anesthesia was also reviewed. The risks                            and benefits of the procedure and the sedation                            options and risks were discussed with the patient.                            All questions were answered, and informed consent                            was obtained. Prior Anticoagulants: The patient has                            taken no previous anticoagulant or antiplatelet                            agents. ASA Grade Assessment: II - A patient with                            mild systemic disease. After reviewing the risks                            and benefits, the patient was deemed in  satisfactory condition to undergo the procedure.                           After obtaining informed consent, the colonoscope                            was passed under direct vision. Throughout the                            procedure, the patient's blood pressure, pulse, and                            oxygen saturations were monitored continuously. The                            Colonoscope was introduced through the anus and                           advanced to the cecum, identified by appendiceal                            orifice and ileocecal valve. The colonoscopy was                            performed without difficulty. The patient tolerated                            the procedure well. The quality of the bowel                            preparation was good. The ileocecal valve,                            appendiceal orifice, and rectum were photographed. Scope In: 9:55:33 AM Scope Out: 10:12:31 AM Scope Withdrawal Time: 0 hours 11 minutes 47 seconds  Total Procedure Duration: 0 hours 16 minutes 58 seconds  Findings:                 The digital rectal exam was normal.                           A 5 mm polyp was found in the sigmoid colon. The                            polyp was sessile. The polyp was removed with a                            cold snare. Resection and retrieval were complete.                           Multiple small and large-mouthed diverticula were                            found in the sigmoid colon and descending colon.  Internal hemorrhoids were found during                            retroflexion. The hemorrhoids were small. Complications:            No immediate complications. Estimated Blood Loss:     Estimated blood loss was minimal. Impression:               - One 5 mm polyp in the sigmoid colon, removed with                            a cold snare. Resected and retrieved.                           - Diverticulosis in the sigmoid colon and in the                            descending colon.                           - Internal hemorrhoids. Recommendation:           - Patient has a contact number available for                            emergencies. The signs and symptoms of potential                            delayed complications were discussed with the                            patient. Return to normal activities tomorrow.                             Written discharge instructions were provided to the                            patient.                           - Resume previous diet.                           - Continue present medications.                           - Await pathology results.                           - Repeat colonoscopy is recommended for                            surveillance. The colonoscopy date will be                            determined after pathology results from today's  exam become available for review. Jerene Bears, MD 08/10/2018 10:18:37 AM This report has been signed electronically.

## 2018-08-10 NOTE — Patient Instructions (Signed)
  Thank you for allowing [place] to care for you today!  Await pathology results of polyp approximately 2 weeks.  Next colonoscopy will be recommended at that time.  Resume previous diet and medications today.  Return to normal activities tomorrow.    YOU HAD AN ENDOSCOPIC PROCEDURE TODAY AT [facility]:   Refer to the procedure report that was given to you for any specific questions about what was found during the examination.  If the procedure report does not answer your questions, please call your gastroenterologist to clarify.  If you requested that your care partner not be given the details of your procedure findings, then the procedure report has been included in a sealed envelope for you to review at your convenience later.  YOU SHOULD EXPECT: Some feelings of bloating in the abdomen. Passage of more gas than usual.  Walking can help get rid of the air that was put into your GI tract during the procedure and reduce the bloating. If you had a lower endoscopy (such as a colonoscopy or flexible sigmoidoscopy) you may notice spotting of blood in your stool or on the toilet paper. If you underwent a bowel prep for your procedure, you may not have a normal bowel movement for a few days.  Please Note:  You might notice some irritation and congestion in your nose or some drainage.  This is from the oxygen used during your procedure.  There is no need for concern and it should clear up in a day or so.  SYMPTOMS TO REPORT IMMEDIATELY:   Following lower endoscopy (colonoscopy or flexible sigmoidoscopy):  Excessive amounts of blood in the stool  Significant tenderness or worsening of abdominal pains  Swelling of the abdomen that is new, acute  Fever of 100F or higher    For urgent or emergent issues, a gastroenterologist can be reached at any hour by calling [phone].   DIET:  We do recommend a small meal at first, but then you may proceed to your regular diet.  Drink  plenty of fluids but you should avoid alcoholic beverages for 24 hours.  ACTIVITY:  You should plan to take it easy for the rest of today and you should NOT DRIVE or use heavy machinery until tomorrow (because of the sedation medicines used during the test).    FOLLOW UP: Our staff will call the number listed on your records the next business day following your procedure to check on you and address any questions or concerns that you may have regarding the information given to you following your procedure. If we do not reach you, we will leave a message.  However, if you are feeling well and you are not experiencing any problems, there is no need to return our call.  We will assume that you have returned to your regular daily activities without incident.  If any biopsies were taken you will be contacted by phone or by letter within the next 1-3 weeks.  Please call us at [phone] if you have not heard about the biopsies in 3 weeks.    SIGNATURES/CONFIDENTIALITY: You and/or your care partner have signed paperwork which will be entered into your electronic medical record.  These signatures attest to the fact that that the information above on your After Visit Summary has been reviewed and is understood.  Full responsibility of the confidentiality of this discharge information lies with you and/or your care-partner.

## 2018-08-11 ENCOUNTER — Telehealth: Payer: Self-pay

## 2018-08-11 NOTE — Telephone Encounter (Signed)
  Follow up Call-  Call back number 08/10/2018  Post procedure Call Back phone  # [phone]  Permission to leave phone message Yes  Some recent data might be hidden     Patient questions:  Do you have a fever, pain , or abdominal swelling? No. Pain Score  0 *  Have you tolerated food without any problems? Yes.    Have you been able to return to your normal activities? Yes.    Do you have any questions about your discharge instructions: Diet   No. Medications  No. Follow up visit  No.  Do you have questions or concerns about your Care? No.  Actions: * If pain score is 4 or above: No action needed, pain <4.

## 2018-08-18 ENCOUNTER — Encounter: Payer: Self-pay | Admitting: Internal Medicine

## 2018-09-09 ENCOUNTER — Ambulatory Visit (INDEPENDENT_AMBULATORY_CARE_PROVIDER_SITE_OTHER): Payer: 59

## 2018-09-09 DIAGNOSIS — I639 Cerebral infarction, unspecified: Secondary | ICD-10-CM | POA: Diagnosis not present

## 2018-09-10 NOTE — Progress Notes (Signed)
[facility] Summary Report / Loop Recorder 

## 2018-09-27 LAB — CUP PACEART REMOTE DEVICE CHECK
Implantable Pulse Generator Implant Date: 20170829
MDC IDC SESS DTM: 20191115204033

## 2018-10-03 LAB — CUP PACEART REMOTE DEVICE CHECK
Date Time Interrogation Session: 20191218224104
MDC IDC PG IMPLANT DT: 20170829

## 2018-10-12 ENCOUNTER — Ambulatory Visit (INDEPENDENT_AMBULATORY_CARE_PROVIDER_SITE_OTHER): Payer: 59

## 2018-10-12 DIAGNOSIS — I639 Cerebral infarction, unspecified: Secondary | ICD-10-CM

## 2018-10-13 LAB — CUP PACEART REMOTE DEVICE CHECK
Date Time Interrogation Session: 20200120230930
MDC IDC PG IMPLANT DT: 20170829

## 2018-10-13 NOTE — Progress Notes (Signed)
[facility] Summary Report / Loop Recorder 

## 2018-11-16 ENCOUNTER — Ambulatory Visit (INDEPENDENT_AMBULATORY_CARE_PROVIDER_SITE_OTHER): Payer: 59 | Admitting: *Deleted

## 2018-11-16 DIAGNOSIS — I639 Cerebral infarction, unspecified: Secondary | ICD-10-CM | POA: Diagnosis not present

## 2018-11-17 LAB — CUP PACEART REMOTE DEVICE CHECK
Date Time Interrogation Session: 20200222230954
MDC IDC PG IMPLANT DT: 20170829

## 2018-11-24 NOTE — Progress Notes (Signed)
[facility] Summary Report / Loop Recorder 

## 2018-11-30 ENCOUNTER — Telehealth: Payer: Self-pay

## 2018-11-30 NOTE — Telephone Encounter (Signed)
I asked the pt to send a manual transmission with her home remote monitor.

## 2018-12-01 NOTE — Telephone Encounter (Signed)
Full report received and reviewed. 12 "AF" episodes all show SR w/PACs and occasional PVCs, not true AF. "Symptom" episode from 11/06/16 shows NSR.

## 2018-12-04 ENCOUNTER — Other Ambulatory Visit: Payer: Self-pay | Admitting: Internal Medicine

## 2018-12-17 ENCOUNTER — Other Ambulatory Visit: Payer: Self-pay

## 2018-12-17 ENCOUNTER — Ambulatory Visit (INDEPENDENT_AMBULATORY_CARE_PROVIDER_SITE_OTHER): Payer: 59 | Admitting: *Deleted

## 2018-12-17 DIAGNOSIS — I639 Cerebral infarction, unspecified: Secondary | ICD-10-CM | POA: Diagnosis not present

## 2018-12-19 LAB — CUP PACEART REMOTE DEVICE CHECK
Date Time Interrogation Session: 20200326233929
Implantable Pulse Generator Implant Date: 20170829

## 2018-12-22 NOTE — Progress Notes (Signed)
[facility] Summary Report / Loop Recorder 

## 2018-12-25 ENCOUNTER — Ambulatory Visit: Payer: 59 | Admitting: Internal Medicine

## 2019-01-11 ENCOUNTER — Telehealth: Payer: Self-pay

## 2019-01-11 NOTE — Telephone Encounter (Signed)
I requested that the pt send a manual transmission with her home monitor however she received the error code 3230. I gave her the number to [place] support to get further help.

## 2019-01-18 NOTE — Telephone Encounter (Signed)
This is the 2nd attempt to get the pt to send a manual transmission with her home monitor. The pt picked the phone up but hung up the phone. I was unable to speak or leave a message with the pt.

## 2019-01-19 ENCOUNTER — Other Ambulatory Visit: Payer: Self-pay

## 2019-01-19 ENCOUNTER — Ambulatory Visit (INDEPENDENT_AMBULATORY_CARE_PROVIDER_SITE_OTHER): Payer: 59 | Admitting: *Deleted

## 2019-01-19 DIAGNOSIS — I639 Cerebral infarction, unspecified: Secondary | ICD-10-CM | POA: Diagnosis not present

## 2019-01-19 NOTE — Telephone Encounter (Signed)
This was the 3rd attempt to reach the pt to ask for a manual transmission with her home monitor. I am sending her a letter and on May 78IO I will send a certified letter.

## 2019-01-20 LAB — CUP PACEART REMOTE DEVICE CHECK
Date Time Interrogation Session: 20200429000708
Implantable Pulse Generator Implant Date: 20170829

## 2019-01-27 NOTE — Progress Notes (Signed)
[facility] Summary Report / Loop Recorder 

## 2019-02-02 NOTE — Telephone Encounter (Signed)
Transmission received 01/31/2019 

## 2019-02-22 ENCOUNTER — Ambulatory Visit (INDEPENDENT_AMBULATORY_CARE_PROVIDER_SITE_OTHER): Payer: 59 | Admitting: *Deleted

## 2019-02-22 DIAGNOSIS — I639 Cerebral infarction, unspecified: Secondary | ICD-10-CM

## 2019-02-22 LAB — CUP PACEART REMOTE DEVICE CHECK
Date Time Interrogation Session: 20200601003729
Implantable Pulse Generator Implant Date: 20170829

## 2019-03-01 NOTE — Progress Notes (Signed)
[facility] Summary Report / Loop Recorder 

## 2019-03-10 ENCOUNTER — Other Ambulatory Visit: Payer: Self-pay | Admitting: Internal Medicine

## 2019-03-29 ENCOUNTER — Ambulatory Visit (INDEPENDENT_AMBULATORY_CARE_PROVIDER_SITE_OTHER): Payer: 59 | Admitting: *Deleted

## 2019-03-29 DIAGNOSIS — I639 Cerebral infarction, unspecified: Secondary | ICD-10-CM

## 2019-03-29 LAB — CUP PACEART REMOTE DEVICE CHECK
Date Time Interrogation Session: 20200704003817
Implantable Pulse Generator Implant Date: 20170829

## 2019-03-31 ENCOUNTER — Encounter: Payer: Self-pay | Admitting: Internal Medicine

## 2019-03-31 ENCOUNTER — Other Ambulatory Visit: Payer: Self-pay

## 2019-03-31 ENCOUNTER — Other Ambulatory Visit (INDEPENDENT_AMBULATORY_CARE_PROVIDER_SITE_OTHER): Payer: 59

## 2019-03-31 ENCOUNTER — Telehealth: Payer: Self-pay | Admitting: Internal Medicine

## 2019-03-31 ENCOUNTER — Ambulatory Visit (INDEPENDENT_AMBULATORY_CARE_PROVIDER_SITE_OTHER): Payer: 59 | Admitting: Internal Medicine

## 2019-03-31 VITALS — BP 122/78 | HR 78 | Temp 98.2°F | Ht 63.0 in | Wt 130.0 lb

## 2019-03-31 DIAGNOSIS — Z0000 Encounter for general adult medical examination without abnormal findings: Secondary | ICD-10-CM | POA: Diagnosis not present

## 2019-03-31 DIAGNOSIS — E559 Vitamin D deficiency, unspecified: Secondary | ICD-10-CM

## 2019-03-31 DIAGNOSIS — E611 Iron deficiency: Secondary | ICD-10-CM

## 2019-03-31 DIAGNOSIS — E538 Deficiency of other specified B group vitamins: Secondary | ICD-10-CM

## 2019-03-31 DIAGNOSIS — R739 Hyperglycemia, unspecified: Secondary | ICD-10-CM

## 2019-03-31 LAB — BASIC METABOLIC PANEL
BUN: 12 mg/dL (ref 6–23)
CO2: 23 mEq/L (ref 19–32)
Calcium: 9.2 mg/dL (ref 8.4–10.5)
Chloride: 105 mEq/L (ref 96–112)
Creatinine, Ser: 0.96 mg/dL (ref 0.40–1.20)
GFR: 69.91 mL/min (ref >60.00)
Glucose, Bld: 98 mg/dL (ref 70–99)
Potassium: 3.9 mEq/L (ref 3.5–5.1)
Sodium: 138 mEq/L (ref 135–145)

## 2019-03-31 LAB — CBC WITH DIFFERENTIAL/PLATELET
Basophils Absolute: 0 10*3/uL (ref 0.0–0.1)
Basophils Relative: 0.6 % (ref 0.0–3.0)
Eosinophils Absolute: 0.1 10*3/uL (ref 0.0–0.7)
Eosinophils Relative: 1 % (ref 0.0–5.0)
HCT: 41.6 % (ref 36.0–46.0)
Hemoglobin: 14 g/dL (ref 12.0–15.0)
Lymphocytes Relative: 37.8 % (ref 12.0–46.0)
Lymphs Abs: 2.6 10*3/uL (ref 0.7–4.0)
MCHC: 33.6 g/dL (ref 30.0–36.0)
MCV: 90.4 fl (ref 78.0–100.0)
Monocytes Absolute: 0.6 10*3/uL (ref 0.1–1.0)
Monocytes Relative: 8.2 % (ref 3.0–12.0)
Neutro Abs: 3.6 10*3/uL (ref 1.4–7.7)
Neutrophils Relative %: 52.4 % (ref 43.0–77.0)
Platelets: 150 10*3/uL (ref 150.0–400.0)
RBC: 4.6 Mil/uL (ref 3.87–5.11)
RDW: 14.2 % (ref 11.5–15.5)
WBC: 6.8 10*3/uL (ref 4.0–10.5)

## 2019-03-31 LAB — LIPID PANEL
Cholesterol: 209 mg/dL — ABNORMAL HIGH (ref 0–200)
HDL: 58.5 mg/dL (ref >39.00)
LDL Cholesterol: 118 mg/dL — ABNORMAL HIGH (ref 0–99)
NonHDL: 150.02
Total CHOL/HDL Ratio: 4
Triglycerides: 159 mg/dL — ABNORMAL HIGH (ref 0.0–149.0)
VLDL: 31.8 mg/dL (ref 0.0–40.0)

## 2019-03-31 LAB — URINALYSIS, ROUTINE W REFLEX MICROSCOPIC
Bilirubin Urine: NEGATIVE
Hgb urine dipstick: NEGATIVE
Ketones, ur: NEGATIVE
Leukocytes,Ua: NEGATIVE
Nitrite: NEGATIVE
RBC / HPF: NONE SEEN (ref 0–?)
Specific Gravity, Urine: 1.02 (ref 1.000–1.030)
Total Protein, Urine: NEGATIVE
Urine Glucose: NEGATIVE
Urobilinogen, UA: 1 (ref 0.0–1.0)
pH: 6 (ref 5.0–8.0)

## 2019-03-31 LAB — HEPATIC FUNCTION PANEL
ALT: 8 U/L (ref 0–35)
AST: 13 U/L (ref 0–37)
Albumin: 3.9 g/dL (ref 3.5–5.2)
Alkaline Phosphatase: 74 U/L (ref 39–117)
Bilirubin, Direct: 0.1 mg/dL (ref 0.0–0.3)
Total Bilirubin: 0.3 mg/dL (ref 0.2–1.2)
Total Protein: 6.6 g/dL (ref 6.0–8.3)

## 2019-03-31 LAB — IBC PANEL
Iron: 73 ug/dL (ref 42–145)
Saturation Ratios: 20.1 % (ref 20.0–50.0)
Transferrin: 259 mg/dL (ref 212.0–360.0)

## 2019-03-31 LAB — TSH: TSH: 2.26 u[IU]/mL (ref 0.35–4.50)

## 2019-03-31 LAB — VITAMIN B12: Vitamin B-12: 161 pg/mL — ABNORMAL LOW (ref 211–911)

## 2019-03-31 LAB — HEMOGLOBIN A1C: Hgb A1c MFr Bld: 5.5 % (ref 4.6–6.5)

## 2019-03-31 LAB — VITAMIN D 25 HYDROXY (VIT D DEFICIENCY, FRACTURES): VITD: 9.52 ng/mL — ABNORMAL LOW (ref 30.00–100.00)

## 2019-03-31 MED ORDER — VITAMIN D (ERGOCALCIFEROL) 1.25 MG (50000 UNIT) PO CAPS: 50000.0000 [IU] | ORAL_CAPSULE | ORAL | 0 refills | Status: DC

## 2019-03-31 NOTE — Progress Notes (Signed)
Subjective:    Patient ID: Elizabeth [name], female    DOB: 04-13-1951, 68 y.o.   MRN: [id number]  HPI  Here for wellness and f/u;  Overall doing ok;  Pt denies Chest pain, worsening SOB, DOE, wheezing, orthopnea, PND, worsening LE edema, palpitations, dizziness or syncope.  Pt denies neurological change such as new headache, facial or extremity weakness.  Pt denies polydipsia, polyuria, or low sugar symptoms. Pt states overall good compliance with treatment and medications, good tolerability, and has been trying to follow appropriate diet.  Pt denies worsening depressive symptoms, suicidal ideation or panic. No fever, night sweats, wt loss, loss of appetite, or other constitutional symptoms.  Pt states good ability with ADL's, has low fall risk, home safety reviewed and adequate, no other significant changes in hearing or vision, and only occasionally active with exercise.  No new complaints Wt Readings from Last 3 Encounters:  03/31/19 130 lb (59 kg)  08/10/18 134 lb (60.8 kg)  07/27/18 134 lb (60.8 kg)   Past Medical History:  Diagnosis Date  . Abrasion or friction burn of foot and toe(s), with infection   . Allergy    soy  . Anxiety   . 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 Degenerative joint disease    Generalized  . Hyperlipidemia   . Hypertension   . Myocardial infarction ([place])    x3, last MI was 6 years ago per pt/Stent placed  . Osteoporosis 06/05/2017  . Sickle cell trait ([place])   . Stroke ([place]) 2017  . Tobacco abuse April, 2011   Continued   Past Surgical History:  Procedure Laterality Date  . ANGIOPLASTY  2006   x3 stents  . COLONOSCOPY  05/20/2011  . DILATION AND CURETTAGE OF UTERUS    . EP IMPLANTABLE DEVICE N/A 05/21/2016   Procedure: Loop Recorder Insertion;  Surgeon: Thompson Grayer, MD;  Location: [facility];  Service: Cardiovascular;  Laterality: N/A;  . FOOT  SURGERY  2000  . LITHOTRIPSY  2008   left kidney stone and also ureteral stent placed  . POLYPECTOMY    . TEE WITHOUT CARDIOVERSION N/A 05/21/2016   Procedure: TRANSESOPHAGEAL ECHOCARDIOGRAM (TEE);  Surgeon: Lelon Perla, MD;  Location: [facility];  Service: Cardiovascular;  Laterality: N/A;    reports that she has been smoking cigarettes. She has been smoking about 0.50 packs per day. She has never used smokeless tobacco. She reports that she does not drink alcohol or use drugs. family history includes Coronary artery disease in her father and mother; Heart disease in her brother; Stroke in her mother. Allergies  Allergen Reactions  . Shrimp [Shellfish Allergy] Nausea And Vomiting  . Adhesive [Tape] Other (See Comments)    Unknown  . Soy Allergy Nausea And Vomiting   Current Outpatient Medications on File Prior to Visit  Medication Sig Dispense Refill  . alendronate (FOSAMAX) 70 MG tablet Take 1 tablet (70 mg total) by mouth every 7 (seven) days. Take with a full glass of water on an empty stomach. 12 tablet 3  . amLODipine (NORVASC) 2.5 MG tablet TAKE 1 TABLET BY MOUTH EVERY DAY 90 tablet 1  . atorvastatin (LIPITOR) 80 MG tablet TAKE 1 TABLET (80 MG TOTAL) BY MOUTH DAILY AT 6 PM. 90 tablet 1  . [facility] ASPIRIN EC 325 MG EC tablet TAKE 1 TABLET BY MOUTH EVERY DAY 90 tablet  1  . escitalopram (LEXAPRO) 10 MG tablet TAKE 1 TABLET BY MOUTH EVERY DAY 90 tablet 1  . ezetimibe (ZETIA) 10 MG tablet TAKE 1 TABLET BY MOUTH EVERY DAY 90 tablet 1  . metoprolol tartrate (LOPRESSOR) 50 MG tablet TAKE 1 TABLET BY MOUTH TWICE A DAY 180 tablet 1  . nitroGLYCERIN (NITROLINGUAL) 0.4 MG/SPRAY spray PLACE 1 SPRAY UNDER THE TONGUE EVERY 5 (FIVE) MINUTES AS NEEDED. MAY REPEAT UP TO 3 DOSES. 12 g 0  . telmisartan (MICARDIS) 80 MG tablet Please specify directions, refills and quantity 30 tablet 1   No current facility-administered medications on file prior to visit.    Review of Systems Constitutional:  Negative for other unusual diaphoresis, sweats, appetite or weight changes HENT: Negative for other worsening hearing loss, ear pain, facial swelling, mouth sores or neck stiffness.   Eyes: Negative for other worsening pain, redness or other visual disturbance.  Respiratory: Negative for other stridor or swelling Cardiovascular: Negative for other palpitations or other chest pain  Gastrointestinal: Negative for worsening diarrhea or loose stools, blood in stool, distention or other pain Genitourinary: Negative for hematuria, flank pain or other change in urine volume.  Musculoskeletal: Negative for myalgias or other joint swelling.  Skin: Negative for other color change, or other wound or worsening drainage.  Neurological: Negative for other syncope or numbness. Hematological: Negative for other adenopathy or swelling Psychiatric/Behavioral: Negative for hallucinations, other worsening agitation, SI, self-injury, or new decreased concentration All other system neg per pt    Objective:   Physical Exam BP 122/78   Pulse 78   Temp 98.2 F (36.8 C) (Oral)   Ht 5\' 3"  (1.6 m)   Wt 130 lb (59 kg)   SpO2 98%   BMI 23.03 kg/m  VS noted,  Constitutional: Pt is oriented to person, place, and time. Appears well-developed and well-nourished, in no significant distress and comfortable Head: Normocephalic and atraumatic  Eyes: Conjunctivae and EOM are normal. Pupils are equal, round, and reactive to light Right Ear: External ear normal without discharge Left Ear: External ear normal without discharge Nose: Nose without discharge or deformity Mouth/Throat: Oropharynx is without other ulcerations and moist  Neck: Normal range of motion. Neck supple. No JVD present. No tracheal deviation present or significant neck LA or mass Cardiovascular: Normal rate, regular rhythm, normal heart sounds and intact distal pulses.   Pulmonary/Chest: WOB normal and breath sounds without rales or wheezing   Abdominal: Soft. Bowel sounds are normal. NT. No HSM  Musculoskeletal: Normal range of motion. Exhibits no edema Lymphadenopathy: Has no other cervical adenopathy.  Neurological: Pt is alert and oriented to person, place, and time. Pt has normal reflexes. No cranial nerve deficit. Motor grossly intact, Gait intact Skin: Skin is warm and dry. No rash noted or new ulcerations Psychiatric:  Has normal mood and affect. Behavior is normal without agitation No other exam findings Lab Results  Component Value Date   WBC 6.8 03/31/2019   HGB 14.0 03/31/2019   HCT 41.6 03/31/2019   PLT 150.0 Repeated and verified X2. 03/31/2019   GLUCOSE 98 03/31/2019   CHOL 209 (H) 03/31/2019   TRIG 159.0 (H) 03/31/2019   HDL 58.50 03/31/2019   LDLDIRECT 164.8 01/17/2010   LDLCALC 118 (H) 03/31/2019   ALT 8 03/31/2019   AST 13 03/31/2019   NA 138 03/31/2019   K 3.9 03/31/2019   CL 105 03/31/2019   CREATININE 0.96 03/31/2019   BUN 12 03/31/2019   CO2 23 03/31/2019  TSH 2.26 03/31/2019   INR 0.95 05/19/2016   HGBA1C 5.5 03/31/2019      Assessment & Plan:

## 2019-03-31 NOTE — Telephone Encounter (Signed)
Done erx 

## 2019-03-31 NOTE — Patient Instructions (Signed)

## 2019-04-01 ENCOUNTER — Telehealth: Payer: Self-pay

## 2019-04-01 NOTE — Telephone Encounter (Signed)
-----   Message from Biagio Borg, MD sent at 03/31/2019 12:38 PM EDT ----- Left message on MyChart, pt to cont same tx except  The test results show that your current treatment is OK, except the LDL cholesterol is mildly elevated, the Vitamin D level is low, and the Vitamin B12 level is low as well.    We need to: 1)  Remember to take the generic lipitor every day, and follow a lower cholesterol diet 2)  Please take Vitamin D 50000 units weekly for 12 weeks, then plan to change to OTC Vitamin D3 at 2000 units per day, indefinitely. 3)  Start monthly B12 shots here in the office until we wee you again next visit  Jocelyn Nold to please inform pt, I will do rx, and please arrange for monthly b12 shots

## 2019-04-01 NOTE — Telephone Encounter (Signed)
Pt has been informed of results and expressed understanding. B12 injection scheduled.

## 2019-04-02 ENCOUNTER — Ambulatory Visit (INDEPENDENT_AMBULATORY_CARE_PROVIDER_SITE_OTHER): Payer: 59

## 2019-04-02 ENCOUNTER — Other Ambulatory Visit: Payer: Self-pay

## 2019-04-02 DIAGNOSIS — E538 Deficiency of other specified B group vitamins: Secondary | ICD-10-CM

## 2019-04-02 MED ORDER — CYANOCOBALAMIN 1000 MCG/ML IJ SOLN
1000.0000 ug | Freq: Once | INTRAMUSCULAR | Status: AC
  Administered 2019-04-02: 1000 ug via INTRAMUSCULAR

## 2019-04-02 NOTE — Progress Notes (Signed)
Medical screening examination/treatment/procedure(s) were performed by non-physician practitioner and as supervising physician I was immediately available for consultation/collaboration. I agree with above. James John, MD   

## 2019-04-03 ENCOUNTER — Encounter: Payer: Self-pay | Admitting: Internal Medicine

## 2019-04-03 NOTE — Assessment & Plan Note (Signed)
stable overall by history and exam, recent data reviewed with pt, and pt to continue medical treatment as before,  to f/u any worsening symptoms or concerns  

## 2019-04-03 NOTE — Assessment & Plan Note (Signed)

## 2019-04-05 NOTE — Progress Notes (Signed)
[facility] Summary Report / Loop Recorder 

## 2019-04-28 ENCOUNTER — Ambulatory Visit (INDEPENDENT_AMBULATORY_CARE_PROVIDER_SITE_OTHER): Payer: 59 | Admitting: *Deleted

## 2019-04-28 DIAGNOSIS — I639 Cerebral infarction, unspecified: Secondary | ICD-10-CM | POA: Diagnosis not present

## 2019-04-29 LAB — CUP PACEART REMOTE DEVICE CHECK
Date Time Interrogation Session: 20200806013906
Implantable Pulse Generator Implant Date: 20170829

## 2019-04-30 ENCOUNTER — Other Ambulatory Visit: Payer: Self-pay

## 2019-04-30 ENCOUNTER — Ambulatory Visit (INDEPENDENT_AMBULATORY_CARE_PROVIDER_SITE_OTHER): Payer: 59

## 2019-04-30 DIAGNOSIS — E538 Deficiency of other specified B group vitamins: Secondary | ICD-10-CM

## 2019-04-30 MED ORDER — CYANOCOBALAMIN 1000 MCG/ML IJ SOLN
1000.0000 ug | Freq: Once | INTRAMUSCULAR | Status: AC
  Administered 2019-04-30: 15:00:00 1000 ug via INTRAMUSCULAR

## 2019-04-30 NOTE — Progress Notes (Signed)
Medical screening examination/treatment/procedure(s) were performed by non-physician practitioner and as supervising physician I was immediately available for consultation/collaboration. I agree with above. Linzy Darling, MD   

## 2019-05-04 NOTE — Progress Notes (Signed)
[facility] Summary Report / Loop Recorder 

## 2019-06-01 ENCOUNTER — Ambulatory Visit (INDEPENDENT_AMBULATORY_CARE_PROVIDER_SITE_OTHER): Payer: 59 | Admitting: *Deleted

## 2019-06-01 DIAGNOSIS — I639 Cerebral infarction, unspecified: Secondary | ICD-10-CM

## 2019-06-01 LAB — CUP PACEART REMOTE DEVICE CHECK
Date Time Interrogation Session: 20200908041013
Implantable Pulse Generator Implant Date: 20170829

## 2019-06-02 ENCOUNTER — Ambulatory Visit (INDEPENDENT_AMBULATORY_CARE_PROVIDER_SITE_OTHER): Payer: 59

## 2019-06-02 DIAGNOSIS — E538 Deficiency of other specified B group vitamins: Secondary | ICD-10-CM

## 2019-06-02 DIAGNOSIS — Z23 Encounter for immunization: Secondary | ICD-10-CM | POA: Diagnosis not present

## 2019-06-02 MED ORDER — CYANOCOBALAMIN 1000 MCG/ML IJ SOLN
1000.0000 ug | Freq: Once | INTRAMUSCULAR | Status: AC
  Administered 2019-06-02: 1000 ug via INTRAMUSCULAR

## 2019-06-02 NOTE — Progress Notes (Signed)
Medical screening examination/treatment/procedure(s) were performed by non-physician practitioner and as supervising physician I was immediately available for consultation/collaboration. I agree with above. Dakwon Wenberg, MD   

## 2019-06-02 NOTE — Addendum Note (Signed)
Addended by: Ander Slade on: 06/02/2019 01:35 PM   Modules accepted: Orders

## 2019-06-16 NOTE — Progress Notes (Signed)
[facility] Summary Report / Loop Recorder 

## 2019-06-21 ENCOUNTER — Other Ambulatory Visit: Payer: Self-pay | Admitting: Internal Medicine

## 2019-07-02 ENCOUNTER — Ambulatory Visit (INDEPENDENT_AMBULATORY_CARE_PROVIDER_SITE_OTHER): Payer: 59

## 2019-07-02 DIAGNOSIS — E538 Deficiency of other specified B group vitamins: Secondary | ICD-10-CM | POA: Diagnosis not present

## 2019-07-02 MED ORDER — CYANOCOBALAMIN 1000 MCG/ML IJ SOLN
1000.0000 ug | Freq: Once | INTRAMUSCULAR | Status: AC
  Administered 2019-07-02: 09:00:00 1000 ug via INTRAMUSCULAR

## 2019-07-02 NOTE — Progress Notes (Signed)
Medical treatment/procedure(s) were performed by non-physician practitioner and as supervising physician I was immediately available for consultation/collaboration. I agree with above. Zeek Rostron A Herminia Warren, MD  

## 2019-07-05 ENCOUNTER — Ambulatory Visit (INDEPENDENT_AMBULATORY_CARE_PROVIDER_SITE_OTHER): Payer: 59 | Admitting: *Deleted

## 2019-07-05 DIAGNOSIS — I639 Cerebral infarction, unspecified: Secondary | ICD-10-CM

## 2019-07-05 LAB — CUP PACEART REMOTE DEVICE CHECK
Date Time Interrogation Session: 20201012160920
Implantable Pulse Generator Implant Date: 20170829

## 2019-07-14 NOTE — Progress Notes (Signed)
[facility] Summary Report / Loop Recorder 

## 2019-08-02 ENCOUNTER — Ambulatory Visit (INDEPENDENT_AMBULATORY_CARE_PROVIDER_SITE_OTHER): Payer: 59

## 2019-08-02 ENCOUNTER — Other Ambulatory Visit: Payer: Self-pay

## 2019-08-02 DIAGNOSIS — E538 Deficiency of other specified B group vitamins: Secondary | ICD-10-CM

## 2019-08-02 MED ORDER — CYANOCOBALAMIN 1000 MCG/ML IJ SOLN
1000.0000 ug | Freq: Once | INTRAMUSCULAR | Status: AC
  Administered 2019-08-02: 12:00:00 1000 ug via INTRAMUSCULAR

## 2019-08-02 NOTE — Progress Notes (Signed)
Medical screening examination/treatment/procedure(s) were performed by non-physician practitioner and as supervising physician I was immediately available for consultation/collaboration. I agree with above. Gearline Spilman, MD   

## 2019-08-07 LAB — CUP PACEART REMOTE DEVICE CHECK
Date Time Interrogation Session: 20201114182534
Implantable Pulse Generator Implant Date: 20170829

## 2019-08-09 ENCOUNTER — Ambulatory Visit (INDEPENDENT_AMBULATORY_CARE_PROVIDER_SITE_OTHER): Payer: 59 | Admitting: *Deleted

## 2019-08-09 DIAGNOSIS — I6340 Cerebral infarction due to embolism of unspecified cerebral artery: Secondary | ICD-10-CM

## 2019-09-01 ENCOUNTER — Ambulatory Visit (INDEPENDENT_AMBULATORY_CARE_PROVIDER_SITE_OTHER): Payer: 59 | Admitting: *Deleted

## 2019-09-01 ENCOUNTER — Telehealth: Payer: Self-pay | Admitting: *Deleted

## 2019-09-01 ENCOUNTER — Other Ambulatory Visit: Payer: Self-pay

## 2019-09-01 DIAGNOSIS — E538 Deficiency of other specified B group vitamins: Secondary | ICD-10-CM

## 2019-09-01 MED ORDER — CYANOCOBALAMIN 1000 MCG/ML IJ SOLN
1000.0000 ug | Freq: Once | INTRAMUSCULAR | Status: AC
  Administered 2019-09-01: 09:00:00 1000 ug via INTRAMUSCULAR

## 2019-09-01 NOTE — Telephone Encounter (Signed)
LINQ alert received for tachy episode on 08/31/19 at 13:03, 22sec duration. Patient reports she was outside raking leaves at that time and was asymptomatic. Requested manual transmission to try to see episode onset. Pt unable to send transmission due to error code. Unable to troubleshoot monitor. Gave [employer] tech services number for further assistance. Pt agrees to call today.

## 2019-09-01 NOTE — Telephone Encounter (Signed)
Follow Up  Patient is calling in to make the office aware that she is being mailed a new monitor for her device. The one she has is not working and will not allow her to send a transmission.

## 2019-09-01 NOTE — Progress Notes (Signed)
Medical screening examination/treatment/procedure(s) were performed by non-physician practitioner and as supervising physician I was immediately available for consultation/collaboration. I agree with above. James John, MD   

## 2019-09-03 NOTE — Progress Notes (Signed)
[facility] Summary Report / Loop Recorder 

## 2019-09-06 NOTE — Telephone Encounter (Signed)
Transmission received 09-06-2019

## 2019-09-07 NOTE — Telephone Encounter (Signed)
LINQ implanted for cryptogenic stroke, full tachy ECG routed to Dr. Rayann Heman for review.

## 2019-09-07 NOTE — Telephone Encounter (Signed)
Possibly atrial flutter, though difficult to tell. Only 22 seconds in duration.  Continue to monitor without changes at this time

## 2019-09-07 NOTE — Telephone Encounter (Signed)
Transmission reviewed and no other information available to observe onset of episode.

## 2019-09-09 ENCOUNTER — Ambulatory Visit (INDEPENDENT_AMBULATORY_CARE_PROVIDER_SITE_OTHER): Payer: 59 | Admitting: *Deleted

## 2019-09-09 DIAGNOSIS — I639 Cerebral infarction, unspecified: Secondary | ICD-10-CM | POA: Diagnosis not present

## 2019-09-09 LAB — CUP PACEART REMOTE DEVICE CHECK
Date Time Interrogation Session: 20201217133106
Implantable Pulse Generator Implant Date: 20170829

## 2019-09-16 ENCOUNTER — Other Ambulatory Visit: Payer: Self-pay | Admitting: Internal Medicine

## 2019-10-01 ENCOUNTER — Ambulatory Visit: Payer: 59 | Admitting: Internal Medicine

## 2019-10-09 NOTE — Progress Notes (Signed)
ILR Remote 

## 2019-10-11 ENCOUNTER — Encounter: Payer: Self-pay | Admitting: Internal Medicine

## 2019-10-11 ENCOUNTER — Other Ambulatory Visit: Payer: Self-pay

## 2019-10-11 ENCOUNTER — Ambulatory Visit (INDEPENDENT_AMBULATORY_CARE_PROVIDER_SITE_OTHER): Payer: 59 | Admitting: Internal Medicine

## 2019-10-11 ENCOUNTER — Other Ambulatory Visit (INDEPENDENT_AMBULATORY_CARE_PROVIDER_SITE_OTHER): Payer: 59

## 2019-10-11 VITALS — BP 126/82 | HR 75 | Temp 98.0°F | Ht 63.0 in | Wt 129.0 lb

## 2019-10-11 DIAGNOSIS — R739 Hyperglycemia, unspecified: Secondary | ICD-10-CM

## 2019-10-11 DIAGNOSIS — E538 Deficiency of other specified B group vitamins: Secondary | ICD-10-CM

## 2019-10-11 DIAGNOSIS — Z0000 Encounter for general adult medical examination without abnormal findings: Secondary | ICD-10-CM

## 2019-10-11 DIAGNOSIS — E782 Mixed hyperlipidemia: Secondary | ICD-10-CM

## 2019-10-11 DIAGNOSIS — Z1231 Encounter for screening mammogram for malignant neoplasm of breast: Secondary | ICD-10-CM | POA: Diagnosis not present

## 2019-10-11 DIAGNOSIS — E559 Vitamin D deficiency, unspecified: Secondary | ICD-10-CM | POA: Insufficient documentation

## 2019-10-11 MED ORDER — VITAMIN B-12 1000 MCG PO TABS: 1000.0000 ug | ORAL_TABLET | Freq: Every day | ORAL | 3 refills | Status: DC

## 2019-10-11 NOTE — Patient Instructions (Addendum)
You will be contacted regarding the referral for: mammogram  Ok to change the B12 shots to the pills - 1 per day  Please take OTC Vitamin D3 at 2000 units per day, indefinitely.  Please continue all other medications as before, and refills have been done if requested.  Please have the pharmacy call with any other refills you may need.  Please continue your efforts at being more active, low cholesterol diet, and weight control.  You are otherwise up to date with prevention measures today.  Please keep your appointments with your specialists as you may have planned  Please go to the LAB at the blood drawing area for the tests to be done - at the [facility] will be contacted by phone if any changes need to be made immediately.  Otherwise, you will receive a letter about your results with an explanation, but please check with MyChart first.  Please remember to sign up for MyChart if you have not done so, as this will be important to you in the future with finding out test results, communicating by private email, and scheduling acute appointments online when needed.  Please make an Appointment to return in 6 months, or sooner if needed

## 2019-10-11 NOTE — Assessment & Plan Note (Signed)
For f/u lab 

## 2019-10-11 NOTE — Assessment & Plan Note (Signed)
For a1c with labs

## 2019-10-11 NOTE — Assessment & Plan Note (Signed)

## 2019-10-11 NOTE — Assessment & Plan Note (Signed)
Goal < 70, for f'u lab

## 2019-10-11 NOTE — Progress Notes (Signed)
Subjective:    Patient ID: Elizabeth [name], female    DOB: 06/16/1951, 69 y.o.   MRN: [id number]   HPI  Here for wellness and f/u;  Overall doing ok;  Pt denies Chest pain, worsening SOB, DOE, wheezing, orthopnea, PND, worsening LE edema, palpitations, dizziness or syncope.  Pt denies neurological change such as new headache, facial or extremity weakness.  Pt denies polydipsia, polyuria, or low sugar symptoms. Pt states overall good compliance with treatment and medications, good tolerability, and has been trying to follow appropriate diet.  Pt denies worsening depressive symptoms, suicidal ideation or panic. No fever, night sweats, wt loss, loss of appetite, or other constitutional symptoms.  Pt states good ability with ADL's, has low fall risk, home safety reviewed and adequate, no other significant changes in hearing or vision, and only occasionally active with exercise. No new compalints Past Medical History:  Diagnosis Date  . Abrasion or friction burn of foot and toe(s), with infection   . Allergy    soy  . Anxiety   . 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 Degenerative joint disease    Generalized  . Hyperlipidemia   . Hypertension   . Myocardial infarction ([place])    x3, last MI was 6 years ago per pt/Stent placed  . Osteoporosis 06/05/2017  . Sickle cell trait ([place])   . Stroke ([place]) 2017  . Tobacco abuse April, 2011   Continued   Past Surgical History:  Procedure Laterality Date  . ANGIOPLASTY  2006   x3 stents  . COLONOSCOPY  05/20/2011  . DILATION AND CURETTAGE OF UTERUS    . EP IMPLANTABLE DEVICE N/A 05/21/2016   Procedure: Loop Recorder Insertion;  Surgeon: Thompson Grayer, MD;  Location: [place] [facility];  Service: Cardiovascular;  Laterality: N/A;  . FOOT SURGERY  2000  . LITHOTRIPSY  2008   left kidney stone and also ureteral stent placed  . POLYPECTOMY    . TEE WITHOUT  CARDIOVERSION N/A 05/21/2016   Procedure: TRANSESOPHAGEAL ECHOCARDIOGRAM (TEE);  Surgeon: Lelon Perla, MD;  Location: [facility] ENDOSCOPY;  Service: Cardiovascular;  Laterality: N/A;    reports that she has been smoking cigarettes. She has been smoking about 0.50 packs per day. She has never used smokeless tobacco. She reports that she does not drink alcohol or use drugs. family history includes Coronary artery disease in her father and mother; Heart disease in her brother; Stroke in her mother. Allergies  Allergen Reactions  . Shrimp [Shellfish Allergy] Nausea And Vomiting  . Adhesive [Tape] Other (See Comments)    Unknown  . Soy Allergy Nausea And Vomiting   Current Outpatient Medications on File Prior to Visit  Medication Sig Dispense Refill  . alendronate (FOSAMAX) 70 MG tablet Take 1 tablet (70 mg total) by mouth every 7 (seven) days. Take with a full glass of water on an empty stomach. 12 tablet 3  . amLODipine (NORVASC) 2.5 MG tablet TAKE 1 TABLET BY MOUTH EVERY DAY 90 tablet 1  . atorvastatin (LIPITOR) 80 MG tablet TAKE 1 TABLET (80 MG TOTAL) BY MOUTH DAILY AT 6 PM. 90 tablet 1  . [facility] ASPIRIN EC 325 MG EC tablet TAKE 1 TABLET BY MOUTH EVERY DAY 90 tablet 1  . escitalopram (LEXAPRO) 10 MG tablet TAKE 1 TABLET BY MOUTH EVERY DAY 90 tablet 1  . ezetimibe (ZETIA) 10 MG tablet TAKE  1 TABLET BY MOUTH EVERY DAY 90 tablet 1  . metoprolol tartrate (LOPRESSOR) 50 MG tablet TAKE 1 TABLET BY MOUTH TWICE A DAY 180 tablet 1  . nitroGLYCERIN (NITROLINGUAL) 0.4 MG/SPRAY spray PLACE 1 SPRAY UNDER THE TONGUE EVERY 5 (FIVE) MINUTES AS NEEDED. MAY REPEAT UP TO 3 DOSES. 12 g 0  . telmisartan (MICARDIS) 80 MG tablet Please specify directions, refills and quantity 30 tablet 1  . Vitamin D, Ergocalciferol, (DRISDOL) 1.25 MG (50000 UT) CAPS capsule Take 1 capsule (50,000 Units total) by mouth every 7 (seven) days. 12 capsule 0   No current facility-administered medications on file prior to visit.   Review of  Systems  Constitutional: Negative for other unusual diaphoresis or sweats HENT: Negative for ear discharge or swelling Eyes: Negative for other worsening visual disturbances Respiratory: Negative for stridor or other swelling  Gastrointestinal: Negative for worsening distension or other blood Genitourinary: Negative for retention or other urinary change Musculoskeletal: Negative for other MSK pain or swelling Skin: Negative for color change or other new lesions Neurological: Negative for worsening tremors and other numbness  Psychiatric/Behavioral: Negative for worsening agitation or other fatigue All otherwise neg per pt     Objective:   Physical Exam BP 126/82   Pulse 75   Temp 98 F (36.7 C) (Oral)   Ht 5\' 3"  (1.6 m)   Wt 129 lb (58.5 kg)   SpO2 98%   BMI 22.85 kg/m  VS noted,  Constitutional: Pt appears in NAD HENT: Head: NCAT.  Right Ear: External ear normal.  Left Ear: External ear normal.  Eyes: . Pupils are equal, round, and reactive to light. Conjunctivae and EOM are normal Nose: without d/c or deformity Neck: Neck supple. Gross normal ROM Cardiovascular: Normal rate and regular rhythm.   Pulmonary/Chest: Effort normal and breath sounds without rales or wheezing.  Abd:  Soft, NT, ND, + BS, no organomegaly Neurological: Pt is alert. At baseline orientation, motor grossly intact Skin: Skin is warm. No rashes, other new lesions, no LE edema Psychiatric: Pt behavior is normal without agitation  All otherwise neg per pt   Lab Results  Component Value Date   WBC 6.8 03/31/2019   HGB 14.0 03/31/2019   HCT 41.6 03/31/2019   PLT 150.0 Repeated and verified X2. 03/31/2019   GLUCOSE 98 03/31/2019   CHOL 209 (H) 03/31/2019   TRIG 159.0 (H) 03/31/2019   HDL 58.50 03/31/2019   LDLDIRECT 164.8 01/17/2010   LDLCALC 118 (H) 03/31/2019   ALT 8 03/31/2019   AST 13 03/31/2019   NA 138 03/31/2019   K 3.9 03/31/2019   CL 105 03/31/2019   CREATININE 0.96 03/31/2019   BUN  12 03/31/2019   CO2 23 03/31/2019   TSH 2.26 03/31/2019   INR 0.95 05/19/2016   HGBA1C 5.5 03/31/2019      Assessment & Plan:

## 2019-10-12 ENCOUNTER — Ambulatory Visit (INDEPENDENT_AMBULATORY_CARE_PROVIDER_SITE_OTHER): Payer: 59 | Admitting: *Deleted

## 2019-10-12 ENCOUNTER — Other Ambulatory Visit: Payer: Self-pay | Admitting: Internal Medicine

## 2019-10-12 DIAGNOSIS — I639 Cerebral infarction, unspecified: Secondary | ICD-10-CM

## 2019-10-12 LAB — BASIC METABOLIC PANEL
BUN: 9 mg/dL (ref 6–23)
CO2: 28 mEq/L (ref 19–32)
Calcium: 10 mg/dL (ref 8.4–10.5)
Chloride: 105 mEq/L (ref 96–112)
Creatinine, Ser: 0.92 mg/dL (ref 0.40–1.20)
GFR: 73.31 mL/min (ref >60.00)
Glucose, Bld: 87 mg/dL (ref 70–99)
Potassium: 4.8 mEq/L (ref 3.5–5.1)
Sodium: 139 mEq/L (ref 135–145)

## 2019-10-12 LAB — CUP PACEART REMOTE DEVICE CHECK
Date Time Interrogation Session: 20210119134111
Implantable Pulse Generator Implant Date: 20170829

## 2019-10-12 LAB — URINALYSIS, ROUTINE W REFLEX MICROSCOPIC
Bilirubin Urine: NEGATIVE
Hgb urine dipstick: NEGATIVE
Ketones, ur: NEGATIVE
Leukocytes,Ua: NEGATIVE
Nitrite: NEGATIVE
RBC / HPF: NONE SEEN (ref 0–?)
Specific Gravity, Urine: 1.015 (ref 1.000–1.030)
Total Protein, Urine: NEGATIVE
Urine Glucose: NEGATIVE
Urobilinogen, UA: 0.2 (ref 0.0–1.0)
pH: 6 (ref 5.0–8.0)

## 2019-10-12 LAB — HEPATIC FUNCTION PANEL
ALT: 11 U/L (ref 0–35)
AST: 16 U/L (ref 0–37)
Albumin: 3.8 g/dL (ref 3.5–5.2)
Alkaline Phosphatase: 86 U/L (ref 39–117)
Bilirubin, Direct: 0 mg/dL (ref 0.0–0.3)
Total Bilirubin: 0.3 mg/dL (ref 0.2–1.2)
Total Protein: 6.7 g/dL (ref 6.0–8.3)

## 2019-10-12 LAB — HEMOGLOBIN A1C: Hgb A1c MFr Bld: 5.5 % (ref 4.6–6.5)

## 2019-10-12 LAB — CBC WITH DIFFERENTIAL/PLATELET
Basophils Absolute: 0.1 10*3/uL (ref 0.0–0.1)
Basophils Relative: 0.9 % (ref 0.0–3.0)
Eosinophils Absolute: 0.1 10*3/uL (ref 0.0–0.7)
Eosinophils Relative: 1.7 % (ref 0.0–5.0)
HCT: 41.3 % (ref 36.0–46.0)
Hemoglobin: 13.9 g/dL (ref 12.0–15.0)
Lymphocytes Relative: 46 % (ref 12.0–46.0)
Lymphs Abs: 4 10*3/uL (ref 0.7–4.0)
MCHC: 33.6 g/dL (ref 30.0–36.0)
MCV: 91 fl (ref 78.0–100.0)
Monocytes Absolute: 0.8 10*3/uL (ref 0.1–1.0)
Monocytes Relative: 8.8 % (ref 3.0–12.0)
Neutro Abs: 3.7 10*3/uL (ref 1.4–7.7)
Neutrophils Relative %: 42.6 % — ABNORMAL LOW (ref 43.0–77.0)
Platelets: 150 10*3/uL (ref 150.0–400.0)
RBC: 4.54 Mil/uL (ref 3.87–5.11)
RDW: 14.4 % (ref 11.5–15.5)
WBC: 8.7 10*3/uL (ref 4.0–10.5)

## 2019-10-12 LAB — LIPID PANEL
Cholesterol: 184 mg/dL (ref 0–200)
HDL: 57.2 mg/dL (ref >39.00)
LDL Cholesterol: 106 mg/dL — ABNORMAL HIGH (ref 0–99)
NonHDL: 127.17
Total CHOL/HDL Ratio: 3
Triglycerides: 107 mg/dL (ref 0.0–149.0)
VLDL: 21.4 mg/dL (ref 0.0–40.0)

## 2019-10-12 LAB — TSH: TSH: 3.69 u[IU]/mL (ref 0.35–4.50)

## 2019-10-12 LAB — VITAMIN D 25 HYDROXY (VIT D DEFICIENCY, FRACTURES): VITD: 21.29 ng/mL — ABNORMAL LOW (ref 30.00–100.00)

## 2019-10-12 LAB — VITAMIN B12: Vitamin B-12: 340 pg/mL (ref 211–911)

## 2019-10-12 MED ORDER — VITAMIN D (ERGOCALCIFEROL) 1.25 MG (50000 UNIT) PO CAPS: 50000.0000 [IU] | ORAL_CAPSULE | ORAL | 0 refills | Status: DC

## 2019-10-14 ENCOUNTER — Other Ambulatory Visit: Payer: Self-pay | Admitting: Internal Medicine

## 2019-11-15 ENCOUNTER — Ambulatory Visit (INDEPENDENT_AMBULATORY_CARE_PROVIDER_SITE_OTHER): Payer: 59 | Admitting: *Deleted

## 2019-11-15 DIAGNOSIS — I639 Cerebral infarction, unspecified: Secondary | ICD-10-CM | POA: Diagnosis not present

## 2019-11-15 LAB — CUP PACEART REMOTE DEVICE CHECK
Date Time Interrogation Session: 20210221235527
Implantable Pulse Generator Implant Date: 20170829

## 2019-11-15 NOTE — Progress Notes (Signed)
ILR Remote 

## 2019-12-16 ENCOUNTER — Ambulatory Visit (INDEPENDENT_AMBULATORY_CARE_PROVIDER_SITE_OTHER): Payer: 59 | Admitting: *Deleted

## 2019-12-16 DIAGNOSIS — I639 Cerebral infarction, unspecified: Secondary | ICD-10-CM | POA: Diagnosis not present

## 2019-12-16 LAB — CUP PACEART REMOTE DEVICE CHECK
Date Time Interrogation Session: 20210325005656
Implantable Pulse Generator Implant Date: 20170829

## 2019-12-16 NOTE — Progress Notes (Signed)
ILR Remote 

## 2020-01-03 ENCOUNTER — Other Ambulatory Visit: Payer: Self-pay | Admitting: Internal Medicine

## 2020-01-17 LAB — CUP PACEART REMOTE DEVICE CHECK
Date Time Interrogation Session: 20210425010405
Implantable Pulse Generator Implant Date: 20170829

## 2020-01-18 ENCOUNTER — Ambulatory Visit (INDEPENDENT_AMBULATORY_CARE_PROVIDER_SITE_OTHER): Payer: 59 | Admitting: *Deleted

## 2020-01-18 DIAGNOSIS — I639 Cerebral infarction, unspecified: Secondary | ICD-10-CM | POA: Diagnosis not present

## 2020-01-19 NOTE — Progress Notes (Signed)
ILR Remote 

## 2020-02-16 ENCOUNTER — Ambulatory Visit (INDEPENDENT_AMBULATORY_CARE_PROVIDER_SITE_OTHER): Payer: 59 | Admitting: *Deleted

## 2020-02-16 DIAGNOSIS — I639 Cerebral infarction, unspecified: Secondary | ICD-10-CM

## 2020-02-16 LAB — CUP PACEART REMOTE DEVICE CHECK
Date Time Interrogation Session: 20210526012938
Implantable Pulse Generator Implant Date: 20170829

## 2020-02-17 NOTE — Progress Notes (Signed)
[facility] Summary Report / Loop Recorder 

## 2020-02-17 NOTE — Addendum Note (Signed)
Addended by: Douglass Rivers D on: 02/17/2020 04:13 PM   Modules accepted: Level of Service

## 2020-02-29 ENCOUNTER — Telehealth: Payer: Self-pay | Admitting: *Deleted

## 2020-02-29 NOTE — Telephone Encounter (Signed)
LINQ alert received for RRT as of 02/27/20. No true AF noted since implant.  Spoke with patient. She declines explant aware to call in the future if she changes her mind. Unenrolled from [place], return kit ordered to home address on file. Pt denies any questions or concerns at this time.

## 2020-03-31 ENCOUNTER — Encounter: Payer: 59 | Admitting: Internal Medicine

## 2020-04-13 ENCOUNTER — Ambulatory Visit: Payer: 59 | Admitting: Internal Medicine

## 2020-04-23 ENCOUNTER — Other Ambulatory Visit: Payer: Self-pay | Admitting: Internal Medicine

## 2020-04-23 NOTE — Telephone Encounter (Signed)
Please refill as per office routine med refill policy (all routine meds refilled for 3 mo or monthly per pt preference up to one year from last visit, then month to month grace period for 3 mo, then further med refills will have to be denied)  

## 2020-04-25 ENCOUNTER — Other Ambulatory Visit: Payer: Self-pay | Admitting: Internal Medicine

## 2020-04-25 NOTE — Telephone Encounter (Signed)
Please refill as per office routine med refill policy (all routine meds refilled for 3 mo or monthly per pt preference up to one year from last visit, then month to month grace period for 3 mo, then further med refills will have to be denied)  

## 2020-07-05 ENCOUNTER — Encounter: Payer: Self-pay | Admitting: Internal Medicine

## 2020-07-05 ENCOUNTER — Other Ambulatory Visit: Payer: Self-pay

## 2020-07-05 ENCOUNTER — Ambulatory Visit (INDEPENDENT_AMBULATORY_CARE_PROVIDER_SITE_OTHER): Payer: 59 | Admitting: Internal Medicine

## 2020-07-05 VITALS — BP 150/84 | HR 70 | Temp 98.3°F | Ht 63.0 in | Wt 125.0 lb

## 2020-07-05 DIAGNOSIS — I10 Essential (primary) hypertension: Secondary | ICD-10-CM | POA: Diagnosis not present

## 2020-07-05 DIAGNOSIS — E559 Vitamin D deficiency, unspecified: Secondary | ICD-10-CM | POA: Diagnosis not present

## 2020-07-05 DIAGNOSIS — Z8673 Personal history of transient ischemic attack (TIA), and cerebral infarction without residual deficits: Secondary | ICD-10-CM | POA: Diagnosis not present

## 2020-07-05 DIAGNOSIS — E782 Mixed hyperlipidemia: Secondary | ICD-10-CM

## 2020-07-05 DIAGNOSIS — E538 Deficiency of other specified B group vitamins: Secondary | ICD-10-CM

## 2020-07-05 DIAGNOSIS — R739 Hyperglycemia, unspecified: Secondary | ICD-10-CM

## 2020-07-05 DIAGNOSIS — I639 Cerebral infarction, unspecified: Secondary | ICD-10-CM

## 2020-07-05 DIAGNOSIS — Z23 Encounter for immunization: Secondary | ICD-10-CM | POA: Diagnosis not present

## 2020-07-05 DIAGNOSIS — J4531 Mild persistent asthma with (acute) exacerbation: Secondary | ICD-10-CM | POA: Diagnosis not present

## 2020-07-05 LAB — CBC WITH DIFFERENTIAL/PLATELET
Basophils Absolute: 0.1 10*3/uL (ref 0.0–0.1)
Basophils Relative: 0.7 % (ref 0.0–3.0)
Eosinophils Absolute: 0.1 10*3/uL (ref 0.0–0.7)
Eosinophils Relative: 1.5 % (ref 0.0–5.0)
HCT: 42.2 % (ref 36.0–46.0)
Hemoglobin: 14.2 g/dL (ref 12.0–15.0)
Lymphocytes Relative: 42.1 % (ref 12.0–46.0)
Lymphs Abs: 3.2 10*3/uL (ref 0.7–4.0)
MCHC: 33.6 g/dL (ref 30.0–36.0)
MCV: 89.8 fl (ref 78.0–100.0)
Monocytes Absolute: 0.5 10*3/uL (ref 0.1–1.0)
Monocytes Relative: 7.2 % (ref 3.0–12.0)
Neutro Abs: 3.7 10*3/uL (ref 1.4–7.7)
Neutrophils Relative %: 48.5 % (ref 43.0–77.0)
Platelets: 130 10*3/uL — ABNORMAL LOW (ref 150.0–400.0)
RBC: 4.7 Mil/uL (ref 3.87–5.11)
RDW: 14.3 % (ref 11.5–15.5)
WBC: 7.6 10*3/uL (ref 4.0–10.5)

## 2020-07-05 LAB — VITAMIN D 25 HYDROXY (VIT D DEFICIENCY, FRACTURES): VITD: 19.03 ng/mL — ABNORMAL LOW (ref 30.00–100.00)

## 2020-07-05 LAB — LIPID PANEL
Cholesterol: 206 mg/dL — ABNORMAL HIGH (ref 0–200)
HDL: 60.6 mg/dL (ref >39.00)
LDL Cholesterol: 128 mg/dL — ABNORMAL HIGH (ref 0–99)
NonHDL: 145.76
Total CHOL/HDL Ratio: 3
Triglycerides: 91 mg/dL (ref 0.0–149.0)
VLDL: 18.2 mg/dL (ref 0.0–40.0)

## 2020-07-05 LAB — HEPATIC FUNCTION PANEL
ALT: 11 U/L (ref 0–35)
AST: 16 U/L (ref 0–37)
Albumin: 3.9 g/dL (ref 3.5–5.2)
Alkaline Phosphatase: 82 U/L (ref 39–117)
Bilirubin, Direct: 0 mg/dL (ref 0.0–0.3)
Total Bilirubin: 0.3 mg/dL (ref 0.2–1.2)
Total Protein: 6.7 g/dL (ref 6.0–8.3)

## 2020-07-05 LAB — BASIC METABOLIC PANEL
BUN: 14 mg/dL (ref 6–23)
CO2: 27 mEq/L (ref 19–32)
Calcium: 9.6 mg/dL (ref 8.4–10.5)
Chloride: 104 mEq/L (ref 96–112)
Creatinine, Ser: 1.05 mg/dL (ref 0.40–1.20)
GFR: 54 mL/min — ABNORMAL LOW (ref >60.00)
Glucose, Bld: 96 mg/dL (ref 70–99)
Potassium: 4.3 mEq/L (ref 3.5–5.1)
Sodium: 137 mEq/L (ref 135–145)

## 2020-07-05 LAB — VITAMIN B12: Vitamin B-12: 785 pg/mL (ref 211–911)

## 2020-07-05 LAB — HEMOGLOBIN A1C: Hgb A1c MFr Bld: 5.9 % (ref 4.6–6.5)

## 2020-07-05 LAB — TSH: TSH: 2.19 u[IU]/mL (ref 0.35–4.50)

## 2020-07-05 MED ORDER — ASPIRIN 325 MG PO TBEC
325.0000 mg | DELAYED_RELEASE_TABLET | Freq: Every day | ORAL | 11 refills | Status: DC
Start: 2020-07-05 — End: 2021-08-26

## 2020-07-05 MED ORDER — METHYLPREDNISOLONE ACETATE 80 MG/ML IJ SUSP
80.0000 mg | Freq: Once | INTRAMUSCULAR | Status: AC
  Administered 2020-07-05: 80 mg via INTRAMUSCULAR

## 2020-07-05 MED ORDER — ATORVASTATIN CALCIUM 80 MG PO TABS
80.0000 mg | ORAL_TABLET | Freq: Every day | ORAL | 3 refills | Status: DC
Start: 2020-07-05 — End: 2021-07-06

## 2020-07-05 MED ORDER — ALBUTEROL SULFATE HFA 108 (90 BASE) MCG/ACT IN AERS: 2.0000 | INHALATION_SPRAY | Freq: Four times a day (QID) | RESPIRATORY_TRACT | 11 refills | Status: DC | PRN

## 2020-07-05 NOTE — Patient Instructions (Addendum)
You had the flu shot today  Please check your BP at home, and call if your daily average over 10 days is more than 140/90  Please remember to have your COVID booster: When: Wednesday, July 05, 2020 at 03:00 PM EDT [place] Where: [place], [place] [address]  Please remember to call [facility] for the mammogram  Please take OTC Vitamin D3 at 2000 units per day, indefinitely.  You had the steroid shot today for the asthma acting up  Please take all new medication as prescribed  - the albuterol inhaler at home as needed for shortness of breath  Please continue all other medications as before, and refills have been done including the aspirin and the lipitor  Please have the pharmacy call with any other refills you may need.  Please continue your efforts at being more active, low cholesterol diet  Please keep your appointments with your specialists as you may have planned  Please go to the LAB at the blood drawing area for the tests to be done  You will be contacted by phone if any changes need to be made immediately.  Otherwise, you will receive a letter about your results with an explanation, but please check with MyChart first.  Please remember to sign up for MyChart if you have not done so, as this will be important to you in the future with finding out test results, communicating by private email, and scheduling acute appointments online when needed.  Please make an Appointment to return in 6 months, or sooner if needed

## 2020-07-05 NOTE — Progress Notes (Signed)
Subjective:    Patient ID: Elizabeth [name], female    DOB: 09-17-1951, 68 y.o.   MRN: [id number]  HPI  Here to f/u; overall doing ok,  Pt denies chest pain,, orthopnea, PND, increased LE swelling, palpitations, dizziness or syncope except in the last few days has had worsening wheezing, sob doe..  Pt denies new neurological symptoms such as new headache, or facial or extremity weakness or numbness.  Pt denies polydipsia, polyuria, or low sugar episode.  Pt states overall good compliance with meds, mostly trying to follow appropriate diet, with wt overall stable,  but little exercise however. Did not take meds due to fasting for blood work today.  Has not been getting refills lipitor for some reason.  Taking the B12 pill but not taking the Vit D.  Due for covid booster. 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, 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; groin hematoma and lower abdominal hematoma then   Degenerative joint disease    Generalized   Hyperlipidemia    Hypertension    Myocardial infarction ([place])    x3, last MI was 6 years ago per pt/Stent placed   Osteoporosis 06/05/2017   Sickle cell trait ([place])    Stroke ([place]) 2017   Tobacco abuse April, 2011   Continued   Past Surgical History:  Procedure Laterality Date   ANGIOPLASTY  2006   x3 stents   COLONOSCOPY  05/20/2011   DILATION AND CURETTAGE OF UTERUS     EP IMPLANTABLE DEVICE N/A 05/21/2016   Procedure: Loop Recorder Insertion;  Surgeon: Thompson Grayer, MD;  Location: [place] [facility];  Service: Cardiovascular;  Laterality: N/A;   FOOT SURGERY  2000   LITHOTRIPSY  2008   left kidney stone and also ureteral stent placed   POLYPECTOMY     TEE WITHOUT CARDIOVERSION N/A 05/21/2016   Procedure: TRANSESOPHAGEAL ECHOCARDIOGRAM (TEE);  Surgeon: Lelon Perla, MD;  Location: [facility]  ENDOSCOPY;  Service: Cardiovascular;  Laterality: N/A;    reports that she has been smoking cigarettes. She has been smoking about 0.50 packs per day. She has never used smokeless tobacco. She reports that she does not drink alcohol and does not use drugs. family history includes Coronary artery disease in her father and mother; Heart disease in her brother; Stroke in her mother. Allergies  Allergen Reactions   Shrimp [Shellfish Allergy] Nausea And Vomiting   Adhesive [Tape] Other (See Comments)    Unknown   Soy Allergy Nausea And Vomiting   Current Outpatient Medications on File Prior to Visit  Medication Sig Dispense Refill   alendronate (FOSAMAX) 70 MG tablet Take 1 tablet (70 mg total) by mouth every 7 (seven) days. Take with a full glass of water on an empty stomach. 12 tablet 3   amLODipine (NORVASC) 2.5 MG tablet TAKE 1 TABLET BY MOUTH EVERY DAY 90 tablet 1   escitalopram (LEXAPRO) 10 MG tablet TAKE 1 TABLET BY MOUTH EVERY DAY 90 tablet 1   ezetimibe (ZETIA) 10 MG tablet TAKE 1 TABLET BY MOUTH EVERY DAY 90 tablet 1   metoprolol tartrate (LOPRESSOR) 50 MG tablet TAKE 1 TABLET BY MOUTH TWICE A DAY 180 tablet 1   nitroGLYCERIN (NITROLINGUAL) 0.4 MG/SPRAY spray PLACE 1 SPRAY UNDER THE TONGUE EVERY 5 (FIVE) MINUTES AS  NEEDED. MAY REPEAT UP TO 3 DOSES. 12 g 0   telmisartan (MICARDIS) 80 MG tablet Please specify directions, refills and quantity 30 tablet 1   vitamin B-12 (CYANOCOBALAMIN) 1000 MCG tablet Take 1 tablet (1,000 mcg total) by mouth daily. 90 tablet 3   No current facility-administered medications on file prior to visit.   Review of Systems All otherwise neg per pt    Objective:   Physical Exam BP (!) 150/84 (BP Location: Left Arm, Patient Position: Sitting, Cuff Size: Large)    Pulse 70    Temp 98.3 F (36.8 C) (Oral)    Ht 5\' 3"  (1.6 m)    Wt 125 lb (56.7 kg)    SpO2 96%    BMI 22.14 kg/m  VS noted,  Constitutional: Pt appears in NAD HENT: Head: NCAT.  Right  Ear: External ear normal.  Left Ear: External ear normal.  Eyes: . Pupils are equal, round, and reactive to light. Conjunctivae and EOM are normal Nose: without d/c or deformity Neck: Neck supple. Gross normal ROM Cardiovascular: Normal rate and regular rhythm.   Pulmonary/Chest: Effort normal and breath sounds without rales but mild bilat wheezing.  Abd:  Soft, NT, ND, + BS, no organomegaly Neurological: Pt is alert. At baseline orientation, motor grossly intact Skin: Skin is warm. No rashes, other new lesions, no LE edema Psychiatric: Pt behavior is normal without agitation  All otherwise neg per pt  Lab Results  Component Value Date   WBC 7.6 07/05/2020   HGB 14.2 07/05/2020   HCT 42.2 07/05/2020   PLT 130.0 Repeated and verified X2. (L) 07/05/2020   GLUCOSE 96 07/05/2020   CHOL 206 (H) 07/05/2020   TRIG 91.0 07/05/2020   HDL 60.60 07/05/2020   LDLDIRECT 164.8 01/17/2010   LDLCALC 128 (H) 07/05/2020   ALT 11 07/05/2020   AST 16 07/05/2020   NA 137 07/05/2020   K 4.3 07/05/2020   CL 104 07/05/2020   CREATININE 1.05 07/05/2020   BUN 14 07/05/2020   CO2 27 07/05/2020   TSH 2.19 07/05/2020   INR 0.95 05/19/2016   HGBA1C 5.9 07/05/2020      Assessment & Plan:

## 2020-07-05 NOTE — Assessment & Plan Note (Addendum)
Ok to take meds in am despite fasting, also to check bp daily for 10 days and let us know average

## 2020-07-05 NOTE — Assessment & Plan Note (Signed)
Encouraged to restart the asa, statin

## 2020-07-05 NOTE — Assessment & Plan Note (Addendum)
Mild, for depomedrol IM 80, and albuterol HFA prn,  to f/u any worsening symptoms or concerns  I spent 41 minutes in preparing to see the patient by review of recent labs, imaging and procedures, obtaining and reviewing separately obtained history, communicating with the patient and family or caregiver, ordering medications, tests or procedures, and documenting clinical information in the EHR including the differential Dx, treatment, and any further evaluation and other management of hx of asthma exacerbation, hx of cva, b12 and d deficiency, htn hyperglycemia, hld,

## 2020-07-12 ENCOUNTER — Encounter: Payer: Self-pay | Admitting: Internal Medicine

## 2020-07-12 NOTE — Assessment & Plan Note (Signed)
stable overall by history and exam, recent data reviewed with pt, and pt to continue medical treatment as before,  to f/u any worsening symptoms or concerns  

## 2020-07-12 NOTE — Assessment & Plan Note (Signed)
For oral replacement 

## 2020-07-12 NOTE — Assessment & Plan Note (Signed)
For 2000 u qd

## 2021-01-04 ENCOUNTER — Ambulatory Visit: Payer: 59 | Admitting: Internal Medicine

## 2021-01-04 DIAGNOSIS — Z0289 Encounter for other administrative examinations: Secondary | ICD-10-CM

## 2021-07-06 ENCOUNTER — Encounter: Payer: Self-pay | Admitting: Internal Medicine

## 2021-07-06 ENCOUNTER — Other Ambulatory Visit: Payer: Self-pay

## 2021-07-06 ENCOUNTER — Ambulatory Visit (INDEPENDENT_AMBULATORY_CARE_PROVIDER_SITE_OTHER): Payer: 59 | Admitting: Internal Medicine

## 2021-07-06 VITALS — BP 118/78 | HR 70 | Temp 98.0°F | Ht 63.0 in | Wt 123.8 lb

## 2021-07-06 DIAGNOSIS — Z1211 Encounter for screening for malignant neoplasm of colon: Secondary | ICD-10-CM | POA: Diagnosis not present

## 2021-07-06 DIAGNOSIS — I10 Essential (primary) hypertension: Secondary | ICD-10-CM

## 2021-07-06 DIAGNOSIS — E559 Vitamin D deficiency, unspecified: Secondary | ICD-10-CM

## 2021-07-06 DIAGNOSIS — Z23 Encounter for immunization: Secondary | ICD-10-CM

## 2021-07-06 DIAGNOSIS — Z720 Tobacco use: Secondary | ICD-10-CM

## 2021-07-06 DIAGNOSIS — R739 Hyperglycemia, unspecified: Secondary | ICD-10-CM

## 2021-07-06 DIAGNOSIS — E538 Deficiency of other specified B group vitamins: Secondary | ICD-10-CM

## 2021-07-06 DIAGNOSIS — Z0001 Encounter for general adult medical examination with abnormal findings: Secondary | ICD-10-CM

## 2021-07-06 DIAGNOSIS — E782 Mixed hyperlipidemia: Secondary | ICD-10-CM | POA: Diagnosis not present

## 2021-07-06 LAB — URINALYSIS, ROUTINE W REFLEX MICROSCOPIC
Bilirubin Urine: NEGATIVE
Ketones, ur: NEGATIVE
Leukocytes,Ua: NEGATIVE
Nitrite: NEGATIVE
Specific Gravity, Urine: 1.01 (ref 1.000–1.030)
Total Protein, Urine: NEGATIVE
Urine Glucose: NEGATIVE
Urobilinogen, UA: 0.2 (ref 0.0–1.0)
WBC, UA: NONE SEEN (ref 0–?)
pH: 6.5 (ref 5.0–8.0)

## 2021-07-06 LAB — CBC WITH DIFFERENTIAL/PLATELET
Basophils Absolute: 0 10*3/uL (ref 0.0–0.1)
Basophils Relative: 0.6 % (ref 0.0–3.0)
Eosinophils Absolute: 0.1 10*3/uL (ref 0.0–0.7)
Eosinophils Relative: 1.4 % (ref 0.0–5.0)
HCT: 42.2 % (ref 36.0–46.0)
Hemoglobin: 14.1 g/dL (ref 12.0–15.0)
Lymphocytes Relative: 44.7 % (ref 12.0–46.0)
Lymphs Abs: 3.4 10*3/uL (ref 0.7–4.0)
MCHC: 33.3 g/dL (ref 30.0–36.0)
MCV: 90.2 fl (ref 78.0–100.0)
Monocytes Absolute: 0.6 10*3/uL (ref 0.1–1.0)
Monocytes Relative: 8.5 % (ref 3.0–12.0)
Neutro Abs: 3.4 10*3/uL (ref 1.4–7.7)
Neutrophils Relative %: 44.8 % (ref 43.0–77.0)
Platelets: 139 10*3/uL — ABNORMAL LOW (ref 150.0–400.0)
RBC: 4.68 Mil/uL (ref 3.87–5.11)
RDW: 14.8 % (ref 11.5–15.5)
WBC: 7.5 10*3/uL (ref 4.0–10.5)

## 2021-07-06 LAB — BASIC METABOLIC PANEL
BUN: 12 mg/dL (ref 6–23)
CO2: 26 mEq/L (ref 19–32)
Calcium: 9.9 mg/dL (ref 8.4–10.5)
Chloride: 103 mEq/L (ref 96–112)
Creatinine, Ser: 0.91 mg/dL (ref 0.40–1.20)
GFR: 63.97 mL/min (ref >60.00)
Glucose, Bld: 91 mg/dL (ref 70–99)
Potassium: 4.2 mEq/L (ref 3.5–5.1)
Sodium: 136 mEq/L (ref 135–145)

## 2021-07-06 LAB — LIPID PANEL
Cholesterol: 224 mg/dL — ABNORMAL HIGH (ref 0–200)
HDL: 59.8 mg/dL (ref >39.00)
LDL Cholesterol: 143 mg/dL — ABNORMAL HIGH (ref 0–99)
NonHDL: 163.94
Total CHOL/HDL Ratio: 4
Triglycerides: 107 mg/dL (ref 0.0–149.0)
VLDL: 21.4 mg/dL (ref 0.0–40.0)

## 2021-07-06 LAB — HEPATIC FUNCTION PANEL
ALT: 7 U/L (ref 0–35)
AST: 15 U/L (ref 0–37)
Albumin: 4 g/dL (ref 3.5–5.2)
Alkaline Phosphatase: 89 U/L (ref 39–117)
Bilirubin, Direct: 0.1 mg/dL (ref 0.0–0.3)
Total Bilirubin: 0.5 mg/dL (ref 0.2–1.2)
Total Protein: 6.9 g/dL (ref 6.0–8.3)

## 2021-07-06 LAB — VITAMIN D 25 HYDROXY (VIT D DEFICIENCY, FRACTURES): VITD: 19.65 ng/mL — ABNORMAL LOW (ref 30.00–100.00)

## 2021-07-06 LAB — HEMOGLOBIN A1C: Hgb A1c MFr Bld: 5.7 % (ref 4.6–6.5)

## 2021-07-06 LAB — TSH: TSH: 2.4 u[IU]/mL (ref 0.35–5.50)

## 2021-07-06 LAB — VITAMIN B12: Vitamin B-12: 508 pg/mL (ref 211–911)

## 2021-07-06 MED ORDER — EZETIMIBE 10 MG PO TABS: 10.0000 mg | ORAL_TABLET | Freq: Every day | ORAL | 3 refills | Status: DC

## 2021-07-06 MED ORDER — TELMISARTAN 80 MG PO TABS: ORAL_TABLET | ORAL | 3 refills | Status: DC

## 2021-07-06 MED ORDER — ALENDRONATE SODIUM 70 MG PO TABS: 70.0000 mg | ORAL_TABLET | ORAL | 3 refills | Status: DC

## 2021-07-06 MED ORDER — METOPROLOL TARTRATE 50 MG PO TABS: 50.0000 mg | ORAL_TABLET | Freq: Two times a day (BID) | ORAL | 3 refills | Status: DC

## 2021-07-06 MED ORDER — VITAMIN B-12 1000 MCG PO TABS: 1000.0000 ug | ORAL_TABLET | Freq: Every day | ORAL | 3 refills | Status: AC

## 2021-07-06 MED ORDER — NITROGLYCERIN 0.4 MG/SPRAY TL SOLN: 1.0000 | 0 refills | Status: DC | PRN

## 2021-07-06 MED ORDER — AMLODIPINE BESYLATE 2.5 MG PO TABS: 2.5000 mg | ORAL_TABLET | Freq: Every day | ORAL | 3 refills | Status: DC

## 2021-07-06 MED ORDER — ALBUTEROL SULFATE HFA 108 (90 BASE) MCG/ACT IN AERS: 2.0000 | INHALATION_SPRAY | Freq: Four times a day (QID) | RESPIRATORY_TRACT | 11 refills | Status: DC | PRN

## 2021-07-06 MED ORDER — ATORVASTATIN CALCIUM 80 MG PO TABS: 80.0000 mg | ORAL_TABLET | Freq: Every day | ORAL | 3 refills | Status: DC

## 2021-07-06 MED ORDER — ESCITALOPRAM OXALATE 10 MG PO TABS: 10.0000 mg | ORAL_TABLET | Freq: Every day | ORAL | 3 refills | Status: DC

## 2021-07-06 NOTE — Assessment & Plan Note (Signed)
Last vitamin D Lab Results  Component Value Date   VD25OH 19.03 (L) 07/05/2020   Low, to start oral replacement

## 2021-07-06 NOTE — Patient Instructions (Addendum)
You had the flu shot today  Please stop smoking and consider the yearly CT scan of the chest program to screen for lung cancer  You will be contacted regarding the referral for: colonoscopy  Please take OTC Vitamin D3 at 2000 units per day, indefinitely, as well as the B12 1000 mcg per day (OTC)  Please continue all other medications as before, and refills have been done if requested.  Please have the pharmacy call with any other refills you may need.  Please continue your efforts at being more active, low cholesterol diet, and weight control.  You are otherwise up to date with prevention measures today.  Please keep your appointments with your specialists as you may have planned

## 2021-07-06 NOTE — Progress Notes (Signed)
Patient ID: Elizabeth [name], female   DOB: 13-Oct-1950, 70 y.o.   MRN: [id number]         Chief Complaint:: wellness exam and low b12, low Vit d, hld,       HPI:  Elizabeth [name] is a 69 y.o. female here for wellness exam; due for flu shot, and colonoscopy, o/w up to date                        Also not taking the liipitor every evening as sometimes she forgets.  Pt denies chest pain, increased sob or doe, wheezing, orthopnea, PND, increased LE swelling, palpitations, dizziness or syncope.   Pt denies polydipsia, polyuria, or new focal neuro s/s.    Pt denies fever, wt loss, night sweats, loss of appetite, or other constitutional symptoms  Not taking Vit D or B12.  No overt bleeding or bruising  Wt Readings from Last 3 Encounters:  07/06/21 123 lb 12.8 oz (56.2 kg)  07/05/20 125 lb (56.7 kg)  10/11/19 129 lb (58.5 kg)   BP Readings from Last 3 Encounters:  07/06/21 118/78  07/05/20 (!) 150/84  10/11/19 126/82   Immunization History  Administered Date(s) Administered   Fluad Quad(high Dose 65+) 06/02/2019, 07/05/2020, 07/06/2021   Influenza, High Dose Seasonal PF 05/22/2017, 06/24/2018   PFIZER(Purple Top)SARS-COV-2 Vaccination 10/28/2019, 11/18/2019, 07/05/2020   Pneumococcal Conjugate-13 06/05/2017   Pneumococcal Polysaccharide-23 12/19/2017   Tdap 04/02/2011   Zoster, Live 10/21/2011   There are no preventive care reminders to display for this patient.     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, 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; groin hematoma and lower abdominal hematoma then   Degenerative joint disease    Generalized   Hyperlipidemia    Hypertension    Myocardial infarction ([place])    x3, last MI was 6 years ago per pt/Stent placed   Osteoporosis 06/05/2017   Sickle cell trait ([place])    Stroke ([place]) 2017   Tobacco abuse April, 2011    Continued   Past Surgical History:  Procedure Laterality Date   ANGIOPLASTY  2006   x3 stents   COLONOSCOPY  05/20/2011   DILATION AND CURETTAGE OF UTERUS     EP IMPLANTABLE DEVICE N/A 05/21/2016   Procedure: Loop Recorder Insertion;  Surgeon: Thompson Grayer, MD;  Location: [place] [facility];  Service: Cardiovascular;  Laterality: N/A;   FOOT SURGERY  2000   LITHOTRIPSY  2008   left kidney stone and also ureteral stent placed   POLYPECTOMY     TEE WITHOUT CARDIOVERSION N/A 05/21/2016   Procedure: TRANSESOPHAGEAL ECHOCARDIOGRAM (TEE);  Surgeon: Lelon Perla, MD;  Location: [facility] ENDOSCOPY;  Service: Cardiovascular;  Laterality: N/A;    reports that she has been smoking cigarettes. She has been smoking an average of .5 packs per day. She has never used smokeless tobacco. She reports that she does not drink alcohol and does not use drugs. family history includes Coronary artery disease in her father and mother; Heart disease in her brother; Stroke in her mother. Allergies  Allergen Reactions   Shrimp [Shellfish Allergy] Nausea And Vomiting   Adhesive [Tape] Other (See Comments)    Unknown   Soy Allergy Nausea And Vomiting   Current Outpatient  Medications on File Prior to Visit  Medication Sig Dispense Refill   aspirin ([facility] ASPIRIN EC) 325 MG EC tablet Take 1 tablet (325 mg total) by mouth daily. 90 tablet 11   No current facility-administered medications on file prior to visit.        ROS:  All others reviewed and negative.  Objective        PE:  BP 118/78 (BP Location: Left Arm, Patient Position: Sitting, Cuff Size: Normal)   Pulse 70   Temp 98 F (36.7 C) (Oral)   Ht 5\' 3"  (1.6 m)   Wt 123 lb 12.8 oz (56.2 kg)   SpO2 97%   BMI 21.93 kg/m                 Constitutional: Pt appears in NAD               HENT: Head: NCAT.                Right Ear: External ear normal.                 Left Ear: External ear normal.                Eyes: . Pupils are equal, round, and  reactive to light. Conjunctivae and EOM are normal               Nose: without d/c or deformity               Neck: Neck supple. Gross normal ROM               Cardiovascular: Normal rate and regular rhythm.                 Pulmonary/Chest: Effort normal and breath sounds without rales or wheezing.                Abd:  Soft, NT, ND, + BS, no organomegaly               Neurological: Pt is alert. At baseline orientation, motor grossly intact               Skin: Skin is warm. No rashes, no other new lesions, LE edema - none               Psychiatric: Pt behavior is normal without agitation   Micro: none  Cardiac tracings I have personally interpreted today:  none  Pertinent Radiological findings (summarize): none   Lab Results  Component Value Date   WBC 7.5 07/06/2021   HGB 14.1 07/06/2021   HCT 42.2 07/06/2021   PLT 139.0 Repeated and verified X2. (L) 07/06/2021   GLUCOSE 91 07/06/2021   CHOL 224 (H) 07/06/2021   TRIG 107.0 07/06/2021   HDL 59.80 07/06/2021   LDLDIRECT 164.8 01/17/2010   LDLCALC 143 (H) 07/06/2021   ALT 7 07/06/2021   AST 15 07/06/2021   NA 136 07/06/2021   K 4.2 07/06/2021   CL 103 07/06/2021   CREATININE 0.91 07/06/2021   BUN 12 07/06/2021   CO2 26 07/06/2021   TSH 2.40 07/06/2021   INR 0.95 05/19/2016   HGBA1C 5.7 07/06/2021   Assessment/Plan:  Elizabeth [name] is a 70 y.o. Black or African American [2] female with  has a past medical history of Abrasion or friction burn of foot and toe(s), with infection, Allergy, Anxiety, CAD (coronary artery disease), Degenerative joint disease, Hyperlipidemia, Hypertension, Myocardial infarction [facility]), Osteoporosis (06/05/2017),  Sickle cell trait [facility]), Stroke [facility]) (2017), and Tobacco abuse (April, 2011).  Vitamin D deficiency Last vitamin D Lab Results  Component Value Date   VD25OH 19.03 (L) 07/05/2020   Low, to start oral replacement   Encounter for well adult exam with abnormal findings Age and sex  appropriate education and counseling updated with regular exercise and diet Referrals for preventative services - for colonoscopy Immunizations addressed - due for flu shot Smoking counseling  - none needed Evidence for depression or other mood disorder - none significant Most recent labs reviewed. I have personally reviewed and have noted: 1) the patient's medical and social history 2) The patient's current medications and supplements 3) The patient's height, weight, and BMI have been recorded in the chart   Benign essential HTN BP Readings from Last 3 Encounters:  07/06/21 118/78  07/05/20 (!) 150/84  10/11/19 126/82   Stable, pt to continue medical treatment norvasc, lopressor   Mixed hyperlipidemia Lab Results  Component Value Date   LDLCALC 143 (H) 07/06/2021   uncontrolled, pt to restart current statin lipitor 80 to take daily   Hyperglycemia Lab Results  Component Value Date   HGBA1C 5.7 07/06/2021   Stable, pt to continue current medical treatment  - diet   B12 deficiency Lab Results  Component Value Date   [id number] 07/06/2021   Low, to stat oral replacement - b12 1000 mcg qd   Tobacco abuse Counseled to quit, pt not ready  Followup: No follow-ups on file.  Cathlean Cower, MD 07/08/2021 8:16 PM [facility]

## 2021-07-08 NOTE — Assessment & Plan Note (Signed)
Lab Results  Component Value Date   LDLCALC 143 (H) 07/06/2021   uncontrolled, pt to restart current statin lipitor 80 to take daily

## 2021-07-08 NOTE — Assessment & Plan Note (Signed)
Lab Results  Component Value Date   [id number] 07/06/2021   Low, to stat oral replacement - b12 1000 mcg qd

## 2021-07-08 NOTE — Assessment & Plan Note (Addendum)
Age and sex appropriate education and counseling updated with regular exercise and diet Referrals for preventative services - for colonoscopy Immunizations addressed - due for flu shot Smoking counseling  - counseled to quit, pt not ready Evidence for depression or other mood disorder - none significant Most recent labs reviewed. I have personally reviewed and have noted: 1) the patient's medical and social history 2) The patient's current medications and supplements 3) The patient's height, weight, and BMI have been recorded in the chart

## 2021-07-08 NOTE — Assessment & Plan Note (Signed)
Lab Results  Component Value Date   HGBA1C 5.7 07/06/2021   Stable, pt to continue current medical treatment  - diet

## 2021-07-08 NOTE — Assessment & Plan Note (Signed)
BP Readings from Last 3 Encounters:  07/06/21 118/78  07/05/20 (!) 150/84  10/11/19 126/82   Stable, pt to continue medical treatment norvasc, lopressor

## 2021-07-08 NOTE — Assessment & Plan Note (Signed)
Counseled to quit, pt not ready 

## 2021-08-10 ENCOUNTER — Encounter: Payer: Self-pay | Admitting: Internal Medicine

## 2021-08-26 ENCOUNTER — Other Ambulatory Visit: Payer: Self-pay | Admitting: Internal Medicine

## 2022-05-15 ENCOUNTER — Ambulatory Visit (INDEPENDENT_AMBULATORY_CARE_PROVIDER_SITE_OTHER): Payer: Medicare Other

## 2022-05-15 ENCOUNTER — Other Ambulatory Visit: Payer: Self-pay | Admitting: Internal Medicine

## 2022-05-15 ENCOUNTER — Ambulatory Visit (INDEPENDENT_AMBULATORY_CARE_PROVIDER_SITE_OTHER): Payer: Medicare Other | Admitting: Internal Medicine

## 2022-05-15 VITALS — BP 120/72 | HR 77 | Temp 98.2°F | Ht 63.0 in | Wt 126.0 lb

## 2022-05-15 DIAGNOSIS — R079 Chest pain, unspecified: Secondary | ICD-10-CM

## 2022-05-15 DIAGNOSIS — E559 Vitamin D deficiency, unspecified: Secondary | ICD-10-CM

## 2022-05-15 DIAGNOSIS — J90 Pleural effusion, not elsewhere classified: Secondary | ICD-10-CM | POA: Diagnosis not present

## 2022-05-15 DIAGNOSIS — M19011 Primary osteoarthritis, right shoulder: Secondary | ICD-10-CM | POA: Diagnosis not present

## 2022-05-15 DIAGNOSIS — Z720 Tobacco use: Secondary | ICD-10-CM | POA: Diagnosis not present

## 2022-05-15 DIAGNOSIS — E538 Deficiency of other specified B group vitamins: Secondary | ICD-10-CM

## 2022-05-15 DIAGNOSIS — E782 Mixed hyperlipidemia: Secondary | ICD-10-CM

## 2022-05-15 DIAGNOSIS — R3129 Other microscopic hematuria: Secondary | ICD-10-CM

## 2022-05-15 DIAGNOSIS — M25511 Pain in right shoulder: Secondary | ICD-10-CM

## 2022-05-15 LAB — BASIC METABOLIC PANEL
BUN: 11 mg/dL (ref 6–23)
CO2: 25 mEq/L (ref 19–32)
Calcium: 9.9 mg/dL (ref 8.4–10.5)
Chloride: 102 mEq/L (ref 96–112)
Creatinine, Ser: 0.88 mg/dL (ref 0.40–1.20)
GFR: 66.2 mL/min (ref >60.00)
Glucose, Bld: 98 mg/dL (ref 70–99)
Potassium: 3.9 mEq/L (ref 3.5–5.1)
Sodium: 137 mEq/L (ref 135–145)

## 2022-05-15 LAB — VITAMIN B12: Vitamin B-12: 320 pg/mL (ref 211–911)

## 2022-05-15 LAB — LIPID PANEL
Cholesterol: 222 mg/dL — ABNORMAL HIGH (ref 0–200)
HDL: 51.3 mg/dL (ref >39.00)
LDL Cholesterol: 151 mg/dL — ABNORMAL HIGH (ref 0–99)
NonHDL: 171.09
Total CHOL/HDL Ratio: 4
Triglycerides: 101 mg/dL (ref 0.0–149.0)
VLDL: 20.2 mg/dL (ref 0.0–40.0)

## 2022-05-15 LAB — CBC WITH DIFFERENTIAL/PLATELET
Basophils Absolute: 0 10*3/uL (ref 0.0–0.1)
Basophils Relative: 0.4 % (ref 0.0–3.0)
Eosinophils Absolute: 0.1 10*3/uL (ref 0.0–0.7)
Eosinophils Relative: 1.4 % (ref 0.0–5.0)
HCT: 41.6 % (ref 36.0–46.0)
Hemoglobin: 13.7 g/dL (ref 12.0–15.0)
Lymphocytes Relative: 36.6 % (ref 12.0–46.0)
Lymphs Abs: 3 10*3/uL (ref 0.7–4.0)
MCHC: 33 g/dL (ref 30.0–36.0)
MCV: 91.9 fl (ref 78.0–100.0)
Monocytes Absolute: 0.9 10*3/uL (ref 0.1–1.0)
Monocytes Relative: 10.3 % (ref 3.0–12.0)
Neutro Abs: 4.2 10*3/uL (ref 1.4–7.7)
Neutrophils Relative %: 51.3 % (ref 43.0–77.0)
Platelets: 143 10*3/uL — ABNORMAL LOW (ref 150.0–400.0)
RBC: 4.53 Mil/uL (ref 3.87–5.11)
RDW: 15.5 % (ref 11.5–15.5)
WBC: 8.3 10*3/uL (ref 4.0–10.5)

## 2022-05-15 LAB — HEPATIC FUNCTION PANEL
ALT: 12 U/L (ref 0–35)
AST: 14 U/L (ref 0–37)
Albumin: 4 g/dL (ref 3.5–5.2)
Alkaline Phosphatase: 87 U/L (ref 39–117)
Bilirubin, Direct: 0.1 mg/dL (ref 0.0–0.3)
Total Bilirubin: 0.2 mg/dL (ref 0.2–1.2)
Total Protein: 1.1 g/dL — ABNORMAL LOW (ref 6.0–8.3)

## 2022-05-15 LAB — URINALYSIS, ROUTINE W REFLEX MICROSCOPIC
Bilirubin Urine: NEGATIVE
Ketones, ur: NEGATIVE
Leukocytes,Ua: NEGATIVE
Nitrite: NEGATIVE
Specific Gravity, Urine: 1.015 (ref 1.000–1.030)
Total Protein, Urine: NEGATIVE
Urine Glucose: NEGATIVE
Urobilinogen, UA: 1 (ref 0.0–1.0)
pH: 6.5 (ref 5.0–8.0)

## 2022-05-15 LAB — VITAMIN D 25 HYDROXY (VIT D DEFICIENCY, FRACTURES): VITD: 17.88 ng/mL — ABNORMAL LOW (ref 30.00–100.00)

## 2022-05-15 LAB — TSH: TSH: 2.07 u[IU]/mL (ref 0.35–5.50)

## 2022-05-15 MED ORDER — ESCITALOPRAM OXALATE 10 MG PO TABS: 10.0000 mg | ORAL_TABLET | Freq: Every day | ORAL | 3 refills | Status: DC

## 2022-05-15 MED ORDER — TELMISARTAN 80 MG PO TABS: ORAL_TABLET | ORAL | 3 refills | Status: DC

## 2022-05-15 MED ORDER — ATORVASTATIN CALCIUM 80 MG PO TABS: 80.0000 mg | ORAL_TABLET | Freq: Every day | ORAL | 3 refills | Status: DC

## 2022-05-15 MED ORDER — METOPROLOL TARTRATE 50 MG PO TABS
50.0000 mg | ORAL_TABLET | Freq: Two times a day (BID) | ORAL | 3 refills | Status: DC
Start: 2022-05-15 — End: 2022-06-27

## 2022-05-15 MED ORDER — ALENDRONATE SODIUM 70 MG PO TABS: 70.0000 mg | ORAL_TABLET | ORAL | 3 refills | Status: AC

## 2022-05-15 MED ORDER — EZETIMIBE 10 MG PO TABS: 10.0000 mg | ORAL_TABLET | Freq: Every day | ORAL | 3 refills | Status: DC

## 2022-05-15 MED ORDER — AMLODIPINE BESYLATE 2.5 MG PO TABS: 2.5000 mg | ORAL_TABLET | Freq: Every day | ORAL | 3 refills | Status: DC

## 2022-05-15 MED ORDER — ALBUTEROL SULFATE HFA 108 (90 BASE) MCG/ACT IN AERS: 2.0000 | INHALATION_SPRAY | Freq: Four times a day (QID) | RESPIRATORY_TRACT | 11 refills | Status: DC | PRN

## 2022-05-15 NOTE — Progress Notes (Signed)
Patient ID: Elizabeth [name], female   DOB: [birth year], 71 y.o.   MRN: [id number]        Chief Complaint: follow up chest and right periscapular pain with recent bruising noted right medial upper arm       HPI:  Elizabeth [name] is a 71 y.o. female here with c/o lifitng a  heavy object about 1 wk ago and suffering some sort of tearing pain near the right periscapular area, sharp, constant, moderate, with some radiation to the right upper chest at times, and associated with noting a bruise that seemed to track from the shoulder to the right elbow at the medial upper arm only;  pain worse to lie down, better to sit up, overall improved now and has good arm, shoulder and neck function it seemd, but decided today to this looked at; denies  no neck, upper back or other arm radicular symptoms or weakness; pain now minimal and bruising essentialy resolved.  Pt denies other chest pain, increased sob or doe, wheezing, orthopnea, PND, increased LE swelling, palpitations, dizziness or syncope.   Pt denies polydipsia, polyuria, or new focal neuro s/s.    Pt denies fever, wt loss, night sweats, loss of appetite, or other constitutional symptoms  Still smoking, not ready to quit.  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; groin hematoma and lower abdominal hematoma then   Degenerative joint disease    Generalized   Hyperlipidemia    Hypertension    Myocardial infarction ([place])    x3, last MI was 6 years ago per pt/Stent placed   Osteoporosis 06/05/2017    Sickle cell trait ([place])    Stroke ([place]) 2017   Tobacco abuse April, 2011   Continued   Past Surgical History:  Procedure Laterality Date   ANGIOPLASTY  2006   x3 stents   COLONOSCOPY  05/20/2011   DILATION AND CURETTAGE OF UTERUS     EP IMPLANTABLE DEVICE N/A 05/21/2016   Procedure: Loop Recorder Insertion;  Surgeon: Thompson Grayer, MD;  Location: [facility];  Service: Cardiovascular;  Laterality: N/A;   FOOT SURGERY  2000   LITHOTRIPSY  2008   left kidney stone and also ureteral stent placed   POLYPECTOMY     TEE WITHOUT CARDIOVERSION N/A 05/21/2016   Procedure: TRANSESOPHAGEAL ECHOCARDIOGRAM (TEE);  Surgeon: Lelon Perla, MD;  Location: [facility] ENDOSCOPY;  Service: Cardiovascular;  Laterality: N/A;    reports that she has been smoking cigarettes. She has been smoking an average of .5 packs per day. She has never used smokeless tobacco. She reports that she does not drink alcohol and does not use drugs. family history includes Coronary artery disease in her father and mother; Heart disease in her brother; Stroke in her mother. Allergies  Allergen Reactions   Shrimp [Shellfish Allergy] Nausea And Vomiting   Adhesive [Tape] Other (See Comments)  Unknown   Soy Allergy Nausea And Vomiting   Current Outpatient Medications on File Prior to Visit  Medication Sig Dispense Refill   aspirin 325 MG EC tablet TAKE 1 TABLET BY MOUTH EVERY DAY 90 tablet 11   vitamin B-12 (CYANOCOBALAMIN) 1000 MCG tablet Take 1 tablet (1,000 mcg total) by mouth daily. 90 tablet 3   No current facility-administered medications on file prior to visit.        ROS:  All others reviewed and negative.  Objective        PE:  BP 120/72 (BP Location: Right Arm, Patient Position: Sitting, Cuff Size: Normal)   Pulse 77   Temp 98.2 F (36.8 C) (Oral)   Ht '5\' 3"'$  (1.6 m)   Wt 126 lb (57.2 kg)   SpO2 96%   BMI 22.32 kg/m                 Constitutional: Pt appears in NAD               HENT: Head: NCAT.                 Right Ear: External ear normal.                 Left Ear: External ear normal.                Eyes: . Pupils are equal, round, and reactive to light. Conjunctivae and EOM are normal               Nose: without d/c or deformity               Neck: Neck supple. Gross normal ROM               Cardiovascular: Normal rate and regular rhythm.                 Pulmonary/Chest: Effort normal and breath sounds without rales or wheezing.                Abd:  Soft, NT, ND, + BS, no organomegaly                No specific right neck upper back shoudler, arm or chest swellng, tender or bruising noted               Neurological: Pt is alert. At baseline orientation, motor grossly intact               Skin: Skin is warm. No rashes, no other new lesions, LE edema - none               Psychiatric: Pt behavior is normal without agitation   Micro: none  Cardiac tracings I have personally interpreted today:  none  Pertinent Radiological findings (summarize): none   Lab Results  Component Value Date   WBC 8.3 05/15/2022   HGB 13.7 05/15/2022   HCT 41.6 05/15/2022   PLT 143.0 (L) 05/15/2022   GLUCOSE 98 05/15/2022   CHOL 222 (H) 05/15/2022   TRIG 101.0 05/15/2022   HDL 51.30 05/15/2022   LDLDIRECT 164.8 01/17/2010   LDLCALC 151 (H) 05/15/2022   ALT 12 05/15/2022   AST 14 05/15/2022   NA 137 05/15/2022   K 3.9 05/15/2022   CL 102 05/15/2022   CREATININE 0.88 05/15/2022   BUN 11 05/15/2022   CO2 25 05/15/2022   TSH 2.07 05/15/2022   INR 0.95 05/19/2016   HGBA1C 5.7  07/06/2021   Assessment/Plan:  Elizabeth [name] is a 71 y.o. Black or African American [2] female with  has a past medical history of Abrasion or friction burn of foot and toe(s), with infection, Allergy, Anxiety, CAD (coronary artery disease), Degenerative joint disease, Hyperlipidemia, Hypertension, Myocardial infarction [facility]), Osteoporosis (06/05/2017), Sickle cell trait ([place]), Stroke [facility]) (2017), and Tobacco abuse (April,  2011).  B12 deficiency Lab Results  Component Value Date   VITAMINB12 320 05/15/2022   Low normal, to restart oral replacement - b12 1000 mcg qd   Mixed hyperlipidemia Lab Results  Component Value Date   LDLCALC 151 (H) 05/15/2022   Stable, pt to restart current statin lipitor80 and zetia 10 mg   Vitamin D deficiency Last vitamin D Lab Results  Component Value Date   VD25OH 17.88 (L) 05/15/2022   Low, to start oral replacement   Right shoulder pain Most post shoulder and right periscapular, c/w msk strain but possible tear as well with recent bruising noted, for cxr and right shoulder films, and consider refer sport med if does not continue to improve  Tobacco abuse Pt counsled to quit smoking, pt not ready  Followup: Return in about 6 months (around 11/15/2022).  Cathlean Cower, MD 05/18/2022 7:38 PM [facility]

## 2022-05-15 NOTE — Patient Instructions (Addendum)
Please continue all other medications as before, and refills have been done if requested.  Please have the pharmacy call with any other refills you may need.  Please continue your efforts at being more active, low cholesterol diet, and weight control.  You are otherwise up to date with prevention measures today.  Please keep your appointments with your specialists as you may have planned  Please go to the XRAY Department in the first floor for the x-ray testing  Please go to the LAB at the blood drawing area for the tests to be done  You will be contacted by phone if any changes need to be made immediately.  Otherwise, you will receive a letter about your results with an explanation, but please check with MyChart first.  Please remember to sign up for MyChart if you have not done so, as this will be important to you in the future with finding out test results, communicating by private email, and scheduling acute appointments online when needed.  Please make an Appointment to return in 6 months, or sooner if needed 

## 2022-05-17 ENCOUNTER — Other Ambulatory Visit: Payer: Self-pay | Admitting: Internal Medicine

## 2022-05-18 NOTE — Assessment & Plan Note (Signed)
Lab Results  Component Value Date   VITAMINB12 320 05/15/2022   Low normal, to restart oral replacement - b12 1000 mcg qd

## 2022-05-18 NOTE — Assessment & Plan Note (Signed)
Last vitamin D Lab Results  Component Value Date   VD25OH 17.88 (L) 05/15/2022   Low, to start oral replacement

## 2022-05-18 NOTE — Assessment & Plan Note (Signed)
Most post shoulder and right periscapular, c/w msk strain but possible tear as well with recent bruising noted, for cxr and right shoulder films, and consider refer sport med if does not continue to improve

## 2022-05-18 NOTE — Assessment & Plan Note (Signed)
Lab Results  Component Value Date   LDLCALC 151 (H) 05/15/2022   Stable, pt to restart current statin lipitor80 and zetia 10 mg

## 2022-05-18 NOTE — Assessment & Plan Note (Signed)
Pt counsled to quit smoking, pt not ready 

## 2022-05-19 ENCOUNTER — Inpatient Hospital Stay (HOSPITAL_COMMUNITY)
Admission: EM | Admit: 2022-05-19 | Discharge: 2022-05-23 | DRG: 247 | Disposition: A | Discharge status: Home / Self Care | Payer: Medicare Other | Attending: Internal Medicine | Admitting: Internal Medicine

## 2022-05-19 ENCOUNTER — Encounter (HOSPITAL_COMMUNITY): Payer: Self-pay

## 2022-05-19 ENCOUNTER — Observation Stay (HOSPITAL_COMMUNITY): Payer: Medicare Other

## 2022-05-19 ENCOUNTER — Other Ambulatory Visit: Payer: Self-pay

## 2022-05-19 ENCOUNTER — Emergency Department (HOSPITAL_COMMUNITY): Payer: Medicare Other

## 2022-05-19 DIAGNOSIS — Z79899 Other long term (current) drug therapy: Secondary | ICD-10-CM

## 2022-05-19 DIAGNOSIS — R011 Cardiac murmur, unspecified: Secondary | ICD-10-CM | POA: Diagnosis not present

## 2022-05-19 DIAGNOSIS — M810 Age-related osteoporosis without current pathological fracture: Secondary | ICD-10-CM | POA: Diagnosis present

## 2022-05-19 DIAGNOSIS — T82855A Stenosis of coronary artery stent, initial encounter: Secondary | ICD-10-CM | POA: Diagnosis not present

## 2022-05-19 DIAGNOSIS — I249 Acute ischemic heart disease, unspecified: Secondary | ICD-10-CM | POA: Diagnosis present

## 2022-05-19 DIAGNOSIS — E785 Hyperlipidemia, unspecified: Secondary | ICD-10-CM | POA: Diagnosis not present

## 2022-05-19 DIAGNOSIS — Z91048 Other nonmedicinal substance allergy status: Secondary | ICD-10-CM | POA: Diagnosis not present

## 2022-05-19 DIAGNOSIS — E782 Mixed hyperlipidemia: Secondary | ICD-10-CM

## 2022-05-19 DIAGNOSIS — J811 Chronic pulmonary edema: Secondary | ICD-10-CM | POA: Diagnosis present

## 2022-05-19 DIAGNOSIS — R778 Other specified abnormalities of plasma proteins: Secondary | ICD-10-CM

## 2022-05-19 DIAGNOSIS — I10 Essential (primary) hypertension: Secondary | ICD-10-CM | POA: Diagnosis not present

## 2022-05-19 DIAGNOSIS — Z8249 Family history of ischemic heart disease and other diseases of the circulatory system: Secondary | ICD-10-CM | POA: Diagnosis not present

## 2022-05-19 DIAGNOSIS — Z91018 Allergy to other foods: Secondary | ICD-10-CM

## 2022-05-19 DIAGNOSIS — Y831 Surgical operation with implant of artificial internal device as the cause of abnormal reaction of the patient, or of later complication, without mention of misadventure at the time of the procedure: Secondary | ICD-10-CM | POA: Diagnosis present

## 2022-05-19 DIAGNOSIS — Z823 Family history of stroke: Secondary | ICD-10-CM

## 2022-05-19 DIAGNOSIS — I252 Old myocardial infarction: Secondary | ICD-10-CM | POA: Diagnosis not present

## 2022-05-19 DIAGNOSIS — Z7982 Long term (current) use of aspirin: Secondary | ICD-10-CM

## 2022-05-19 DIAGNOSIS — I25110 Atherosclerotic heart disease of native coronary artery with unstable angina pectoris: Principal | ICD-10-CM | POA: Diagnosis present

## 2022-05-19 DIAGNOSIS — R059 Cough, unspecified: Secondary | ICD-10-CM | POA: Diagnosis not present

## 2022-05-19 DIAGNOSIS — Z7983 Long term (current) use of bisphosphonates: Secondary | ICD-10-CM

## 2022-05-19 DIAGNOSIS — R0789 Other chest pain: Secondary | ICD-10-CM | POA: Diagnosis not present

## 2022-05-19 DIAGNOSIS — R079 Chest pain, unspecified: Secondary | ICD-10-CM | POA: Diagnosis present

## 2022-05-19 DIAGNOSIS — Z91013 Allergy to seafood: Secondary | ICD-10-CM

## 2022-05-19 DIAGNOSIS — F17210 Nicotine dependence, cigarettes, uncomplicated: Secondary | ICD-10-CM | POA: Diagnosis present

## 2022-05-19 DIAGNOSIS — Z955 Presence of coronary angioplasty implant and graft: Secondary | ICD-10-CM | POA: Diagnosis not present

## 2022-05-19 DIAGNOSIS — Z8673 Personal history of transient ischemic attack (TIA), and cerebral infarction without residual deficits: Secondary | ICD-10-CM | POA: Diagnosis not present

## 2022-05-19 DIAGNOSIS — I200 Unstable angina: Secondary | ICD-10-CM | POA: Diagnosis not present

## 2022-05-19 DIAGNOSIS — Z95828 Presence of other vascular implants and grafts: Secondary | ICD-10-CM | POA: Diagnosis not present

## 2022-05-19 LAB — CBC
HCT: 40.6 % (ref 36.0–46.0)
Hemoglobin: 13.7 g/dL (ref 12.0–15.0)
MCH: 30.6 pg (ref 26.0–34.0)
MCHC: 33.7 g/dL (ref 30.0–36.0)
MCV: 90.6 fL (ref 80.0–100.0)
Platelets: 155 10*3/uL (ref 150–400)
RBC: 4.48 MIL/uL (ref 3.87–5.11)
RDW: 15.8 % — ABNORMAL HIGH (ref 11.5–15.5)
WBC: 8.2 10*3/uL (ref 4.0–10.5)
nRBC: 0 % (ref 0.0–0.2)

## 2022-05-19 LAB — BASIC METABOLIC PANEL
Anion gap: 9 (ref 5–15)
BUN: 6 mg/dL — ABNORMAL LOW (ref 8–23)
CO2: 23 mmol/L (ref 22–32)
Calcium: 9.7 mg/dL (ref 8.9–10.3)
Chloride: 107 mmol/L (ref 98–111)
Creatinine, Ser: 0.88 mg/dL (ref 0.44–1.00)
GFR, Estimated: >60 mL/min (ref >60)
Glucose, Bld: 123 mg/dL — ABNORMAL HIGH (ref 70–99)
Potassium: 4 mmol/L (ref 3.5–5.1)
Sodium: 139 mmol/L (ref 135–145)

## 2022-05-19 LAB — ECHOCARDIOGRAM COMPLETE
Area-P 1/2: 2.01 cm2
MV M vel: 5.75 m/s
MV Peak grad: 132.3 mmHg
Radius: 0.5 cm

## 2022-05-19 LAB — TROPONIN I (HIGH SENSITIVITY)
Troponin I (High Sensitivity): 42 ng/L — ABNORMAL HIGH (ref <18)
Troponin I (High Sensitivity): 75 ng/L — ABNORMAL HIGH (ref <18)

## 2022-05-19 LAB — BRAIN NATRIURETIC PEPTIDE: B Natriuretic Peptide: 430.5 pg/mL — ABNORMAL HIGH (ref 0.0–100.0)

## 2022-05-19 MED ORDER — ACETAMINOPHEN 325 MG PO TABS
650.0000 mg | ORAL_TABLET | ORAL | Status: DC | PRN
  Administered 2022-05-22 – 2022-05-23 (×3): 650 mg via ORAL
  Filled 2022-05-19 (×3): qty 2

## 2022-05-19 MED ORDER — NITROGLYCERIN 0.4 MG SL SUBL
0.4000 mg | SUBLINGUAL_TABLET | Freq: Once | SUBLINGUAL | Status: DC
  Filled 2022-05-19: qty 1

## 2022-05-19 MED ORDER — ALBUTEROL SULFATE HFA 108 (90 BASE) MCG/ACT IN AERS: 2.0000 | INHALATION_SPRAY | Freq: Four times a day (QID) | RESPIRATORY_TRACT | Status: DC | PRN

## 2022-05-19 MED ORDER — FUROSEMIDE 10 MG/ML IJ SOLN
20.0000 mg | Freq: Once | INTRAMUSCULAR | Status: AC
  Administered 2022-05-19: 20 mg via INTRAVENOUS
  Filled 2022-05-19: qty 2

## 2022-05-19 MED ORDER — ONDANSETRON HCL 4 MG/2ML IJ SOLN: 4.0000 mg | Freq: Four times a day (QID) | INTRAMUSCULAR | Status: DC | PRN

## 2022-05-19 MED ORDER — EZETIMIBE 10 MG PO TABS
10.0000 mg | ORAL_TABLET | Freq: Every day | ORAL | Status: DC
  Administered 2022-05-19 – 2022-05-23 (×4): 10 mg via ORAL
  Filled 2022-05-19 (×4): qty 1

## 2022-05-19 MED ORDER — ESCITALOPRAM OXALATE 10 MG PO TABS
10.0000 mg | ORAL_TABLET | Freq: Every day | ORAL | Status: DC
  Administered 2022-05-19 – 2022-05-23 (×5): 10 mg via ORAL
  Filled 2022-05-19 (×5): qty 1

## 2022-05-19 MED ORDER — AMLODIPINE BESYLATE 2.5 MG PO TABS
2.5000 mg | ORAL_TABLET | Freq: Every day | ORAL | Status: DC
  Administered 2022-05-19 – 2022-05-23 (×3): 2.5 mg via ORAL
  Filled 2022-05-19 (×4): qty 1

## 2022-05-19 MED ORDER — ATORVASTATIN CALCIUM 80 MG PO TABS
80.0000 mg | ORAL_TABLET | Freq: Every day | ORAL | Status: DC
  Administered 2022-05-19 – 2022-05-22 (×4): 80 mg via ORAL
  Filled 2022-05-19 (×4): qty 1

## 2022-05-19 MED ORDER — ISOSORBIDE MONONITRATE ER 30 MG PO TB24
30.0000 mg | ORAL_TABLET | Freq: Every day | ORAL | Status: DC
  Administered 2022-05-19 – 2022-05-23 (×4): 30 mg via ORAL
  Filled 2022-05-19 (×5): qty 1

## 2022-05-19 MED ORDER — NITROGLYCERIN 0.4 MG SL SUBL: 0.4000 mg | SUBLINGUAL_TABLET | SUBLINGUAL | Status: DC | PRN

## 2022-05-19 MED ORDER — METOPROLOL TARTRATE 50 MG PO TABS
50.0000 mg | ORAL_TABLET | Freq: Two times a day (BID) | ORAL | Status: DC
  Administered 2022-05-19 – 2022-05-23 (×7): 50 mg via ORAL
  Filled 2022-05-19: qty 1
  Filled 2022-05-19: qty 2
  Filled 2022-05-19 (×5): qty 1

## 2022-05-19 MED ORDER — ALBUTEROL SULFATE (2.5 MG/3ML) 0.083% IN NEBU: 2.5000 mg | INHALATION_SOLUTION | Freq: Four times a day (QID) | RESPIRATORY_TRACT | Status: DC | PRN

## 2022-05-19 MED ORDER — ASPIRIN 81 MG PO TBEC
81.0000 mg | DELAYED_RELEASE_TABLET | Freq: Every day | ORAL | Status: DC
  Administered 2022-05-19 – 2022-05-23 (×4): 81 mg via ORAL
  Filled 2022-05-19 (×4): qty 1

## 2022-05-19 MED ORDER — ENOXAPARIN SODIUM 40 MG/0.4ML IJ SOSY: 40.0000 mg | PREFILLED_SYRINGE | INTRAMUSCULAR | Status: DC

## 2022-05-19 MED ORDER — VITAMIN B-12 1000 MCG PO TABS
1000.0000 ug | ORAL_TABLET | Freq: Every day | ORAL | Status: DC
  Administered 2022-05-19 – 2022-05-23 (×4): 1000 ug via ORAL
  Filled 2022-05-19 (×4): qty 1

## 2022-05-19 NOTE — Progress Notes (Signed)
  Echocardiogram 2D Echocardiogram has been performed.  Johny Chess 05/19/2022, 2:58 PM

## 2022-05-19 NOTE — ED Provider Notes (Signed)
[facility] EMERGENCY DEPARTMENT Provider Note   CSN: [id number] Arrival date & time: 05/19/22  1505     History  Chief Complaint  Patient presents with   Chest Pain    Elizabeth [name] is a 71 y.o. female.  Patient here with chest pain for the past week.  On and off.  She was having some right shoulder discomfort after picking up a heavy object last week but has started to feel some different type of chest pain this past week.  Worse with exertion this morning while at church and still having some at rest.  Has a history of CAD and stent placement in the past.  History of stroke.  Current everyday smoker.  Patient states that it was feeling like a burning pain earlier in the week but now feels like a heaviness.  Denies any shortness of breath, weakness, numbness, chills.  The history is provided by the patient.       Home Medications Prior to Admission medications   Medication Sig Start Date End Date Taking? Authorizing Provider  albuterol (VENTOLIN HFA) 108 (90 Base) MCG/ACT inhaler Inhale 2 puffs into the lungs every 6 (six) hours as needed for wheezing or shortness of breath. 05/15/22   Biagio Borg, MD  alendronate (FOSAMAX) 70 MG tablet Take 1 tablet (70 mg total) by mouth every 7 (seven) days. Take with a full glass of water on an empty stomach. 05/15/22   Biagio Borg, MD  amLODipine (NORVASC) 2.5 MG tablet Take 1 tablet (2.5 mg total) by mouth daily. 05/15/22   Biagio Borg, MD  aspirin 325 MG EC tablet TAKE 1 TABLET BY MOUTH EVERY DAY 08/26/21   Biagio Borg, MD  atorvastatin (LIPITOR) 80 MG tablet Take 1 tablet (80 mg total) by mouth daily at 6 PM. 05/15/22   Biagio Borg, MD  escitalopram (LEXAPRO) 10 MG tablet Take 1 tablet (10 mg total) by mouth daily. 05/15/22   Biagio Borg, MD  ezetimibe (ZETIA) 10 MG tablet Take 1 tablet (10 mg total) by mouth daily. 05/15/22   Biagio Borg, MD  metoprolol tartrate (LOPRESSOR) 50 MG tablet Take 1 tablet (50 mg  total) by mouth 2 (two) times daily. 05/15/22   Biagio Borg, MD  nitroGLYCERIN (NITROLINGUAL) 0.4 MG/SPRAY spray PLACE 1 SPRAY UNDER THE TONGUE EVERY 5 (FIVE) MINUTES AS NEEDED. MAY REPEAT UP TO 3 DOSES. 05/17/22   Biagio Borg, MD  telmisartan (MICARDIS) 80 MG tablet 1 tab by mouth once daily 05/15/22   Biagio Borg, MD  vitamin B-12 (CYANOCOBALAMIN) 1000 MCG tablet Take 1 tablet (1,000 mcg total) by mouth daily. 07/06/21   Biagio Borg, MD      Allergies    Shrimp [shellfish allergy], Adhesive [tape], and Soy allergy    Review of Systems   Review of Systems  Physical Exam Updated Vital Signs  ED Triage Vitals  Enc Vitals Group     BP 05/19/22 0956 (!) 162/94     Pulse Rate 05/19/22 0956 93     Resp 05/19/22 0956 17     Temp 05/19/22 0956 98.1 F (36.7 C)     Temp Source 05/19/22 0956 Oral     SpO2 05/19/22 0956 100 %     Weight --      Height --      Head Circumference --      Peak Flow --      Pain Score 05/19/22  6301 5     Pain Loc --      Pain Edu? --      Excl. in [place]? --      Physical Exam Vitals and nursing note reviewed.  Constitutional:      General: She is not in acute distress.    Appearance: She is well-developed.  HENT:     Head: Normocephalic and atraumatic.     Nose: Nose normal.     Mouth/Throat:     Mouth: Mucous membranes are moist.  Eyes:     Extraocular Movements: Extraocular movements intact.     Conjunctiva/sclera: Conjunctivae normal.     Pupils: Pupils are equal, round, and reactive to light.  Cardiovascular:     Rate and Rhythm: Normal rate and regular rhythm.     Pulses: Normal pulses.     Heart sounds: Murmur heard.  Pulmonary:     Effort: Pulmonary effort is normal. No respiratory distress.     Breath sounds: Normal breath sounds.  Abdominal:     Palpations: Abdomen is soft.     Tenderness: There is no abdominal tenderness.  Musculoskeletal:        General: No swelling.     Cervical back: Neck supple.  Skin:    General: Skin  is warm and dry.     Capillary Refill: Capillary refill takes less than 2 seconds.  Neurological:     General: No focal deficit present.     Mental Status: She is alert.  Psychiatric:        Mood and Affect: Mood normal.     ED Results / Procedures / Treatments   Labs (all labs ordered are listed, but only abnormal results are displayed) Labs Reviewed  BASIC METABOLIC PANEL - Abnormal; Notable for the following components:      Result Value   Glucose, Bld 123 (*)    BUN 6 (*)    All other components within normal limits  CBC - Abnormal; Notable for the following components:   RDW 15.8 (*)    All other components within normal limits  BRAIN NATRIURETIC PEPTIDE - Abnormal; Notable for the following components:   B Natriuretic Peptide 430.5 (*)    All other components within normal limits  TROPONIN I (HIGH SENSITIVITY) - Abnormal; Notable for the following components:   Troponin I (High Sensitivity) 42 (*)    All other components within normal limits  TROPONIN I (HIGH SENSITIVITY) - Abnormal; Notable for the following components:   Troponin I (High Sensitivity) 75 (*)    All other components within normal limits    EKG EKG Interpretation  Date/Time:  Sunday May 19 2022 10:01:01 EDT Ventricular Rate:  80 PR Interval:  154 QRS Duration: 76 QT Interval:  420 QTC Calculation: 484 R Axis:   90 Text Interpretation: Normal sinus rhythm Biatrial enlargement When compared with ECG of 19-May-2016 14:35, PREVIOUS ECG IS PRESENT Confirmed by Lennice Sites ([id number]) on 05/19/2022 10:59:49 AM  Radiology DG Chest 2 View  Result Date: 05/19/2022 CLINICAL DATA:  Chest pain, cough. EXAM: CHEST - 2 VIEW COMPARISON:  May 15, 2022. FINDINGS: Stable cardiomediastinal silhouette. Stable reticular opacities are noted throughout both lungs concerning for pulmonary edema. No pneumothorax or pleural effusion is noted. Bony thorax is unremarkable. IMPRESSION: Stable bilateral reticular opacities  are noted most consistent with pulmonary edema. Electronically Signed   By: Marijo Conception M.D.   On: 05/19/2022 10:30    Procedures Procedures    Medications  Ordered in ED Medications  nitroGLYCERIN (NITROSTAT) SL tablet 0.4 mg (0 mg Sublingual Hold 05/19/22 1328)  metoprolol tartrate (LOPRESSOR) tablet 50 mg (50 mg Oral Given 05/19/22 1328)  amLODipine (NORVASC) tablet 2.5 mg (2.5 mg Oral Given 05/19/22 1328)  isosorbide mononitrate (IMDUR) 24 hr tablet 30 mg (30 mg Oral Given 05/19/22 1328)  furosemide (LASIX) injection 20 mg (20 mg Intravenous Given 05/19/22 1329)    ED Course/ Medical Decision Making/ A&P                           Medical Decision Making Amount and/or Complexity of Data Reviewed Labs: ordered. Radiology: ordered.  Risk Prescription drug management. Decision regarding hospitalization.   Elizabeth [name] is here with chest pain.  History of CAD, hypertension, high cholesterol, tobacco abuse, stroke.  Has been having chest pain on and off with exertion this past week.  Has had cardiac stents but has not had stress test or heart cath in a long time she states.  She initially was having some right shoulder pain after picking up a heavy object this week but has continued to have now some more chest pressure with exertion.  Does not seem to be reproducible.  Was a little bit burning pain but feels pretty similar to when she had a heart attack in the past.  Chest pain got worse this morning with exertion while at church.  Chest pain has improved but still having some mild chest pain.  EKG per my review and interpretation shows sinus rhythm.  No ischemic changes.  She denies any recent surgery or travel.  She is not short of breath.  I have no concern for PE. Wells Criteria 0. Differential diagnosis is likely ACS versus reflux versus MSK versus less likely pneumonia.  Will check CBC, BMP, troponin, chest x-ray.  Per my review and interpretation of labs is no significant anemia,  electrolyte abnormality, kidney injury.  Troponin mildly elevated at 42.  Chest x-ray with no signs of pneumonia or pneumothorax.  May be some edema per radiology report.  Will trend troponin and get a BNP.  Will consult cardiology.  Cardiology is recommending second troponin and an echocardiogram.  She does have a prominent murmur.  Decision on disposition pending these testing.  BNP is elevated to 430.  Troponin up trended to 75.  Overall not sure if this is volume related or if this is an NSTEMI or valvular heart disease.  Patient to be admitted to cardiology team for further work-up.  This chart was dictated using voice recognition software.  Despite best efforts to proofread,  errors can occur which can change the documentation meaning.         Final Clinical Impression(s) / ED Diagnoses Final diagnoses:  Chest pain, unspecified type  Elevated troponin    Rx / DC Orders ED Discharge Orders     None         Lennice Sites, DO 05/19/22 1331

## 2022-05-19 NOTE — H&P (Addendum)
Cardiology H&P   Patient ID: Elizabeth [name] MRN: [id number]; DOB: 11/11/1950  Admit date: 05/19/2022  PCP:  Biagio Borg, MD   [place] [facility] Cardiologist: Previously Dr. Ron Parker in 2012  Patient Profile:   Elizabeth [name] is a 71 y.o. female with a hx of CAD (s/p PTCA to RCA and LCx in 2001, STEMI in 2006 with DES to RCA and 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), HTN, HLD, tobacco use and prior CVA who is being seen 05/19/2022 for the evaluation of chest pain at the request of Dr. Ronnald Nian.  History of Present Illness:   [name] has not had routine follow-up with Cardiology as her last office visit was in 2012 and she did have a LINQ in place following a CVA but last transmission was in 2021.  She presented to [facility] ED on 05/19/2022 for evaluation of intermittent shoulder pain and chest pain for the past week. In talking with the patient today, she reports being in her usual state of health until 1 week ago. She reports she has noticed episodes of burning along her chest which occurs with activity. This has occurred with walking for exercise or cleaning her home. However, she reports the pain can be worse at times with movement of her right arm. Pain typically resolves with rest. She reports having the burning sensation in the past when she required intervention but her symptoms have not been as severe with these episodes. This morning, she developed a worsening burning sensation along her chest while serving communion at church which prompted her to seek evaluation.  Reports the pain spontaneously resolved and she is pain-free currently. She denies any specific dyspnea on exertion, abdominal distention or lower extremity edema. Does report two-pillow orthopnea but says her weight has been stable.  She is still smoking approximately 0.5 ppd.   Initial labs shows WBC 8.2, Hgb 13.7, platelets 155, Na+ 139, K+ 4.0 and creatinine 0.88. Initial Hs Troponin 42 with repeat pending. BNP pending.  CXR shows stable bilateral reticular opacities most consistent with pulmonary edema. EKG shows NSR, HR 80 with lateral infarct pattern.   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, 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; groin hematoma and lower abdominal hematoma then   Degenerative joint disease    Generalized   Hyperlipidemia    Hypertension    Myocardial infarction ([place])    x3, last MI was 6 years ago per pt/Stent placed   Osteoporosis 06/05/2017   Sickle cell trait ([place])    Stroke ([place]) 2017   Tobacco abuse April, 2011   Continued    Past Surgical History:  Procedure Laterality Date   ANGIOPLASTY  2006   x3 stents   COLONOSCOPY  05/20/2011   DILATION AND CURETTAGE OF UTERUS     EP IMPLANTABLE DEVICE N/A 05/21/2016   Procedure: Loop Recorder Insertion;  Surgeon: Thompson Grayer, MD;  Location: [place] [facility];  Service: Cardiovascular;  Laterality: N/A;   FOOT SURGERY  2000   LITHOTRIPSY  2008   left kidney stone and also ureteral stent placed   POLYPECTOMY     TEE WITHOUT CARDIOVERSION N/A 05/21/2016   Procedure: TRANSESOPHAGEAL ECHOCARDIOGRAM (TEE);  Surgeon: Lelon Perla, MD;  Location: [facility];  Service: Cardiovascular;  Laterality: N/A;  Home Medications:  Prior to Admission medications   Medication Sig Start Date End Date Taking? Authorizing Provider  albuterol (VENTOLIN HFA) 108 (90 Base) MCG/ACT inhaler Inhale 2 puffs into the lungs every 6 (six) hours as needed for wheezing or shortness of breath. 05/15/22   Biagio Borg, MD  alendronate (FOSAMAX) 70 MG tablet Take 1 tablet (70 mg total) by mouth every 7 (seven) days. Take with a full glass of water on an empty stomach. 05/15/22   Biagio Borg, MD  amLODipine (NORVASC) 2.5 MG tablet Take 1 tablet (2.5 mg total) by mouth daily. 05/15/22   Biagio Borg, MD  aspirin 325 MG  EC tablet TAKE 1 TABLET BY MOUTH EVERY DAY 08/26/21   Biagio Borg, MD  atorvastatin (LIPITOR) 80 MG tablet Take 1 tablet (80 mg total) by mouth daily at 6 PM. 05/15/22   Biagio Borg, MD  escitalopram (LEXAPRO) 10 MG tablet Take 1 tablet (10 mg total) by mouth daily. 05/15/22   Biagio Borg, MD  ezetimibe (ZETIA) 10 MG tablet Take 1 tablet (10 mg total) by mouth daily. 05/15/22   Biagio Borg, MD  metoprolol tartrate (LOPRESSOR) 50 MG tablet Take 1 tablet (50 mg total) by mouth 2 (two) times daily. 05/15/22   Biagio Borg, MD  nitroGLYCERIN (NITROLINGUAL) 0.4 MG/SPRAY spray PLACE 1 SPRAY UNDER THE TONGUE EVERY 5 (FIVE) MINUTES AS NEEDED. MAY REPEAT UP TO 3 DOSES. 05/17/22   Biagio Borg, MD  telmisartan (MICARDIS) 80 MG tablet 1 tab by mouth once daily 05/15/22   Biagio Borg, MD  vitamin B-12 (CYANOCOBALAMIN) 1000 MCG tablet Take 1 tablet (1,000 mcg total) by mouth daily. 07/06/21   Biagio Borg, MD    Inpatient Medications: Scheduled Meds:  nitroGLYCERIN  0.4 mg Sublingual Once   Continuous Infusions:  PRN Meds:   Allergies:    Allergies  Allergen Reactions   Shrimp [Shellfish Allergy] Nausea And Vomiting   Adhesive [Tape] Other (See Comments)    Unknown   Soy Allergy Nausea And Vomiting    Social History:   Social History   Socioeconomic History   Marital status: Married    Spouse name: Not on file   Number of children: Not on file   Years of education: Not on file   Highest education level: Not on file  Occupational History   Not on file  Tobacco Use   Smoking status: Every Day    Packs/day: 0.50    Types: Cigarettes   Smokeless tobacco: Never   Tobacco comments:    Ongoing since 04/2005  Vaping Use   Vaping Use: Never used  Substance and Sexual Activity   Alcohol use: No   Drug use: No   Sexual activity: Not on file  Other Topics Concern   Not on file  Social History Narrative   Not on file   Social Determinants of Health   Financial Resource Strain:  Not on file  Food Insecurity: Not on file  Transportation Needs: Not on file  Physical Activity: Not on file  Stress: Not on file  Social Connections: Not on file  Intimate Partner Violence: Not on file    Family History:    Family History  Problem Relation Age of Onset   Coronary artery disease Mother    Stroke Mother    Coronary artery disease Father    Heart disease Brother    Colon cancer Neg Hx    Colon polyps  Neg Hx    Esophageal cancer Neg Hx    Stomach cancer Neg Hx    Rectal cancer Neg Hx      ROS:  Please see the history of present illness.   All other ROS reviewed and negative.     Physical Exam/Data:   Vitals:   05/19/22 0956  BP: (!) 162/94  Pulse: 93  Resp: 17  Temp: 98.1 F (36.7 C)  TempSrc: Oral  SpO2: 100%   No intake or output data in the 24 hours ending 05/19/22 1141    05/15/2022   11:30 AM 07/06/2021    9:56 AM 07/05/2020    9:03 AM  Last 3 Weights  Weight (lbs) 126 lb 123 lb 12.8 oz 125 lb  Weight (kg) 57.153 kg 56.155 kg 56.7 kg     There is no height or weight on file to calculate BMI.  General:  Well nourished, well developed female appearing in no acute distress HEENT: normal Neck: no JVD Vascular: No carotid bruits; Distal pulses 2+ bilaterally Cardiac:  normal S1, S2; RRR; 2/6 systolic murmur along Apex.  Lungs:  clear to auscultation bilaterally, no wheezing, rhonchi or rales  Abd: soft, nontender, no hepatomegaly  Ext: no pitting edema Musculoskeletal:  No deformities, BUE and BLE strength normal and equal Skin: warm and dry  Neuro:  CNs 2-12 intact, no focal abnormalities noted Psych:  Normal affect   EKG:  The EKG was personally reviewed and demonstrates: NSR, HR 80 with lateral infarct pattern.  Telemetry:  Telemetry was personally reviewed and demonstrates: NSR, HR in 70's to 90's with occasional PVC's and an episode of NSVT for 3 beats.   Relevant CV Studies:  Cardiac Catheterization: 2006  DATE OF PROCEDURE:   05/22/2005  DATE OF DISCHARGE:                               CARDIAC CATHETERIZATION    HISTORY:  Mrs. Goga is 71 years old and has documented coronary disease  with previous stenting of the mid-right coronary in 2001. She had been lost  to follow-up over the last couple years. She presented tonight with severe  chest pain to the emergency room and EKG changes and an acute inferior  lateral infarction. She was seen by Dr. Wilhemina Cash and transported promptly to  the catheterization lab for catheterization and possible intervention. She  was given heparin and aspirin in the emergency department. She has a history  of cigarette smoking and hyperlipidemia.    PROCEDURE NOTE:  The procedure was performed via the right femoral artery  using an arterial sheath and 6-French preformed coronary catheters. An  femoral wall arterial punch was performed and Omnipaque contrast was used. A  venous sheath was placed for access. After completion of the diagnostic  study we made a decision to proceed with intervention on the right coronary  artery. The patient had been given heparin in the emergency department and  her ACT was greater than 300 and we elected to use heparin alone, and we  gave her 600 mg of Plavix. There was a tight lesion in the ostium and  proximal portion of the posterior descending artery of the right coronary  artery. We used a 6-French JR-4 guiding catheter with side holes and a PT 2  light support wire. We crossed the lesion in the posterior descending branch  with the wire without difficulty. We dilated with a 2.25  x 20 mm Maverick  balloon performing two inflations up to 80 atmospheres for 30 seconds. We  then attempted to deploy a 2.5 x 20 mm Taxus stent. We were unable to  advance the stent into the posterior descending branch. We then used a buddy  wire, placing the buddy wire in the AV branch but not selectively in the  posterior descending branch. With the help of the buddy  wire we were able to  advance the stent into the posterior descending branch and we positioned the  proximal edge just at the ostium. We performed an initial inflation of 8  atmospheres for 25 seconds, and then we pulled the balloon back and  performed a second inflation up to 12 atmospheres for 30 seconds. We post  dilated with a 2.5 x 50 mm Quantum Maverick and performed one inflation in  the proximal and ostial portion of the stent up to 14 atmospheres for 30  seconds. Final diagnostic studies were then performed through the guiding  catheter. The patient tolerated the procedure well and left the laboratory  in satisfactory condition.    RESULTS:  The aortic pressure was 122/71 with mean of 92 and left  ventricular pressure of 122/17.    The left main coronary artery was free of significant disease.    Left anterior descending artery gave rise to a large diagonal branch and  several septal perforators. The largest septal perforator came off the  diagonal branch. There was 90% stenosis in the mid LAD with a band right  after the stenosis. There was 70% stenosis in the large diagonal branch.    The circumflex artery gave rise to a small atrial branch, two small marginal  branches, a large marginal branch, and two posterolateral branches. There  was 70-80% stenosis in the ostium of the large marginal branch. There was  70% narrowing in the first posterolateral branch which was segmental.    The right side coronary was a large vessel that gave rise to a right  ventricular branch, a posterior descending branch, and three posterolateral  branches. There was 30% proximal narrowing. There was less than 10%  narrowing at the stent in the mid vessel. There was 95% narrowing in the  proximal and ostial portion of the posterior descending branch with TIMI 2  flow distally. There was 70% narrowing in the first of the three  posterolateral branches.    The left ventriculogram performed in the  RAO projection showed akinesis of  the mid inferior wall almost to the apex. The inferobasal area was spared.  The estimated ejection fraction was 50%.    Following stenting of the lesion in the proximal portion of posterior  descending branch of the right coronary artery, the stenosis improved from  95% to 0% and the flow improved from TIMI 3 to TIMI 3 flow. The AV branch  did not appear to be compromised.    The patient had the onset of chest pain at 2200 and arrived at [facility]  emergency room 2255. She arrived in the catheterization laboratory at 2348  and the first balloon inflation was performed at [place] on May 22, 2005.  This gave a door-balloon time of 83 minutes and reperfusion time of 2 hours  18 minutes.    CONCLUSION:  1.  Acute diaphragmatic wall infarction with 90% stenosis in the mid left      anterior descending coronary artery, 70% stenosis in the large diagonal      branch  of the left anterior descending coronary artery, 70-80% stenosis      in the ostium of the large marginal branch of the circumflex artery, 70%      narrowing in the posterolateral branch of the circumflex artery, less      than 10% narrowing at the stent site in the mid-right coronary artery,      and 95% stenosis in the proximal and ostial portion of the posterior      descending branch of the right coronary with TIMI 3 flow, and inferior      wall akinesis.  2.  Successful percutaneous coronary intervention of the posterior      descending branch of the right coronary using a Taxus drug-eluting stent      with improvement of narrowing from 95% to 0%  and improvement in flow      from TIMI 2 to TIMI 3 flow.   TEE: 04/2016 Study Conclusions   - Left ventricle: There was focal basal hypertrophy. Systolic    function was normal. The estimated ejection fraction was in the    range of 60% to 65%. Wall motion was normal; there were no    regional wall motion abnormalities.  - Aortic valve: No  evidence of vegetation.  - Mitral valve: No evidence of vegetation. There was mild    regurgitation.  - Left atrium: No evidence of thrombus in the atrial cavity or    appendage.  - Right atrium: No evidence of thrombus in the atrial cavity or    appendage.  - Atrial septum: There was increased thickness of the septum,    consistent with lipomatous hypertrophy. No defect or patent    foramen ovale was identified.  - Tricuspid valve: No evidence of vegetation.  - Pulmonic valve: No evidence of vegetation.  - Pericardium, extracardiac: A trivial pericardial effusion was    identified.   Impressions:   - Normal LV systolic function; LVH with proximal septal thickening;    prominent lipomatous hypertrophy of atrial septum; mild MR;    negative saline microcavitation study.  Laboratory Data:  High Sensitivity Troponin:   Recent Labs  Lab 05/19/22 1005  TROPONINIHS 42*     Chemistry Recent Labs  Lab 05/15/22 1208 05/19/22 1005  NA 137 139  K 3.9 4.0  CL 102 107  CO2 25 23  GLUCOSE 98 123*  BUN 11 6*  CREATININE 0.88 0.88  CALCIUM 9.9 9.7  GFRNONAA  --  >60  ANIONGAP  --  9    Recent Labs  Lab 05/15/22 1208  PROT 1.1*  ALBUMIN 4.0  AST 14  ALT 12  ALKPHOS 87  BILITOT 0.2   Lipids  Recent Labs  Lab 05/15/22 1208  CHOL 222*  TRIG 101.0  HDL 51.30  LDLCALC 151*  CHOLHDL 4    Hematology Recent Labs  Lab 05/15/22 1208 05/19/22 1005  WBC 8.3 8.2  RBC 4.53 4.48  HGB 13.7 13.7  HCT 41.6 40.6  MCV 91.9 90.6  MCH  --  30.6  MCHC 33.0 33.7  RDW 15.5 15.8*  PLT 143.0* 155   Thyroid  Recent Labs  Lab 05/15/22 1208  TSH 2.07    BNPNo results for input(s): "BNP", "PROBNP" in the last 168 hours.  DDimer No results for input(s): "DDIMER" in the last 168 hours.   Radiology/Studies:  DG Chest 2 View  Result Date: 05/19/2022 CLINICAL DATA:  Chest pain, cough. EXAM: CHEST - 2 VIEW COMPARISON:  May 15, 2022. FINDINGS: Stable cardiomediastinal  silhouette. Stable reticular opacities are noted throughout both lungs concerning for pulmonary edema. No pneumothorax or pleural effusion is noted. Bony thorax is unremarkable. IMPRESSION: Stable bilateral reticular opacities are noted most consistent with pulmonary edema. Electronically Signed   By: Marijo Conception M.D.   On: 05/19/2022 10:30   DG Chest 2 View  Result Date: 05/16/2022 CLINICAL DATA:  Chest pain. EXAM: CHEST - 2 VIEW COMPARISON:  Chest radiograph May 24, 2005 FINDINGS: Cardiac contours upper limits of normal. Coarse bilateral interstitial pulmonary opacities. Trace pleural effusions. No pneumothorax. Thoracic spine degenerative changes. IMPRESSION: Findings suggestive of cardiomegaly and interstitial pulmonary edema. Atypical infection not excluded. Electronically Signed   By: Lovey Newcomer M.D.   On: 05/16/2022 19:39   DG Shoulder Right  Result Date: 05/15/2022 CLINICAL DATA:  Pain. EXAM: RIGHT SHOULDER - 2+ VIEW COMPARISON:  None Available. FINDINGS: Mild glenohumeral and AC joint degenerative changes. No fractures or dislocations. No bony lesions. No other abnormalities. IMPRESSION: Mild glenohumeral and AC joint degenerative changes. No other abnormalities. Electronically Signed   By: Dorise Bullion III M.D.   On: 05/15/2022 15:20     Assessment and Plan:   1. Chest Pain concerning for Accelerating Angina - Presents with exertional chest burning for the past week which acutely worsened today. Pain has been worse at times with movement of her right arm but this is not consistent.  - Initial Hs Troponin mildly elevated to 42 with repeat pending. BNP pending. CXR shows stable bilateral reticular opacities most consistent with pulmonary edema. EKG shows NSR, HR 80 with lateral infarct pattern.  - If 2nd troponin is increasing, she will require admission and likely cardiac catheterization for definitive evaluation given her symptoms and complex CAD history. Given her CXR results  and reported orthopnea, will order IV Lasix '20mg'$  x1. Will also order an echocardiogram for reassessment of her EF and due to her murmur. Additional recommendations pending repeat labs.   2. CAD - She is s/p PTCA to RCA and LCx in 2001 with STEMI in 2006 with DES to RCA and 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 at that time as well. - She will require further ischemic evaluation and will await repeat Hs Troponin to help guide inpatient vs. outpatient evaluation.  - Remains on ASA (can reduce dosing to 81 mg daily), Atorvastatin '80mg'$  daily, Zetia '10mg'$  daily and Lopressor '50mg'$  BID.   3. Systolic Murmur - She does have a prominent murmur on examination. Had mild MR by echo in 2017 and will order a repeat echocardiogram for reassessment.   4. HTN - BP has been elevated while in the ED with SBP in the 160's on most recent check. She did not take her AM medications. Will order PTA Lopressor '50mg'$  BID and Amlodipine 2.'5mg'$  daily. Will add Imdur as discussed above.   5. HLD - Followed by her PCP as an outpatient. Remains on Atorvastatin '80mg'$  daily and Zetia '10mg'$  daily. If she requires admission, would recheck an FLP.   6. Tobacco Use - She continues to smoke 0.5 ppd. Cessation advised.    For questions or updates, please contact [place] Please consult [email or web address] for contact info under    Signed, Erma Heritage, PA-C  05/19/2022 11:41 AM

## 2022-05-19 NOTE — ED Triage Notes (Signed)
Patient complains of intermittent chest pain with right shoulder pain x 1 week. States with exertion has burning in chest. Alert and oriented, some SOB with same

## 2022-05-19 NOTE — ED Notes (Signed)
ED TO INPATIENT HANDOFF REPORT  ED Nurse Name and Phone #: Delainey Winstanley RN [phone]  S Name/Age/Gender Elizabeth [name] 71 y.o. female Room/Bed: 020C/020C  Code Status   Code Status: Full Code  Home/SNF/Other Home Patient oriented to: self, place, time, and situation Is this baseline? Yes   Triage Complete: Triage complete  Chief Complaint Chest pain [R07.9]  Triage Note Patient complains of intermittent chest pain with right shoulder pain x 1 week. States with exertion has burning in chest. Alert and oriented, some SOB with same   Allergies Allergies  Allergen Reactions   Shrimp [Shellfish Allergy] Nausea And Vomiting   Adhesive [Tape] Other (See Comments)    Unknown   Soy Allergy Nausea And Vomiting    Level of Care/Admitting Diagnosis ED Disposition     ED Disposition  Admit   Condition  --   Jud: [facility] [[id number]]  Level of Care: Telemetry Cardiac [103]  May place patient in observation at [facility] or [place] if equivalent level of care is available:: No  Covid Evaluation: Asymptomatic - no recent exposure (last 10 days) testing not required  Diagnosis: Chest pain [732202]  Admitting Physician: Janina Mayo [[id number]]  Attending Physician: Janina Mayo [[id number]]          B Medical/Surgery History 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, 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; groin hematoma and lower abdominal hematoma then   Degenerative joint disease    Generalized   Hyperlipidemia    Hypertension    Myocardial infarction ([place])    x3, last MI was 6 years ago per pt/Stent placed   Osteoporosis 06/05/2017   Sickle cell trait ([place])    Stroke ([place]) 2017   Tobacco abuse April, 2011   Continued   Past Surgical History:  Procedure Laterality Date   ANGIOPLASTY  2006   x3  stents   COLONOSCOPY  05/20/2011   DILATION AND CURETTAGE OF UTERUS     EP IMPLANTABLE DEVICE N/A 05/21/2016   Procedure: Loop Recorder Insertion;  Surgeon: Thompson Grayer, MD;  Location: [facility];  Service: Cardiovascular;  Laterality: N/A;   FOOT SURGERY  2000   LITHOTRIPSY  2008   left kidney stone and also ureteral stent placed   POLYPECTOMY     TEE WITHOUT CARDIOVERSION N/A 05/21/2016   Procedure: TRANSESOPHAGEAL ECHOCARDIOGRAM (TEE);  Surgeon: Lelon Perla, MD;  Location: [facility] ENDOSCOPY;  Service: Cardiovascular;  Laterality: N/A;     A IV Location/Drains/Wounds Patient Lines/Drains/Airways Status     Active Line/Drains/Airways     Name Placement date Placement time Site Days   Peripheral IV 05/20/16 Right;Anterior Forearm 05/20/16  0825  Forearm  2190   Incision (Closed) 05/22/16 Chest Left 05/22/16  1745  -- 2188            Intake/Output Last 24 hours No intake or output data in the 24 hours ending 05/19/22 1915  Labs/Imaging Results for orders placed or performed during the hospital encounter of 05/19/22 (from the past 48 hour(s))  Basic metabolic panel     Status: Abnormal   Collection Time: 05/19/22 10:05 AM  Result Value Ref Range   Sodium 139 135 - 145 mmol/L   Potassium 4.0 3.5 - 5.1  mmol/L   Chloride 107 98 - 111 mmol/L   CO2 23 22 - 32 mmol/L   Glucose, Bld 123 (H) 70 - 99 mg/dL    Comment: Glucose reference range applies only to samples taken after fasting for at least 8 hours.   BUN 6 (L) 8 - 23 mg/dL   Creatinine, Ser 0.88 0.44 - 1.00 mg/dL   Calcium 9.7 8.9 - 10.3 mg/dL   GFR, Estimated >60 >60 mL/min    Comment: (NOTE) Calculated using the CKD-EPI Creatinine Equation (2021)    Anion gap 9 5 - 15    Comment: Performed at [facility] [address], [place], [place] [address]  CBC     Status: Abnormal   Collection Time: 05/19/22 10:05 AM  Result Value Ref Range   WBC 8.2 4.0 - 10.5 K/uL    Comment: WHITE COUNT CONFIRMED ON  SMEAR   RBC 4.48 3.87 - 5.11 MIL/uL   Hemoglobin 13.7 12.0 - 15.0 g/dL   HCT 40.6 36.0 - 46.0 %   MCV 90.6 80.0 - 100.0 fL   MCH 30.6 26.0 - 34.0 pg   MCHC 33.7 30.0 - 36.0 g/dL   RDW 15.8 (H) 11.5 - 15.5 %   Platelets 155 150 - 400 K/uL   nRBC 0.0 0.0 - 0.2 %    Comment: Performed at [facility], [place] [address] [place], [place] [address]  Troponin I (High Sensitivity)     Status: Abnormal   Collection Time: 05/19/22 10:05 AM  Result Value Ref Range   Troponin I (High Sensitivity) 42 (H) <18 ng/L    Comment: (NOTE) Elevated high sensitivity troponin I (hsTnI) values and significant  changes across serial measurements may suggest ACS but many other  chronic and acute conditions are known to elevate hsTnI results.  Refer to the "Links" section for chest pain algorithms and additional  guidance. Performed at [facility], [place] [address] [place], [place] [address]   Troponin I (High Sensitivity)     Status: Abnormal   Collection Time: 05/19/22 12:25 PM  Result Value Ref Range   Troponin I (High Sensitivity) 75 (H) <18 ng/L    Comment: RESULT CALLED TO, READ BACK BY AND VERIFIED WITH A. MCGINUS EMCP 05/19/22 '@1326'$  BY J. WHITE (NOTE) Elevated high sensitivity troponin I (hsTnI) values and significant  changes across serial measurements may suggest ACS but many other  chronic and acute conditions are known to elevate hsTnI results.  Refer to the "Links" section for chest pain algorithms and additional  guidance. Performed at [facility], [place] [address], [place], [place] [address]   Brain natriuretic peptide     Status: Abnormal   Collection Time: 05/19/22 12:26 PM  Result Value Ref Range   B Natriuretic Peptide 430.5 (H) 0.0 - 100.0 pg/mL    Comment: Performed at [facility] [address], [place],  [address]   ECHOCARDIOGRAM COMPLETE  Result Date: 05/19/2022    ECHOCARDIOGRAM REPORT   Patient Name:   Elizabeth [name] Date of Exam: 05/19/2022  Medical Rec #:  [id number]      Height:       63.0 in Accession #:    [id number]     Weight:       126.0 lb Date of Birth:  01-25-1951      BSA:          1.589 m Patient Age:    65 years  BP:           147/91 mmHg Patient Gender: F              HR:           72 bpm. Exam Location:  Inpatient Procedure: 2D Echo Indications:    murmur  History:        Patient has prior history of Echocardiogram examinations, most                 recent 05/21/2016. CAD, Signs/Symptoms:Chest Pain; Risk                 Factors:Hypertension, Dyslipidemia and Current Smoker.  Sonographer:    Johny Chess RDCS Referring Phys: [id number] [place]  1. Left ventricular ejection fraction, by estimation, is 60 to 65%. The left ventricle has normal function. The left ventricle has no regional wall motion abnormalities. There is mild asymmetric left ventricular hypertrophy of the basal-septal segment. Left ventricular diastolic parameters are indeterminate.  2. Right ventricular systolic function is normal. The right ventricular size is normal. There is normal pulmonary artery systolic pressure.  3. Anterior leaflet with leaflet tip calcification and subvalvular apparatus calcification. The mitral valve is abnormal. Mild to moderate mitral valve regurgitation. Moderate mitral annular calcification.  4. The aortic valve is tricuspid. Aortic valve regurgitation is not visualized. No aortic stenosis is present.  5. The inferior vena cava is normal in size with greater than 50% respiratory variability, suggesting right atrial pressure of 3 mmHg. Comparison(s): Changes from prior study are noted. Mitral valve with leaflet and subvalvular calcification as noted, mild-moderate MR. Conclusion(s)/Recommendation(s): Otherwise normal echocardiogram, with minor abnormalities described in the report. FINDINGS  Left Ventricle: Left ventricular ejection fraction, by estimation, is 60 to 65%. The left ventricle has normal function. The  left ventricle has no regional wall motion abnormalities. The left ventricular internal cavity size was normal in size. There is  mild asymmetric left ventricular hypertrophy of the basal-septal segment. Left ventricular diastolic parameters are indeterminate. Right Ventricle: The right ventricular size is normal. Right vetricular wall thickness was not well visualized. Right ventricular systolic function is normal. There is normal pulmonary artery systolic pressure. The tricuspid regurgitant velocity is 1.94 m/s, and with an assumed right atrial pressure of 3 mmHg, the estimated right ventricular systolic pressure is 34.1 mmHg. Left Atrium: Left atrial size was normal in size. Right Atrium: Right atrial size was normal in size. Pericardium: There is no evidence of pericardial effusion. Presence of epicardial fat layer. Mitral Valve: Anterior leaflet with leaflet tip calcification and subvalvular apparatus calcification. The mitral valve is abnormal. There is mild thickening of the mitral valve leaflet(s). There is moderate calcification of the mitral valve leaflet(s). Moderate mitral annular calcification. Mild to moderate mitral valve regurgitation. MV peak gradient, 14.3 mmHg. The mean mitral valve gradient is 5.0 mmHg. Tricuspid Valve: The tricuspid valve is normal in structure. Tricuspid valve regurgitation is trivial. No evidence of tricuspid stenosis. Aortic Valve: The aortic valve is tricuspid. Aortic valve regurgitation is not visualized. No aortic stenosis is present. Pulmonic Valve: The pulmonic valve was grossly normal. Pulmonic valve regurgitation is trivial. No evidence of pulmonic stenosis. Aorta: The aortic root, ascending aorta, aortic arch and descending aorta are all structurally normal, with no evidence of dilitation or obstruction. Venous: The inferior vena cava is normal in size with greater than 50% respiratory variability, suggesting right atrial pressure of 3 mmHg. IAS/Shunts: The atrial  septum is grossly normal.  LEFT VENTRICLE PLAX 2D LVOT diam:     1.70 cm   Diastology LV SV:         57        LV e' medial:    5.44 cm/s LV SV Index:   36        LV E/e' medial:  19.9 LVOT Area:     2.27 cm  LV e' lateral:   6.42 cm/s                          LV E/e' lateral: 16.8  RIGHT VENTRICLE             IVC RV S prime:     12.70 cm/s  IVC diam: 1.00 cm TAPSE (M-mode): 1.8 cm LEFT ATRIUM             Index        RIGHT ATRIUM          Index LA Vol (A2C):   46.7 ml 29.39 ml/m  RA Area:     9.26 cm LA Vol (A4C):   43.9 ml 27.63 ml/m  RA Volume:   17.70 ml 11.14 ml/m LA Biplane Vol: 45.7 ml 28.76 ml/m  AORTIC VALVE LVOT Vmax:   131.00 cm/s LVOT Vmean:  81.900 cm/s LVOT VTI:    0.250 m  AORTA Ao Root diam: 3.20 cm Ao Asc diam:  3.20 cm MITRAL VALVE                  TRICUSPID VALVE MV Area (PHT): 2.01 cm       TR Peak grad:   15.1 mmHg MV Peak grad:  14.3 mmHg      TR Vmax:        194.00 cm/s MV Mean grad:  5.0 mmHg MV Vmax:       1.89 m/s       SHUNTS MV Vmean:      99.4 cm/s      Systemic VTI:  0.25 m MV Decel Time: 377 msec       Systemic Diam: 1.70 cm MR Peak grad:    132.2 mmHg MR Mean grad:    85.0 mmHg MR Vmax:         575.00 cm/s MR Vmean:        438.0 cm/s MR PISA:         1.57 cm MR PISA Eff ROA: 11 mm MR PISA Radius:  0.50 cm MV E velocity: 108.00 cm/s MV A velocity: 165.00 cm/s MV E/A ratio:  0.65 Buford Dresser MD Electronically signed by Buford Dresser MD Signature Date/Time: 05/19/2022/3:58:33 PM    Final    DG Chest 2 View  Result Date: 05/19/2022 CLINICAL DATA:  Chest pain, cough. EXAM: CHEST - 2 VIEW COMPARISON:  May 15, 2022. FINDINGS: Stable cardiomediastinal silhouette. Stable reticular opacities are noted throughout both lungs concerning for pulmonary edema. No pneumothorax or pleural effusion is noted. Bony thorax is unremarkable. IMPRESSION: Stable bilateral reticular opacities are noted most consistent with pulmonary edema. Electronically Signed   By: Marijo Conception M.D.   On: 05/19/2022 10:30    Pending Labs Unresulted Labs (From admission, onward)     Start     Ordered   05/20/22 0500  Lipoprotein A (LPA)  Tomorrow morning,   R        05/19/22 1708   05/20/22 0500  Lipid panel  Tomorrow morning,   R  05/19/22 1708   05/20/22 5621  Basic metabolic panel  Tomorrow morning,   R        05/19/22 1708            Vitals/Pain Today's Vitals   05/19/22 1329 05/19/22 1410 05/19/22 1644 05/19/22 1851  BP: (!) 147/91  111/79   Pulse: 74  70   Resp: (!) 21  16   Temp:  98.2 F (36.8 C)  97.8 F (36.6 C)  TempSrc:    Oral  SpO2: 99%  95%   PainSc:        Isolation Precautions No active isolations  Medications Medications  nitroGLYCERIN (NITROSTAT) SL tablet 0.4 mg (0 mg Sublingual Hold 05/19/22 1328)  metoprolol tartrate (LOPRESSOR) tablet 50 mg (50 mg Oral Given 05/19/22 1328)  amLODipine (NORVASC) tablet 2.5 mg (2.5 mg Oral Given 05/19/22 1328)  isosorbide mononitrate (IMDUR) 24 hr tablet 30 mg (30 mg Oral Given 05/19/22 1328)  aspirin EC tablet 81 mg (has no administration in time range)  atorvastatin (LIPITOR) tablet 80 mg (has no administration in time range)  ezetimibe (ZETIA) tablet 10 mg (has no administration in time range)  escitalopram (LEXAPRO) tablet 10 mg (has no administration in time range)  cyanocobalamin (VITAMIN B12) tablet 1,000 mcg (has no administration in time range)  nitroGLYCERIN (NITROSTAT) SL tablet 0.4 mg (has no administration in time range)  acetaminophen (TYLENOL) tablet 650 mg (has no administration in time range)  ondansetron (ZOFRAN) injection 4 mg (has no administration in time range)  enoxaparin (LOVENOX) injection 40 mg (has no administration in time range)  albuterol (PROVENTIL) (2.5 MG/3ML) 0.083% nebulizer solution 2.5 mg (has no administration in time range)  furosemide (LASIX) injection 20 mg (20 mg Intravenous Given 05/19/22 1329)    Mobility walks with person assist Low fall risk    R Recommendations: See Admitting Provider Note  Report given to: redden aguirre RN  Additional Notes:

## 2022-05-20 ENCOUNTER — Encounter (HOSPITAL_COMMUNITY)
Admission: EM | Disposition: A | Discharge status: Home / Self Care | Payer: Self-pay | Source: Home / Self Care | Attending: Internal Medicine

## 2022-05-20 DIAGNOSIS — I10 Essential (primary) hypertension: Secondary | ICD-10-CM | POA: Diagnosis not present

## 2022-05-20 DIAGNOSIS — Z91013 Allergy to seafood: Secondary | ICD-10-CM | POA: Diagnosis not present

## 2022-05-20 DIAGNOSIS — I25110 Atherosclerotic heart disease of native coronary artery with unstable angina pectoris: Secondary | ICD-10-CM | POA: Diagnosis not present

## 2022-05-20 DIAGNOSIS — Z823 Family history of stroke: Secondary | ICD-10-CM | POA: Diagnosis not present

## 2022-05-20 DIAGNOSIS — Z7983 Long term (current) use of bisphosphonates: Secondary | ICD-10-CM | POA: Diagnosis not present

## 2022-05-20 DIAGNOSIS — Z8673 Personal history of transient ischemic attack (TIA), and cerebral infarction without residual deficits: Secondary | ICD-10-CM | POA: Diagnosis not present

## 2022-05-20 DIAGNOSIS — I200 Unstable angina: Secondary | ICD-10-CM | POA: Diagnosis not present

## 2022-05-20 DIAGNOSIS — F17210 Nicotine dependence, cigarettes, uncomplicated: Secondary | ICD-10-CM | POA: Diagnosis not present

## 2022-05-20 DIAGNOSIS — R778 Other specified abnormalities of plasma proteins: Secondary | ICD-10-CM | POA: Diagnosis present

## 2022-05-20 DIAGNOSIS — Z91048 Other nonmedicinal substance allergy status: Secondary | ICD-10-CM | POA: Diagnosis not present

## 2022-05-20 DIAGNOSIS — Z95828 Presence of other vascular implants and grafts: Secondary | ICD-10-CM | POA: Diagnosis not present

## 2022-05-20 DIAGNOSIS — I252 Old myocardial infarction: Secondary | ICD-10-CM | POA: Diagnosis not present

## 2022-05-20 DIAGNOSIS — I249 Acute ischemic heart disease, unspecified: Secondary | ICD-10-CM | POA: Diagnosis not present

## 2022-05-20 DIAGNOSIS — Z7982 Long term (current) use of aspirin: Secondary | ICD-10-CM | POA: Diagnosis not present

## 2022-05-20 DIAGNOSIS — Z91018 Allergy to other foods: Secondary | ICD-10-CM | POA: Diagnosis not present

## 2022-05-20 DIAGNOSIS — Y831 Surgical operation with implant of artificial internal device as the cause of abnormal reaction of the patient, or of later complication, without mention of misadventure at the time of the procedure: Secondary | ICD-10-CM | POA: Diagnosis present

## 2022-05-20 DIAGNOSIS — Z79899 Other long term (current) drug therapy: Secondary | ICD-10-CM | POA: Diagnosis not present

## 2022-05-20 DIAGNOSIS — M810 Age-related osteoporosis without current pathological fracture: Secondary | ICD-10-CM | POA: Diagnosis present

## 2022-05-20 DIAGNOSIS — Z8249 Family history of ischemic heart disease and other diseases of the circulatory system: Secondary | ICD-10-CM | POA: Diagnosis not present

## 2022-05-20 DIAGNOSIS — T82855A Stenosis of coronary artery stent, initial encounter: Secondary | ICD-10-CM | POA: Diagnosis not present

## 2022-05-20 DIAGNOSIS — E785 Hyperlipidemia, unspecified: Secondary | ICD-10-CM | POA: Diagnosis not present

## 2022-05-20 DIAGNOSIS — Z955 Presence of coronary angioplasty implant and graft: Secondary | ICD-10-CM | POA: Diagnosis not present

## 2022-05-20 DIAGNOSIS — J811 Chronic pulmonary edema: Secondary | ICD-10-CM | POA: Diagnosis not present

## 2022-05-20 HISTORY — PX: LEFT HEART CATH AND CORONARY ANGIOGRAPHY: CATH118249

## 2022-05-20 LAB — LIPID PANEL
Cholesterol: 161 mg/dL (ref 0–200)
HDL: 50 mg/dL (ref >40)
LDL Cholesterol: 100 mg/dL — ABNORMAL HIGH (ref 0–99)
Total CHOL/HDL Ratio: 3.2 RATIO
Triglycerides: 53 mg/dL (ref <150)
VLDL: 11 mg/dL (ref 0–40)

## 2022-05-20 LAB — BASIC METABOLIC PANEL
Anion gap: 5 (ref 5–15)
BUN: 9 mg/dL (ref 8–23)
CO2: 24 mmol/L (ref 22–32)
Calcium: 9.5 mg/dL (ref 8.9–10.3)
Chloride: 106 mmol/L (ref 98–111)
Creatinine, Ser: 0.96 mg/dL (ref 0.44–1.00)
GFR, Estimated: >60 mL/min (ref >60)
Glucose, Bld: 83 mg/dL (ref 70–99)
Potassium: 3.9 mmol/L (ref 3.5–5.1)
Sodium: 135 mmol/L (ref 135–145)

## 2022-05-20 LAB — TROPONIN I (HIGH SENSITIVITY): Troponin I (High Sensitivity): 119 ng/L (ref <18)

## 2022-05-20 SURGERY — LEFT HEART CATH AND CORONARY ANGIOGRAPHY
Anesthesia: LOCAL

## 2022-05-20 MED ORDER — SODIUM CHLORIDE 0.9 % IV SOLN: INTRAVENOUS | Status: DC

## 2022-05-20 MED ORDER — MIDAZOLAM HCL 2 MG/2ML IJ SOLN
INTRAMUSCULAR | Status: DC | PRN
  Administered 2022-05-20: 1 mg via INTRAVENOUS

## 2022-05-20 MED ORDER — LABETALOL HCL 5 MG/ML IV SOLN: 10.0000 mg | INTRAVENOUS | Status: AC | PRN

## 2022-05-20 MED ORDER — HYDRALAZINE HCL 20 MG/ML IJ SOLN: 10.0000 mg | INTRAMUSCULAR | Status: AC | PRN

## 2022-05-20 MED ORDER — SODIUM CHLORIDE 0.9 % IV SOLN: INTRAVENOUS | Status: AC

## 2022-05-20 MED ORDER — VERAPAMIL HCL 2.5 MG/ML IV SOLN
INTRAVENOUS | Status: AC
  Filled 2022-05-20: qty 2

## 2022-05-20 MED ORDER — SODIUM CHLORIDE 0.9% FLUSH: 3.0000 mL | INTRAVENOUS | Status: DC | PRN

## 2022-05-20 MED ORDER — SODIUM CHLORIDE 0.9 % IV SOLN: 250.0000 mL | INTRAVENOUS | Status: DC | PRN

## 2022-05-20 MED ORDER — TICAGRELOR 90 MG PO TABS
180.0000 mg | ORAL_TABLET | Freq: Once | ORAL | Status: AC
Start: 2022-05-20 — End: 2022-05-20
  Administered 2022-05-20: 180 mg via ORAL
  Filled 2022-05-20: qty 2

## 2022-05-20 MED ORDER — LIDOCAINE HCL (PF) 1 % IJ SOLN
INTRAMUSCULAR | Status: AC
  Filled 2022-05-20: qty 30

## 2022-05-20 MED ORDER — HEPARIN (PORCINE) IN NACL 1000-0.9 UT/500ML-% IV SOLN
INTRAVENOUS | Status: DC | PRN
  Administered 2022-05-20 (×2): 500 mL

## 2022-05-20 MED ORDER — HEPARIN (PORCINE) IN NACL 1000-0.9 UT/500ML-% IV SOLN
INTRAVENOUS | Status: AC
  Filled 2022-05-20: qty 500

## 2022-05-20 MED ORDER — ASPIRIN 81 MG PO CHEW: 81.0000 mg | CHEWABLE_TABLET | ORAL | Status: AC

## 2022-05-20 MED ORDER — HEPARIN SODIUM (PORCINE) 1000 UNIT/ML IJ SOLN
INTRAMUSCULAR | Status: AC
  Filled 2022-05-20: qty 10

## 2022-05-20 MED ORDER — ORAL CARE MOUTH RINSE: 15.0000 mL | OROMUCOSAL | Status: DC | PRN

## 2022-05-20 MED ORDER — SODIUM CHLORIDE 0.9% FLUSH
3.0000 mL | Freq: Two times a day (BID) | INTRAVENOUS | Status: DC
  Administered 2022-05-21 – 2022-05-23 (×2): 3 mL via INTRAVENOUS

## 2022-05-20 MED ORDER — SODIUM CHLORIDE 0.9% FLUSH
3.0000 mL | INTRAVENOUS | Status: DC | PRN
Start: 2022-05-20 — End: 2022-05-20

## 2022-05-20 MED ORDER — TICAGRELOR 90 MG PO TABS
90.0000 mg | ORAL_TABLET | Freq: Two times a day (BID) | ORAL | Status: DC
  Administered 2022-05-21 – 2022-05-23 (×5): 90 mg via ORAL
  Filled 2022-05-20 (×5): qty 1

## 2022-05-20 MED ORDER — MIDAZOLAM HCL 2 MG/2ML IJ SOLN
INTRAMUSCULAR | Status: AC
  Filled 2022-05-20: qty 2

## 2022-05-20 MED ORDER — ENOXAPARIN SODIUM 40 MG/0.4ML IJ SOSY
40.0000 mg | PREFILLED_SYRINGE | INTRAMUSCULAR | Status: DC
  Administered 2022-05-21: 40 mg via SUBCUTANEOUS
  Filled 2022-05-20: qty 0.4

## 2022-05-20 MED ORDER — HEPARIN SODIUM (PORCINE) 1000 UNIT/ML IJ SOLN
INTRAMUSCULAR | Status: DC | PRN
  Administered 2022-05-20: 3000 [IU] via INTRAVENOUS

## 2022-05-20 MED ORDER — VERAPAMIL HCL 2.5 MG/ML IV SOLN
INTRAVENOUS | Status: DC | PRN
  Administered 2022-05-20: 10 mL via INTRA_ARTERIAL

## 2022-05-20 MED ORDER — FENTANYL CITRATE (PF) 100 MCG/2ML IJ SOLN
INTRAMUSCULAR | Status: AC
  Filled 2022-05-20: qty 2

## 2022-05-20 MED ORDER — FENTANYL CITRATE (PF) 100 MCG/2ML IJ SOLN
INTRAMUSCULAR | Status: DC | PRN
Start: 2022-05-20 — End: 2022-05-20
  Administered 2022-05-20: 25 ug via INTRAVENOUS

## 2022-05-20 MED ORDER — LIDOCAINE HCL (PF) 1 % IJ SOLN
INTRAMUSCULAR | Status: DC | PRN
  Administered 2022-05-20: 2 mL

## 2022-05-20 MED ORDER — SODIUM CHLORIDE 0.9% FLUSH
3.0000 mL | Freq: Two times a day (BID) | INTRAVENOUS | Status: DC
  Administered 2022-05-20 (×2): 3 mL via INTRAVENOUS

## 2022-05-20 SURGICAL SUPPLY — 10 items
BAND CMPR LRG ZPHR (HEMOSTASIS) ×1
BAND ZEPHYR COMPRESS 30 LONG (HEMOSTASIS) IMPLANT
CATH 5FR JL3.5 JR4 ANG PIG MP (CATHETERS) IMPLANT
GLIDESHEATH SLEND SS 6F .021 (SHEATH) IMPLANT
GUIDEWIRE INQWIRE 1.5J.035X260 (WIRE) IMPLANT
INQWIRE 1.5J .035X260CM (WIRE) ×1
KIT HEART LEFT (KITS) ×2 IMPLANT
PACK CARDIAC CATHETERIZATION (CUSTOM PROCEDURE TRAY) ×2 IMPLANT
TRANSDUCER W/STOPCOCK (MISCELLANEOUS) ×2 IMPLANT
TUBING CIL FLEX 10 FLL-RA (TUBING) ×2 IMPLANT

## 2022-05-20 NOTE — Progress Notes (Signed)
Anjana complained of feeling anxious. She feels as though it correlates with taking Brilianta. She feels jittery and short of breath.  Vitals are stable. HR 73 NSR, BP 152/87, oxygen saturations are 99%. Applied supplemental 02 at 2L for comfort.  Notified PA on call Maylon Peppers. Received recommendations to provide patient with caffeinated soda.

## 2022-05-20 NOTE — H&P (View-Only) (Signed)
Rounding Note    Patient Name: Elizabeth [name] Date of Encounter: 05/20/2022  [facility] Cardiologist: Dr. Phineas Inches  Subjective   Patient reports feeling well this morning. Denies chest pain or dyspnea. The burning chest pain is similar to the pain she had during her prior MI.   Inpatient Medications    Scheduled Meds:  amLODipine  2.5 mg Oral Daily   aspirin EC  81 mg Oral Daily   atorvastatin  80 mg Oral q1800   cyanocobalamin  1,000 mcg Oral Daily   enoxaparin (LOVENOX) injection  40 mg Subcutaneous Q24H   escitalopram  10 mg Oral Daily   ezetimibe  10 mg Oral Daily   isosorbide mononitrate  30 mg Oral Daily   metoprolol tartrate  50 mg Oral BID   nitroGLYCERIN  0.4 mg Sublingual Once   sodium chloride flush  3 mL Intravenous Q12H   Continuous Infusions:  PRN Meds: acetaminophen, albuterol, nitroGLYCERIN, ondansetron (ZOFRAN) IV   Vital Signs    Vitals:   05/19/22 2025 05/20/22 0016 05/20/22 0415 05/20/22 0802  BP:  116/74 113/67 113/73  Pulse:  64 72   Resp:  '20 16 20  '$ Temp:  98.2 F (36.8 C) 98 F (36.7 C)   TempSrc:  Oral Oral   SpO2:  93% 94% 98%  Weight: 56.1 kg     Height: '5\' 3"'$  (1.6 m)       Intake/Output Summary (Last 24 hours) at 05/20/2022 0855 Last data filed at 05/19/2022 2200 Gross per 24 hour  Intake 50 ml  Output --  Net 50 ml      05/19/2022    8:25 PM 05/15/2022   11:30 AM 07/06/2021    9:56 AM  Last 3 Weights  Weight (lbs) 123 lb 10.9 oz 126 lb 123 lb 12.8 oz  Weight (kg) 56.1 kg 57.153 kg 56.155 kg      Telemetry     ECG    NSR, HR 80 - Personally Reviewed  Physical Exam  GEN: No acute distress.   Neck: supple, no JVD Cardiac: RRR, systolic murmur at apex  Respiratory: Clear to auscultation bilaterally. MS: No edema; No deformity. Neuro:  alert and oriented x 3  Psych: Normal affect   Labs    High Sensitivity Troponin:   Recent Labs  Lab 05/19/22 1005 05/19/22 1225 05/20/22 0521  TROPONINIHS  42* 75* 119*     Chemistry Recent Labs  Lab 05/15/22 1208 05/19/22 1005 05/20/22 0521  NA 137 139 135  K 3.9 4.0 3.9  CL 102 107 106  CO2 '25 23 24  '$ GLUCOSE 98 123* 83  BUN 11 6* 9  CREATININE 0.88 0.88 0.96  CALCIUM 9.9 9.7 9.5  PROT 1.1*  --   --   ALBUMIN 4.0  --   --   AST 14  --   --   ALT 12  --   --   ALKPHOS 87  --   --   BILITOT 0.2  --   --   GFRNONAA  --  >60 >60  ANIONGAP  --  9 5    Lipids  Recent Labs  Lab 05/20/22 0521  CHOL 161  TRIG 53  HDL 50  LDLCALC 100*  CHOLHDL 3.2    Hematology Recent Labs  Lab 05/15/22 1208 05/19/22 1005  WBC 8.3 8.2  RBC 4.53 4.48  HGB 13.7 13.7  HCT 41.6 40.6  MCV 91.9 90.6  MCH  --  30.6  MCHC 33.0 33.7  RDW 15.5 15.8*  PLT 143.0* 155   Thyroid  Recent Labs  Lab 05/15/22 1208  TSH 2.07    BNP Recent Labs  Lab 05/19/22 1226  BNP 430.5*    DDimer No results for input(s): "DDIMER" in the last 168 hours.   Radiology    ECHOCARDIOGRAM COMPLETE  Result Date: 05/19/2022    ECHOCARDIOGRAM REPORT   Patient Name:   Elizabeth [name] Date of Exam: 05/19/2022 Medical Rec #:  [id number]      Height:       63.0 in Accession #:    [id number]     Weight:       126.0 lb Date of Birth:  29-Jul-1951      BSA:          1.589 m Patient Age:    71 years       BP:           147/91 mmHg Patient Gender: F              HR:           72 bpm. Exam Location:  Inpatient Procedure: 2D Echo Indications:    murmur  History:        Patient has prior history of Echocardiogram examinations, most                 recent 05/21/2016. CAD, Signs/Symptoms:Chest Pain; Risk                 Factors:Hypertension, Dyslipidemia and Current Smoker.  Sonographer:    Johny Chess RDCS Referring Phys: [id number] [place]  1. Left ventricular ejection fraction, by estimation, is 60 to 65%. The left ventricle has normal function. The left ventricle has no regional wall motion abnormalities. There is mild asymmetric left ventricular hypertrophy  of the basal-septal segment. Left ventricular diastolic parameters are indeterminate.  2. Right ventricular systolic function is normal. The right ventricular size is normal. There is normal pulmonary artery systolic pressure.  3. Anterior leaflet with leaflet tip calcification and subvalvular apparatus calcification. The mitral valve is abnormal. Mild to moderate mitral valve regurgitation. Moderate mitral annular calcification.  4. The aortic valve is tricuspid. Aortic valve regurgitation is not visualized. No aortic stenosis is present.  5. The inferior vena cava is normal in size with greater than 50% respiratory variability, suggesting right atrial pressure of 3 mmHg. Comparison(s): Changes from prior study are noted. Mitral valve with leaflet and subvalvular calcification as noted, mild-moderate MR. Conclusion(s)/Recommendation(s): Otherwise normal echocardiogram, with minor abnormalities described in the report. FINDINGS  Left Ventricle: Left ventricular ejection fraction, by estimation, is 60 to 65%. The left ventricle has normal function. The left ventricle has no regional wall motion abnormalities. The left ventricular internal cavity size was normal in size. There is  mild asymmetric left ventricular hypertrophy of the basal-septal segment. Left ventricular diastolic parameters are indeterminate. Right Ventricle: The right ventricular size is normal. Right vetricular wall thickness was not well visualized. Right ventricular systolic function is normal. There is normal pulmonary artery systolic pressure. The tricuspid regurgitant velocity is 1.94 m/s, and with an assumed right atrial pressure of 3 mmHg, the estimated right ventricular systolic pressure is 96.2 mmHg. Left Atrium: Left atrial size was normal in size. Right Atrium: Right atrial size was normal in size. Pericardium: There is no evidence of pericardial effusion. Presence of epicardial fat layer. Mitral Valve: Anterior leaflet with leaflet tip  calcification and subvalvular apparatus  calcification. The mitral valve is abnormal. There is mild thickening of the mitral valve leaflet(s). There is moderate calcification of the mitral valve leaflet(s). Moderate mitral annular calcification. Mild to moderate mitral valve regurgitation. MV peak gradient, 14.3 mmHg. The mean mitral valve gradient is 5.0 mmHg. Tricuspid Valve: The tricuspid valve is normal in structure. Tricuspid valve regurgitation is trivial. No evidence of tricuspid stenosis. Aortic Valve: The aortic valve is tricuspid. Aortic valve regurgitation is not visualized. No aortic stenosis is present. Pulmonic Valve: The pulmonic valve was grossly normal. Pulmonic valve regurgitation is trivial. No evidence of pulmonic stenosis. Aorta: The aortic root, ascending aorta, aortic arch and descending aorta are all structurally normal, with no evidence of dilitation or obstruction. Venous: The inferior vena cava is normal in size with greater than 50% respiratory variability, suggesting right atrial pressure of 3 mmHg. IAS/Shunts: The atrial septum is grossly normal.  LEFT VENTRICLE PLAX 2D LVOT diam:     1.70 cm   Diastology LV SV:         57        LV e' medial:    5.44 cm/s LV SV Index:   36        LV E/e' medial:  19.9 LVOT Area:     2.27 cm  LV e' lateral:   6.42 cm/s                          LV E/e' lateral: 16.8  RIGHT VENTRICLE             IVC RV S prime:     12.70 cm/s  IVC diam: 1.00 cm TAPSE (M-mode): 1.8 cm LEFT ATRIUM             Index        RIGHT ATRIUM          Index LA Vol (A2C):   46.7 ml 29.39 ml/m  RA Area:     9.26 cm LA Vol (A4C):   43.9 ml 27.63 ml/m  RA Volume:   17.70 ml 11.14 ml/m LA Biplane Vol: 45.7 ml 28.76 ml/m  AORTIC VALVE LVOT Vmax:   131.00 cm/s LVOT Vmean:  81.900 cm/s LVOT VTI:    0.250 m  AORTA Ao Root diam: 3.20 cm Ao Asc diam:  3.20 cm MITRAL VALVE                  TRICUSPID VALVE MV Area (PHT): 2.01 cm       TR Peak grad:   15.1 mmHg MV Peak grad:  14.3 mmHg       TR Vmax:        194.00 cm/s MV Mean grad:  5.0 mmHg MV Vmax:       1.89 m/s       SHUNTS MV Vmean:      99.4 cm/s      Systemic VTI:  0.25 m MV Decel Time: 377 msec       Systemic Diam: 1.70 cm MR Peak grad:    132.2 mmHg MR Mean grad:    85.0 mmHg MR Vmax:         575.00 cm/s MR Vmean:        438.0 cm/s MR PISA:         1.57 cm MR PISA Eff ROA: 11 mm MR PISA Radius:  0.50 cm MV E velocity: 108.00 cm/s MV A velocity: 165.00 cm/s MV E/A ratio:  0.65  Buford Dresser MD Electronically signed by Buford Dresser MD Signature Date/Time: 05/19/2022/3:58:33 PM    Final    DG Chest 2 View  Result Date: 05/19/2022 CLINICAL DATA:  Chest pain, cough. EXAM: CHEST - 2 VIEW COMPARISON:  May 15, 2022. FINDINGS: Stable cardiomediastinal silhouette. Stable reticular opacities are noted throughout both lungs concerning for pulmonary edema. No pneumothorax or pleural effusion is noted. Bony thorax is unremarkable. IMPRESSION: Stable bilateral reticular opacities are noted most consistent with pulmonary edema. Electronically Signed   By: Marijo Conception M.D.   On: 05/19/2022 10:30    Cardiac Studies   Repeat troponin 119 from 75.   Patient Profile     71 y.o. female with a hx of CAD (s/p PTCA to RCA and LCx in 2001, STEMI in 2006 with DES to RCA and 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), HTN, HLD, tobacco use and prior CVA who is being seen 05/20/2022 for the evaluation of chest pain at the request of Dr. Ronnald Nian.  Assessment & Plan    1. Chest Pain concerning for Accelerating Angina Presented at ED with exertional chest burning for the past week which acutely worsened. Pain has been worse at times with movement of her right arm but this is not consistent.  - Patient reports feeling well this morning. Denies chest pain or dyspnea.  -Repeat troponin 119 today. Given the troponin rise and her history of 3 stents, she would benefit from cardiac catheterization. She will be NPO.  - Echocardiogram showed 60-65%. Noted  moderate mitral regurgitation. CXR shows stable bilateral reticular opacities most consistent with pulmonary edema. She received one dose of IV lasix 20 mg.    2. CAD - She is s/p PTCA to RCA and LCx in 2001 with STEMI in 2006 with DES to RCA and 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 at that time as well. - Remains on ASA 81 mg daily, Atorvastatin '80mg'$  daily, Zetia '10mg'$  daily and Lopressor '50mg'$  BID.    3. Systolic Murmur - She does have a prominent murmur on examination. Had mild MR by echo in 2017. Repeat echo showed mild-moderate MR.   4. HTN - BP has been in 110/70. She is on Lopressor '50mg'$  BID, Amlodipine 2.'5mg'$  daily, Imdur 30 mg daily.    5. HLD - Followed by her PCP as an outpatient. Remains on Atorvastatin '80mg'$  daily and Zetia '10mg'$  daily. If she requires admission, would recheck an FLP.    6. Tobacco Use - She continues to smoke 0.5 ppd. Cessation advised.      For questions or updates, please contact [place] Please consult [email or web address] for contact info under        Signed, Angelique Blonder, DO  05/20/2022, 8:55 AM     ATTENDING ATTESTATION:  After conducting a review of all available clinical information with the care team, interviewing the patient, and performing a physical exam, I agree with the findings and plan described in this note.   GEN: No acute distress.   HEENT:  MMM, no JVD, no scleral icterus Cardiac: RRR, no murmurs, rubs, or gallops.  Respiratory: Clear to auscultation bilaterally. GI: Soft, nontender, non-distended  MS: No edema; No deformity. Neuro:  Nonfocal  Vasc:  +2 radial pulses  The patient is a 71 year old female with a history of previous remote stenting of the mid right coronary artery prior to 2006.  In 2006 she presented with an acute coronary syndrome.  She underwent stenting of her right posterior descending artery and staged PCI of the  proximal LAD.  I was able to review these films.  She has moderate disease of the diagonal and first obtuse  marginal as well; a stent is present in the mid right coronary artery.  She also has a history of hypertension, hyperlipidemia, and ongoing tobacco abuse.  She presented with an acute coronary syndrome with an elevated troponin.  Her EKG is reassuring with no acute ischemic changes.  Her echocardiogram demonstrates preserved LV function with moderate mitral regurgitation.  Her anginal symptoms consist of chest burning.  This is reminiscent of her presentation in 2006.  Given elevated troponin and previous history of coronary artery disease with stenting I think an invasive assessment is required.  She has a strong right radial pulse.  I have reviewed the risks, indications, and alternatives to cardiac catheterization, possible angioplasty, and stenting with the patient. Risks include but are not limited to bleeding, infection, vascular injury, stroke, myocardial infection, arrhythmia, kidney injury, radiation-related injury in the case of prolonged fluoroscopy use, emergency cardiac surgery, and death. The patient understands the risks of serious complication is 1-2 in 3893 with diagnostic cardiac cath and 1-2% or less with angioplasty/stenting.    Lenna Sciara, MD Pager [phone]

## 2022-05-20 NOTE — H&P (Addendum)
Note placed in H/P template in error (see new progress note)

## 2022-05-20 NOTE — Care Management Obs Status (Signed)
[place] NOTIFICATION   Patient Details  Name: Naesha Buckalew MRN: [id number] Date of Birth: 1950-12-08   Medicare Observation Status Notification Given:  Yes    Dawayne Patricia, RN 05/20/2022, 3:52 PM

## 2022-05-20 NOTE — Progress Notes (Addendum)
Rounding Note    Patient Name: Elizabeth [name] Date of Encounter: 05/20/2022  [facility] Cardiologist: Dr. Phineas Inches  Subjective   Patient reports feeling well this morning. Denies chest pain or dyspnea. The burning chest pain is similar to the pain she had during her prior MI.   Inpatient Medications    Scheduled Meds:  amLODipine  2.5 mg Oral Daily   aspirin EC  81 mg Oral Daily   atorvastatin  80 mg Oral q1800   cyanocobalamin  1,000 mcg Oral Daily   enoxaparin (LOVENOX) injection  40 mg Subcutaneous Q24H   escitalopram  10 mg Oral Daily   ezetimibe  10 mg Oral Daily   isosorbide mononitrate  30 mg Oral Daily   metoprolol tartrate  50 mg Oral BID   nitroGLYCERIN  0.4 mg Sublingual Once   sodium chloride flush  3 mL Intravenous Q12H   Continuous Infusions:  PRN Meds: acetaminophen, albuterol, nitroGLYCERIN, ondansetron (ZOFRAN) IV   Vital Signs    Vitals:   05/19/22 2025 05/20/22 0016 05/20/22 0415 05/20/22 0802  BP:  116/74 113/67 113/73  Pulse:  64 72   Resp:  '20 16 20  '$ Temp:  98.2 F (36.8 C) 98 F (36.7 C)   TempSrc:  Oral Oral   SpO2:  93% 94% 98%  Weight: 56.1 kg     Height: '5\' 3"'$  (1.6 m)       Intake/Output Summary (Last 24 hours) at 05/20/2022 0855 Last data filed at 05/19/2022 2200 Gross per 24 hour  Intake 50 ml  Output --  Net 50 ml      05/19/2022    8:25 PM 05/15/2022   11:30 AM 07/06/2021    9:56 AM  Last 3 Weights  Weight (lbs) 123 lb 10.9 oz 126 lb 123 lb 12.8 oz  Weight (kg) 56.1 kg 57.153 kg 56.155 kg      Telemetry     ECG    NSR, HR 80 - Personally Reviewed  Physical Exam  GEN: No acute distress.   Neck: supple, no JVD Cardiac: RRR, systolic murmur at apex  Respiratory: Clear to auscultation bilaterally. MS: No edema; No deformity. Neuro:  alert and oriented x 3  Psych: Normal affect   Labs    High Sensitivity Troponin:   Recent Labs  Lab 05/19/22 1005 05/19/22 1225 05/20/22 0521  TROPONINIHS  42* 75* 119*     Chemistry Recent Labs  Lab 05/15/22 1208 05/19/22 1005 05/20/22 0521  NA 137 139 135  K 3.9 4.0 3.9  CL 102 107 106  CO2 '25 23 24  '$ GLUCOSE 98 123* 83  BUN 11 6* 9  CREATININE 0.88 0.88 0.96  CALCIUM 9.9 9.7 9.5  PROT 1.1*  --   --   ALBUMIN 4.0  --   --   AST 14  --   --   ALT 12  --   --   ALKPHOS 87  --   --   BILITOT 0.2  --   --   GFRNONAA  --  >60 >60  ANIONGAP  --  9 5    Lipids  Recent Labs  Lab 05/20/22 0521  CHOL 161  TRIG 53  HDL 50  LDLCALC 100*  CHOLHDL 3.2    Hematology Recent Labs  Lab 05/15/22 1208 05/19/22 1005  WBC 8.3 8.2  RBC 4.53 4.48  HGB 13.7 13.7  HCT 41.6 40.6  MCV 91.9 90.6  MCH  --  30.6  MCHC 33.0 33.7  RDW 15.5 15.8*  PLT 143.0* 155   Thyroid  Recent Labs  Lab 05/15/22 1208  TSH 2.07    BNP Recent Labs  Lab 05/19/22 1226  BNP 430.5*    DDimer No results for input(s): "DDIMER" in the last 168 hours.   Radiology    ECHOCARDIOGRAM COMPLETE  Result Date: 05/19/2022    ECHOCARDIOGRAM REPORT   Patient Name:   MAANVI LECOMPTE Date of Exam: 05/19/2022 Medical Rec #:  [id number]      Height:       63.0 in Accession #:    [id number]     Weight:       126.0 lb Date of Birth:  02/04/1951      BSA:          1.589 m Patient Age:    71 years       BP:           147/91 mmHg Patient Gender: F              HR:           72 bpm. Exam Location:  Inpatient Procedure: 2D Echo Indications:    murmur  History:        Patient has prior history of Echocardiogram examinations, most                 recent 05/21/2016. CAD, Signs/Symptoms:Chest Pain; Risk                 Factors:Hypertension, Dyslipidemia and Current Smoker.  Sonographer:    Johny Chess RDCS Referring Phys: [id number] [place]  1. Left ventricular ejection fraction, by estimation, is 60 to 65%. The left ventricle has normal function. The left ventricle has no regional wall motion abnormalities. There is mild asymmetric left ventricular hypertrophy  of the basal-septal segment. Left ventricular diastolic parameters are indeterminate.  2. Right ventricular systolic function is normal. The right ventricular size is normal. There is normal pulmonary artery systolic pressure.  3. Anterior leaflet with leaflet tip calcification and subvalvular apparatus calcification. The mitral valve is abnormal. Mild to moderate mitral valve regurgitation. Moderate mitral annular calcification.  4. The aortic valve is tricuspid. Aortic valve regurgitation is not visualized. No aortic stenosis is present.  5. The inferior vena cava is normal in size with greater than 50% respiratory variability, suggesting right atrial pressure of 3 mmHg. Comparison(s): Changes from prior study are noted. Mitral valve with leaflet and subvalvular calcification as noted, mild-moderate MR. Conclusion(s)/Recommendation(s): Otherwise normal echocardiogram, with minor abnormalities described in the report. FINDINGS  Left Ventricle: Left ventricular ejection fraction, by estimation, is 60 to 65%. The left ventricle has normal function. The left ventricle has no regional wall motion abnormalities. The left ventricular internal cavity size was normal in size. There is  mild asymmetric left ventricular hypertrophy of the basal-septal segment. Left ventricular diastolic parameters are indeterminate. Right Ventricle: The right ventricular size is normal. Right vetricular wall thickness was not well visualized. Right ventricular systolic function is normal. There is normal pulmonary artery systolic pressure. The tricuspid regurgitant velocity is 1.94 m/s, and with an assumed right atrial pressure of 3 mmHg, the estimated right ventricular systolic pressure is 24.2 mmHg. Left Atrium: Left atrial size was normal in size. Right Atrium: Right atrial size was normal in size. Pericardium: There is no evidence of pericardial effusion. Presence of epicardial fat layer. Mitral Valve: Anterior leaflet with leaflet tip  calcification and subvalvular apparatus  calcification. The mitral valve is abnormal. There is mild thickening of the mitral valve leaflet(s). There is moderate calcification of the mitral valve leaflet(s). Moderate mitral annular calcification. Mild to moderate mitral valve regurgitation. MV peak gradient, 14.3 mmHg. The mean mitral valve gradient is 5.0 mmHg. Tricuspid Valve: The tricuspid valve is normal in structure. Tricuspid valve regurgitation is trivial. No evidence of tricuspid stenosis. Aortic Valve: The aortic valve is tricuspid. Aortic valve regurgitation is not visualized. No aortic stenosis is present. Pulmonic Valve: The pulmonic valve was grossly normal. Pulmonic valve regurgitation is trivial. No evidence of pulmonic stenosis. Aorta: The aortic root, ascending aorta, aortic arch and descending aorta are all structurally normal, with no evidence of dilitation or obstruction. Venous: The inferior vena cava is normal in size with greater than 50% respiratory variability, suggesting right atrial pressure of 3 mmHg. IAS/Shunts: The atrial septum is grossly normal.  LEFT VENTRICLE PLAX 2D LVOT diam:     1.70 cm   Diastology LV SV:         57        LV e' medial:    5.44 cm/s LV SV Index:   36        LV E/e' medial:  19.9 LVOT Area:     2.27 cm  LV e' lateral:   6.42 cm/s                          LV E/e' lateral: 16.8  RIGHT VENTRICLE             IVC RV S prime:     12.70 cm/s  IVC diam: 1.00 cm TAPSE (M-mode): 1.8 cm LEFT ATRIUM             Index        RIGHT ATRIUM          Index LA Vol (A2C):   46.7 ml 29.39 ml/m  RA Area:     9.26 cm LA Vol (A4C):   43.9 ml 27.63 ml/m  RA Volume:   17.70 ml 11.14 ml/m LA Biplane Vol: 45.7 ml 28.76 ml/m  AORTIC VALVE LVOT Vmax:   131.00 cm/s LVOT Vmean:  81.900 cm/s LVOT VTI:    0.250 m  AORTA Ao Root diam: 3.20 cm Ao Asc diam:  3.20 cm MITRAL VALVE                  TRICUSPID VALVE MV Area (PHT): 2.01 cm       TR Peak grad:   15.1 mmHg MV Peak grad:  14.3 mmHg       TR Vmax:        194.00 cm/s MV Mean grad:  5.0 mmHg MV Vmax:       1.89 m/s       SHUNTS MV Vmean:      99.4 cm/s      Systemic VTI:  0.25 m MV Decel Time: 377 msec       Systemic Diam: 1.70 cm MR Peak grad:    132.2 mmHg MR Mean grad:    85.0 mmHg MR Vmax:         575.00 cm/s MR Vmean:        438.0 cm/s MR PISA:         1.57 cm MR PISA Eff ROA: 11 mm MR PISA Radius:  0.50 cm MV E velocity: 108.00 cm/s MV A velocity: 165.00 cm/s MV E/A ratio:  0.65  Buford Dresser MD Electronically signed by Buford Dresser MD Signature Date/Time: 05/19/2022/3:58:33 PM    Final    DG Chest 2 View  Result Date: 05/19/2022 CLINICAL DATA:  Chest pain, cough. EXAM: CHEST - 2 VIEW COMPARISON:  May 15, 2022. FINDINGS: Stable cardiomediastinal silhouette. Stable reticular opacities are noted throughout both lungs concerning for pulmonary edema. No pneumothorax or pleural effusion is noted. Bony thorax is unremarkable. IMPRESSION: Stable bilateral reticular opacities are noted most consistent with pulmonary edema. Electronically Signed   By: Marijo Conception M.D.   On: 05/19/2022 10:30    Cardiac Studies   Repeat troponin 119 from 75.   Patient Profile     71 y.o. female with a hx of CAD (s/p PTCA to RCA and LCx in 2001, STEMI in 2006 with DES to RCA and 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), HTN, HLD, tobacco use and prior CVA who is being seen 05/20/2022 for the evaluation of chest pain at the request of Dr. Ronnald Nian.  Assessment & Plan    1. Chest Pain concerning for Accelerating Angina Presented at ED with exertional chest burning for the past week which acutely worsened. Pain has been worse at times with movement of her right arm but this is not consistent.  - Patient reports feeling well this morning. Denies chest pain or dyspnea.  -Repeat troponin 119 today. Given the troponin rise and her history of 3 stents, she would benefit from cardiac catheterization. She will be NPO.  - Echocardiogram showed 60-65%. Noted  moderate mitral regurgitation. CXR shows stable bilateral reticular opacities most consistent with pulmonary edema. She received one dose of IV lasix 20 mg.    2. CAD - She is s/p PTCA to RCA and LCx in 2001 with STEMI in 2006 with DES to RCA and 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 at that time as well. - Remains on ASA 81 mg daily, Atorvastatin '80mg'$  daily, Zetia '10mg'$  daily and Lopressor '50mg'$  BID.    3. Systolic Murmur - She does have a prominent murmur on examination. Had mild MR by echo in 2017. Repeat echo showed mild-moderate MR.   4. HTN - BP has been in 110/70. She is on Lopressor '50mg'$  BID, Amlodipine 2.'5mg'$  daily, Imdur 30 mg daily.    5. HLD - Followed by her PCP as an outpatient. Remains on Atorvastatin '80mg'$  daily and Zetia '10mg'$  daily. If she requires admission, would recheck an FLP.    6. Tobacco Use - She continues to smoke 0.5 ppd. Cessation advised.      For questions or updates, please contact [place] Please consult [email or web address] for contact info under        Signed, Angelique Blonder, DO  05/20/2022, 8:55 AM     ATTENDING ATTESTATION:  After conducting a review of all available clinical information with the care team, interviewing the patient, and performing a physical exam, I agree with the findings and plan described in this note.   GEN: No acute distress.   HEENT:  MMM, no JVD, no scleral icterus Cardiac: RRR, no murmurs, rubs, or gallops.  Respiratory: Clear to auscultation bilaterally. GI: Soft, nontender, non-distended  MS: No edema; No deformity. Neuro:  Nonfocal  Vasc:  +2 radial pulses  The patient is a 71 year old female with a history of previous remote stenting of the mid right coronary artery prior to 2006.  In 2006 she presented with an acute coronary syndrome.  She underwent stenting of her right posterior descending artery and staged PCI of the  proximal LAD.  I was able to review these films.  She has moderate disease of the diagonal and first obtuse  marginal as well; a stent is present in the mid right coronary artery.  She also has a history of hypertension, hyperlipidemia, and ongoing tobacco abuse.  She presented with an acute coronary syndrome with an elevated troponin.  Her EKG is reassuring with no acute ischemic changes.  Her echocardiogram demonstrates preserved LV function with moderate mitral regurgitation.  Her anginal symptoms consist of chest burning.  This is reminiscent of her presentation in 2006.  Given elevated troponin and previous history of coronary artery disease with stenting I think an invasive assessment is required.  She has a strong right radial pulse.  I have reviewed the risks, indications, and alternatives to cardiac catheterization, possible angioplasty, and stenting with the patient. Risks include but are not limited to bleeding, infection, vascular injury, stroke, myocardial infection, arrhythmia, kidney injury, radiation-related injury in the case of prolonged fluoroscopy use, emergency cardiac surgery, and death. The patient understands the risks of serious complication is 1-2 in 2449 with diagnostic cardiac cath and 1-2% or less with angioplasty/stenting.    Lenna Sciara, MD Pager [phone]

## 2022-05-20 NOTE — Interval H&P Note (Signed)
History and Physical Interval Note:  05/20/2022 11:56 AM  Elizabeth [name]  has presented today for surgery, with the diagnosis of chest pain.  The various methods of treatment have been discussed with the patient and family. After consideration of risks, benefits and other options for treatment, the patient has consented to  Procedure(s): LEFT HEART CATH AND CORONARY ANGIOGRAPHY (N/A) as a surgical intervention.  The patient's history has been reviewed, patient examined, no change in status, stable for surgery.  I have reviewed the patient's chart and labs.  Questions were answered to the patient's satisfaction.    Cath Lab Visit (complete for each Cath Lab visit)  Clinical Evaluation Leading to the Procedure:   ACS: Yes.    Non-ACS:    Anginal Classification: CCS III  Anti-ischemic medical therapy: Maximal Therapy (2 or more classes of medications)  Non-Invasive Test Results: No non-invasive testing performed  Prior CABG: No previous CABG        [name]

## 2022-05-21 ENCOUNTER — Other Ambulatory Visit (HOSPITAL_COMMUNITY): Payer: Self-pay

## 2022-05-21 ENCOUNTER — Encounter (HOSPITAL_COMMUNITY): Payer: Self-pay | Admitting: Cardiovascular Disease

## 2022-05-21 ENCOUNTER — Telehealth (HOSPITAL_COMMUNITY): Payer: Self-pay | Admitting: Pharmacy Technician

## 2022-05-21 LAB — LIPOPROTEIN A (LPA): Lipoprotein (a): 184.1 nmol/L — ABNORMAL HIGH (ref <75.0)

## 2022-05-21 NOTE — Progress Notes (Addendum)
Rounding Note    Patient Name: Elizabeth [name] Date of Encounter: 05/21/2022  [place] Cardiologist: Phineas Inches, MD  Subjective   She reports feeling well. No chest pain or shortness of breath this morning. She felt short of breath last night which she believed to have started after taking ticagrelor. Supplemental O2 and caffeine given. Dyspnea has resolved, on 2L O2 for comfort.   Inpatient Medications    Scheduled Meds:  amLODipine  2.5 mg Oral Daily   aspirin EC  81 mg Oral Daily   atorvastatin  80 mg Oral q1800   cyanocobalamin  1,000 mcg Oral Daily   enoxaparin (LOVENOX) injection  40 mg Subcutaneous Q24H   escitalopram  10 mg Oral Daily   ezetimibe  10 mg Oral Daily   isosorbide mononitrate  30 mg Oral Daily   metoprolol tartrate  50 mg Oral BID   nitroGLYCERIN  0.4 mg Sublingual Once   sodium chloride flush  3 mL Intravenous Q12H   sodium chloride flush  3 mL Intravenous Q12H   ticagrelor  90 mg Oral BID   Continuous Infusions:  sodium chloride     PRN Meds: sodium chloride, acetaminophen, albuterol, nitroGLYCERIN, ondansetron (ZOFRAN) IV, mouth rinse, sodium chloride flush   Vital Signs    Vitals:   05/20/22 1900 05/20/22 1939 05/20/22 2330 05/21/22 0343  BP: (!) 152/87 (!) 141/89 125/74 111/66  Pulse: 68 68  72  Resp: '20 20 16 18  '$ Temp: 98.3 F (36.8 C) 98.3 F (36.8 C) 98.3 F (36.8 C) 98.5 F (36.9 C)  TempSrc: Oral Oral Oral Oral  SpO2: 97% 98% 97% 97%  Weight:      Height:        Intake/Output Summary (Last 24 hours) at 05/21/2022 0725 Last data filed at 05/20/2022 2112 Gross per 24 hour  Intake 371.66 ml  Output --  Net 371.66 ml       05/19/2022    8:25 PM 05/15/2022   11:30 AM 07/06/2021    9:56 AM  Last 3 Weights  Weight (lbs) 123 lb 10.9 oz 126 lb 123 lb 12.8 oz  Weight (kg) 56.1 kg 57.153 kg 56.155 kg      Telemetry    NSR - Personally Reviewed.   ECG    NSR, rate 60, normal axis - Personally  Reviewed  Physical Exam  GEN: Alert, in no acute distress Neck: supple Cardiac: RRR, systolic murmur at apex  Respiratory: Normal work of breathing. Clear to auscultation bilaterally. On 2L [place] MS: No edema, no deformity Neuro:  alert and oriented x 3  Psych: Normal affect   Labs    High Sensitivity Troponin:   Recent Labs  Lab 05/19/22 1005 05/19/22 1225 05/20/22 0521  TROPONINIHS 42* 75* 119*      Chemistry Recent Labs  Lab 05/15/22 1208 05/19/22 1005 05/20/22 0521  NA 137 139 135  K 3.9 4.0 3.9  CL 102 107 106  CO2 '25 23 24  '$ GLUCOSE 98 123* 83  BUN 11 6* 9  CREATININE 0.88 0.88 0.96  CALCIUM 9.9 9.7 9.5  PROT 1.1*  --   --   ALBUMIN 4.0  --   --   AST 14  --   --   ALT 12  --   --   ALKPHOS 87  --   --   BILITOT 0.2  --   --   GFRNONAA  --  >60 >60  ANIONGAP  --  9  5     Lipids  Recent Labs  Lab 05/20/22 0521  CHOL 161  TRIG 53  HDL 50  LDLCALC 100*  CHOLHDL 3.2     Hematology Recent Labs  Lab 05/15/22 1208 05/19/22 1005  WBC 8.3 8.2  RBC 4.53 4.48  HGB 13.7 13.7  HCT 41.6 40.6  MCV 91.9 90.6  MCH  --  30.6  MCHC 33.0 33.7  RDW 15.5 15.8*  PLT 143.0* 155    Thyroid  Recent Labs  Lab 05/15/22 1208  TSH 2.07     BNP Recent Labs  Lab 05/19/22 1226  BNP 430.5*     DDimer No results for input(s): "DDIMER" in the last 168 hours.   Radiology    CARDIAC CATHETERIZATION  Result Date: 05/20/2022   Prox RCA to Mid RCA lesion is 60% stenosed.   Ost RCA to Prox RCA lesion is 20% stenosed.   Mid RCA to Dist RCA lesion is 50% stenosed.   Dist RCA lesion is 99% stenosed.   RPDA lesion is 99% stenosed.   RPAV lesion is 99% stenosed.   Mid Cx lesion is 80% stenosed.   1st Mrg lesion is 80% stenosed.   Ost LAD to Prox LAD lesion is 50% stenosed.   Mid LAD lesion is 20% stenosed.   2nd Diag lesion is 99% stenosed. Severe triple vessel CAD 2. Patent stent mid LAD.  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 Severe stenosis moderate caliber  diagonal branch in the mid segment. 3. Severe mid Circumflex stenosis. Severe ostial stenosis small caliber obtuse marginal branch 4. The RCA is a large dominant vessel. The entire vessel is heavily calcified. The mid stented segment is patent with moderate restenosis. The distal vessel has a heavily calcified severe stenosis. The PDA stent has diffuse severe restenosis. The posterolateral branch has severe, heavily calcified stenosis. 5. Normal LVEDP Recommendations: She has complex multivessel CAD. Will review options with IC team. I am not sure her target vessels are suitable for bypass. If we approached revascularization with PCI, we could consider treatment of the RCA which would be complex requiring orbital atherectomy and likely some difficulty delivering balloons/stents through the prior mid stented segment and heavily calcified mid vessel.   ECHOCARDIOGRAM COMPLETE  Result Date: 05/19/2022    ECHOCARDIOGRAM REPORT   Patient Name:   Elizabeth [name] Date of Exam: 05/19/2022 Medical Rec #:  [id number]      Height:       63.0 in Accession #:    [id number]     Weight:       126.0 lb Date of Birth:  [birth year]      BSA:          1.589 m Patient Age:    71 years       BP:           147/91 mmHg Patient Gender: F              HR:           72 bpm. Exam Location:  Inpatient Procedure: 2D Echo Indications:    murmur  History:        Patient has prior history of Echocardiogram examinations, most                 recent 05/21/2016. CAD, Signs/Symptoms:Chest Pain; Risk                 Factors:Hypertension, Dyslipidemia and Current Smoker.  Sonographer:  [place] Referring Phys: [id number] [place]  1. Left ventricular ejection fraction, by estimation, is 60 to 65%. The left ventricle has normal function. The left ventricle has no regional wall motion abnormalities. There is mild asymmetric left ventricular hypertrophy of the basal-septal segment. Left ventricular diastolic parameters are  indeterminate.  2. Right ventricular systolic function is normal. The right ventricular size is normal. There is normal pulmonary artery systolic pressure.  3. Anterior leaflet with leaflet tip calcification and subvalvular apparatus calcification. The mitral valve is abnormal. Mild to moderate mitral valve regurgitation. Moderate mitral annular calcification.  4. The aortic valve is tricuspid. Aortic valve regurgitation is not visualized. No aortic stenosis is present.  5. The inferior vena cava is normal in size with greater than 50% respiratory variability, suggesting right atrial pressure of 3 mmHg. Comparison(s): Changes from prior study are noted. Mitral valve with leaflet and subvalvular calcification as noted, mild-moderate MR. Conclusion(s)/Recommendation(s): Otherwise normal echocardiogram, with minor abnormalities described in the report. FINDINGS  Left Ventricle: Left ventricular ejection fraction, by estimation, is 60 to 65%. The left ventricle has normal function. The left ventricle has no regional wall motion abnormalities. The left ventricular internal cavity size was normal in size. There is  mild asymmetric left ventricular hypertrophy of the basal-septal segment. Left ventricular diastolic parameters are indeterminate. Right Ventricle: The right ventricular size is normal. Right vetricular wall thickness was not well visualized. Right ventricular systolic function is normal. There is normal pulmonary artery systolic pressure. The tricuspid regurgitant velocity is 1.94 m/s, and with an assumed right atrial pressure of 3 mmHg, the estimated right ventricular systolic pressure is 97.6 mmHg. Left Atrium: Left atrial size was normal in size. Right Atrium: Right atrial size was normal in size. Pericardium: There is no evidence of pericardial effusion. Presence of epicardial fat layer. Mitral Valve: Anterior leaflet with leaflet tip calcification and subvalvular apparatus calcification. The mitral valve  is abnormal. There is mild thickening of the mitral valve leaflet(s). There is moderate calcification of the mitral valve leaflet(s). Moderate mitral annular calcification. Mild to moderate mitral valve regurgitation. MV peak gradient, 14.3 mmHg. The mean mitral valve gradient is 5.0 mmHg. Tricuspid Valve: The tricuspid valve is normal in structure. Tricuspid valve regurgitation is trivial. No evidence of tricuspid stenosis. Aortic Valve: The aortic valve is tricuspid. Aortic valve regurgitation is not visualized. No aortic stenosis is present. Pulmonic Valve: The pulmonic valve was grossly normal. Pulmonic valve regurgitation is trivial. No evidence of pulmonic stenosis. Aorta: The aortic root, ascending aorta, aortic arch and descending aorta are all structurally normal, with no evidence of dilitation or obstruction. Venous: The inferior vena cava is normal in size with greater than 50% respiratory variability, suggesting right atrial pressure of 3 mmHg. IAS/Shunts: The atrial septum is grossly normal.  LEFT VENTRICLE PLAX 2D LVOT diam:     1.70 cm   Diastology LV SV:         57        LV e' medial:    5.44 cm/s LV SV Index:   36        LV E/e' medial:  19.9 LVOT Area:     2.27 cm  LV e' lateral:   6.42 cm/s                          LV E/e' lateral: 16.8  RIGHT VENTRICLE  IVC RV S prime:     12.70 cm/s  IVC diam: 1.00 cm TAPSE (M-mode): 1.8 cm LEFT ATRIUM             Index        RIGHT ATRIUM          Index LA Vol (A2C):   46.7 ml 29.39 ml/m  RA Area:     9.26 cm LA Vol (A4C):   43.9 ml 27.63 ml/m  RA Volume:   17.70 ml 11.14 ml/m LA Biplane Vol: 45.7 ml 28.76 ml/m  AORTIC VALVE LVOT Vmax:   131.00 cm/s LVOT Vmean:  81.900 cm/s LVOT VTI:    0.250 m  AORTA Ao Root diam: 3.20 cm Ao Asc diam:  3.20 cm MITRAL VALVE                  TRICUSPID VALVE MV Area (PHT): 2.01 cm       TR Peak grad:   15.1 mmHg MV Peak grad:  14.3 mmHg      TR Vmax:        194.00 cm/s MV Mean grad:  5.0 mmHg MV Vmax:        1.89 m/s       SHUNTS MV Vmean:      99.4 cm/s      Systemic VTI:  0.25 m MV Decel Time: 377 msec       Systemic Diam: 1.70 cm MR Peak grad:    132.2 mmHg MR Mean grad:    85.0 mmHg MR Vmax:         575.00 cm/s MR Vmean:        438.0 cm/s MR PISA:         1.57 cm MR PISA Eff ROA: 11 mm MR PISA Radius:  0.50 cm MV E velocity: 108.00 cm/s MV A velocity: 165.00 cm/s MV E/A ratio:  0.65 Buford Dresser MD Electronically signed by Buford Dresser MD Signature Date/Time: 05/19/2022/3:58:33 PM    Final    DG Chest 2 View  Result Date: 05/19/2022 CLINICAL DATA:  Chest pain, cough. EXAM: CHEST - 2 VIEW COMPARISON:  May 15, 2022. FINDINGS: Stable cardiomediastinal silhouette. Stable reticular opacities are noted throughout both lungs concerning for pulmonary edema. No pneumothorax or pleural effusion is noted. Bony thorax is unremarkable. IMPRESSION: Stable bilateral reticular opacities are noted most consistent with pulmonary edema. Electronically Signed   By: Marijo Conception M.D.   On: 05/19/2022 10:30    Cardiac Studies   Left Heart Cath and Coronary Angiography Severe triple vessel CAD 2. Patent stent mid LAD.  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 Severe stenosis moderate caliber diagonal branch in the mid segment.  3. Severe mid Circumflex stenosis. Severe ostial stenosis small caliber obtuse marginal branch 4. The RCA is a large dominant vessel. The entire vessel is heavily calcified. The mid stented segment is patent with moderate restenosis. The distal vessel has a heavily calcified severe stenosis. The PDA stent has diffuse severe restenosis. The posterolateral branch has severe, heavily calcified stenosis.  5. Normal LVEDP  Patient Profile     71 y.o. female with a hx of CAD (s/p PTCA to RCA and LCx in 2001, STEMI in 2006 with DES to RCA and 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), HTN, HLD, tobacco use and prior CVA who is being seen 05/20/2022 for the evaluation of chest pain at the request of Dr.  Ronnald Nian.  Assessment & Plan    1. Chest Pain  concerning for Accelerating Angina Patient denies chest pain or shortness of breath today. Cardiac cath revealed severe triple vessel CAD with restenosis in her previous stent.  - on ticagrelor 90 mg BID - plan for coronary atherectomy tomorrow, will be NPO at midnight   2. CAD She is s/p PTCA to RCA and LCx in 2001 with STEMI in 2006 with DES to RCA and 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 at that time as well. - Remains on ASA 81 mg daily, Atorvastatin '80mg'$  daily, Zetia '10mg'$  daily and Lopressor '50mg'$  BID.   3. Systolic Murmur She does have a prominent murmur on examination. Had mild MR by echo in 2017. Repeat echo showed mild-moderate MR.   4. HTN BP well controlled this morning.  - Lopressor '50mg'$  BID, Amlodipine 2.'5mg'$  daily, Imdur 30 mg daily.    5. HLD Followed by her PCP as an outpatient. Remains on Atorvastatin '80mg'$  daily and Zetia '10mg'$  daily. LDL 100, Lp (a) 184.1.  - consider triple therapy for better control   6. Tobacco Use - She continues to smoke 0.5 ppd. Cessation advised.      For questions or updates, please contact [place] Please consult [email or web address] for contact info under        Signed, Angelique Blonder, DO  05/21/2022, 7:25 AM      ATTENDING ATTESTATION:  After conducting a review of all available clinical information with the care team, interviewing the patient, and performing a physical exam, I agree with the findings and plan described in this note.   GEN: No acute distress.   HEENT:  MMM, no JVD, no scleral icterus Cardiac: RRR, soft systolic murmur Respiratory: Clear to auscultation bilaterally. GI: Soft, nontender, non-distended  MS: No edema; No deformity. Neuro:  Nonfocal  Vasc:  +2 radial pulses; R radial dressing in place  The patient underwent coronary angiography yesterday which demonstrated highly calcified distal right coronary artery lesion, high-grade in-stent restenosis of the previously placed  PDA stent, moderate in-stent restenosis of the mid right coronary artery stent, possible high-grade lesion of the RPLV branch, high-grade diagonal disease and high-grade circumflex disease as well as OM1 disease.  Given the lack of significant LAD disease I do not think CABG is in the patient's best interest.  We will refer for PCI of the right coronary artery tomorrow with plan medical therapy for the other lesions and if needed PCI in the future.  She was loaded with ticagrelor yesterday and did develop mild dyspnea.  Continue ticagrelor for now.  NPO at midnight for planned procedure tomorrow.  I did discuss the risks and benefits of this approach with patient agrees to the procedure.  Lenna Sciara, MD Pager [phone]

## 2022-05-21 NOTE — Telephone Encounter (Signed)
Pharmacy Patient Advocate Encounter  Insurance verification completed.    The patient does not have any prescription insurance coverage  The patient is currently admitted and ran test claims for the following: Brilinta.  Copays and coinsurance results were relayed to Inpatient clinical team.

## 2022-05-22 ENCOUNTER — Encounter (HOSPITAL_COMMUNITY): Payer: Self-pay | Admitting: Internal Medicine

## 2022-05-22 ENCOUNTER — Encounter (HOSPITAL_COMMUNITY)
Admission: EM | Disposition: A | Discharge status: Home / Self Care | Payer: Self-pay | Source: Home / Self Care | Attending: Internal Medicine

## 2022-05-22 HISTORY — PX: CORONARY STENT INTERVENTION: CATH118234

## 2022-05-22 HISTORY — PX: CORONARY ATHERECTOMY: CATH118238

## 2022-05-22 LAB — CBC
HCT: 35.3 % — ABNORMAL LOW (ref 36.0–46.0)
Hemoglobin: 12.6 g/dL (ref 12.0–15.0)
MCH: 30.8 pg (ref 26.0–34.0)
MCHC: 35.7 g/dL (ref 30.0–36.0)
MCV: 86.3 fL (ref 80.0–100.0)
Platelets: 138 10*3/uL — ABNORMAL LOW (ref 150–400)
RBC: 4.09 MIL/uL (ref 3.87–5.11)
RDW: 15 % (ref 11.5–15.5)
WBC: 7.2 10*3/uL (ref 4.0–10.5)
nRBC: 0 % (ref 0.0–0.2)

## 2022-05-22 LAB — BASIC METABOLIC PANEL
Anion gap: 5 (ref 5–15)
BUN: 13 mg/dL (ref 8–23)
CO2: 22 mmol/L (ref 22–32)
Calcium: 9.5 mg/dL (ref 8.9–10.3)
Chloride: 110 mmol/L (ref 98–111)
Creatinine, Ser: 0.94 mg/dL (ref 0.44–1.00)
GFR, Estimated: >60 mL/min (ref >60)
Glucose, Bld: 91 mg/dL (ref 70–99)
Potassium: 3.9 mmol/L (ref 3.5–5.1)
Sodium: 137 mmol/L (ref 135–145)

## 2022-05-22 LAB — POCT ACTIVATED CLOTTING TIME
Activated Clotting Time: 414 seconds
Activated Clotting Time: 299 seconds

## 2022-05-22 SURGERY — CORONARY ATHERECTOMY
Anesthesia: LOCAL

## 2022-05-22 MED ORDER — HEPARIN SODIUM (PORCINE) 1000 UNIT/ML IJ SOLN
INTRAMUSCULAR | Status: DC | PRN
  Administered 2022-05-22: 3000 [IU] via INTRAVENOUS
  Administered 2022-05-22: 8000 [IU] via INTRAVENOUS

## 2022-05-22 MED ORDER — NOREPINEPHRINE BITARTRATE 1 MG/ML IV SOLN
INTRAVENOUS | Status: DC | PRN
  Administered 2022-05-22 (×2): 2 ug/min via INTRAVENOUS

## 2022-05-22 MED ORDER — SODIUM CHLORIDE 0.9 % IV SOLN
5.0000 mg/kg | Freq: Once | INTRAVENOUS | Status: AC
  Administered 2022-05-22: 280.5 mg via INTRAVENOUS
  Filled 2022-05-22: qty 11.22

## 2022-05-22 MED ORDER — LIDOCAINE HCL (PF) 1 % IJ SOLN
INTRAMUSCULAR | Status: AC
  Filled 2022-05-22: qty 30

## 2022-05-22 MED ORDER — MIDAZOLAM HCL 2 MG/2ML IJ SOLN
INTRAMUSCULAR | Status: AC
  Filled 2022-05-22: qty 2

## 2022-05-22 MED ORDER — LABETALOL HCL 5 MG/ML IV SOLN: 10.0000 mg | INTRAVENOUS | Status: AC | PRN

## 2022-05-22 MED ORDER — SODIUM CHLORIDE 0.9% FLUSH
3.0000 mL | Freq: Two times a day (BID) | INTRAVENOUS | Status: DC
Start: 2022-05-22 — End: 2022-05-23
  Administered 2022-05-22 – 2022-05-23 (×2): 3 mL via INTRAVENOUS

## 2022-05-22 MED ORDER — ASPIRIN 81 MG PO CHEW
81.0000 mg | CHEWABLE_TABLET | ORAL | Status: AC
  Administered 2022-05-22: 81 mg via ORAL
  Filled 2022-05-22: qty 1

## 2022-05-22 MED ORDER — VERAPAMIL HCL 2.5 MG/ML IV SOLN
INTRAVENOUS | Status: DC | PRN
  Administered 2022-05-22: 10 mL via INTRA_ARTERIAL

## 2022-05-22 MED ORDER — SODIUM CHLORIDE 0.9% FLUSH: 3.0000 mL | INTRAVENOUS | Status: DC | PRN

## 2022-05-22 MED ORDER — VIPERSLIDE LUBRICANT OPTIME
TOPICAL | Status: DC | PRN
  Administered 2022-05-22: 20 mL via SURGICAL_CAVITY

## 2022-05-22 MED ORDER — HEPARIN (PORCINE) IN NACL 1000-0.9 UT/500ML-% IV SOLN
INTRAVENOUS | Status: DC | PRN
  Administered 2022-05-22 (×2): 500 mL

## 2022-05-22 MED ORDER — SODIUM CHLORIDE 0.9 % IV SOLN: 250.0000 mL | INTRAVENOUS | Status: DC | PRN

## 2022-05-22 MED ORDER — HEPARIN (PORCINE) IN NACL 1000-0.9 UT/500ML-% IV SOLN
INTRAVENOUS | Status: AC
  Filled 2022-05-22: qty 1000

## 2022-05-22 MED ORDER — SODIUM CHLORIDE 0.9 % IV SOLN
INTRAVENOUS | Status: AC | PRN
  Administered 2022-05-22: 60 mL via INTRAVENOUS
  Administered 2022-05-22: 250 mL via INTRAVENOUS

## 2022-05-22 MED ORDER — FENTANYL CITRATE (PF) 100 MCG/2ML IJ SOLN
INTRAMUSCULAR | Status: DC | PRN
  Administered 2022-05-22 (×3): 25 ug via INTRAVENOUS

## 2022-05-22 MED ORDER — SODIUM CHLORIDE 0.9 % IV SOLN: INTRAVENOUS | Status: AC

## 2022-05-22 MED ORDER — SODIUM CHLORIDE 0.9 % WEIGHT BASED INFUSION
1.0000 mL/kg/h | INTRAVENOUS | Status: DC
  Administered 2022-05-22: 1 mL/kg/h via INTRAVENOUS

## 2022-05-22 MED ORDER — SODIUM CHLORIDE 0.9% FLUSH: 3.0000 mL | Freq: Two times a day (BID) | INTRAVENOUS | Status: DC

## 2022-05-22 MED ORDER — FENTANYL CITRATE (PF) 100 MCG/2ML IJ SOLN
INTRAMUSCULAR | Status: AC
  Filled 2022-05-22: qty 2

## 2022-05-22 MED ORDER — SODIUM CHLORIDE 0.9 % WEIGHT BASED INFUSION
3.0000 mL/kg/h | INTRAVENOUS | Status: DC
  Administered 2022-05-22: 3 mL/kg/h via INTRAVENOUS

## 2022-05-22 MED ORDER — MIDAZOLAM HCL 2 MG/2ML IJ SOLN
INTRAMUSCULAR | Status: DC | PRN
  Administered 2022-05-22 (×3): 1 mg via INTRAVENOUS

## 2022-05-22 MED ORDER — HEPARIN SODIUM (PORCINE) 1000 UNIT/ML IJ SOLN
INTRAMUSCULAR | Status: AC
  Filled 2022-05-22: qty 10

## 2022-05-22 MED ORDER — LIDOCAINE HCL (PF) 1 % IJ SOLN
INTRAMUSCULAR | Status: DC | PRN
  Administered 2022-05-22: 2 mL via INTRADERMAL

## 2022-05-22 MED ORDER — HEPARIN SODIUM (PORCINE) 1000 UNIT/ML IJ SOLN
INTRAMUSCULAR | Status: AC
Start: 2022-05-22 — End: ?
  Filled 2022-05-22: qty 10

## 2022-05-22 MED ORDER — VERAPAMIL HCL 2.5 MG/ML IV SOLN
INTRAVENOUS | Status: AC
  Filled 2022-05-22: qty 2

## 2022-05-22 MED ORDER — IOHEXOL 350 MG/ML SOLN
INTRAVENOUS | Status: DC | PRN
  Administered 2022-05-22: 95 mL

## 2022-05-22 MED ORDER — HYDRALAZINE HCL 20 MG/ML IJ SOLN: 10.0000 mg | INTRAMUSCULAR | Status: AC | PRN

## 2022-05-22 MED ORDER — ENOXAPARIN SODIUM 40 MG/0.4ML IJ SOSY
40.0000 mg | PREFILLED_SYRINGE | INTRAMUSCULAR | Status: DC
  Administered 2022-05-23: 40 mg via SUBCUTANEOUS
  Filled 2022-05-22: qty 0.4

## 2022-05-22 MED ORDER — NOREPINEPHRINE 4 MG/250ML-% IV SOLN
INTRAVENOUS | Status: AC
  Filled 2022-05-22: qty 250

## 2022-05-22 SURGICAL SUPPLY — 29 items
BALL SAPPHIRE NC24 2.75X8 (BALLOONS) ×1
BALLN SAPPHIRE 2.0X10 (BALLOONS) ×1
BALLN SAPPHIRE 2.0X12 (BALLOONS) ×1
BALLOON SAPPHIRE 2.0X10 (BALLOONS) IMPLANT
BALLOON SAPPHIRE 2.0X12 (BALLOONS) IMPLANT
BALLOON SAPPHIRE NC24 2.75X8 (BALLOONS) IMPLANT
CATH TELEPORT (CATHETERS) IMPLANT
CATH TELESCOPE 6F GEC (CATHETERS) IMPLANT
CROWN DIAMONDBACK CLASSIC 1.25 (BURR) IMPLANT
DEVICE RAD COMP TR BAND LRG (VASCULAR PRODUCTS) IMPLANT
ELECT DEFIB PAD ADLT CADENCE (PAD) IMPLANT
GLIDESHEATH SLEND SS 6F .021 (SHEATH) IMPLANT
GLIDESHEATH SLENDER 7FR .021G (SHEATH) IMPLANT
GUIDE CATH MACH 1 7F AL.75 (CATHETERS) IMPLANT
GUIDEWIRE INQWIRE 1.5J.035X260 (WIRE) IMPLANT
INQWIRE 1.5J .035X260CM (WIRE) ×1
KIT ENCORE 26 ADVANTAGE (KITS) IMPLANT
KIT HEART LEFT (KITS) ×2 IMPLANT
LUBRICANT VIPERSLIDE CORONARY (MISCELLANEOUS) IMPLANT
PACK CARDIAC CATHETERIZATION (CUSTOM PROCEDURE TRAY) ×2 IMPLANT
STENT ONYX FRONTIER 2.25X08 (Permanent Stent) IMPLANT
STENT ONYX FRONTIER 2.5X12 (Permanent Stent) IMPLANT
TRANSDUCER W/STOPCOCK (MISCELLANEOUS) ×2 IMPLANT
TUBING CIL FLEX 10 FLL-RA (TUBING) ×2 IMPLANT
WIRE COUGAR XT STRL 190CM (WIRE) IMPLANT
WIRE COUGAR XT STRL 300CM (WIRE) IMPLANT
WIRE DOC EXTENSION .014X145CM (WIRE) IMPLANT
WIRE HI TORQ VERSACORE-J 145CM (WIRE) IMPLANT
WIRE VIPERWIRE COR FLEX .012 (WIRE) IMPLANT

## 2022-05-22 NOTE — Progress Notes (Addendum)
Rounding Note    Patient Name: Davin Muramoto Date of Encounter: 05/22/2022  [facility] Cardiologist: Phineas Inches, MD  Subjective   She has no new concerns. No chest pain or shortness of breath this morning. No longer on O2 supplementation.   Inpatient Medications    Scheduled Meds:  amLODipine  2.5 mg Oral Daily   aspirin EC  81 mg Oral Daily   atorvastatin  80 mg Oral q1800   cyanocobalamin  1,000 mcg Oral Daily   enoxaparin (LOVENOX) injection  40 mg Subcutaneous Q24H   escitalopram  10 mg Oral Daily   ezetimibe  10 mg Oral Daily   isosorbide mononitrate  30 mg Oral Daily   metoprolol tartrate  50 mg Oral BID   nitroGLYCERIN  0.4 mg Sublingual Once   sodium chloride flush  3 mL Intravenous Q12H   sodium chloride flush  3 mL Intravenous Q12H   sodium chloride flush  3 mL Intravenous Q12H   ticagrelor  90 mg Oral BID   Continuous Infusions:  sodium chloride     sodium chloride     sodium chloride 1 mL/kg/hr (05/22/22 0651)   PRN Meds: sodium chloride, sodium chloride, acetaminophen, albuterol, nitroGLYCERIN, ondansetron (ZOFRAN) IV, mouth rinse, sodium chloride flush, sodium chloride flush   Vital Signs    Vitals:   05/21/22 2019 05/21/22 2146 05/21/22 2336 05/22/22 0403  BP: 116/78  108/71 121/70  Pulse: (!) 58 73 (!) 55 63  Resp: '19  20 20  '$ Temp: 98.1 F (36.7 C)  98 F (36.7 C) 98.4 F (36.9 C)  TempSrc: Oral  Oral Oral  SpO2: 93%  95% 94%  Weight:      Height:        Intake/Output Summary (Last 24 hours) at 05/22/2022 0802 Last data filed at 05/22/2022 0407 Gross per 24 hour  Intake --  Output 800 ml  Net -800 ml      05/19/2022    8:25 PM 05/15/2022   11:30 AM 07/06/2021    9:56 AM  Last 3 Weights  Weight (lbs) 123 lb 10.9 oz 126 lb 123 lb 12.8 oz  Weight (kg) 56.1 kg 57.153 kg 56.155 kg      Telemetry    NSR - Personally Reviewed.   ECG    NA - Personally Reviewed  Physical Exam  GEN: Alert, in no acute distress Neck:  supple Cardiac: RRR, systolic murmur at apex  Respiratory: Normal work of breathing. Clear to auscultation bilaterally. MS: No edema, no deformity Neuro:  alert and oriented x 3  Psych: Normal affect   Labs    High Sensitivity Troponin:   Recent Labs  Lab 05/19/22 1005 05/19/22 1225 05/20/22 0521  TROPONINIHS 42* 75* 119*     Chemistry Recent Labs  Lab 05/15/22 1208 05/19/22 1005 05/20/22 0521 05/22/22 0548  NA 137 139 135 137  K 3.9 4.0 3.9 3.9  CL 102 107 106 110  CO2 '25 23 24 22  '$ GLUCOSE 98 123* 83 91  BUN 11 6* 9 13  CREATININE 0.88 0.88 0.96 0.94  CALCIUM 9.9 9.7 9.5 9.5  PROT 1.1*  --   --   --   ALBUMIN 4.0  --   --   --   AST 14  --   --   --   ALT 12  --   --   --   ALKPHOS 87  --   --   --   BILITOT 0.2  --   --   --  GFRNONAA  --  >60 >60 >60  ANIONGAP  --  '9 5 5    '$ Lipids  Recent Labs  Lab 05/20/22 0521  CHOL 161  TRIG 53  HDL 50  LDLCALC 100*  CHOLHDL 3.2    Hematology Recent Labs  Lab 05/15/22 1208 05/19/22 1005 05/22/22 0548  WBC 8.3 8.2 7.2  RBC 4.53 4.48 4.09  HGB 13.7 13.7 12.6  HCT 41.6 40.6 35.3*  MCV 91.9 90.6 86.3  MCH  --  30.6 30.8  MCHC 33.0 33.7 35.7  RDW 15.5 15.8* 15.0  PLT 143.0* 155 138*   Thyroid  Recent Labs  Lab 05/15/22 1208  TSH 2.07    BNP Recent Labs  Lab 05/19/22 1226  BNP 430.5*    DDimer No results for input(s): "DDIMER" in the last 168 hours.   Radiology    CARDIAC CATHETERIZATION  Result Date: 05/20/2022   Prox RCA to Mid RCA lesion is 60% stenosed.   Ost RCA to Prox RCA lesion is 20% stenosed.   Mid RCA to Dist RCA lesion is 50% stenosed.   Dist RCA lesion is 99% stenosed.   RPDA lesion is 99% stenosed.   RPAV lesion is 99% stenosed.   Mid Cx lesion is 80% stenosed.   1st Mrg lesion is 80% stenosed.   Ost LAD to Prox LAD lesion is 50% stenosed.   Mid LAD lesion is 20% stenosed.   2nd Diag lesion is 99% stenosed. Severe triple vessel CAD 2. Patent stent mid LAD.  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 Severe stenosis moderate caliber diagonal branch in the mid segment. 3. Severe mid Circumflex stenosis. Severe ostial stenosis small caliber obtuse marginal branch 4. The RCA is a large dominant vessel. The entire vessel is heavily calcified. The mid stented segment is patent with moderate restenosis. The distal vessel has a heavily calcified severe stenosis. The PDA stent has diffuse severe restenosis. The posterolateral branch has severe, heavily calcified stenosis. 5. Normal LVEDP Recommendations: She has complex multivessel CAD. Will review options with IC team. I am not sure her target vessels are suitable for bypass. If we approached revascularization with PCI, we could consider treatment of the RCA which would be complex requiring orbital atherectomy and likely some difficulty delivering balloons/stents through the prior mid stented segment and heavily calcified mid vessel.    Cardiac Studies   Left Heart Cath and Coronary Angiography Severe triple vessel CAD 2. Patent stent mid LAD.  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 Severe stenosis moderate caliber diagonal branch in the mid segment.  3. Severe mid Circumflex stenosis. Severe ostial stenosis small caliber obtuse marginal branch 4. The RCA is a large dominant vessel. The entire vessel is heavily calcified. The mid stented segment is patent with moderate restenosis. The distal vessel has a heavily calcified severe stenosis. The PDA stent has diffuse severe restenosis. The posterolateral branch has severe, heavily calcified stenosis.  5. Normal LVEDP  Patient Profile     71 y.o. female with a hx of CAD (s/p PTCA to RCA and LCx in 2001, STEMI in 2006 with DES to RCA and 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), HTN, HLD, tobacco use and prior CVA who is being seen 05/20/2022 for the evaluation of chest pain at the request of Dr. Ronnald Nian.  Assessment & Plan    1. Chest Pain concerning for Accelerating Angina - Plan for coronary atherectomy  this afternoon, currently NPO - on ticagrelor 90 mg BID   2.  CAD She is s/p PTCA to RCA and LCx in 2001 with STEMI in 2006 with DES to RCA and 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 at that time as well. - Remains on ASA 81 mg daily, Atorvastatin '80mg'$  daily, Zetia '10mg'$  daily and Lopressor '50mg'$  BID.   3. Systolic Murmur She does have a prominent murmur on examination. Had mild MR by echo in 2017. Repeat echo showed mild-moderate MR.   4. HTN BP continues to be well-controlled.  - Lopressor '50mg'$  BID, Amlodipine 2.'5mg'$  daily, Imdur 30 mg daily.    5. HLD - Followed by her PCP as an outpatient. LDL 100, Lp (a) 184.1. Remains on Atorvastatin '80mg'$  daily and Zetia '10mg'$  daily.    6. Tobacco Use - She continues to smoke 0.5 ppd. Cessation advised.      For questions or updates, please contact [place] Please consult [email or web address] for contact info under        Signed, Angelique Blonder, DO  05/22/2022, 8:02 AM     ATTENDING ATTESTATION:  After conducting a review of all available clinical information with the care team, interviewing the patient, and performing a physical exam, I agree with the findings and plan described in this note.   GEN: No acute distress.   HEENT:  MMM, no JVD, no scleral icterus Cardiac: RRR, no murmurs, rubs, or gallops.  Respiratory: Clear to auscultation bilaterally. GI: Soft, nontender, non-distended  MS: No edema; No deformity. Neuro:  Nonfocal  Vasc:  +2 radial pulses; R radial dressing in place  Patient remains stable overnight awaiting planned PCI with atherectomy later today.  Her dyspnea (thought 2/2 to ticagrelor) is somewhat improved.  If she cannot tolerate ticagrelor, will have to consider conversion to plavix given her history of CVA.  I did speak with her regarding the risks and benefits of the planned procedure today and she agrees with the plan.  I have reviewed the risks, indications, and alternatives to cardiac catheterization, possible angioplasty, and  stenting with the patient. Risks include but are not limited to bleeding, infection, vascular injury, stroke, myocardial infection, arrhythmia, kidney injury, radiation-related injury in the case of prolonged fluoroscopy use, emergency cardiac surgery, and death. The patient understands the risks of serious complication is 1-2 in 2233 with diagnostic cardiac cath and 1-2% or less with angioplasty/stenting.    Lenna Sciara, MD Pager [phone]

## 2022-05-22 NOTE — Plan of Care (Signed)
  Problem: Cardiac: Goal: Ability to achieve and maintain adequate cardiovascular perfusion will improve Outcome: Progressing   Problem: Health Behavior/Discharge Planning: Goal: Ability to safely manage health-related needs after discharge will improve Outcome: Progressing   Problem: Education: Goal: Knowledge of General Education information will improve Description: Including pain rating scale, medication(s)/side effects and non-pharmacologic comfort measures Outcome: Progressing   Problem: Skin Integrity: Goal: Risk for impaired skin integrity will decrease Outcome: Progressing   Problem: Cardiovascular: Goal: Ability to achieve and maintain adequate cardiovascular perfusion will improve Outcome: Progressing Goal: Vascular access site(s) Level 0-1 will be maintained Outcome: Progressing

## 2022-05-22 NOTE — Interval H&P Note (Signed)
History and Physical Interval Note:  05/22/2022 2:43 PM  Elizabeth [name]  has presented today for surgery, with the diagnosis of cad.  The various methods of treatment have been discussed with the patient and family. After consideration of risks, benefits and other options for treatment, the patient has consented to  Procedure(s): CORONARY ATHERECTOMY (N/A) as a surgical intervention.  The patient's history has been reviewed, patient examined, no change in status, stable for surgery.  I have reviewed the patient's chart and labs.  Questions were answered to the patient's satisfaction.    Cath Lab Visit (complete for each Cath Lab visit)  Clinical Evaluation Leading to the Procedure:   ACS: Yes.    Non-ACS:    Anginal Classification: CCS III  Anti-ischemic medical therapy: Maximal Therapy (2 or more classes of medications)  Non-Invasive Test Results: No non-invasive testing performed  Prior CABG: No previous CABG        [name]

## 2022-05-22 NOTE — H&P (View-Only) (Signed)
Rounding Note    Patient Name: Elizabeth [name] Date of Encounter: 05/22/2022  [facility] Cardiologist: Phineas Inches, MD  Subjective   She has no new concerns. No chest pain or shortness of breath this morning. No longer on O2 supplementation.   Inpatient Medications    Scheduled Meds:  amLODipine  2.5 mg Oral Daily   aspirin EC  81 mg Oral Daily   atorvastatin  80 mg Oral q1800   cyanocobalamin  1,000 mcg Oral Daily   enoxaparin (LOVENOX) injection  40 mg Subcutaneous Q24H   escitalopram  10 mg Oral Daily   ezetimibe  10 mg Oral Daily   isosorbide mononitrate  30 mg Oral Daily   metoprolol tartrate  50 mg Oral BID   nitroGLYCERIN  0.4 mg Sublingual Once   sodium chloride flush  3 mL Intravenous Q12H   sodium chloride flush  3 mL Intravenous Q12H   sodium chloride flush  3 mL Intravenous Q12H   ticagrelor  90 mg Oral BID   Continuous Infusions:  sodium chloride     sodium chloride     sodium chloride 1 mL/kg/hr (05/22/22 0651)   PRN Meds: sodium chloride, sodium chloride, acetaminophen, albuterol, nitroGLYCERIN, ondansetron (ZOFRAN) IV, mouth rinse, sodium chloride flush, sodium chloride flush   Vital Signs    Vitals:   05/21/22 2019 05/21/22 2146 05/21/22 2336 05/22/22 0403  BP: 116/78  108/71 121/70  Pulse: (!) 58 73 (!) 55 63  Resp: '19  20 20  '$ Temp: 98.1 F (36.7 C)  98 F (36.7 C) 98.4 F (36.9 C)  TempSrc: Oral  Oral Oral  SpO2: 93%  95% 94%  Weight:      Height:        Intake/Output Summary (Last 24 hours) at 05/22/2022 0802 Last data filed at 05/22/2022 0407 Gross per 24 hour  Intake --  Output 800 ml  Net -800 ml      05/19/2022    8:25 PM 05/15/2022   11:30 AM 07/06/2021    9:56 AM  Last 3 Weights  Weight (lbs) 123 lb 10.9 oz 126 lb 123 lb 12.8 oz  Weight (kg) 56.1 kg 57.153 kg 56.155 kg      Telemetry    NSR - Personally Reviewed.   ECG    NA - Personally Reviewed  Physical Exam  GEN: Alert, in no acute distress Neck:  supple Cardiac: RRR, systolic murmur at apex  Respiratory: Normal work of breathing. Clear to auscultation bilaterally. MS: No edema, no deformity Neuro:  alert and oriented x 3  Psych: Normal affect   Labs    High Sensitivity Troponin:   Recent Labs  Lab 05/19/22 1005 05/19/22 1225 05/20/22 0521  TROPONINIHS 42* 75* 119*     Chemistry Recent Labs  Lab 05/15/22 1208 05/19/22 1005 05/20/22 0521 05/22/22 0548  NA 137 139 135 137  K 3.9 4.0 3.9 3.9  CL 102 107 106 110  CO2 '25 23 24 22  '$ GLUCOSE 98 123* 83 91  BUN 11 6* 9 13  CREATININE 0.88 0.88 0.96 0.94  CALCIUM 9.9 9.7 9.5 9.5  PROT 1.1*  --   --   --   ALBUMIN 4.0  --   --   --   AST 14  --   --   --   ALT 12  --   --   --   ALKPHOS 87  --   --   --   BILITOT 0.2  --   --   --  GFRNONAA  --  >60 >60 >60  ANIONGAP  --  '9 5 5    '$ Lipids  Recent Labs  Lab 05/20/22 0521  CHOL 161  TRIG 53  HDL 50  LDLCALC 100*  CHOLHDL 3.2    Hematology Recent Labs  Lab 05/15/22 1208 05/19/22 1005 05/22/22 0548  WBC 8.3 8.2 7.2  RBC 4.53 4.48 4.09  HGB 13.7 13.7 12.6  HCT 41.6 40.6 35.3*  MCV 91.9 90.6 86.3  MCH  --  30.6 30.8  MCHC 33.0 33.7 35.7  RDW 15.5 15.8* 15.0  PLT 143.0* 155 138*   Thyroid  Recent Labs  Lab 05/15/22 1208  TSH 2.07    BNP Recent Labs  Lab 05/19/22 1226  BNP 430.5*    DDimer No results for input(s): "DDIMER" in the last 168 hours.   Radiology    CARDIAC CATHETERIZATION  Result Date: 05/20/2022   Prox RCA to Mid RCA lesion is 60% stenosed.   Ost RCA to Prox RCA lesion is 20% stenosed.   Mid RCA to Dist RCA lesion is 50% stenosed.   Dist RCA lesion is 99% stenosed.   RPDA lesion is 99% stenosed.   RPAV lesion is 99% stenosed.   Mid Cx lesion is 80% stenosed.   1st Mrg lesion is 80% stenosed.   Ost LAD to Prox LAD lesion is 50% stenosed.   Mid LAD lesion is 20% stenosed.   2nd Diag lesion is 99% stenosed. Severe triple vessel CAD 2. Patent stent mid LAD.  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 Severe stenosis moderate caliber diagonal branch in the mid segment. 3. Severe mid Circumflex stenosis. Severe ostial stenosis small caliber obtuse marginal branch 4. The RCA is a large dominant vessel. The entire vessel is heavily calcified. The mid stented segment is patent with moderate restenosis. The distal vessel has a heavily calcified severe stenosis. The PDA stent has diffuse severe restenosis. The posterolateral branch has severe, heavily calcified stenosis. 5. Normal LVEDP Recommendations: She has complex multivessel CAD. Will review options with IC team. I am not sure her target vessels are suitable for bypass. If we approached revascularization with PCI, we could consider treatment of the RCA which would be complex requiring orbital atherectomy and likely some difficulty delivering balloons/stents through the prior mid stented segment and heavily calcified mid vessel.    Cardiac Studies   Left Heart Cath and Coronary Angiography Severe triple vessel CAD 2. Patent stent mid LAD.  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 Severe stenosis moderate caliber diagonal branch in the mid segment.  3. Severe mid Circumflex stenosis. Severe ostial stenosis small caliber obtuse marginal branch 4. The RCA is a large dominant vessel. The entire vessel is heavily calcified. The mid stented segment is patent with moderate restenosis. The distal vessel has a heavily calcified severe stenosis. The PDA stent has diffuse severe restenosis. The posterolateral branch has severe, heavily calcified stenosis.  5. Normal LVEDP  Patient Profile     71 y.o. female with a hx of CAD (s/p PTCA to RCA and LCx in 2001, STEMI in 2006 with DES to RCA and 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), HTN, HLD, tobacco use and prior CVA who is being seen 05/20/2022 for the evaluation of chest pain at the request of Dr. Ronnald Nian.  Assessment & Plan    1. Chest Pain concerning for Accelerating Angina - Plan for coronary atherectomy  this afternoon, currently NPO - on ticagrelor 90 mg BID   2.  CAD She is s/p PTCA to RCA and LCx in 2001 with STEMI in 2006 with DES to RCA and 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 at that time as well. - Remains on ASA 81 mg daily, Atorvastatin '80mg'$  daily, Zetia '10mg'$  daily and Lopressor '50mg'$  BID.   3. Systolic Murmur She does have a prominent murmur on examination. Had mild MR by echo in 2017. Repeat echo showed mild-moderate MR.   4. HTN BP continues to be well-controlled.  - Lopressor '50mg'$  BID, Amlodipine 2.'5mg'$  daily, Imdur 30 mg daily.    5. HLD - Followed by her PCP as an outpatient. LDL 100, Lp (a) 184.1. Remains on Atorvastatin '80mg'$  daily and Zetia '10mg'$  daily.    6. Tobacco Use - She continues to smoke 0.5 ppd. Cessation advised.      For questions or updates, please contact [place] Please consult [email or web address] for contact info under        Signed, Angelique Blonder, DO  05/22/2022, 8:02 AM     ATTENDING ATTESTATION:  After conducting a review of all available clinical information with the care team, interviewing the patient, and performing a physical exam, I agree with the findings and plan described in this note.   GEN: No acute distress.   HEENT:  MMM, no JVD, no scleral icterus Cardiac: RRR, no murmurs, rubs, or gallops.  Respiratory: Clear to auscultation bilaterally. GI: Soft, nontender, non-distended  MS: No edema; No deformity. Neuro:  Nonfocal  Vasc:  +2 radial pulses; R radial dressing in place  Patient remains stable overnight awaiting planned PCI with atherectomy later today.  Her dyspnea (thought 2/2 to ticagrelor) is somewhat improved.  If she cannot tolerate ticagrelor, will have to consider conversion to plavix given her history of CVA.  I did speak with her regarding the risks and benefits of the planned procedure today and she agrees with the plan.  I have reviewed the risks, indications, and alternatives to cardiac catheterization, possible angioplasty, and  stenting with the patient. Risks include but are not limited to bleeding, infection, vascular injury, stroke, myocardial infection, arrhythmia, kidney injury, radiation-related injury in the case of prolonged fluoroscopy use, emergency cardiac surgery, and death. The patient understands the risks of serious complication is 1-2 in 9532 with diagnostic cardiac cath and 1-2% or less with angioplasty/stenting.    Lenna Sciara, MD Pager [phone]

## 2022-05-23 ENCOUNTER — Encounter (HOSPITAL_COMMUNITY): Payer: Self-pay | Admitting: Cardiovascular Disease

## 2022-05-23 ENCOUNTER — Telehealth: Payer: Self-pay | Admitting: Physician Assistant

## 2022-05-23 LAB — BASIC METABOLIC PANEL
Anion gap: 9 (ref 5–15)
BUN: 9 mg/dL (ref 8–23)
CO2: 21 mmol/L — ABNORMAL LOW (ref 22–32)
Calcium: 9.4 mg/dL (ref 8.9–10.3)
Chloride: 110 mmol/L (ref 98–111)
Creatinine, Ser: 0.91 mg/dL (ref 0.44–1.00)
GFR, Estimated: >60 mL/min (ref >60)
Glucose, Bld: 73 mg/dL (ref 70–99)
Potassium: 3.7 mmol/L (ref 3.5–5.1)
Sodium: 140 mmol/L (ref 135–145)

## 2022-05-23 LAB — CBC
HCT: 32.7 % — ABNORMAL LOW (ref 36.0–46.0)
Hemoglobin: 11.3 g/dL — ABNORMAL LOW (ref 12.0–15.0)
MCH: 30.6 pg (ref 26.0–34.0)
MCHC: 34.6 g/dL (ref 30.0–36.0)
MCV: 88.6 fL (ref 80.0–100.0)
Platelets: 127 10*3/uL — ABNORMAL LOW (ref 150–400)
RBC: 3.69 MIL/uL — ABNORMAL LOW (ref 3.87–5.11)
RDW: 15.1 % (ref 11.5–15.5)
WBC: 9.3 10*3/uL (ref 4.0–10.5)
nRBC: 0 % (ref 0.0–0.2)

## 2022-05-23 MED ORDER — CLOPIDOGREL BISULFATE 75 MG PO TABS: 600.0000 mg | ORAL_TABLET | Freq: Once | ORAL | Status: DC

## 2022-05-23 MED ORDER — CLOPIDOGREL BISULFATE 75 MG PO TABS: 75.0000 mg | ORAL_TABLET | Freq: Every day | ORAL | 3 refills | Status: DC

## 2022-05-23 MED ORDER — ISOSORBIDE MONONITRATE ER 30 MG PO TB24: 30.0000 mg | ORAL_TABLET | Freq: Every day | ORAL | 3 refills | Status: DC

## 2022-05-23 MED ORDER — ASPIRIN 81 MG PO TBEC: 81.0000 mg | DELAYED_RELEASE_TABLET | Freq: Every day | ORAL | 12 refills | Status: AC

## 2022-05-23 MED ORDER — VIPERSLIDE LUBRICANT OPTIME: TOPICAL | Status: DC | PRN

## 2022-05-23 NOTE — Discharge Instructions (Addendum)
You were hospitalized for worsening chest pain. You had narrowing of your coronary arteries which required stenting of your right coronary artery. Cardiac catheterization was successful and you were feeling well on morning of discharge. At discharge, please start taking Plavix 75 mg daily, Aspirin 81 mg daily and Imdur 30 mg daily at bedtime. You will stop your Amlodipine. We have scheduled you a follow-up with cardiology on 06/03/22 (see below). We have sent referrals for the lipid clinic and outpatient cardiac rehabilitation. Thank you for allowing [place] to be part of your care.   We arranged for you to follow up at:  06/03/2022 2:20 PM (Arrive by 2:05 PM) Lenna Sciara, NP [place]    Please note these changes made to your medications:   Please START taking:  - Plavix 75 mg daily - Imdur 30 mg daily at bedtime  Please STOP taking: - Amlodipine  Please make sure to follow up with your cardiology appointment.

## 2022-05-23 NOTE — Discharge Summary (Addendum)
Discharge Summary    Patient ID: Elizabeth [name] MRN: [id number]; DOB: 11-Jul-1951  Admit date: 05/19/2022 Discharge date: 05/23/2022  PCP:  Biagio Borg, MD   [place] [facility] Cardiologist:  Phineas Inches, MD   Discharge Diagnoses    Principal Problem:   Chest pain Active Problems:   Unstable angina [facility])   Acute coronary syndrome [facility])    Diagnostic Studies/Procedures    05/22/22 Coronary Catheterization   Prox RCA to Mid RCA lesion is 60% stenosed.   Ost RCA to Prox RCA lesion is 20% stenosed.   Mid RCA to Dist RCA lesion is 50% stenosed.   Dist RCA lesion is 99% stenosed.   RPDA lesion is 99% stenosed.   RPAV lesion is 99% stenosed.   A drug-eluting stent was successfully placed using a STENT ONYX FRONTIER 2.5X12.   Balloon angioplasty was performed using a BALLN SAPPHIRE 2.0X10.   Post intervention, there is a 0% residual stenosis.   Post intervention, there is a 50% residual stenosis.   Severe heavily calcified distal RCA stenosis. Successful PTCA/orbital atherectomy/DES placement in the distal RCA.  Balloon angioplasty of the small posterolateral artery. No stent placed in this vessel.  _____________   History of Present Illness     Elizabeth [name] is a 71 y.o. female with hx of CAD (s/p PTCA to RCA and LCx in 2001, STEMI in 2006 with DES to RCA and 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), HTN, HLD, tobacco use and prior CVA who presents with intermittent chest and shoulder pain for 1 week. She reports episodes of burning along chest with exertion. She has worsening pain with moving her right arm. Pain improves with rest. She notes this burning pain is similar to when she required intervention in the past but not as severe. Morning prior to admission, she developed worsening burning along her chest while serving communion at church which prompted her to seek evaluation. Pain did resolve. Denied dyspnea on exertion, abdominal distention or lower extremity edema.  Endorses  two-pillow orthopnea but weight has been stable. She is smoking approximately 0.5 ppd.   Initial labs shows WBC 8.2, Hgb 13.7, platelets 155, Na+ 139, K+ 4.0 and creatinine 0.88. Initial Hs Troponin 42 with repeat pending. BNP 430. CXR shows stable bilateral reticular opacities most consistent with pulmonary edema. EKG shows NSR, HR 80 with lateral infarct pattern.   Hospital Course     Consultants: none   Chest Pain concern for Accelerating Angina Acute Coronary Syndrome Repeat troponin elevated to 119. Echocardiogram showed EF 60-65% with moderate MR. Coronary angiography demonstrated highly calcified distal right coronary artery lesion, high-grade in-stent restenosis of the previously placed PDA stent, moderate in-stent restenosis of the mid right coronary artery stent, possible high-grade lesion of the RPLV branch, high-grade diagonal disease and high-grade circumflex disease as well as OM1 disease. She was started on ticagrelor and scheduled for PCI. She underwent an uncomplicated PCI atherectomy of distal right coronary artery. On day of discharge, she is feeling well with no chest pain or shortness of breath. Repeat EKG showed NSR. She will continue with DAPT but switch to Plavix from ticagrelor for financial reasons. Will need outpatient cardiology follow up and cardiac rehabilitation.   Systolic Murmur Patient has prominent systolic murmur best heard at apex on examination. Had mild MR by echo in 2017. Repeat echo showed mild-moderate MR.  HTN BP well controlled during hospital stay with Lopressor 8m BID, Amlodipine 2.580mdaily, Imdur 30 mg daily.   HLD Her  repeat LDL 100 with lipoprotein a 184.1, being on atorvastatin 80 mg daily and zetia 10 mg daily. On discharge, sent referral to lipid clinic.    Did the patient have an acute coronary syndrome (MI, NSTEMI, STEMI, etc) this admission?:  Yes                               AHA/ACC Clinical Performance & Quality Measures: Aspirin  prescribed? - Yes ADP Receptor Inhibitor (Plavix/Clopidogrel, Brilinta/Ticagrelor or Effient/Prasugrel) prescribed (includes medically managed patients)? - Yes Beta Blocker prescribed? - Yes High Intensity Statin (Lipitor 40-45m or Crestor 20-421m prescribed? - Yes EF assessed during THIS hospitalization? - Yes For EF <40%, was ACEI/ARB prescribed? - Not Applicable (EF >/= 4050%For EF <40%, Aldosterone Antagonist (Spironolactone or Eplerenone) prescribed? - Not Applicable (EF >/= 4056%Cardiac Rehab Phase II ordered (including medically managed patients)? - Yes   _____________  Discharge Vitals Blood pressure 138/80, pulse 80, temperature 98.1 F (36.7 C), temperature source Oral, resp. rate 16, height '5\' 3"'  (1.6 m), weight 56.1 kg, SpO2 98 %.  Filed Weights   05/19/22 2025  Weight: 56.1 kg    Labs & Radiologic Studies    CBC Recent Labs    05/22/22 0548 05/23/22 0300  WBC 7.2 9.3  HGB 12.6 11.3*  HCT 35.3* 32.7*  MCV 86.3 88.6  PLT 138* 12979  Basic Metabolic Panel Recent Labs    05/22/22 0548 05/23/22 0300  NA 137 140  K 3.9 3.7  CL 110 110  CO2 22 21*  GLUCOSE 91 73  BUN 13 9  CREATININE 0.94 0.91  CALCIUM 9.5 9.4   Liver Function Tests No results for input(s): "AST", "ALT", "ALKPHOS", "BILITOT", "PROT", "ALBUMIN" in the last 72 hours. No results for input(s): "LIPASE", "AMYLASE" in the last 72 hours. High Sensitivity Troponin:   Recent Labs  Lab 05/19/22 1005 05/19/22 1225 05/20/22 0521  TROPONINIHS 42* 75* 119*    BNP Invalid input(s): "POCBNP" D-Dimer No results for input(s): "DDIMER" in the last 72 hours. Hemoglobin A1C No results for input(s): "HGBA1C" in the last 72 hours. Fasting Lipid Panel No results for input(s): "CHOL", "HDL", "LDLCALC", "TRIG", "CHOLHDL", "LDLDIRECT" in the last 72 hours. Thyroid Function Tests No results for input(s): "TSH", "T4TOTAL", "T3FREE", "THYROIDAB" in the last 72 hours.  Invalid input(s):  "FREET3" _____________  CARDIAC CATHETERIZATION  Result Date: 05/22/2022   Prox RCA to Mid RCA lesion is 60% stenosed.   Ost RCA to Prox RCA lesion is 20% stenosed.   Mid RCA to Dist RCA lesion is 50% stenosed.   Dist RCA lesion is 99% stenosed.   RPDA lesion is 99% stenosed.   RPAV lesion is 99% stenosed.   A drug-eluting stent was successfully placed using a STENT ONYX FRONTIER 2.5X12.   Balloon angioplasty was performed using a BALLN SAPPHIRE 2.0X10.   Post intervention, there is a 0% residual stenosis.   Post intervention, there is a 50% residual stenosis. Severe heavily calcified distal RCA stenosis. Successful PTCA/orbital atherectomy/DES placement in the distal RCA. Balloon angioplasty of the small posterolateral artery. No stent placed in this vessel. Recommendations: Continue DAPT with ASA and Brilinta for one year. Probable discharge home tomorrow.   CARDIAC CATHETERIZATION  Result Date: 05/20/2022   Prox RCA to Mid RCA lesion is 60% stenosed.   Ost RCA to Prox RCA lesion is 20% stenosed.   Mid RCA to Dist RCA lesion is 50% stenosed.  Dist RCA lesion is 99% stenosed.   RPDA lesion is 99% stenosed.   RPAV lesion is 99% stenosed.   Mid Cx lesion is 80% stenosed.   1st Mrg lesion is 80% stenosed.   Ost LAD to Prox LAD lesion is 50% stenosed.   Mid LAD lesion is 20% stenosed.   2nd Diag lesion is 99% stenosed. Severe triple vessel CAD 2. Patent stent mid LAD.  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 Severe stenosis moderate caliber diagonal branch in the mid segment. 3. Severe mid Circumflex stenosis. Severe ostial stenosis small caliber obtuse marginal branch 4. The RCA is a large dominant vessel. The entire vessel is heavily calcified. The mid stented segment is patent with moderate restenosis. The distal vessel has a heavily calcified severe stenosis. The PDA stent has diffuse severe restenosis. The posterolateral branch has severe, heavily calcified stenosis. 5. Normal LVEDP Recommendations: She  has complex multivessel CAD. Will review options with IC team. I am not sure her target vessels are suitable for bypass. If we approached revascularization with PCI, we could consider treatment of the RCA which would be complex requiring orbital atherectomy and likely some difficulty delivering balloons/stents through the prior mid stented segment and heavily calcified mid vessel.   ECHOCARDIOGRAM COMPLETE  Result Date: 05/19/2022    ECHOCARDIOGRAM REPORT   Patient Name:   Elizabeth [name] Date of Exam: 05/19/2022 Medical Rec #:  [id number]      Height:       63.0 in Accession #:    [id number]     Weight:       126.0 lb Date of Birth:  [birth year]      BSA:          1.589 m Patient Age:    35 years       BP:           147/91 mmHg Patient Gender: F              HR:           72 bpm. Exam Location:  Inpatient Procedure: 2D Echo Indications:    murmur  History:        Patient has prior history of Echocardiogram examinations, most                 recent 05/21/2016. CAD, Signs/Symptoms:Chest Pain; Risk                 Factors:Hypertension, Dyslipidemia and Current Smoker.  Sonographer:    Johny Chess RDCS Referring Phys: [id number] [place]  1. Left ventricular ejection fraction, by estimation, is 60 to 65%. The left ventricle has normal function. The left ventricle has no regional wall motion abnormalities. There is mild asymmetric left ventricular hypertrophy of the basal-septal segment. Left ventricular diastolic parameters are indeterminate.  2. Right ventricular systolic function is normal. The right ventricular size is normal. There is normal pulmonary artery systolic pressure.  3. Anterior leaflet with leaflet tip calcification and subvalvular apparatus calcification. The mitral valve is abnormal. Mild to moderate mitral valve regurgitation. Moderate mitral annular calcification.  4. The aortic valve is tricuspid. Aortic valve regurgitation is not visualized. No aortic stenosis is present.   5. The inferior vena cava is normal in size with greater than 50% respiratory variability, suggesting right atrial pressure of 3 mmHg. Comparison(s): Changes from prior study are noted. Mitral valve with leaflet and subvalvular calcification as noted, mild-moderate MR. Conclusion(s)/Recommendation(s): Otherwise normal echocardiogram, with minor abnormalities  described in the report. FINDINGS  Left Ventricle: Left ventricular ejection fraction, by estimation, is 60 to 65%. The left ventricle has normal function. The left ventricle has no regional wall motion abnormalities. The left ventricular internal cavity size was normal in size. There is  mild asymmetric left ventricular hypertrophy of the basal-septal segment. Left ventricular diastolic parameters are indeterminate. Right Ventricle: The right ventricular size is normal. Right vetricular wall thickness was not well visualized. Right ventricular systolic function is normal. There is normal pulmonary artery systolic pressure. The tricuspid regurgitant velocity is 1.94 m/s, and with an assumed right atrial pressure of 3 mmHg, the estimated right ventricular systolic pressure is 83.3 mmHg. Left Atrium: Left atrial size was normal in size. Right Atrium: Right atrial size was normal in size. Pericardium: There is no evidence of pericardial effusion. Presence of epicardial fat layer. Mitral Valve: Anterior leaflet with leaflet tip calcification and subvalvular apparatus calcification. The mitral valve is abnormal. There is mild thickening of the mitral valve leaflet(s). There is moderate calcification of the mitral valve leaflet(s). Moderate mitral annular calcification. Mild to moderate mitral valve regurgitation. MV peak gradient, 14.3 mmHg. The mean mitral valve gradient is 5.0 mmHg. Tricuspid Valve: The tricuspid valve is normal in structure. Tricuspid valve regurgitation is trivial. No evidence of tricuspid stenosis. Aortic Valve: The aortic valve is tricuspid.  Aortic valve regurgitation is not visualized. No aortic stenosis is present. Pulmonic Valve: The pulmonic valve was grossly normal. Pulmonic valve regurgitation is trivial. No evidence of pulmonic stenosis. Aorta: The aortic root, ascending aorta, aortic arch and descending aorta are all structurally normal, with no evidence of dilitation or obstruction. Venous: The inferior vena cava is normal in size with greater than 50% respiratory variability, suggesting right atrial pressure of 3 mmHg. IAS/Shunts: The atrial septum is grossly normal.  LEFT VENTRICLE PLAX 2D LVOT diam:     1.70 cm   Diastology LV SV:         57        LV e' medial:    5.44 cm/s LV SV Index:   36        LV E/e' medial:  19.9 LVOT Area:     2.27 cm  LV e' lateral:   6.42 cm/s                          LV E/e' lateral: 16.8  RIGHT VENTRICLE             IVC RV S prime:     12.70 cm/s  IVC diam: 1.00 cm TAPSE (M-mode): 1.8 cm LEFT ATRIUM             Index        RIGHT ATRIUM          Index LA Vol (A2C):   46.7 ml 29.39 ml/m  RA Area:     9.26 cm LA Vol (A4C):   43.9 ml 27.63 ml/m  RA Volume:   17.70 ml 11.14 ml/m LA Biplane Vol: 45.7 ml 28.76 ml/m  AORTIC VALVE LVOT Vmax:   131.00 cm/s LVOT Vmean:  81.900 cm/s LVOT VTI:    0.250 m  AORTA Ao Root diam: 3.20 cm Ao Asc diam:  3.20 cm MITRAL VALVE                  TRICUSPID VALVE MV Area (PHT): 2.01 cm       TR Peak grad:   15.1  mmHg MV Peak grad:  14.3 mmHg      TR Vmax:        194.00 cm/s MV Mean grad:  5.0 mmHg MV Vmax:       1.89 m/s       SHUNTS MV Vmean:      99.4 cm/s      Systemic VTI:  0.25 m MV Decel Time: 377 msec       Systemic Diam: 1.70 cm MR Peak grad:    132.2 mmHg MR Mean grad:    85.0 mmHg MR Vmax:         575.00 cm/s MR Vmean:        438.0 cm/s MR PISA:         1.57 cm MR PISA Eff ROA: 11 mm MR PISA Radius:  0.50 cm MV E velocity: 108.00 cm/s MV A velocity: 165.00 cm/s MV E/A ratio:  0.65 Buford Dresser MD Electronically signed by Buford Dresser MD Signature  Date/Time: 05/19/2022/3:58:33 PM    Final    DG Chest 2 View  Result Date: 05/19/2022 CLINICAL DATA:  Chest pain, cough. EXAM: CHEST - 2 VIEW COMPARISON:  May 15, 2022. FINDINGS: Stable cardiomediastinal silhouette. Stable reticular opacities are noted throughout both lungs concerning for pulmonary edema. No pneumothorax or pleural effusion is noted. Bony thorax is unremarkable. IMPRESSION: Stable bilateral reticular opacities are noted most consistent with pulmonary edema. Electronically Signed   By: Marijo Conception M.D.   On: 05/19/2022 10:30   DG Chest 2 View  Result Date: 05/16/2022 CLINICAL DATA:  Chest pain. EXAM: CHEST - 2 VIEW COMPARISON:  Chest radiograph May 24, 2005 FINDINGS: Cardiac contours upper limits of normal. Coarse bilateral interstitial pulmonary opacities. Trace pleural effusions. No pneumothorax. Thoracic spine degenerative changes. IMPRESSION: Findings suggestive of cardiomegaly and interstitial pulmonary edema. Atypical infection not excluded. Electronically Signed   By: Lovey Newcomer M.D.   On: 05/16/2022 19:39   DG Shoulder Right  Result Date: 05/15/2022 CLINICAL DATA:  Pain. EXAM: RIGHT SHOULDER - 2+ VIEW COMPARISON:  None Available. FINDINGS: Mild glenohumeral and AC joint degenerative changes. No fractures or dislocations. No bony lesions. No other abnormalities. IMPRESSION: Mild glenohumeral and AC joint degenerative changes. No other abnormalities. Electronically Signed   By: Dorise Bullion III M.D.   On: 05/15/2022 15:20    Disposition   Pt is being discharged home today in good condition.  Follow-up Plans & Appointments   Cardiology: 06/03/2022 2:20 PM (Arrive by 2:05 PM) Lenna Sciara, NP [place]     Discharge Instructions     AMB Referral to [facility]   Complete by: As directed    [place] for Marcelle Overlie also   Reason For Referral: Lipids   Amb Referral to Cardiac Rehabilitation   Complete by: As directed     Diagnosis: Coronary Stents   After initial evaluation and assessments completed: Virtual Based Care may be provided alone or in conjunction with Phase 2 Cardiac Rehab based on patient barriers.: Yes   Intensive Cardiac Rehabilitation (ICR) [place] location only OR Traditional Cardiac Rehabilitation (TCR) *If criteria for ICR are not met will enroll in TCR [facility] only): Yes   Call MD for:  difficulty breathing, headache or visual disturbances   Complete by: As directed    Call MD for:  extreme fatigue   Complete by: As directed    Call MD for:  hives   Complete by: As directed    Call  MD for:  persistant dizziness or light-headedness   Complete by: As directed    Call MD for:  persistant nausea and vomiting   Complete by: As directed    Call MD for:  redness, tenderness, or signs of infection (pain, swelling, redness, odor or green/yellow discharge around incision site)   Complete by: As directed    Call MD for:  severe uncontrolled pain   Complete by: As directed    Call MD for:  temperature >100.4   Complete by: As directed    Diet - low sodium heart healthy   Complete by: As directed    Increase activity slowly   Complete by: As directed        Discharge Medications   Allergies as of 05/23/2022       Reactions   Shrimp [shellfish Allergy] Nausea And Vomiting   Adhesive [tape] Other (See Comments)   Unknown   Soy Allergy Nausea And Vomiting        Medication List     STOP taking these medications    amLODipine 2.5 MG tablet Commonly known as: NORVASC       TAKE these medications    albuterol 108 (90 Base) MCG/ACT inhaler Commonly known as: VENTOLIN HFA Inhale 2 puffs into the lungs every 6 (six) hours as needed for wheezing or shortness of breath.   alendronate 70 MG tablet Commonly known as: FOSAMAX Take 1 tablet (70 mg total) by mouth every 7 (seven) days. Take with a full glass of water on an empty stomach.   aspirin EC 81 MG tablet Take 1 tablet (81 mg  total) by mouth daily. Swallow whole. Start taking on: May 24, 2022 What changed:  medication strength how much to take additional instructions   atorvastatin 80 MG tablet Commonly known as: LIPITOR Take 1 tablet (80 mg total) by mouth daily at 6 PM.   clopidogrel 75 MG tablet Commonly known as: PLAVIX Take 1 tablet (75 mg total) by mouth daily.   cyanocobalamin 1000 MCG tablet Commonly known as: VITAMIN B12 Take 1 tablet (1,000 mcg total) by mouth daily.   escitalopram 10 MG tablet Commonly known as: LEXAPRO Take 1 tablet (10 mg total) by mouth daily.   ezetimibe 10 MG tablet Commonly known as: ZETIA Take 1 tablet (10 mg total) by mouth daily.   isosorbide mononitrate 30 MG 24 hr tablet Commonly known as: IMDUR Take 1 tablet (30 mg total) by mouth at bedtime.   metoprolol tartrate 50 MG tablet Commonly known as: LOPRESSOR Take 1 tablet (50 mg total) by mouth 2 (two) times daily.   nitroGLYCERIN 0.4 MG/SPRAY spray Commonly known as: NITROLINGUAL PLACE 1 SPRAY UNDER THE TONGUE EVERY 5 (FIVE) MINUTES AS NEEDED. MAY REPEAT UP TO 3 DOSES.   telmisartan 80 MG tablet Commonly known as: MICARDIS 1 tab by mouth once daily           Duration of Discharge Encounter   Greater than 30 minutes including physician time.  Shary Decamp, DO 05/23/2022, 1:47 PM  ATTENDING ATTESTATION:  After conducting a review of all available clinical information with the care team, interviewing the patient, and performing a physical exam, I agree with the findings and plan described in this note.   GEN: No acute distress.   HEENT:  MMM, no JVD, no scleral icterus Cardiac: RRR, no murmurs, rubs, or gallops.  Respiratory: Clear to auscultation bilaterally. GI: Soft, nontender, non-distended  MS: No edema; No deformity. Neuro:  Nonfocal  Vasc:  +2 radial pulses  Patient doing well after atherectomy and PCI of distal RCA.  Cont DAPT x 1 year, restart telmisartan, stop  amlodipine, cont Imdur (for residual disease not treated with PCI), BB, high dose atorvastatin, zetia, and PRN NTG.  Will refer to lipid clinic given LDL > 100 on high dose statin and zetia.  Cardiac rehab and cardiology follow up is arranged.  Lenna Sciara, MD Pager [phone]

## 2022-05-23 NOTE — Progress Notes (Signed)
CARDIAC REHAB PHASE I   PRE:  Rate/Rhythm: 89 SR  BP:  Supine: 121/79     SaO2: 100  MODE:  Ambulation: 470 ft   POST:  Rate/Rhythm: 83 SR  BP:  Sitting: 146/84     SaO2: 100  Pt received in bed, agreeable to ambulation. Pt to standing from EOB independently. Pt ambulated in hallway independently with standby assist, tolerated well with no s/s. Pt and husband educated on risk factors, exercise guidelines, angina/NTG, stent, restrictions and site care, nutrition, smoking cessation, and orientation to CRP2 referral to [facility]. Pt and husband receptive to education, all questions from pt and husband answered. Pt left in bed with call bell in reach.  [id number]   Colbert Ewing, MS 05/23/2022 9:48 AM

## 2022-05-23 NOTE — Care Management Important Message (Signed)
Important Message  Patient Details  Name: Elizabeth [name] MRN: [id number] Date of Birth: 11-04-1950   Medicare Important Message Given:  Yes     Shelda Altes 05/23/2022, 10:22 AM

## 2022-05-23 NOTE — Progress Notes (Signed)
Patient DID NOT receive 600 mg dose of Plavix prior to discharge... Spoke to [employer], PA she stated she would call patient with instructions to take this dose tonight.

## 2022-05-23 NOTE — Telephone Encounter (Signed)
RN contacted me regarding Plavix loading.  She was supposed to get 600 mg which is 8 tablets before she left.  However, this did not happen.  I was able to get her on the phone and advised her that she needs to take a total of 8 Plavix tablets tonight, for now and then for in a couple of hours with a snack.  Starting tomorrow, she takes 1 tablet daily.  I reviewed these instructions with her, she repeated them back correctly.  She will call us if there are any issues.  Rosaria Ferries, PA-C 05/23/2022 7:06 PM

## 2022-05-23 NOTE — Progress Notes (Addendum)
Rounding Note    Patient Name: Elizabeth [name] Date of Encounter: 05/23/2022  [place] Cardiologist: Phineas Inches, MD  Subjective   She reports feeling well this morning. Some soreness and bruising on right forearm but improved. No shortness of breath.   Inpatient Medications    Scheduled Meds:  amLODipine  2.5 mg Oral Daily   aspirin EC  81 mg Oral Daily   atorvastatin  80 mg Oral q1800   cyanocobalamin  1,000 mcg Oral Daily   enoxaparin (LOVENOX) injection  40 mg Subcutaneous Q24H   escitalopram  10 mg Oral Daily   ezetimibe  10 mg Oral Daily   isosorbide mononitrate  30 mg Oral Daily   metoprolol tartrate  50 mg Oral BID   nitroGLYCERIN  0.4 mg Sublingual Once   sodium chloride flush  3 mL Intravenous Q12H   sodium chloride flush  3 mL Intravenous Q12H   sodium chloride flush  3 mL Intravenous Q12H   ticagrelor  90 mg Oral BID   Continuous Infusions:  sodium chloride     sodium chloride     PRN Meds: sodium chloride, sodium chloride, acetaminophen, albuterol, nitroGLYCERIN, ondansetron (ZOFRAN) IV, mouth rinse, sodium chloride flush, sodium chloride flush   Vital Signs    Vitals:   05/22/22 1823 05/22/22 2021 05/23/22 0025 05/23/22 0611  BP: (!) 93/58 (!) 92/58 95/73 (!) 129/101  Pulse: 64 73 71 71  Resp:  '16 16 16  '$ Temp:  97.6 F (36.4 C) 97.8 F (36.6 C) 98.1 F (36.7 C)  TempSrc:  Oral Oral Oral  SpO2: 96% 100% 98% 98%  Weight:      Height:        Intake/Output Summary (Last 24 hours) at 05/23/2022 0901 Last data filed at 05/22/2022 2000 Gross per 24 hour  Intake 1711.61 ml  Output --  Net 1711.61 ml       05/19/2022    8:25 PM 05/15/2022   11:30 AM 07/06/2021    9:56 AM  Last 3 Weights  Weight (lbs) 123 lb 10.9 oz 126 lb 123 lb 12.8 oz  Weight (kg) 56.1 kg 57.153 kg 56.155 kg      Telemetry    NSR - Personally Reviewed.   ECG    NA - Personally Reviewed  Physical Exam  GEN: Alert, in no acute distress Neck:  supple Cardiac: RRR, systolic murmur at apex  Respiratory: Normal work of breathing. Clear to auscultation bilaterally. MS: No edema, no deformity Neuro:  alert and oriented x 3  Skin: ecchymosis on right distal forearm Psych: Normal affect   Labs    High Sensitivity Troponin:   Recent Labs  Lab 05/19/22 1005 05/19/22 1225 05/20/22 0521  TROPONINIHS 42* 75* 119*      Chemistry Recent Labs  Lab 05/20/22 0521 05/22/22 0548 05/23/22 0300  NA 135 137 140  K 3.9 3.9 3.7  CL 106 110 110  CO2 24 22 21*  GLUCOSE 83 91 73  BUN '9 13 9  '$ CREATININE 0.96 0.94 0.91  CALCIUM 9.5 9.5 9.4  GFRNONAA >60 >60 >60  ANIONGAP '5 5 9     '$ Lipids  Recent Labs  Lab 05/20/22 0521  CHOL 161  TRIG 53  HDL 50  LDLCALC 100*  CHOLHDL 3.2     Hematology Recent Labs  Lab 05/19/22 1005 05/22/22 0548 05/23/22 0300  WBC 8.2 7.2 9.3  RBC 4.48 4.09 3.69*  HGB 13.7 12.6 11.3*  HCT 40.6 35.3* 32.7*  MCV 90.6 86.3 88.6  MCH 30.6 30.8 30.6  MCHC 33.7 35.7 34.6  RDW 15.8* 15.0 15.1  PLT 155 138* 127*    Thyroid  No results for input(s): "TSH", "FREET4" in the last 168 hours.   BNP Recent Labs  Lab 05/19/22 1226  BNP 430.5*     DDimer No results for input(s): "DDIMER" in the last 168 hours.   Radiology    CARDIAC CATHETERIZATION  Result Date: 05/22/2022   Prox RCA to Mid RCA lesion is 60% stenosed.   Ost RCA to Prox RCA lesion is 20% stenosed.   Mid RCA to Dist RCA lesion is 50% stenosed.   Dist RCA lesion is 99% stenosed.   RPDA lesion is 99% stenosed.   RPAV lesion is 99% stenosed.   A drug-eluting stent was successfully placed using a STENT ONYX FRONTIER 2.5X12.   Balloon angioplasty was performed using a BALLN SAPPHIRE 2.0X10.   Post intervention, there is a 0% residual stenosis.   Post intervention, there is a 50% residual stenosis. Severe heavily calcified distal RCA stenosis. Successful PTCA/orbital atherectomy/DES placement in the distal RCA. Balloon angioplasty of the small  posterolateral artery. No stent placed in this vessel. Recommendations: Continue DAPT with ASA and Brilinta for one year. Probable discharge home tomorrow.    Cardiac Studies   Left Heart Cath and Coronary Angiography Severe triple vessel CAD 2. Patent stent mid LAD.  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 Severe stenosis moderate caliber diagonal branch in the mid segment.  3. Severe mid Circumflex stenosis. Severe ostial stenosis small caliber obtuse marginal branch 4. The RCA is a large dominant vessel. The entire vessel is heavily calcified. The mid stented segment is patent with moderate restenosis. The distal vessel has a heavily calcified severe stenosis. The PDA stent has diffuse severe restenosis. The posterolateral branch has severe, heavily calcified stenosis.  5. Normal LVEDP  Patient Profile     71 y.o. female with a hx of CAD (s/p PTCA to RCA and LCx in 2001, STEMI in 2006 with DES to RCA and 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), HTN, HLD, tobacco use and prior CVA who is being seen 05/20/2022 for the evaluation of chest pain at the request of Dr. Ronnald Nian.  She is s/p PTCA/orbital atherectomy/DES to RCA and balloon angioplasty to small posterolateral artery.  Assessment & Plan    1. Chest Pain concerning for Accelerating Angina S/p PTCA/orbital atherectomy and DES in distal RCA and balloon angioplasty of small posterolateral artery.  - continue ticagrelor 90 mg BID and ASA 81 mg daily   2. CAD She is s/p PTCA/orbital atherectomy/DES to RCA and balloon angioplasty to small posterolateral artery. Hx of PTCA to RCA and LCx in 2001 with STEMI in 2006 with DES to RCA and 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 at that time as well. - Remains on ASA 81 mg daily, Atorvastatin '80mg'$  daily, Zetia '10mg'$  daily and Lopressor '50mg'$  BID.   3. Systolic Murmur She does have a prominent murmur on examination. Had mild MR by echo in 2017. Repeat echo showed mild-moderate MR.   4. HTN BP continues to be well-controlled.  -  Lopressor '50mg'$  BID, Amlodipine 2.'5mg'$  daily, Imdur 30 mg daily.    5. HLD - Followed by her PCP as an outpatient. LDL 100, Lp (a) 184.1.  - on Atorvastatin '80mg'$  daily and Zetia '10mg'$  daily.    6. Tobacco Use - She continues to smoke 0.5 ppd. Cessation advised.      For questions  or updates, please contact [facility] Please consult [email or web address] for contact info under        Signed, Angelique Blonder, DO  05/23/2022, 9:01 AM      ATTENDING ATTESTATION:  After conducting a review of all available clinical information with the care team, interviewing the patient, and performing a physical exam, I agree with the findings and plan described in this note.   GEN: No acute distress.   HEENT:  MMM, no JVD, no scleral icterus Cardiac: RRR, no murmurs, rubs, or gallops.  Respiratory: Clear to auscultation bilaterally. GI: Soft, nontender, non-distended  MS: No edema; No deformity. Neuro:  Nonfocal  Vasc:  +2 radial pulses; moderate right upper extremity bruising  Patient is doing well after uncomplicated PCI with atherectomy of distal right coronary artery.  The patient remains chest pain-free.  She has some mild to moderate bruising of the right radial site.  Given her lack of insurance I think it is best if we changed her over to Plavix now.  We will Plavix load with 600 mg and discharged on dual antiplatelet therapy, Lopressor, high-dose atorvastatin, and Zetia.  Of note her LDL on admission here was 100 which is above goal and she would been on atorvastatin 80 and Zetia previously.  Her LP(a) is also quite elevated.  We will refer to lipid clinic.  Discharge today with cardiology follow-up and cardiac rehabilitation referral.  Lenna Sciara, MD Pager [phone]

## 2022-05-24 ENCOUNTER — Telehealth: Payer: Self-pay | Admitting: *Deleted

## 2022-05-24 ENCOUNTER — Other Ambulatory Visit: Payer: Self-pay | Admitting: *Deleted

## 2022-05-24 NOTE — Telephone Encounter (Signed)
Transition Care Management Unsuccessful Follow-up Telephone Call  Date of discharge and from where:  05/23/2022  Attempts:  1st Attempt  Reason for unsuccessful TCM follow-up call:  Unable to leave message Patient voicemail not set up.

## 2022-05-29 NOTE — Telephone Encounter (Signed)
Transition Care Management Follow-up Telephone Call Date of discharge and from where: 05/23/2022 Elizabeth [name] How have you been since you were released from the hospital? Patient has been fine, just a little sore  Any questions or concerns? No  Items Reviewed: Did the pt receive and understand the discharge instructions provided? Yes  Medications obtained and verified? Yes  Other? No  Any new allergies since your discharge? No  Dietary orders reviewed? No Do you have support at home? Yes   Home Care and Equipment/Supplies: Were home health services ordered? no If so, what is the name of the agency? N/a  Has the agency set up a time to come to the patient's home? not applicable Were any new equipment or medical supplies ordered?  No What is the name of the medical supply agency? N/a Were you able to get the supplies/equipment? not applicable Do you have any questions related to the use of the equipment or supplies? No  Functional Questionnaire: (I = Independent and D = Dependent) ADLs: I  Bathing/Dressing- I  Meal Prep- I  Eating- I  Maintaining continence- I  Transferring/Ambulation- I  Managing Meds- I  Follow up appointments reviewed:  PCP Hospital f/u appt confirmed? Yes  Scheduled to see Elizabeth [name] on 05/30/2022 @ [facility] f/u appt confirmed? Yes  Scheduled to see Elizabeth [name] on 06/03/2022 @ 2:20pm. Are transportation arrangements needed? No  If their condition worsens, is the pt aware to call PCP or go to the Emergency Dept.? Yes Was the patient provided with contact information for the PCP's office or ED? Yes Was to pt encouraged to call back with questions or concerns? Yes

## 2022-05-30 ENCOUNTER — Ambulatory Visit (INDEPENDENT_AMBULATORY_CARE_PROVIDER_SITE_OTHER): Payer: Medicare Other | Admitting: Internal Medicine

## 2022-05-30 VITALS — BP 124/68 | HR 58 | Temp 98.2°F | Ht 63.0 in | Wt 126.0 lb

## 2022-05-30 DIAGNOSIS — E559 Vitamin D deficiency, unspecified: Secondary | ICD-10-CM

## 2022-05-30 DIAGNOSIS — R739 Hyperglycemia, unspecified: Secondary | ICD-10-CM | POA: Diagnosis not present

## 2022-05-30 DIAGNOSIS — E782 Mixed hyperlipidemia: Secondary | ICD-10-CM

## 2022-05-30 DIAGNOSIS — I10 Essential (primary) hypertension: Secondary | ICD-10-CM

## 2022-05-30 NOTE — Patient Instructions (Signed)
Please continue all other medications as before, and refills have been done if requested.  Please have the pharmacy call with any other refills you may need.  Please continue your efforts at being more active, low cholesterol diet, and weight control.  Please keep your appointments with your specialists as you may have planned - the [facility], and Cardiology  Please make an Appointment to return in 6 months, or sooner if needed

## 2022-05-30 NOTE — Progress Notes (Unsigned)
Patient ID: Elizabeth [name], female   DOB: 1950/10/20, 71 y.o.   MRN: [id number]        Chief Complaint: follow up HTN, HLD and hyperglycemia , low vit d       HPI:  Elizabeth [name] is a 71 y.o. female here overall doing ok; Pt denies chest pain, increased sob or doe, wheezing, orthopnea, PND, increased LE swelling, palpitations, dizziness or syncope.   Pt denies polydipsia, polyuria, or new focal neuro s/s.    Pt denies fever, wt loss, night sweats, loss of appetite, or other constitutional symptoms  Now with large bruising to RUE but stable to improved in last 2-3 days.  Referred to lipid clinic due to high LDL on lipitor and zetia. 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; groin hematoma and lower abdominal hematoma then   Degenerative joint disease    Generalized   Hyperlipidemia    Hypertension    Myocardial infarction ([place])    x3, last MI was 6 years ago per pt/Stent placed   Osteoporosis 06/05/2017   Sickle cell trait ([place])    Stroke ([place]) 2017   Tobacco abuse April, 2011   Continued   Past Surgical History:  Procedure Laterality Date   ANGIOPLASTY  2006   x3 stents   COLONOSCOPY  05/20/2011   CORONARY ATHERECTOMY N/A 05/22/2022   Procedure: CORONARY ATHERECTOMY;  Surgeon: Burnell Blanks, MD;  Location: [facility];  Service: Cardiovascular;  Laterality: N/A;   CORONARY STENT INTERVENTION N/A 05/22/2022   Procedure: CORONARY STENT INTERVENTION;  Surgeon: Burnell Blanks, MD;  Location: [facility];  Service: Cardiovascular;  Laterality:  N/A;   DILATION AND CURETTAGE OF UTERUS     EP IMPLANTABLE DEVICE N/A 05/21/2016   Procedure: Loop Recorder Insertion;  Surgeon: Thompson Grayer, MD;  Location: [facility];  Service: Cardiovascular;  Laterality: N/A;   FOOT SURGERY  2000   LEFT HEART CATH AND CORONARY ANGIOGRAPHY N/A 05/20/2022   Procedure: LEFT HEART CATH AND CORONARY ANGIOGRAPHY;  Surgeon: Burnell Blanks, MD;  Location: [facility];  Service: Cardiovascular;  Laterality: N/A;   LITHOTRIPSY  2008   left kidney stone and also ureteral stent placed   POLYPECTOMY     TEE WITHOUT CARDIOVERSION N/A 05/21/2016   Procedure: TRANSESOPHAGEAL ECHOCARDIOGRAM (TEE);  Surgeon: Lelon Perla, MD;  Location: [facility];  Service: Cardiovascular;  Laterality: N/A;    reports that she has been smoking cigarettes. She has been smoking an average of .5 packs per day. She has never used smokeless tobacco. She reports that she does not drink alcohol and does not use drugs. family history includes Coronary artery disease in her father and mother; Heart disease in her brother; Stroke in her mother. Allergies  Allergen Reactions   Shrimp [Shellfish Allergy] Nausea And Vomiting   Adhesive [Tape] Other (See Comments)    Unknown   Soy Allergy  Nausea And Vomiting   Current Outpatient Medications on File Prior to Visit  Medication Sig Dispense Refill   albuterol (VENTOLIN HFA) 108 (90 Base) MCG/ACT inhaler Inhale 2 puffs into the lungs every 6 (six) hours as needed for wheezing or shortness of breath. 8 g 11   alendronate (FOSAMAX) 70 MG tablet Take 1 tablet (70 mg total) by mouth every 7 (seven) days. Take with a full glass of water on an empty stomach. 12 tablet 3   aspirin EC 81 MG tablet Take 1 tablet (81 mg total) by mouth daily. Swallow whole. 30 tablet 12   clopidogrel (PLAVIX) 75 MG tablet Take 1 tablet (75 mg total) by mouth daily. 90 tablet 3   escitalopram (LEXAPRO) 10 MG tablet Take 1 tablet (10 mg total) by mouth  daily. 90 tablet 3   ezetimibe (ZETIA) 10 MG tablet Take 1 tablet (10 mg total) by mouth daily. 90 tablet 3   isosorbide mononitrate (IMDUR) 30 MG 24 hr tablet Take 1 tablet (30 mg total) by mouth at bedtime. 90 tablet 3   metoprolol tartrate (LOPRESSOR) 50 MG tablet Take 1 tablet (50 mg total) by mouth 2 (two) times daily. 180 tablet 3   nitroGLYCERIN (NITROLINGUAL) 0.4 MG/SPRAY spray PLACE 1 SPRAY UNDER THE TONGUE EVERY 5 (FIVE) MINUTES AS NEEDED. MAY REPEAT UP TO 3 DOSES. 12 g 1   telmisartan (MICARDIS) 80 MG tablet 1 tab by mouth once daily 90 tablet 3   vitamin B-12 (CYANOCOBALAMIN) 1000 MCG tablet Take 1 tablet (1,000 mcg total) by mouth daily. 90 tablet 3   atorvastatin (LIPITOR) 80 MG tablet Take 1 tablet (80 mg total) by mouth daily at 6 PM. (Patient not taking: Reported on 05/30/2022) 90 tablet 3   No current facility-administered medications on file prior to visit.        ROS:  All others reviewed and negative.  Objective        PE:  BP 124/68 (BP Location: Left Arm, Patient Position: Sitting, Cuff Size: Large)   Pulse (!) 58   Temp 98.2 F (36.8 C) (Oral)   Ht '5\' 3"'$  (1.6 m)   Wt 126 lb (57.2 kg)   SpO2 94%   BMI 22.32 kg/m                 Constitutional: Pt appears in NAD               HENT: Head: NCAT.                Right Ear: External ear normal.                 Left Ear: External ear normal.                Eyes: . Pupils are equal, round, and reactive to light. Conjunctivae and EOM are normal               Nose: without d/c or deformity               Neck: Neck supple. Gross normal ROM               Cardiovascular: Normal rate and regular rhythm.                 Pulmonary/Chest: Effort normal and breath sounds without rales or wheezing.                Abd:  Soft, NT, ND, + BS, no  organomegaly               Neurological: Pt is alert. At baseline orientation, motor grossly intact               Skin: Skin is warm. No rashes, no other new lesions, LE edema - none                Psychiatric: Pt behavior is normal without agitation   Micro: none  Cardiac tracings I have personally interpreted today:  none  Pertinent Radiological findings (summarize): none   Lab Results  Component Value Date   WBC 9.3 05/23/2022   HGB 11.3 (L) 05/23/2022   HCT 32.7 (L) 05/23/2022   PLT 127 (L) 05/23/2022   GLUCOSE 73 05/23/2022   CHOL 161 05/20/2022   TRIG 53 05/20/2022   HDL 50 05/20/2022   LDLDIRECT 164.8 01/17/2010   LDLCALC 100 (H) 05/20/2022   ALT 12 05/15/2022   AST 14 05/15/2022   NA 140 05/23/2022   K 3.7 05/23/2022   CL 110 05/23/2022   CREATININE 0.91 05/23/2022   BUN 9 05/23/2022   CO2 21 (L) 05/23/2022   TSH 2.07 05/15/2022   INR 0.95 05/19/2016   HGBA1C 5.7 07/06/2021   Assessment/Plan:  Elizabeth [name] is a 71 y.o. Black or African American [2] female with  has a past medical history of Abrasion or friction burn of foot and toe(s), with infection, Allergy, Anxiety, CAD (coronary artery disease), Degenerative joint disease, Hyperlipidemia, Hypertension, Myocardial infarction [facility]), Osteoporosis (06/05/2017), Sickle cell trait ([place]), Stroke [facility]) (2017), and Tobacco abuse (April, 2011).  Vitamin D deficiency Last vitamin D Lab Results  Component Value Date   VD25OH 17.88 (L) 05/15/2022   Low, reminded to start oral replacement   Benign essential HTN BP Readings from Last 3 Encounters:  05/30/22 124/68  05/23/22 138/80  05/15/22 120/72   Stable, pt to continue medical treatment  Lopressor 50 bid   Hyperglycemia Lab Results  Component Value Date   HGBA1C 5.7 07/06/2021   Stable, pt to continue current medical treatment  - diet, wt control excercise   Mixed hyperlipidemia Lab Results  Component Value Date   LDLCALC 100 (H) 05/20/2022   Uncontrolled, goal LDL < 70,, pt to continue zetia 10 qd, and lipitor 80 qd and f/u. Lipid clinic as planned  Followup: Return in about 6 months (around 11/28/2022).  Cathlean Cower, MD 06/02/2022 6:45  [facility]

## 2022-06-02 ENCOUNTER — Encounter: Payer: Self-pay | Admitting: Internal Medicine

## 2022-06-02 NOTE — Assessment & Plan Note (Signed)
Lab Results  Component Value Date   LDLCALC 100 (H) 05/20/2022   Uncontrolled, goal LDL < 70,, pt to continue zetia 10 qd, and lipitor 80 qd and f/u. Lipid clinic as planned

## 2022-06-02 NOTE — Assessment & Plan Note (Signed)
BP Readings from Last 3 Encounters:  05/30/22 124/68  05/23/22 138/80  05/15/22 120/72   Stable, pt to continue medical treatment  Lopressor 50 bid

## 2022-06-02 NOTE — Assessment & Plan Note (Signed)
Lab Results  Component Value Date   HGBA1C 5.7 07/06/2021   Stable, pt to continue current medical treatment  - diet, wt control excercise

## 2022-06-02 NOTE — Assessment & Plan Note (Signed)
Last vitamin D Lab Results  Component Value Date   VD25OH 17.88 (L) 05/15/2022   Low, reminded to start oral replacement

## 2022-06-03 ENCOUNTER — Ambulatory Visit: Payer: Medicare Other | Attending: Nurse Practitioner | Admitting: Nurse Practitioner

## 2022-06-03 ENCOUNTER — Encounter: Payer: Self-pay | Admitting: Nurse Practitioner

## 2022-06-03 VITALS — BP 120/70 | HR 59 | Ht 63.0 in | Wt 125.4 lb

## 2022-06-03 DIAGNOSIS — I10 Essential (primary) hypertension: Secondary | ICD-10-CM | POA: Insufficient documentation

## 2022-06-03 DIAGNOSIS — Z720 Tobacco use: Secondary | ICD-10-CM | POA: Diagnosis not present

## 2022-06-03 DIAGNOSIS — Z789 Other specified health status: Secondary | ICD-10-CM | POA: Diagnosis not present

## 2022-06-03 DIAGNOSIS — E785 Hyperlipidemia, unspecified: Secondary | ICD-10-CM | POA: Diagnosis not present

## 2022-06-03 DIAGNOSIS — I2510 Atherosclerotic heart disease of native coronary artery without angina pectoris: Secondary | ICD-10-CM | POA: Insufficient documentation

## 2022-06-03 DIAGNOSIS — Z8673 Personal history of transient ischemic attack (TIA), and cerebral infarction without residual deficits: Secondary | ICD-10-CM | POA: Diagnosis not present

## 2022-06-03 DIAGNOSIS — I340 Nonrheumatic mitral (valve) insufficiency: Secondary | ICD-10-CM | POA: Diagnosis not present

## 2022-06-03 NOTE — Patient Instructions (Signed)
Medication Instructions:  Your physician recommends that you continue on your current medications as directed. Please refer to the Current Medication list given to you today.   *If you need a refill on your cardiac medications before your next appointment, please call your pharmacy*   Lab Work: NONE ordered at this time of appointment   If you have labs (blood work) drawn today and your tests are completely normal, you will receive your results only by: [place] (if you have MyChart) OR A paper copy in the mail If you have any lab test that is abnormal or we need to change your treatment, we will call you to review the results.   Testing/Procedures: NONE ordered at this time of appointment     Follow-Up: At [facility] you and your health needs are our priority.  As part of our continuing mission to provide you with exceptional heart care, we have created designated Provider Care Teams.  These Care Teams include your primary Cardiologist (physician) and Advanced Practice Providers (APPs -  Physician Assistants and Nurse Practitioners) who all work together to provide you with the care you need, when you need it.  We recommend signing up for the patient portal called "MyChart".  Sign up information is provided on this After Visit Summary.  MyChart is used to connect with patients for Virtual Visits (Telemedicine).  Patients are able to view lab/test results, encounter notes, upcoming appointments, etc.  Non-urgent messages can be sent to your provider as well.   To learn more about what you can do with MyChart, go to [email or web address].    Your next appointment:   3 month(s)  The format for your next appointment:   In Person  Provider:   Janina Mayo, MD  or [name], NP        Other Instructions Referral to Lipid Clinic(PharmD)  Important Information About Sugar

## 2022-06-03 NOTE — Addendum Note (Signed)
Addended by: Derrick Ravel on: 9/47/0761 51:83 PM   Modules accepted: Orders

## 2022-06-03 NOTE — Progress Notes (Signed)
Office Visit    Patient Name: Elizabeth [name] Date of Encounter: 06/03/2022  Primary Care Provider:  Biagio Borg, MD Primary Cardiologist:  Janina Mayo, MD  Chief Complaint    71 year old female with a history of CAD s/p PTCA-RCA and LCx in 2001, STEMI c/ DES-RCA, staged BMS-LAD in 2006, PTCA/DES-dRCA in 04/2022, mitral valve regurgitation, hypertension, hyperlipidemia, CVA, and prior tobacco use who presents for posthospital follow-up related to CAD s/p DES-dRCA.   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, 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; groin hematoma and lower abdominal hematoma then   Degenerative joint disease    Generalized   Hyperlipidemia    Hypertension    Myocardial infarction ([place])    x3, last MI was 6 years ago per pt/Stent placed   Osteoporosis 06/05/2017   Sickle cell trait ([place])    Stroke ([place]) 2017   Tobacco abuse April, 2011   Continued   Past Surgical History:  Procedure Laterality Date   ANGIOPLASTY  2006   x3 stents   COLONOSCOPY  05/20/2011   CORONARY ATHERECTOMY N/A 05/22/2022   Procedure: CORONARY ATHERECTOMY;  Surgeon: Burnell Blanks, MD;  Location: [facility];  Service: Cardiovascular;  Laterality: N/A;   CORONARY STENT INTERVENTION N/A 05/22/2022   Procedure: CORONARY STENT INTERVENTION;  Surgeon: Burnell Blanks, MD;  Location: [facility];  Service: Cardiovascular;  Laterality: N/A;   DILATION AND CURETTAGE OF UTERUS     EP IMPLANTABLE DEVICE N/A 05/21/2016   Procedure: Loop Recorder Insertion;  Surgeon: Thompson Grayer, MD;  Location: [facility];  Service: Cardiovascular;  Laterality: N/A;   FOOT SURGERY  2000   LEFT HEART CATH AND CORONARY ANGIOGRAPHY N/A 05/20/2022   Procedure: LEFT HEART CATH AND CORONARY ANGIOGRAPHY;  Surgeon: Burnell Blanks, MD;  Location: [facility];  Service: Cardiovascular;  Laterality: N/A;   LITHOTRIPSY  2008   left kidney stone and also ureteral stent placed   POLYPECTOMY     TEE WITHOUT CARDIOVERSION N/A 05/21/2016   Procedure: TRANSESOPHAGEAL ECHOCARDIOGRAM (TEE);  Surgeon: Lelon Perla, MD;  Location: [facility];  Service: Cardiovascular;  Laterality: N/A;    Allergies  Allergies  Allergen Reactions   Shrimp [Shellfish Allergy] Nausea And Vomiting   Adhesive [Tape] Other (See Comments)    Unknown   Soy Allergy Nausea And Vomiting    History of Present Illness    71 year old female with the above past medical history including CAD s/p PTCA-RCA and LCx in 2001, STEMI c/ DES-RCA, staged BMS-LAD in 2006, PTCA/DES-dRCA in 04/2022, mitral valve regurgitation, hypertension, hyperlipidemia, CVA, and prior tobacco use.  She has a longstanding history of CAD with prior stenting to RCA and LAD.  She has not been evaluated by cardiology since 2020.  She presented to the ED on 05/19/2022 with a 1 week history of intermittent exertional chest and shoulder pain.  Troponin was mildly elevated on admission and then rose to 119, EKG was stable overall.  Echocardiogram showed EF 60 to 65%, moderate mitral valve regurgitation.  Repeat cardiac catheterization revealed highly calcified dRCA lesion, grade in-stent restenosis of the previously placed PDA stent, moderate in-stent restenosis of the mid RCA stent, possible high-grade lesion  of the R PLV branch, high-grade diagonal disease, and high-grade circumflex disease as well as OM1 disease.  She underwent PCI atherectomy/DES-dRCA.  She was started on aspirin and Brilinta, Brilinta was switched to Plavix for financial reasons. LDL was elevated spite being on high intensity statin and Zetia.  She was referred to the lipid clinic for follow-up as an outpatient.  She was discharged home in stable condition on 05/23/2022.  She presents today for follow-up  accompanied by her husband. Since her hospitalization has done well from a cardiac standpoint. She denies any symptoms concerning for angina.  She does have a moderate healing right forearm hematoma.  The site is soft, tender to touch, significant bruising and healing stages, no bleeding or bruit, significantly improved.  Overall, she reports feeling well and denies any additional concerns today.   Home Medications    Current Outpatient Medications  Medication Sig Dispense Refill   albuterol (VENTOLIN HFA) 108 (90 Base) MCG/ACT inhaler Inhale 2 puffs into the lungs every 6 (six) hours as needed for wheezing or shortness of breath. 8 g 11   alendronate (FOSAMAX) 70 MG tablet Take 1 tablet (70 mg total) by mouth every 7 (seven) days. Take with a full glass of water on an empty stomach. 12 tablet 3   aspirin EC 81 MG tablet Take 1 tablet (81 mg total) by mouth daily. Swallow whole. 30 tablet 12   atorvastatin (LIPITOR) 80 MG tablet Take 1 tablet (80 mg total) by mouth daily at 6 PM. 90 tablet 3   clopidogrel (PLAVIX) 75 MG tablet Take 1 tablet (75 mg total) by mouth daily. 90 tablet 3   escitalopram (LEXAPRO) 10 MG tablet Take 1 tablet (10 mg total) by mouth daily. 90 tablet 3   ezetimibe (ZETIA) 10 MG tablet Take 1 tablet (10 mg total) by mouth daily. 90 tablet 3   isosorbide mononitrate (IMDUR) 30 MG 24 hr tablet Take 1 tablet (30 mg total) by mouth at bedtime. 90 tablet 3   metoprolol tartrate (LOPRESSOR) 50 MG tablet Take 1 tablet (50 mg total) by mouth 2 (two) times daily. 180 tablet 3   nitroGLYCERIN (NITROLINGUAL) 0.4 MG/SPRAY spray PLACE 1 SPRAY UNDER THE TONGUE EVERY 5 (FIVE) MINUTES AS NEEDED. MAY REPEAT UP TO 3 DOSES. 12 g 1   telmisartan (MICARDIS) 80 MG tablet 1 tab by mouth once daily 90 tablet 3   vitamin B-12 (CYANOCOBALAMIN) 1000 MCG tablet Take 1 tablet (1,000 mcg total) by mouth daily. 90 tablet 3   No current facility-administered medications for this visit.     Review of  Systems    She denies chest pain, palpitations, dyspnea, pnd, orthopnea, n, v, dizziness, syncope, edema, weight gain, or early satiety. All other systems reviewed and are otherwise negative except as noted above.     Cardiac Rehabilitation Eligibility Assessment  The patient is ready to start cardiac rehabilitation from a cardiac standpoint.    Physical Exam    VS:  BP 120/70   Pulse (!) 59   Ht '5\' 3"'$  (1.6 m)   Wt 125 lb 6.4 oz (56.9 kg)   SpO2 98%   BMI 22.21 kg/m   GEN: Well nourished, well developed, in no acute distress. HEENT: normal. Neck: Supple, no JVD, carotid bruits, or masses. Cardiac: RRR, no murmurs, rubs, or gallops. No clubbing, cyanosis, edema.  Radials/DP/PT 2+ and equal bilaterally.  Right radial cath site with significant bruising, soft, mildly tender to touch, no bruit. Respiratory:  Respirations regular  and unlabored, clear to auscultation bilaterally. GI: Soft, nontender, nondistended, BS + x 4. MS: no deformity or atrophy. Skin: warm and dry, no rash. Neuro:  Strength and sensation are intact. Psych: Normal affect.  Accessory Clinical Findings    ECG personally reviewed by me today -sinus bradycardia, 59 bpm- no acute changes.   Lab Results  Component Value Date   WBC 9.3 05/23/2022   HGB 11.3 (L) 05/23/2022   HCT 32.7 (L) 05/23/2022   MCV 88.6 05/23/2022   PLT 127 (L) 05/23/2022   Lab Results  Component Value Date   CREATININE 0.91 05/23/2022   BUN 9 05/23/2022   NA 140 05/23/2022   K 3.7 05/23/2022   CL 110 05/23/2022   CO2 21 (L) 05/23/2022   Lab Results  Component Value Date   ALT 12 05/15/2022   AST 14 05/15/2022   ALKPHOS 87 05/15/2022   BILITOT 0.2 05/15/2022   Lab Results  Component Value Date   CHOL 161 05/20/2022   HDL 50 05/20/2022   LDLCALC 100 (H) 05/20/2022   LDLDIRECT 164.8 01/17/2010   TRIG 53 05/20/2022   CHOLHDL 3.2 05/20/2022    Lab Results  Component Value Date   HGBA1C 5.7 07/06/2021    Assessment &  Plan    1. CAD: H/o prior stenting to RCA and LAD. S/p DES-dRCA in 04/2022.  Residual disease managed medically.  Echo showed EF 60 to 65%, moderate mitral valve regurgitation. Stable with no anginal symptoms. No indication for ischemic evaluation.  Continue aspirin, Plavix, metoprolol, telmisartan, Imdur, Lipitor, and Zetia.  2. Mitral valve regurgitation: Moderate on most recent echo.  Asymptomatic.  Consider repeat echo when clinically indicated.  3. Hypertension: BP well controlled. Continue current antihypertensive regimen.   4. Hyperlipidemia: LDL was 100 on 04/2022, above goal bite high intensity statin therapy, Zetia. She was referred to lipid clinic for consideration of PCSK9 inhibitor.  It appears they attempted to contact her, however, patient is unaware of any attempted communication.  Will re-send referral.  Continue aspirin, Plavix, Lipitor, Zetia.  5. H/o CVA: Continue aspirin, Plavix, Lipitor, Zetia.   6. Tobacco use: She states she has quit smoking since her hospitalization.  I congratulated her on this.   7. Disposition: Follow-up in 3 months.      Lenna Sciara, NP 06/03/2022, 2:58 PM

## 2022-06-10 ENCOUNTER — Other Ambulatory Visit: Payer: Self-pay

## 2022-06-10 DIAGNOSIS — Z1231 Encounter for screening mammogram for malignant neoplasm of breast: Secondary | ICD-10-CM

## 2022-06-21 ENCOUNTER — Telehealth (HOSPITAL_COMMUNITY): Payer: Self-pay

## 2022-06-21 NOTE — Telephone Encounter (Signed)
Called and left message with pt spouse Denyse Amass to have pt return CR phone call for scheduling.

## 2022-06-27 ENCOUNTER — Encounter: Payer: Self-pay | Admitting: Pharmacist

## 2022-06-27 ENCOUNTER — Ambulatory Visit: Payer: Medicare Other | Attending: Cardiology | Admitting: Pharmacist

## 2022-06-27 DIAGNOSIS — I6340 Cerebral infarction due to embolism of unspecified cerebral artery: Secondary | ICD-10-CM | POA: Diagnosis not present

## 2022-06-27 DIAGNOSIS — E782 Mixed hyperlipidemia: Secondary | ICD-10-CM | POA: Insufficient documentation

## 2022-06-27 DIAGNOSIS — I200 Unstable angina: Secondary | ICD-10-CM | POA: Diagnosis not present

## 2022-06-27 DIAGNOSIS — I2510 Atherosclerotic heart disease of native coronary artery without angina pectoris: Secondary | ICD-10-CM | POA: Insufficient documentation

## 2022-06-27 DIAGNOSIS — F339 Major depressive disorder, recurrent, unspecified: Secondary | ICD-10-CM | POA: Diagnosis not present

## 2022-06-27 DIAGNOSIS — I10 Essential (primary) hypertension: Secondary | ICD-10-CM | POA: Diagnosis not present

## 2022-06-27 MED ORDER — ROSUVASTATIN CALCIUM 40 MG PO TABS: 40.0000 mg | ORAL_TABLET | Freq: Every day | ORAL | 3 refills | Status: DC

## 2022-06-27 MED ORDER — TELMISARTAN 80 MG PO TABS: ORAL_TABLET | ORAL | 3 refills | Status: DC

## 2022-06-27 MED ORDER — ESCITALOPRAM OXALATE 10 MG PO TABS: 10.0000 mg | ORAL_TABLET | Freq: Every day | ORAL | 3 refills | Status: DC

## 2022-06-27 MED ORDER — REPATHA SURECLICK 140 MG/ML ~~LOC~~ SOAJ: 1.0000 mL | SUBCUTANEOUS | 3 refills | Status: DC

## 2022-06-27 MED ORDER — NITROGLYCERIN 0.4 MG/SPRAY TL SOLN: 1.0000 | 1 refills | Status: DC | PRN

## 2022-06-27 MED ORDER — METOPROLOL TARTRATE 50 MG PO TABS: 50.0000 mg | ORAL_TABLET | Freq: Two times a day (BID) | ORAL | 3 refills | Status: DC

## 2022-06-27 NOTE — Progress Notes (Signed)
Patient ID: Elizabeth [name]                 DOB: 11-Mar-1951                    MRN: [id number]      HPI: Elizabeth [name] is a 71 y.o. female patient referred to lipid clinic by [name]. PMH is significant for CAD, CVA, HTN, angina, HLD and smoking. Had left heart cath and coronary atherectomy from 8/27-8/31.  Patient presents today with husband. Reports compliance with medications but is confused about what she is taking. Dispensing history shows she had not been picking up medications frequently within past year. She does not believe she has refills on many maintenance medications. Is struggling swallowing atorvastatin '80mg'$ .  Upon questioning, patient and husband do not have a Medicare part D plan. Husband picks up medications but he does not know what his costs have been.   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, 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; groin hematoma and lower abdominal hematoma then   Degenerative joint disease    Generalized   Hyperlipidemia    Hypertension    Myocardial infarction ([place])    x3, last MI was 6 years ago per pt/Stent placed   Osteoporosis 06/05/2017   Sickle cell trait ([place])    Stroke ([place]) 2017   Tobacco abuse April, 2011   Continued    Current Outpatient Medications on File Prior to Visit  Medication Sig Dispense Refill   albuterol (VENTOLIN HFA) 108 (90 Base) MCG/ACT inhaler Inhale 2 puffs into the lungs every 6 (six) hours as needed for wheezing or shortness of breath. 8 g 11   alendronate (FOSAMAX) 70 MG tablet Take 1 tablet (70 mg total) by mouth every 7  (seven) days. Take with a full glass of water on an empty stomach. 12 tablet 3   aspirin EC 81 MG tablet Take 1 tablet (81 mg total) by mouth daily. Swallow whole. 30 tablet 12   atorvastatin (LIPITOR) 80 MG tablet Take 1 tablet (80 mg total) by mouth daily at 6 PM. 90 tablet 3   clopidogrel (PLAVIX) 75 MG tablet Take 1 tablet (75 mg total) by mouth daily. 90 tablet 3   escitalopram (LEXAPRO) 10 MG tablet Take 1 tablet (10 mg total) by mouth daily. 90 tablet 3   ezetimibe (ZETIA) 10 MG tablet Take 1 tablet (10 mg total) by mouth daily. 90 tablet 3   isosorbide mononitrate (IMDUR) 30 MG 24 hr tablet Take 1 tablet (30 mg total) by mouth at bedtime. 90 tablet 3   metoprolol tartrate (LOPRESSOR) 50 MG tablet Take 1 tablet (50 mg total) by mouth 2 (two) times daily. 180 tablet 3   nitroGLYCERIN (NITROLINGUAL) 0.4 MG/SPRAY spray PLACE 1 SPRAY UNDER THE TONGUE EVERY 5 (FIVE) MINUTES AS NEEDED. MAY REPEAT UP TO 3 DOSES. 12 g 1   telmisartan (MICARDIS) 80 MG tablet 1 tab by mouth once daily 90 tablet 3   vitamin B-12 (CYANOCOBALAMIN)  1000 MCG tablet Take 1 tablet (1,000 mcg total) by mouth daily. 90 tablet 3   No current facility-administered medications on file prior to visit.    Allergies  Allergen Reactions   Shrimp [Shellfish Allergy] Nausea And Vomiting   Adhesive [Tape] Other (See Comments)    Unknown   Soy Allergy Nausea And Vomiting    Assessment/Plan:  1. Hyperlipidemia - Patient LDL 100 which is above goal. Due to history of stroke and MI, will aim for goal of <55. Patient and husband confused regarding medication regimen. Due to lack of Medicare Part D plan, unlikely to be able to afford further therapies. Explained that Medicare open enrollment starts on 10/15 and gave information for patient to call to set up an appointment for them to sign up.  Recommended patient start PCSK9i. Using demo pen, educated patient on mechanism of action, storage, site selection, administration, and  possible adverse effects. Not able to submit prior authorization since patient has no medication plan. Had patient fill out patient assistance paperwork and faxed to manufacturer. Gave Repatha sample.   Since patient having trouble swallowing atorvastatin '80mg'$ , will switch to rosuvastatin '40mg'$ . Patient appreciative. Will also refill maintenance medications at this time and stressed importance of compliance.  Recheck lipid panel in 2-3 months  Karren Cobble, PharmD, [place], [place], [address], [place], [place], [address] Phone: [phone], Fax: [phone]    [phone]

## 2022-06-27 NOTE — Patient Instructions (Addendum)
It was nice meeting you two today  Please continue your ezetimibe '10mg'$  once a day  I will change your atorvastatin '80mg'$  to rosuvastatin '40mg'$  to make it easier for you to take the pill  We would like to start a new medication called Repatha, which you will inject once every 2 weeks  I will send in the patient assistance application for you  Once you receive the medication, we will recheck your cholesterol in about 3 months  Please call the [place] for help with open enrollment for next year  Please call with any questions  Karren Cobble, PharmD, [place], [place], [address] [place], [place], [place], [address] Phone: [phone], Fax: [phone]

## 2022-07-03 ENCOUNTER — Encounter (HOSPITAL_COMMUNITY): Payer: Self-pay

## 2022-07-03 ENCOUNTER — Telehealth (HOSPITAL_COMMUNITY): Payer: Self-pay

## 2022-07-03 NOTE — Telephone Encounter (Signed)
Attempted to call patient in regards to Cardiac Rehab - LM on VM Mailed letter 

## 2022-07-10 ENCOUNTER — Other Ambulatory Visit: Payer: Self-pay | Admitting: Internal Medicine

## 2022-07-10 NOTE — Telephone Encounter (Signed)
Sorry no refill needed 

## 2022-07-10 NOTE — Telephone Encounter (Signed)
Please refill as per office routine med refill policy (all routine meds to be refilled for 3 mo or monthly (per pt preference) up to one year from last visit, then month to month grace period for 3 mo, then further med refills will have to be denied) ? ?

## 2022-08-09 ENCOUNTER — Other Ambulatory Visit: Payer: Self-pay | Admitting: Internal Medicine

## 2022-09-10 ENCOUNTER — Telehealth: Payer: Self-pay | Admitting: Pharmacist

## 2022-09-10 ENCOUNTER — Ambulatory Visit: Payer: Medicare Other | Attending: Internal Medicine | Admitting: Internal Medicine

## 2022-09-10 ENCOUNTER — Encounter: Payer: Self-pay | Admitting: Internal Medicine

## 2022-09-10 DIAGNOSIS — I6340 Cerebral infarction due to embolism of unspecified cerebral artery: Secondary | ICD-10-CM | POA: Insufficient documentation

## 2022-09-10 DIAGNOSIS — I2510 Atherosclerotic heart disease of native coronary artery without angina pectoris: Secondary | ICD-10-CM | POA: Insufficient documentation

## 2022-09-10 MED ORDER — REPATHA SURECLICK 140 MG/ML ~~LOC~~ SOAJ: 1.0000 mL | SUBCUTANEOUS | 3 refills | Status: AC

## 2022-09-10 NOTE — Telephone Encounter (Signed)
Repatha patient assistance application faxed to [employer]

## 2022-09-10 NOTE — Patient Instructions (Signed)
Medication Instructions:  No Changes In Medications at this time.  *If you need a refill on your cardiac medications before your next appointment, please call your pharmacy*  Lab Work: None Ordered At This Time.  If you have labs (blood work) drawn today and your tests are completely normal, you will receive your results only by: [place] (if you have MyChart) OR A paper copy in the mail If you have any lab test that is abnormal or we need to change your treatment, we will call you to review the results.  Testing/Procedures: None Ordered At This Time.   Follow-Up: At [facility], you and your health needs are our priority.  As part of our continuing mission to provide you with exceptional heart care, we have created designated Provider Care Teams.  These Care Teams include your primary Cardiologist (physician) and Advanced Practice Providers (APPs -  Physician Assistants and Nurse Practitioners) who all work together to provide you with the care you need, when you need it.  Your next appointment:   6 month(s)  The format for your next appointment:   In Person  Provider:   Janina Mayo, MD

## 2022-09-10 NOTE — Progress Notes (Signed)
Cardiology Office Note:    Date:  09/10/2022   ID:  Elizabeth [name], DOB April 04, 1951, MRN [id number]  PCP:  Elizabeth Borg, MD   [facility] Cardiologist:  Elizabeth Mayo, MD     Referring MD: Elizabeth Borg, MD   No chief complaint on file. CAD  History of Present Illness:    Elizabeth [name] is a 71 y.o. female with a hx  CAD (s/p PTCA to RCA and LCx in 2001, STEMI in 2006 with DES to RCA and 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), HTN, HLD, tobacco use and prior CVA (still has ILR in place, no afib) who is being seen 05/19/2022. She was having intermittent CP and shoulder pain for 1 week. Echo showed normal LV function. MR more moderate. Had Elizabeth [name] that showed  highly calcified distal right coronary artery lesion, high-grade in-stent restenosis of the previously placed PDA stent, moderate in-stent restenosis of the mid right coronary artery stent, possible high-grade lesion of the RPLV Elizabeth [name], high-grade diagonal disease and high-grade circumflex disease as well as OM1 disease. She was started on ticagrelor and scheduled for PCI. She underwent an uncomplicated PCI atherectomy of distal right coronary artery.  Circ dx was not amenable to PCI, she had no good surgical targets.  Was planned to continue ticag however for cost reasons switched to plavix.   Interim Hx 09/10/2022 No issues with her medications. BP is well controlled. Pending patient assistance for her repatha.   Otherwise, she walks for exercise. She denies chest pain. She can get tired.  No SOB. No orthopnea or PND. No LE edema.  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, 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; groin hematoma and lower abdominal hematoma then   Degenerative joint disease    Generalized   Hyperlipidemia    Hypertension    Myocardial infarction ([place])    x3, last MI was 6 years ago  per pt/Stent placed   Osteoporosis 06/05/2017   Sickle cell trait ([place])    Stroke ([place]) 2017   Tobacco abuse April, 2011   Continued    Past Surgical History:  Procedure Laterality Date   ANGIOPLASTY  2006   x3 stents   COLONOSCOPY  05/20/2011   CORONARY ATHERECTOMY N/A 05/22/2022   Procedure: CORONARY ATHERECTOMY;  Surgeon: Elizabeth Blanks, MD;  Location: [facility];  Service: Cardiovascular;  Laterality: N/A;   CORONARY STENT INTERVENTION N/A 05/22/2022   Procedure: CORONARY STENT INTERVENTION;  Surgeon: Elizabeth Blanks, MD;  Location: [facility];  Service: Cardiovascular;  Laterality: N/A;   DILATION AND CURETTAGE OF UTERUS     EP IMPLANTABLE DEVICE N/A 05/21/2016   Procedure: Loop Recorder Insertion;  Surgeon: Elizabeth Grayer, MD;  Location: [facility];  Service: Cardiovascular;  Laterality: N/A;   FOOT SURGERY  2000   LEFT HEART CATH AND CORONARY ANGIOGRAPHY N/A 05/20/2022   Procedure: LEFT HEART CATH AND CORONARY ANGIOGRAPHY;  Surgeon: Elizabeth Blanks, MD;  Location: [facility];  Service: Cardiovascular;  Laterality: N/A;   LITHOTRIPSY  2008   left kidney stone and also ureteral stent placed   POLYPECTOMY     TEE WITHOUT CARDIOVERSION N/A 05/21/2016   Procedure: TRANSESOPHAGEAL ECHOCARDIOGRAM (TEE);  Surgeon: Elizabeth Perla, MD;  Location: [facility];  Service: Cardiovascular;  Laterality: N/A;    Current Medications: Current Meds  Medication Sig   albuterol (VENTOLIN HFA) 108 (90 Base) MCG/ACT inhaler Inhale 2 puffs into the lungs every 6 (six) hours as needed for wheezing or shortness of breath.   alendronate (FOSAMAX) 70 MG tablet Take 1 tablet (70 mg total) by mouth every 7 (seven) days. Take with a full glass of water on an empty stomach.   aspirin EC 81 MG tablet Take 1 tablet (81 mg total) by mouth daily. Swallow whole.   clopidogrel (PLAVIX) 75 MG tablet Take 1 tablet (75 mg total) by mouth daily.   escitalopram  (LEXAPRO) 10 MG tablet Take 1 tablet (10 mg total) by mouth daily.   ezetimibe (ZETIA) 10 MG tablet Take 1 tablet (10 mg total) by mouth daily.   isosorbide mononitrate (IMDUR) 30 MG 24 hr tablet Take 1 tablet (30 mg total) by mouth at bedtime.   metoprolol tartrate (LOPRESSOR) 50 MG tablet Take 1 tablet (50 mg total) by mouth 2 (two) times daily.   nitroGLYCERIN (NITROLINGUAL) 0.4 MG/SPRAY spray Place 1 spray under the tongue every 5 (five) minutes as needed. May repeat up to 3 doses.   rosuvastatin (CRESTOR) 40 MG tablet Take 1 tablet (40 mg total) by mouth daily. Please d/c atorvastatin   telmisartan (MICARDIS) 80 MG tablet 1 tab by mouth once daily   vitamin B-12 (CYANOCOBALAMIN) 1000 MCG tablet Take 1 tablet (1,000 mcg total) by mouth daily.   [DISCONTINUED] Evolocumab (REPATHA SURECLICK) 540 MG/ML SOAJ Inject 1 mL into the skin every 14 (fourteen) days.     Allergies:   Shrimp [shellfish allergy], Adhesive [tape], and Soy allergy   Social History   Socioeconomic History   Marital status: Married    Spouse name: Not on file   Number of children: Not on file   Years of education: Not on file   Highest education level: Not on file  Occupational History   Not on file  Tobacco Use   Smoking status: Every Day    Packs/day: 0.50    Types: Cigarettes   Smokeless tobacco: Never   Tobacco comments:    Ongoing since 04/2005  Vaping Use   Vaping Use: Never used  Substance and Sexual Activity   Alcohol use: No   Drug use: No   Sexual activity: Not on file  Other Topics Concern   Not on file  Social History Narrative   Not on file   Social Determinants of Health   Financial Resource Strain: Not on file  Food Insecurity: Not on file  Transportation Needs: Not on file  Physical Activity: Not on file  Stress: Not on file  Social Connections: Not on file     Family History: The patient's family history includes Coronary artery disease in her father and mother; Heart disease in  her brother; Stroke in her mother. There is no history of Colon cancer, Colon polyps, Esophageal cancer, Stomach cancer, or Rectal cancer.  ROS:   Please see the history of present illness.     All other systems reviewed and are negative.  EKGs/Labs/Other Studies Reviewed:    The following studies were reviewed today:   Recent Labs: 05/15/2022: ALT 12; TSH 2.07 05/19/2022: B Natriuretic Peptide 430.5 05/23/2022: BUN 9; Creatinine, Ser 0.91; Hemoglobin 11.3; Platelets 127; Potassium 3.7; Sodium 140   Recent Lipid Panel    Component Value Date/Time   CHOL 161 05/20/2022 0521   TRIG 53  05/20/2022 0521   HDL 50 05/20/2022 0521   CHOLHDL 3.2 05/20/2022 0521   VLDL 11 05/20/2022 0521   LDLCALC 100 (H) 05/20/2022 0521   LDLDIRECT 164.8 01/17/2010 0911     Risk Assessment/Calculations:         Physical Exam:    VS:   Vitals:   09/10/22 0955  BP: 113/68  Pulse: (!) 59  SpO2: 98%     Wt Readings from Last 3 Encounters:  09/10/22 130 lb 3.2 oz (59.1 kg)  06/03/22 125 lb 6.4 oz (56.9 kg)  05/30/22 126 lb (57.2 kg)     GEN:  Well nourished, well developed in no acute distress HEENT: Normal NECK: No JVD; No carotid bruits LYMPHATICS: No lymphadenopathy CARDIAC: RRR, no murmurs, rubs, gallops RESPIRATORY:  Clear to auscultation without rales, wheezing or rhonchi  ABDOMEN: Soft, non-tender, non-distended MUSCULOSKELETAL:  No edema; No deformity  SKIN: Warm and dry NEUROLOGIC:  Alert and oriented x 3 PSYCHIATRIC:  Normal affect   ASSESSMENT:    Coronary dx: normal EF. continue DAPT (ASA/plavix)  until 05/23/2023 per interventional; . Continue imdur 30 mg daily.   HLD: LDL 100, Lp(a) 184, on atorva 80 mg daily and zetia 10 mg daily. Being seen in lipid clinic with LDL goal < 55 mg dL. Planned for PCSK9i, sent in for patient assistance  Prior CVA: on dapt post PCI. Planned for repatha. No significant deficits  Mild-Moderate MR: continue surveillance.  asymptomatic  HTN: continue metop 50 mg BID, norvasc 2.5 mg daily, continue telmisartan 80 mg PLAN:    In order of problems listed above:  Follow up 6 months       Medication Adjustments/Labs and Tests Ordered: Current medicines are reviewed at length with the patient today.  Concerns regarding medicines are outlined above.  No orders of the defined types were placed in this encounter.  Meds ordered this encounter  Medications   Evolocumab (REPATHA SURECLICK) 322 MG/ML SOAJ    Sig: Inject 140 mg into the skin every 14 (fourteen) days.    Dispense:  6 mL    Refill:  3    Order Specific Question:   Lot Number?    Answer:   [id number]    Order Specific Question:   Expiration Date?    Answer:   02/20/2025    Patient Instructions  Medication Instructions:  No Changes In Medications at this time.  *If you need a refill on your cardiac medications before your next appointment, please call your pharmacy*  Lab Work: None Ordered At This Time.  If you have labs (blood work) drawn today and your tests are completely normal, you will receive your results only by: [facility] (if you have MyChart) OR A paper copy in the mail If you have any lab test that is abnormal or we need to change your treatment, we will call you to review the results.  Testing/Procedures: None Ordered At This Time.   Follow-Up: At [facility], you and your health needs are our priority.  As part of our continuing mission to provide you with exceptional heart care, we have created designated Provider Care Teams.  These Care Teams include your primary Cardiologist (physician) and Advanced Practice Providers (APPs -  Physician Assistants and Nurse Practitioners) who all work together to provide you with the care you need, when you need it.  Your next appointment:   6 month(s)  The format for your next appointment:   In Person  Provider:  Janina Mayo, MD            Signed, Elizabeth Mayo, MD  09/10/2022 10:22 AM    [place]

## 2022-11-09 ENCOUNTER — Other Ambulatory Visit: Payer: Self-pay | Admitting: Internal Medicine

## 2022-11-13 ENCOUNTER — Ambulatory Visit: Payer: Medicare Other | Admitting: Internal Medicine

## 2022-11-28 ENCOUNTER — Ambulatory Visit (INDEPENDENT_AMBULATORY_CARE_PROVIDER_SITE_OTHER): Payer: Medicare Other | Admitting: Internal Medicine

## 2022-11-28 VITALS — BP 116/62 | HR 62 | Temp 97.8°F | Ht 63.0 in | Wt 129.0 lb

## 2022-11-28 DIAGNOSIS — E559 Vitamin D deficiency, unspecified: Secondary | ICD-10-CM

## 2022-11-28 DIAGNOSIS — Z23 Encounter for immunization: Secondary | ICD-10-CM | POA: Diagnosis not present

## 2022-11-28 DIAGNOSIS — E538 Deficiency of other specified B group vitamins: Secondary | ICD-10-CM | POA: Diagnosis not present

## 2022-11-28 DIAGNOSIS — I10 Essential (primary) hypertension: Secondary | ICD-10-CM | POA: Diagnosis not present

## 2022-11-28 DIAGNOSIS — R739 Hyperglycemia, unspecified: Secondary | ICD-10-CM

## 2022-11-28 DIAGNOSIS — Z720 Tobacco use: Secondary | ICD-10-CM

## 2022-11-28 LAB — LIPID PANEL
Cholesterol: 80 mg/dL (ref 0–200)
HDL: 51.2 mg/dL (ref >39.00)
LDL Cholesterol: 18 mg/dL (ref 0–99)
NonHDL: 29.11
Total CHOL/HDL Ratio: 2
Triglycerides: 55 mg/dL (ref 0.0–149.0)
VLDL: 11 mg/dL (ref 0.0–40.0)

## 2022-11-28 LAB — HEMOGLOBIN A1C: Hgb A1c MFr Bld: 6.3 % (ref 4.6–6.5)

## 2022-11-28 LAB — URINALYSIS, ROUTINE W REFLEX MICROSCOPIC
Bilirubin Urine: NEGATIVE
Hgb urine dipstick: NEGATIVE
Ketones, ur: NEGATIVE
Leukocytes,Ua: NEGATIVE
Nitrite: NEGATIVE
RBC / HPF: NONE SEEN (ref 0–?)
Specific Gravity, Urine: <=1.005 — AB (ref 1.000–1.030)
Total Protein, Urine: NEGATIVE
Urine Glucose: NEGATIVE
Urobilinogen, UA: 1 (ref 0.0–1.0)
pH: 6.5 (ref 5.0–8.0)

## 2022-11-28 LAB — BASIC METABOLIC PANEL
BUN: 13 mg/dL (ref 6–23)
CO2: 26 mEq/L (ref 19–32)
Calcium: 10 mg/dL (ref 8.4–10.5)
Chloride: 104 mEq/L (ref 96–112)
Creatinine, Ser: 1.27 mg/dL — ABNORMAL HIGH (ref 0.40–1.20)
GFR: 42.47 mL/min — ABNORMAL LOW (ref >60.00)
Glucose, Bld: 103 mg/dL — ABNORMAL HIGH (ref 70–99)
Potassium: 4.9 mEq/L (ref 3.5–5.1)
Sodium: 135 mEq/L (ref 135–145)

## 2022-11-28 LAB — HEPATIC FUNCTION PANEL
ALT: 13 U/L (ref 0–35)
AST: 18 U/L (ref 0–37)
Albumin: 3.7 g/dL (ref 3.5–5.2)
Alkaline Phosphatase: 61 U/L (ref 39–117)
Bilirubin, Direct: 0.2 mg/dL (ref 0.0–0.3)
Total Bilirubin: 0.5 mg/dL (ref 0.2–1.2)
Total Protein: 6.3 g/dL (ref 6.0–8.3)

## 2022-11-28 LAB — CBC WITH DIFFERENTIAL/PLATELET
Basophils Absolute: 0.1 10*3/uL (ref 0.0–0.1)
Basophils Relative: 0.7 % (ref 0.0–3.0)
Eosinophils Absolute: 0.1 10*3/uL (ref 0.0–0.7)
Eosinophils Relative: 1.4 % (ref 0.0–5.0)
HCT: 36.7 % (ref 36.0–46.0)
Hemoglobin: 12.3 g/dL (ref 12.0–15.0)
Lymphocytes Relative: 42.6 % (ref 12.0–46.0)
Lymphs Abs: 3.3 10*3/uL (ref 0.7–4.0)
MCHC: 33.6 g/dL (ref 30.0–36.0)
MCV: 93.5 fl (ref 78.0–100.0)
Monocytes Absolute: 0.7 10*3/uL (ref 0.1–1.0)
Monocytes Relative: 8.9 % (ref 3.0–12.0)
Neutro Abs: 3.6 10*3/uL (ref 1.4–7.7)
Neutrophils Relative %: 46.4 % (ref 43.0–77.0)
Platelets: 117 10*3/uL — ABNORMAL LOW (ref 150.0–400.0)
RBC: 3.92 Mil/uL (ref 3.87–5.11)
RDW: 15.6 % — ABNORMAL HIGH (ref 11.5–15.5)
WBC: 7.7 10*3/uL (ref 4.0–10.5)

## 2022-11-28 LAB — VITAMIN D 25 HYDROXY (VIT D DEFICIENCY, FRACTURES): VITD: 35.04 ng/mL (ref 30.00–100.00)

## 2022-11-28 LAB — TSH: TSH: 1.41 u[IU]/mL (ref 0.35–5.50)

## 2022-11-28 LAB — VITAMIN B12: Vitamin B-12: 643 pg/mL (ref 211–911)

## 2022-11-28 NOTE — Progress Notes (Signed)
Patient ID: Elizabeth [name], female   DOB: 05-Jul-1951, 72 y.o.   MRN: [id number]         Chief Complaint:: yearly exam       HPI:  Elizabeth [name] is a 72 y.o. female overall doing ok, Pt denies chest pain, increased sob or doe, wheezing, orthopnea, PND, increased LE swelling, palpitations, dizziness or syncope.   Pt denies polydipsia, polyuria, or new focal neuro s/s.    Pt denies fever, wt loss, night sweats, loss of appetite, or other constitutional symptoms  Sister passed with dementia last wk, now grieving today.  Still smoking, pt not ready to quit.  Due for flu shot.     Wt Readings from Last 3 Encounters:  11/28/22 129 lb (58.5 kg)  09/10/22 130 lb 3.2 oz (59.1 kg)  06/03/22 125 lb 6.4 oz (56.9 kg)   BP Readings from Last 3 Encounters:  11/28/22 116/62  09/10/22 113/68  06/03/22 120/70   Immunization History  Administered Date(s) Administered   Fluad Quad(high Dose 65+) 06/02/2019, 07/05/2020, 07/06/2021, 11/28/2022   Influenza, High Dose Seasonal PF 05/22/2017, 06/24/2018   PFIZER(Purple Top)SARS-COV-2 Vaccination 10/28/2019, 11/18/2019, 07/05/2020   Pneumococcal Conjugate-13 06/05/2017   Pneumococcal Polysaccharide-23 12/19/2017   Tdap 04/02/2011   Zoster, Live 10/21/2011   Health Maintenance Due  Topic Date Due   MAMMOGRAM  06/18/2019   DTaP/Tdap/Td (2 - Td or Tdap) 04/01/2021      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, 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; groin hematoma and lower abdominal hematoma then   Degenerative joint disease    Generalized   Hyperlipidemia    Hypertension    Myocardial infarction ([place])    x3, last MI was 6 years ago per pt/Stent placed   Osteoporosis 06/05/2017   Sickle cell trait ([place])    Stroke ([place]) 2017   Tobacco abuse April, 2011   Continued   Past Surgical History:  Procedure Laterality  Date   ANGIOPLASTY  2006   x3 stents   COLONOSCOPY  05/20/2011   CORONARY ATHERECTOMY N/A 05/22/2022   Procedure: CORONARY ATHERECTOMY;  Surgeon: Burnell Blanks, MD;  Location: [facility];  Service: Cardiovascular;  Laterality: N/A;   CORONARY STENT INTERVENTION N/A 05/22/2022   Procedure: CORONARY STENT INTERVENTION;  Surgeon: Burnell Blanks, MD;  Location: [facility];  Service: Cardiovascular;  Laterality: N/A;   DILATION AND CURETTAGE OF UTERUS     EP IMPLANTABLE DEVICE N/A 05/21/2016   Procedure: Loop Recorder Insertion;  Surgeon: Thompson Grayer, MD;  Location: [facility];  Service: Cardiovascular;  Laterality: N/A;   FOOT SURGERY  2000   LEFT HEART CATH AND CORONARY ANGIOGRAPHY N/A 05/20/2022   Procedure: LEFT HEART CATH AND CORONARY ANGIOGRAPHY;  Surgeon: Burnell Blanks, MD;  Location: [facility];  Service: Cardiovascular;  Laterality: N/A;   LITHOTRIPSY  2008   left kidney stone and also ureteral stent placed   POLYPECTOMY     TEE WITHOUT CARDIOVERSION N/A 05/21/2016   Procedure: TRANSESOPHAGEAL ECHOCARDIOGRAM (TEE);  Surgeon: Lelon Perla, MD;  Location: [facility];  Service: Cardiovascular;  Laterality: N/A;    reports that she has been smoking cigarettes. She has been smoking an average of .5 packs per day. She has never  used smokeless tobacco. She reports that she does not drink alcohol and does not use drugs. family history includes Coronary artery disease in her father and mother; Heart disease in her brother; Stroke in her mother. Allergies  Allergen Reactions   Shrimp [Shellfish Allergy] Nausea And Vomiting   Adhesive [Tape] Other (See Comments)    Unknown   Soy Allergy Nausea And Vomiting   Current Outpatient Medications on File Prior to Visit  Medication Sig Dispense Refill   albuterol (VENTOLIN HFA) 108 (90 Base) MCG/ACT inhaler Inhale 2 puffs into the lungs every 6 (six) hours as needed for wheezing or  shortness of breath. 8 g 11   alendronate (FOSAMAX) 70 MG tablet Take 1 tablet (70 mg total) by mouth every 7 (seven) days. Take with a full glass of water on an empty stomach. 12 tablet 3   aspirin EC 81 MG tablet Take 1 tablet (81 mg total) by mouth daily. Swallow whole. 30 tablet 12   clopidogrel (PLAVIX) 75 MG tablet Take 1 tablet (75 mg total) by mouth daily. 90 tablet 3   escitalopram (LEXAPRO) 10 MG tablet Take 1 tablet (10 mg total) by mouth daily. 90 tablet 3   Evolocumab (REPATHA SURECLICK) [id number] MG/ML SOAJ Inject 140 mg into the skin every 14 (fourteen) days. 6 mL 3   ezetimibe (ZETIA) 10 MG tablet Take 1 tablet (10 mg total) by mouth daily. 90 tablet 3   isosorbide mononitrate (IMDUR) 30 MG 24 hr tablet Take 1 tablet (30 mg total) by mouth at bedtime. 90 tablet 3   metoprolol tartrate (LOPRESSOR) 50 MG tablet Take 1 tablet (50 mg total) by mouth 2 (two) times daily. 180 tablet 3   nitroGLYCERIN (NITROLINGUAL) 0.4 MG/SPRAY spray Place 1 spray under the tongue every 5 (five) minutes as needed. May repeat up to 3 doses. 12 g 1   rosuvastatin (CRESTOR) 40 MG tablet Take 1 tablet (40 mg total) by mouth daily. Please d/c atorvastatin 90 tablet 3   telmisartan (MICARDIS) 80 MG tablet 1 tab by mouth once daily 90 tablet 3   vitamin B-12 (CYANOCOBALAMIN) 1000 MCG tablet Take 1 tablet (1,000 mcg total) by mouth daily. 90 tablet 3   No current facility-administered medications on file prior to visit.        ROS:  All others reviewed and negative.  Objective        PE:  BP 116/62 (BP Location: Right Arm, Patient Position: Sitting, Cuff Size: Normal)   Pulse 62   Temp 97.8 F (36.6 C) (Oral)   Ht '5\' 3"'$  (1.6 m)   Wt 129 lb (58.5 kg)   SpO2 98%   BMI 22.85 kg/m                 Constitutional: Pt appears in NAD               HENT: Head: NCAT.                Right Ear: External ear normal.                 Left Ear: External ear normal.                Eyes: . Pupils are equal, round, and  reactive to light. Conjunctivae and EOM are normal               Nose: without d/c or deformity  Neck: Neck supple. Gross normal ROM               Cardiovascular: Normal rate and regular rhythm.                 Pulmonary/Chest: Effort normal and breath sounds without rales or wheezing.                Abd:  Soft, NT, ND, + BS, no organomegaly               Neurological: Pt is alert. At baseline orientation, motor grossly intact               Skin: Skin is warm. No rashes, no other new lesions, LE edema - none               Psychiatric: Pt behavior is normal without agitation but sad demeanor   Micro: none  Cardiac tracings I have personally interpreted today:  none  Pertinent Radiological findings (summarize): none   Lab Results  Component Value Date   WBC 7.7 11/28/2022   HGB 12.3 11/28/2022   HCT 36.7 11/28/2022   PLT 117.0 Repeated and verified X2. (L) 11/28/2022   GLUCOSE 103 (H) 11/28/2022   CHOL 80 11/28/2022   TRIG 55.0 11/28/2022   HDL 51.20 11/28/2022   LDLDIRECT 164.8 01/17/2010   LDLCALC 18 11/28/2022   ALT 13 11/28/2022   AST 18 11/28/2022   NA 135 11/28/2022   K 4.9 11/28/2022   CL 104 11/28/2022   CREATININE 1.27 (H) 11/28/2022   BUN 13 11/28/2022   CO2 26 11/28/2022   TSH 1.41 11/28/2022   INR 0.95 05/19/2016   HGBA1C 6.3 11/28/2022   Assessment/Plan:  Elizabeth [name] is a 72 y.o. Black or African American [2] female with  has a past medical history of Abrasion or friction burn of foot and toe(s), with infection, Allergy, Anxiety, CAD (coronary artery disease), Degenerative joint disease, Hyperlipidemia, Hypertension, Myocardial infarction [facility]), Osteoporosis (06/05/2017), Sickle cell trait ([place]), Stroke [facility]) (2017), and Tobacco abuse (April, 2011).  Benign essential HTN BP Readings from Last 3 Encounters:  11/28/22 116/62  09/10/22 113/68  06/03/22 120/70   Stable, pt to continue medical treatment  Lopressor 50 bid  B12 deficiency Lab  Results  Component Value Date   [id number] 11/28/2022   Stable, cont oral replacement - b12 1000 mcg qd   Hyperglycemia Lab Results  Component Value Date   HGBA1C 6.3 11/28/2022   Stable, pt to continue current medical treatment  - diet, wt control   Vitamin D deficiency Last vitamin D Lab Results  Component Value Date   VD25OH 35.04 11/28/2022   Low, to start oral replacement   Tobacco abuse Pt counsled to quit, pt not ready  Followup: Return in about 6 months (around 05/31/2023) for follow up.  Cathlean Cower, MD 11/30/2022 2:17 PM [facility]

## 2022-11-28 NOTE — Patient Instructions (Addendum)
You had the flu shot today  Please quit smoking  Please continue all other medications as before, and refills have been done if requested.  Please have the pharmacy call with any other refills you may need.  Please continue your efforts at being more active, low cholesterol diet, and weight control.  You are otherwise up to date with prevention measures today.  Please keep your appointments with your specialists as you may have planned  Please go to the LAB at the blood drawing area for the tests to be done  You will be contacted by phone if any changes need to be made immediately.  Otherwise, you will receive a letter about your results with an explanation, but please check with MyChart first.  Please remember to sign up for MyChart if you have not done so, as this will be important to you in the future with finding out test results, communicating by private email, and scheduling acute appointments online when needed.  Please make an Appointment to return in 6 months, or sooner if needed

## 2022-11-28 NOTE — Assessment & Plan Note (Signed)
BP Readings from Last 3 Encounters:  11/28/22 116/62  09/10/22 113/68  06/03/22 120/70   Stable, pt to continue medical treatment  Lopressor 50 bid

## 2022-11-30 ENCOUNTER — Encounter: Payer: Self-pay | Admitting: Internal Medicine

## 2022-11-30 NOTE — Assessment & Plan Note (Signed)
Lab Results  Component Value Date   HGBA1C 6.3 11/28/2022   Stable, pt to continue current medical treatment  - diet, wt control

## 2022-11-30 NOTE — Assessment & Plan Note (Signed)
Pt counsled to quit, pt not ready °

## 2022-11-30 NOTE — Addendum Note (Signed)
Addended by: Biagio Borg on: 11/30/2022 02:18 PM   Modules accepted: Level of Service

## 2022-11-30 NOTE — Assessment & Plan Note (Signed)
Last vitamin D Lab Results  Component Value Date   VD25OH 35.04 11/28/2022   Low, to start oral replacement

## 2022-11-30 NOTE — Assessment & Plan Note (Signed)
Lab Results  Component Value Date   [id number] 11/28/2022   Stable, cont oral replacement - b12 1000 mcg qd

## 2022-12-17 ENCOUNTER — Telehealth: Payer: Self-pay

## 2022-12-17 NOTE — Telephone Encounter (Signed)
Contacted Elizabeth [name] to schedule their annual wellness visit. Appointment made for 12/18/22.  Norton Blizzard, [place] ([facility])  [facility] [phone]

## 2022-12-18 ENCOUNTER — Ambulatory Visit (INDEPENDENT_AMBULATORY_CARE_PROVIDER_SITE_OTHER): Payer: Medicare Other

## 2022-12-18 VITALS — Ht 63.0 in | Wt 129.0 lb

## 2022-12-18 DIAGNOSIS — Z1211 Encounter for screening for malignant neoplasm of colon: Secondary | ICD-10-CM

## 2022-12-18 DIAGNOSIS — Z0000 Encounter for general adult medical examination without abnormal findings: Secondary | ICD-10-CM

## 2022-12-18 NOTE — Progress Notes (Signed)
I connected with  Elizabeth [name] on 12/18/22 by a audio enabled telemedicine application and verified that I am speaking with the correct person using two identifiers.  Patient Location: Home  Provider Location: Office/Clinic  I discussed the limitations of evaluation and management by telemedicine. The patient expressed understanding and agreed to proceed.  Subjective:   Elizabeth [name] is a 72 y.o. female who presents for an Initial Medicare Annual Wellness Visit.  Review of Systems     Cardiac Risk Factors include: advanced age (>28men, >12 women);dyslipidemia;family history of premature cardiovascular disease;hypertension;sedentary lifestyle     Objective:    Today's Vitals   12/18/22 1405  Weight: 129 lb (58.5 kg)  Height: 5\' 3"  (1.6 m)  PainSc: 0-No pain   Body mass index is 22.85 kg/m.     12/18/2022    2:06 PM 05/19/2022   10:00 AM 07/11/2016   10:32 AM 06/13/2016   11:09 AM 06/11/2016   10:21 AM 05/30/2016    8:05 AM 05/28/2016    2:06 PM  Advanced Directives  Does Patient Have a Medical Advance Directive? No No  No No No No  Would patient like information on creating a medical advance directive? No - Patient declined No - Patient declined Yes - Educational materials given No - patient declined information No - patient declined information No - patient declined information No - patient declined information    Current Medications (verified) Outpatient Encounter Medications as of 12/18/2022  Medication Sig   albuterol (VENTOLIN HFA) 108 (90 Base) MCG/ACT inhaler Inhale 2 puffs into the lungs every 6 (six) hours as needed for wheezing or shortness of breath.   alendronate (FOSAMAX) 70 MG tablet Take 1 tablet (70 mg total) by mouth every 7 (seven) days. Take with a full glass of water on an empty stomach.   aspirin EC 81 MG tablet Take 1 tablet (81 mg total) by mouth daily. Swallow whole.   clopidogrel (PLAVIX) 75 MG tablet Take 1 tablet (75 mg total) by mouth daily.    escitalopram (LEXAPRO) 10 MG tablet Take 1 tablet (10 mg total) by mouth daily.   Evolocumab (REPATHA SURECLICK) [id number] MG/ML SOAJ Inject 140 mg into the skin every 14 (fourteen) days.   ezetimibe (ZETIA) 10 MG tablet Take 1 tablet (10 mg total) by mouth daily.   isosorbide mononitrate (IMDUR) 30 MG 24 hr tablet Take 1 tablet (30 mg total) by mouth at bedtime.   metoprolol tartrate (LOPRESSOR) 50 MG tablet Take 1 tablet (50 mg total) by mouth 2 (two) times daily.   nitroGLYCERIN (NITROLINGUAL) 0.4 MG/SPRAY spray Place 1 spray under the tongue every 5 (five) minutes as needed. May repeat up to 3 doses.   rosuvastatin (CRESTOR) 40 MG tablet Take 1 tablet (40 mg total) by mouth daily. Please d/c atorvastatin   telmisartan (MICARDIS) 80 MG tablet 1 tab by mouth once daily   vitamin B-12 (CYANOCOBALAMIN) 1000 MCG tablet Take 1 tablet (1,000 mcg total) by mouth daily.   No facility-administered encounter medications on file as of 12/18/2022.    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; groin hematoma and lower abdominal hematoma then   Degenerative joint disease  Generalized   Hyperlipidemia    Hypertension    Myocardial infarction ([place])    x3, last MI was 6 years ago per pt/Stent placed   Osteoporosis 06/05/2017   Sickle cell trait ([place])    Stroke [facility]) 2017   Tobacco abuse April, 2011   Continued   Past Surgical History:  Procedure Laterality Date   ANGIOPLASTY  2006   x3 stents   COLONOSCOPY  05/20/2011   CORONARY ATHERECTOMY N/A 05/22/2022   Procedure: CORONARY ATHERECTOMY;  Surgeon: Burnell Blanks, MD;  Location: [place] [facility];  Service: Cardiovascular;  Laterality: N/A;   CORONARY STENT INTERVENTION N/A  05/22/2022   Procedure: CORONARY STENT INTERVENTION;  Surgeon: Burnell Blanks, MD;  Location: [place] [facility];  Service: Cardiovascular;  Laterality: N/A;   DILATION AND CURETTAGE OF UTERUS     EP IMPLANTABLE DEVICE N/A 05/21/2016   Procedure: Loop Recorder Insertion;  Surgeon: Thompson Grayer, MD;  Location: [place] [facility];  Service: Cardiovascular;  Laterality: N/A;   FOOT SURGERY  2000   LEFT HEART CATH AND CORONARY ANGIOGRAPHY N/A 05/20/2022   Procedure: LEFT HEART CATH AND CORONARY ANGIOGRAPHY;  Surgeon: Burnell Blanks, MD;  Location: [place] [facility];  Service: Cardiovascular;  Laterality: N/A;   LITHOTRIPSY  2008   left kidney stone and also ureteral stent placed   POLYPECTOMY     TEE WITHOUT CARDIOVERSION N/A 05/21/2016   Procedure: TRANSESOPHAGEAL ECHOCARDIOGRAM (TEE);  Surgeon: Lelon Perla, MD;  Location: [facility] ENDOSCOPY;  Service: Cardiovascular;  Laterality: N/A;   Family History  Problem Relation Age of Onset   Coronary artery disease Mother    Stroke Mother    Coronary artery disease Father    Heart disease Brother    Colon cancer Neg Hx    Colon polyps Neg Hx    Esophageal cancer Neg Hx    Stomach cancer Neg Hx    Rectal cancer Neg Hx    Social History   Socioeconomic History   Marital status: Married    Spouse name: Not on file   Number of children: Not on file   Years of education: Not on file   Highest education level: Not on file  Occupational History   Not on file  Tobacco Use   Smoking status: Every Day    Packs/day: .5    Types: Cigarettes   Smokeless tobacco: Never   Tobacco comments:    Ongoing since 04/2005  Vaping Use   Vaping Use: Never used  Substance and Sexual Activity   Alcohol use: No   Drug use: No   Sexual activity: Not on file  Other Topics Concern   Not on file  Social History Narrative   Not on file   Social Determinants of Health   Financial Resource Strain: Low Risk  (12/18/2022)   Overall Financial  Resource Strain (CARDIA)    Difficulty of Paying Living Expenses: Not hard at all  Food Insecurity: No Food Insecurity (12/18/2022)   Hunger Vital Sign    Worried About Running Out of Food in the Last Year: Never true    Ran Out of Food in the Last Year: Never true  Transportation Needs: No Transportation Needs (12/18/2022)   PRAPARE - Hydrologist (Medical): No    Lack of Transportation (Non-Medical): No  Physical Activity: Inactive (12/18/2022)   Exercise Vital Sign    Days of Exercise per Week: 0 days    Minutes  of Exercise per Session: 0 min  Stress: No Stress Concern Present (12/18/2022)   [place]    Feeling of Stress : Not at all  Social Connections: [place] (12/18/2022)   Social Connection and Isolation Panel [NHANES]    Frequency of Communication with Friends and Family: More than three times a week    Frequency of Social Gatherings with Friends and Family: More than three times a week    Attends Religious Services: More than 4 times per year    Active Member of [employer] or Organizations: Yes    Attends Music therapist: More than 4 times per year    Marital Status: Married    Tobacco Counseling Ready to quit: Not Answered Counseling given: Not Answered Tobacco comments: Ongoing since 04/2005   Clinical Intake:  Pre-visit preparation completed: Yes  Pain : No/denies pain Pain Score: 0-No pain     BMI - recorded: 22.85 Nutritional Status: BMI of 19-24  Normal Nutritional Risks: None Diabetes: No  How often do you need to have someone help you when you read instructions, pamphlets, or other written materials from your doctor or pharmacy?: 1 - Never What is the last grade level you completed in school?: HSG  Diabetic? No  Interpreter Needed?: No  Information entered by :: Lisette Abu, LPN.   Activities of Daily Living    12/18/2022     2:11 PM 05/21/2022    8:15 PM  In your present state of health, do you have any difficulty performing the following activities:  Hearing? 0 0  Vision? 0 0  Difficulty concentrating or making decisions? 0 0  Walking or climbing stairs? 0 0  Dressing or bathing? 0 0  Doing errands, shopping? 0 0  Preparing Food and eating ? N   Using the Toilet? N   In the past six months, have you accidently leaked urine? N   Do you have problems with loss of bowel control? N   Managing your Medications? N   Managing your Finances? N   Housekeeping or managing your Housekeeping? N     Patient Care Team: Biagio Borg, MD as PCP - General (Internal Medicine) Harl Bowie Royetta Crochet, MD as PCP - Cardiology (Cardiology)  Indicate any recent Medical Services you may have received from other than [facility] providers in the past year (date may be approximate).     Assessment:   This is a routine wellness examination for Antonique.  Hearing/Vision screen Hearing Screening - Comments:: Denies hearing difficulties   Vision Screening - Comments:: Wears rx glasses - not up to date with routine eye exams.   Dietary issues and exercise activities discussed: Current Exercise Habits: The patient does not participate in regular exercise at present, Exercise limited by: respiratory conditions(s);neurologic condition(s);cardiac condition(s) (Left side hemiparesis)   Goals Addressed             This Visit's Progress    Client understands the importance of follow-up with providers by attending scheduled visits       Patient has no healthcare goals at this time.      Depression Screen    12/18/2022    2:13 PM 11/28/2022   11:15 AM 11/28/2022   10:45 AM 05/30/2022    9:09 AM 05/15/2022   11:48 AM 05/15/2022   11:29 AM 07/06/2021   10:43 AM  PHQ 2/9 Scores  PHQ - 2 Score 0 0 3 0 1 0 0  PHQ- 9 Score 3  4 0  0     Fall Risk    12/18/2022    2:07 PM 11/28/2022   10:45 AM 05/30/2022    9:10 AM 05/15/2022   11:47 AM 05/15/2022    11:29 AM  Fall Risk   Falls in the past year? 0 0 0 0 0  Number falls in past yr: 0 0  0   Injury with Fall? 0 0 0 0 0  Risk for fall due to : No Fall Risks No Fall Risks No Fall Risks  No Fall Risks  Follow up Falls prevention discussed Falls evaluation completed;Education provided Falls evaluation completed  Falls evaluation completed    FALL RISK PREVENTION PERTAINING TO THE HOME:  Any stairs in or around the home? No  If so, are there any without handrails? No  Home free of loose throw rugs in walkways, pet beds, electrical cords, etc? Yes  Adequate lighting in your home to reduce risk of falls? Yes   ASSISTIVE DEVICES UTILIZED TO PREVENT FALLS:  Life alert? No  Use of a cane, walker or w/c? No  Grab bars in the bathroom? No  Shower chair or bench in shower? Yes  Elevated toilet seat or a handicapped toilet? Yes   TIMED UP AND GO:  Was the test performed? No . Telephonic Visit  Cognitive Function:        12/18/2022    2:09 PM  6CIT Screen  What Year? 0 points  What month? 0 points  What time? 0 points  Count back from 20 0 points  Months in reverse 0 points  Repeat phrase 0 points  Total Score 0 points    Immunizations Immunization History  Administered Date(s) Administered   Fluad Quad(high Dose 65+) 06/02/2019, 07/05/2020, 07/06/2021, 11/28/2022   Influenza, High Dose Seasonal PF 05/22/2017, 06/24/2018   PFIZER(Purple Top)SARS-COV-2 Vaccination 10/28/2019, 11/18/2019, 07/05/2020   Pneumococcal Conjugate-13 06/05/2017   Pneumococcal Polysaccharide-23 12/19/2017   Tdap 04/02/2011   Zoster, Live 10/21/2011    TDAP status: Due, Education has been provided regarding the importance of this vaccine. Advised may receive this vaccine at local pharmacy or Health Dept. Aware to provide a copy of the vaccination record if obtained from local pharmacy or Health Dept. Verbalized acceptance and understanding.  Flu Vaccine status: Up to date  Pneumococcal vaccine  status: Up to date  Covid-19 vaccine status: Completed vaccines  Qualifies for Shingles Vaccine? Yes   Zostavax completed Yes   Shingrix Completed?: No.    Education has been provided regarding the importance of this vaccine. Patient has been advised to call insurance company to determine out of pocket expense if they have not yet received this vaccine. Advised may also receive vaccine at local pharmacy or Health Dept. Verbalized acceptance and understanding.  Screening Tests Health Maintenance  Topic Date Due   MAMMOGRAM  06/18/2019   DTaP/Tdap/Td (2 - Td or Tdap) 04/01/2021   Zoster Vaccines- Shingrix (1 of 2) 02/28/2023 (Originally 02/15/2001)   Fecal DNA (Cologuard)  11/28/2023 (Originally 06/16/2020)   Medicare Annual Wellness (AWV)  12/18/2023   Pneumonia Vaccine 74+ Years old  Completed   INFLUENZA VACCINE  Completed   DEXA SCAN  Completed   Hepatitis C Screening  Completed   HPV VACCINES  Aged Out   COVID-19 Vaccine  Discontinued    Health Maintenance  Health Maintenance Due  Topic Date Due   MAMMOGRAM  06/18/2019   DTaP/Tdap/Td (2 - Td or Tdap) 04/01/2021  Colorectal cancer screening: Referral to GI placed 12/18/2022. Pt aware the office will call re: appt.  Mammogram status: Ordered 06/10/2022. Pt provided with contact info and advised to call to schedule appt.   Bone Density status: Completed 06/05/2017. Results reflect: Bone density results: OSTEOPOROSIS. Repeat every 2 years.  Lung Cancer Screening: (Low Dose CT Chest recommended if Age 70-80 years, 30 pack-year currently smoking OR have quit w/in 15years.) does not qualify.   Lung Cancer Screening Referral: no  Additional Screening:  Hepatitis C Screening: does qualify; Completed 05/29/2016  Vision Screening: Recommended annual ophthalmology exams for early detection of glaucoma and other disorders of the eye. Is the patient up to date with their annual eye exam?  No  Who is the provider or what is the name  of the office in which the patient attends annual eye exams? Declined If pt is not established with a provider, would they like to be referred to a provider to establish care? No .   Dental Screening: Recommended annual dental exams for proper oral hygiene  Community Resource Referral / Chronic Care Management: CRR required this visit?  No   CCM required this visit?  No      Plan:     I have personally reviewed and noted the following in the patient's chart:   Medical and social history Use of alcohol, tobacco or illicit drugs  Current medications and supplements including opioid prescriptions. Patient is not currently taking opioid prescriptions. Functional ability and status Nutritional status Physical activity Advanced directives List of other physicians Hospitalizations, surgeries, and ER visits in previous 12 months Vitals Screenings to include cognitive, depression, and falls Referrals and appointments  In addition, I have reviewed and discussed with patient certain preventive protocols, quality metrics, and best practice recommendations. A written personalized care plan for preventive services as well as general preventive health recommendations were provided to patient.     Sheral Flow, LPN   [id number]   Nurse Notes: Normal cognitive status assessed by direct observation by this Nurse Health Advisor. No abnormalities found.

## 2022-12-18 NOTE — Patient Instructions (Addendum)
Elizabeth [name] , Thank you for taking time to come for your Medicare Wellness Visit. I appreciate your ongoing commitment to your health goals. Please review the following plan we discussed and let me know if I can assist you in the future.   These are the goals we discussed:  Goals      Client understands the importance of follow-up with providers by attending scheduled visits     Patient has no healthcare goals at this time.        This is a list of the screening recommended for you and due dates:  Health Maintenance  Topic Date Due   Mammogram  06/18/2019   DTaP/Tdap/Td vaccine (2 - Td or Tdap) 04/01/2021   Zoster (Shingles) Vaccine (1 of 2) 02/28/2023*   Cologuard (Stool DNA test)  11/28/2023*   Medicare Annual Wellness Visit  12/18/2023   Pneumonia Vaccine  Completed   Flu Shot  Completed   DEXA scan (bone density measurement)  Completed   Hepatitis C Screening: USPSTF Recommendation to screen - Ages 54-79 yo.  Completed   HPV Vaccine  Aged Out   COVID-19 Vaccine  Discontinued  *Topic was postponed. The date shown is not the original due date.    Advanced directives: No; Advance directive discussed with you today. Even though you declined this today please call our office should you change your mind and we can give you the proper paperwork for you to fill out.  Conditions/risks identified: Yes; Order placed for Screening Colonscopy and Mammogram.  Next appointment: Follow up in one year for your annual wellness visit.   Preventive Care 32 Years and Older, Female Preventive care refers to lifestyle choices and visits with your health care provider that can promote health and wellness. What does preventive care include? A yearly physical exam. This is also called an annual well check. Dental exams once or twice a year. Routine eye exams. Ask your health care provider how often you should have your eyes checked. Personal lifestyle choices, including: Daily care of your teeth  and gums. Regular physical activity. Eating a healthy diet. Avoiding tobacco and drug use. Limiting alcohol use. Practicing safe sex. Taking low-dose aspirin every day. Taking vitamin and mineral supplements as recommended by your health care provider. What happens during an annual well check? The services and screenings done by your health care provider during your annual well check will depend on your age, overall health, lifestyle risk factors, and family history of disease. Counseling  Your health care provider may ask you questions about your: Alcohol use. Tobacco use. Drug use. Emotional well-being. Home and relationship well-being. Sexual activity. Eating habits. History of falls. Memory and ability to understand (cognition). Work and work Statistician. Reproductive health. Screening  You may have the following tests or measurements: Height, weight, and BMI. Blood pressure. Lipid and cholesterol levels. These may be checked every 5 years, or more frequently if you are over 23 years old. Skin check. Lung cancer screening. You may have this screening every year starting at age 61 if you have a 30-pack-year history of smoking and currently smoke or have quit within the past 15 years. Fecal occult blood test (FOBT) of the stool. You may have this test every year starting at age 17. Flexible sigmoidoscopy or colonoscopy. You may have a sigmoidoscopy every 5 years or a colonoscopy every 10 years starting at age 82. Hepatitis C blood test. Hepatitis B blood test. Sexually transmitted disease (STD) testing. Diabetes screening. This is  done by checking your blood sugar (glucose) after you have not eaten for a while (fasting). You may have this done every 1-3 years. Bone density scan. This is done to screen for osteoporosis. You may have this done starting at age 21. Mammogram. This may be done every 1-2 years. Talk to your health care provider about how often you should have regular  mammograms. Talk with your health care provider about your test results, treatment options, and if necessary, the need for more tests. Vaccines  Your health care provider may recommend certain vaccines, such as: Influenza vaccine. This is recommended every year. Tetanus, diphtheria, and acellular pertussis (Tdap, Td) vaccine. You may need a Td booster every 10 years. Zoster vaccine. You may need this after age 56. Pneumococcal 13-valent conjugate (PCV13) vaccine. One dose is recommended after age 80. Pneumococcal polysaccharide (PPSV23) vaccine. One dose is recommended after age 67. Talk to your health care provider about which screenings and vaccines you need and how often you need them. This information is not intended to replace advice given to you by your health care provider. Make sure you discuss any questions you have with your health care provider. Document Released: 10/06/2015 Document Revised: 05/29/2016 Document Reviewed: 07/11/2015 Elsevier Interactive Patient Education  2017 [place] Prevention in the Home Falls can cause injuries. They can happen to people of all ages. There are many things you can do to make your home safe and to help prevent falls. What can I do on the outside of my home? Regularly fix the edges of walkways and driveways and fix any cracks. Remove anything that might make you trip as you walk through a door, such as a raised step or threshold. Trim any bushes or trees on the path to your home. Use bright outdoor lighting. Clear any walking paths of anything that might make someone trip, such as rocks or tools. Regularly check to see if handrails are loose or broken. Make sure that both sides of any steps have handrails. Any raised decks and porches should have guardrails on the edges. Have any leaves, snow, or ice cleared regularly. Use sand or salt on walking paths during winter. Clean up any spills in your garage right away. This includes oil  or grease spills. What can I do in the bathroom? Use night lights. Install grab bars by the toilet and in the tub and shower. Do not use towel bars as grab bars. Use non-skid mats or decals in the tub or shower. If you need to sit down in the shower, use a plastic, non-slip stool. Keep the floor dry. Clean up any water that spills on the floor as soon as it happens. Remove soap buildup in the tub or shower regularly. Attach bath mats securely with double-sided non-slip rug tape. Do not have throw rugs and other things on the floor that can make you trip. What can I do in the bedroom? Use night lights. Make sure that you have a light by your bed that is easy to reach. Do not use any sheets or blankets that are too big for your bed. They should not hang down onto the floor. Have a firm chair that has side arms. You can use this for support while you get dressed. Do not have throw rugs and other things on the floor that can make you trip. What can I do in the kitchen? Clean up any spills right away. Avoid walking on wet floors. Keep items that you  use a lot in easy-to-reach places. If you need to reach something above you, use a strong step stool that has a grab bar. Keep electrical cords out of the way. Do not use floor polish or wax that makes floors slippery. If you must use wax, use non-skid floor wax. Do not have throw rugs and other things on the floor that can make you trip. What can I do with my stairs? Do not leave any items on the stairs. Make sure that there are handrails on both sides of the stairs and use them. Fix handrails that are broken or loose. Make sure that handrails are as long as the stairways. Check any carpeting to make sure that it is firmly attached to the stairs. Fix any carpet that is loose or worn. Avoid having throw rugs at the top or bottom of the stairs. If you do have throw rugs, attach them to the floor with carpet tape. Make sure that you have a light  switch at the top of the stairs and the bottom of the stairs. If you do not have them, ask someone to add them for you. What else can I do to help prevent falls? Wear shoes that: Do not have high heels. Have rubber bottoms. Are comfortable and fit you well. Are closed at the toe. Do not wear sandals. If you use a stepladder: Make sure that it is fully opened. Do not climb a closed stepladder. Make sure that both sides of the stepladder are locked into place. Ask someone to hold it for you, if possible. Clearly mark and make sure that you can see: Any grab bars or handrails. First and last steps. Where the edge of each step is. Use tools that help you move around (mobility aids) if they are needed. These include: Canes. Walkers. Scooters. Crutches. Turn on the lights when you go into a dark area. Replace any light bulbs as soon as they burn out. Set up your furniture so you have a clear path. Avoid moving your furniture around. If any of your floors are uneven, fix them. If there are any pets around you, be aware of where they are. Review your medicines with your doctor. Some medicines can make you feel dizzy. This can increase your chance of falling. Ask your doctor what other things that you can do to help prevent falls. This information is not intended to replace advice given to you by your health care provider. Make sure you discuss any questions you have with your health care provider. Document Released: 07/06/2009 Document Revised: 02/15/2016 Document Reviewed: 10/14/2014 Elsevier Interactive Patient Education  2017 [employer].

## 2023-01-14 ENCOUNTER — Other Ambulatory Visit: Payer: Self-pay | Admitting: Internal Medicine

## 2023-01-31 ENCOUNTER — Other Ambulatory Visit: Payer: Self-pay

## 2023-01-31 MED ORDER — ATORVASTATIN CALCIUM 80 MG PO TABS: 80.0000 mg | ORAL_TABLET | Freq: Every day | ORAL | 3 refills | Status: DC

## 2023-02-10 ENCOUNTER — Other Ambulatory Visit: Payer: Self-pay | Admitting: Student

## 2023-02-24 ENCOUNTER — Ambulatory Visit: Payer: Medicare Other | Attending: Internal Medicine | Admitting: Internal Medicine

## 2023-02-24 ENCOUNTER — Encounter: Payer: Self-pay | Admitting: Internal Medicine

## 2023-02-24 VITALS — BP 110/72 | HR 56 | Ht 63.0 in | Wt 127.4 lb

## 2023-02-24 DIAGNOSIS — I2510 Atherosclerotic heart disease of native coronary artery without angina pectoris: Secondary | ICD-10-CM | POA: Diagnosis not present

## 2023-02-24 MED ORDER — CLOPIDOGREL BISULFATE 75 MG PO TABS: 75.0000 mg | ORAL_TABLET | Freq: Every day | ORAL | 2 refills | Status: DC

## 2023-02-24 MED ORDER — METOPROLOL SUCCINATE ER 25 MG PO TB24: 12.5000 mg | ORAL_TABLET | Freq: Every day | ORAL | 3 refills | Status: DC

## 2023-02-24 NOTE — Progress Notes (Signed)
Cardiology Office Note:    Date:  02/24/2023   ID:  Elizabeth [name], DOB [birth year], MRN [id number]  PCP:  Corwin Levins, MD   [facility] Providers Cardiologist:  Maisie Fus, MD     Referring MD: Corwin Levins, MD   No chief complaint on file. CAD  History of Present Illness:    Elizabeth [name] is a 72 y.o. female with a hx  CAD (s/p PTCA to RCA and LCx in 2001, STEMI in 2006 with DES to RCA and 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), HTN, HLD, tobacco use and prior CVA (still has ILR in place, no afib) who is being seen 05/19/2022. She was having intermittent CP and shoulder pain for 1 week. Echo showed normal LV function. MR more moderate. Had LHC that showed  highly calcified distal right coronary artery lesion, high-grade in-stent restenosis of the previously placed PDA stent, moderate in-stent restenosis of the mid right coronary artery stent, possible high-grade lesion of the RPLV Elizabeth [name], high-grade diagonal disease and high-grade circumflex disease as well as OM1 disease. She was started on ticagrelor and scheduled for PCI. She underwent an uncomplicated PCI atherectomy of distal right coronary artery.  Circ dx was not amenable to PCI, she had no good surgical targets.  Was planned to continue ticag however for cost reasons switched to plavix.   Interim Hx 09/10/2022 No issues with her medications. BP is well controlled. Pending patient assistance for her repatha.  Otherwise, she walks for exercise. She denies chest pain. She can get tired.  No SOB. No orthopnea or PND. No LE edema.  Interim 02/24/2023 She walks and feels good. She notes she has some low energy. She is planting flowers. She denies chest pain. Started repatha , for free.   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, 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; groin hematoma and lower abdominal  hematoma then   Degenerative joint disease    Generalized   Hyperlipidemia    Hypertension    Myocardial infarction (HCC)    x3, last MI was 6 years ago per pt/Stent placed   Osteoporosis 06/05/2017   Sickle cell trait (HCC)    Stroke (HCC) 2017   Tobacco abuse April, 2011   Continued    Past Surgical History:  Procedure Laterality Date   ANGIOPLASTY  2006   x3 stents   COLONOSCOPY  05/20/2011   CORONARY ATHERECTOMY N/A 05/22/2022   Procedure: CORONARY ATHERECTOMY;  Surgeon: Kathleene Hazel, MD;  [facility]: [facility];  Service: Cardiovascular;  Laterality: N/A;   CORONARY STENT INTERVENTION N/A 05/22/2022   Procedure: CORONARY STENT INTERVENTION;  Surgeon: Kathleene Hazel, MD;  Location: [facility];  Service: Cardiovascular;  Laterality: N/A;   DILATION AND CURETTAGE OF UTERUS     EP IMPLANTABLE DEVICE N/A 05/21/2016   Procedure: Loop Recorder Insertion;  Surgeon: Hillis Range, MD;  Location: [facility];  Service: Cardiovascular;  Laterality: N/A;   FOOT SURGERY  2000   LEFT HEART CATH AND CORONARY ANGIOGRAPHY N/A 05/20/2022   Procedure: LEFT HEART CATH AND CORONARY ANGIOGRAPHY;  Surgeon: Kathleene Hazel, MD;  Location: [facility];  Service: Cardiovascular;  Laterality: N/A;   LITHOTRIPSY  2008   left kidney stone and also  ureteral stent placed   POLYPECTOMY     TEE WITHOUT CARDIOVERSION N/A 05/21/2016   Procedure: TRANSESOPHAGEAL ECHOCARDIOGRAM (TEE);  Surgeon: Lewayne Bunting, MD;  Location: [facility] ENDOSCOPY;  Service: Cardiovascular;  Laterality: N/A;    Current Medications: Current Meds  Medication Sig   albuterol (VENTOLIN HFA) 108 (90 Base) MCG/ACT inhaler Inhale 2 puffs into the lungs every 6 (six) hours as needed for wheezing or shortness of breath.   alendronate (FOSAMAX) 70 MG tablet Take 1 tablet (70 mg total) by mouth every 7 (seven) days. Take with a full glass of water on an empty stomach.   aspirin EC 81 MG tablet  Take 1 tablet (81 mg total) by mouth daily. Swallow whole.   atorvastatin (LIPITOR) 80 MG tablet Take 1 tablet (80 mg total) by mouth daily.   escitalopram (LEXAPRO) 10 MG tablet Take 1 tablet (10 mg total) by mouth daily.   Evolocumab (REPATHA SURECLICK) 140 MG/ML SOAJ Inject 140 mg into the skin every 14 (fourteen) days.   ezetimibe (ZETIA) 10 MG tablet Take 1 tablet (10 mg total) by mouth daily.   isosorbide mononitrate (IMDUR) 30 MG 24 hr tablet Take 1 tablet (30 mg total) by mouth at bedtime.   metoprolol succinate (TOPROL XL) 25 MG 24 hr tablet Take 0.5 tablets (12.5 mg total) by mouth daily.   nitroGLYCERIN (NITROLINGUAL) 0.4 MG/SPRAY spray Place 1 spray under the tongue every 5 (five) minutes as needed. May repeat up to 3 doses.   rosuvastatin (CRESTOR) 40 MG tablet Take 1 tablet (40 mg total) by mouth daily. Please d/c atorvastatin   telmisartan (MICARDIS) 80 MG tablet 1 tab by mouth once daily   vitamin B-12 (CYANOCOBALAMIN) 1000 MCG tablet Take 1 tablet (1,000 mcg total) by mouth daily.   [DISCONTINUED] clopidogrel (PLAVIX) 75 MG tablet TAKE 1 TABLET BY MOUTH EVERY DAY     Allergies:   Shrimp [shellfish allergy], Adhesive [tape], and Soy allergy   Social History   Socioeconomic History   Marital status: Married    Spouse name: Not on file   Number of children: Not on file   Years of education: Not on file   Highest education level: Not on file  Occupational History   Not on file  Tobacco Use   Smoking status: Every Day    Packs/day: .5    Types: Cigarettes   Smokeless tobacco: Never   Tobacco comments:    Ongoing since 04/2005  Vaping Use   Vaping Use: Never used  Substance and Sexual Activity   Alcohol use: No   Drug use: No   Sexual activity: Not on file  Other Topics Concern   Not on file  Social History Narrative   Not on file   Social Determinants of Health   Financial Resource Strain: Low Risk  (12/18/2022)   Overall Financial Resource Strain (CARDIA)     Difficulty of Paying Living Expenses: Not hard at all  Food Insecurity: No Food Insecurity (12/18/2022)   Hunger Vital Sign    Worried About Running Out of Food in the Last Year: Never true    Ran Out of Food in the Last Year: Never true  Transportation Needs: No Transportation Needs (12/18/2022)   PRAPARE - Administrator, Civil Service (Medical): No    Lack of Transportation (Non-Medical): No  Physical Activity: Inactive (12/18/2022)   Exercise Vital Sign    Days of Exercise per Week: 0 days    Minutes of Exercise per  Session: 0 min  Stress: No Stress Concern Present (12/18/2022)   [employer] of Occupational Health - Occupational Stress Questionnaire    Feeling of Stress : Not at all  Social Connections: Socially Integrated (12/18/2022)   Social Connection and Isolation Panel [NHANES]    Frequency of Communication with Friends and Family: More than three times a week    Frequency of Social Gatherings with Friends and Family: More than three times a week    Attends Religious Services: More than 4 times per year    Active Member of [employer] or Organizations: Yes    Attends Engineer, structural: More than 4 times per year    Marital Status: Married     Family History: The patient's family history includes Coronary artery disease in her father and mother; Heart disease in her brother; Stroke in her mother. There is no history of Colon cancer, Colon polyps, Esophageal cancer, Stomach cancer, or Rectal cancer.  ROS:   Please see the history of present illness.     All other systems reviewed and are negative.  EKGs/Labs/Other Studies Reviewed:    The following studies were reviewed today:  02/24/2023-Sinus bradycardia 59 bpm  Recent Labs: 05/19/2022: B Natriuretic Peptide 430.5 11/28/2022: ALT 13; BUN 13; Creatinine, Ser 1.27; Hemoglobin 12.3; Platelets 117.0 Repeated and verified X2.; Potassium 4.9; Sodium 135; TSH 1.41   Recent Lipid Panel    Component Value  Date/Time   CHOL 80 11/28/2022 1133   TRIG 55.0 11/28/2022 1133   HDL 51.20 11/28/2022 1133   CHOLHDL 2 11/28/2022 1133   VLDL 11.0 11/28/2022 1133   LDLCALC 18 11/28/2022 1133   LDLDIRECT 164.8 01/17/2010 0911     Risk Assessment/Calculations:         Physical Exam:    VS:   Vitals:   02/24/23 0902  BP: 110/72  Pulse: (!) 56  SpO2: 98%     Wt Readings from Last 3 Encounters:  02/24/23 127 lb 6.4 oz (57.8 kg)  12/18/22 129 lb (58.5 kg)  11/28/22 129 lb (58.5 kg)     GEN:  Well nourished, well developed in no acute distress HEENT: Normal NECK: No JVD; No carotid bruits LYMPHATICS: No lymphadenopathy CARDIAC: RRR, no murmurs, rubs, gallops RESPIRATORY:  Clear to auscultation without rales, wheezing or rhonchi  ABDOMEN: Soft, non-tender, non-distended MUSCULOSKELETAL:  No edema; No deformity  SKIN: Warm and dry NEUROLOGIC:  Alert and oriented x 3 PSYCHIATRIC:  Normal affect   ASSESSMENT:    Coronary dx: distal RCA s/p PTCA/orbital atherectomy/DES 8/30/323. normal EF. continue DAPT (ASA/plavix)  until 05/23/2023 per interventional. Continue imdur 30 mg daily.   HLD: LDL 100, Lp(a) 184, on atorva 80 mg daily and zetia 10 mg daily. Being seen in lipid clinic with LDL goal < 55 mg dL. Continue repatha  Prior CVA: on dapt post PCI. Continue Repatha. No significant deficits  Mild-Moderate MR: continue surveillance. Last echo 04/2022. asymptomatic  HTN: well controlled. Metop XL 12.5 mg daily (was not taking prior tartrate?), continue telmisartan 80 mg PLAN:    In order of problems listed above:  Stop plavix 05/23/2023 Metop 12.5 mg XL daily  Follow up 6 months       Medication Adjustments/Labs and Tests Ordered: Current medicines are reviewed at length with the patient today.  Concerns regarding medicines are outlined above.  Orders Placed This Encounter  Procedures   EKG 12-Lead   Meds ordered this encounter  Medications   clopidogrel (PLAVIX) 75 MG  tablet    Sig: Take 1 tablet (75 mg total) by mouth daily.    Dispense:  90 tablet    Refill:  2    PT OUT OF REFILLS   metoprolol succinate (TOPROL XL) 25 MG 24 hr tablet    Sig: Take 0.5 tablets (12.5 mg total) by mouth daily.    Dispense:  45 tablet    Refill:  3    Patient Instructions  Medication Instructions:  STOP PLAVIX 05/23/2023 STOP METOPROLOL 50MG  START METOPROLOL 12.5  *If you need a refill on your cardiac medications before your next appointment, please call your pharmacy*  Follow-Up: At [facility] you and your health needs are our priority.  As part of our continuing mission to provide you with exceptional heart care, we have created designated Provider Care Teams.  These Care Teams include your primary Cardiologist (physician) and Advanced Practice Providers (APPs -  Physician Assistants and Nurse Practitioners) who all work together to provide you with the care you need, when you need it.   Your next appointment:   6 month(s)  Provider:   Maisie Fus, MD        Signed, Maisie Fus, MD  02/24/2023 9:43 AM    [facility]

## 2023-02-24 NOTE — Patient Instructions (Signed)
Medication Instructions:  STOP PLAVIX 05/23/2023 STOP METOPROLOL 50MG  START METOPROLOL 12.5  *If you need a refill on your cardiac medications before your next appointment, please call your pharmacy*  Follow-Up: At [facility], you and your health needs are our priority.  As part of our continuing mission to provide you with exceptional heart care, we have created designated Provider Care Teams.  These Care Teams include your primary Cardiologist (physician) and Advanced Practice Providers (APPs -  Physician Assistants and Nurse Practitioners) who all work together to provide you with the care you need, when you need it.   Your next appointment:   6 month(s)  Provider:   Maisie Fus, MD

## 2023-04-15 ENCOUNTER — Other Ambulatory Visit: Payer: Self-pay | Admitting: Student

## 2023-04-15 ENCOUNTER — Other Ambulatory Visit: Payer: Self-pay | Admitting: Internal Medicine

## 2023-05-08 ENCOUNTER — Encounter (INDEPENDENT_AMBULATORY_CARE_PROVIDER_SITE_OTHER): Payer: Self-pay

## 2023-05-22 ENCOUNTER — Other Ambulatory Visit: Payer: Self-pay | Admitting: Internal Medicine

## 2023-05-23 ENCOUNTER — Other Ambulatory Visit: Payer: Self-pay

## 2023-06-02 ENCOUNTER — Other Ambulatory Visit: Payer: Self-pay | Admitting: Internal Medicine

## 2023-06-02 ENCOUNTER — Encounter: Payer: Self-pay | Admitting: Internal Medicine

## 2023-06-02 ENCOUNTER — Ambulatory Visit (INDEPENDENT_AMBULATORY_CARE_PROVIDER_SITE_OTHER): Payer: Medicare Other | Admitting: Internal Medicine

## 2023-06-02 ENCOUNTER — Ambulatory Visit (INDEPENDENT_AMBULATORY_CARE_PROVIDER_SITE_OTHER): Payer: Medicare Other

## 2023-06-02 VITALS — BP 116/64 | HR 78 | Temp 98.2°F | Ht 63.0 in | Wt 124.0 lb

## 2023-06-02 DIAGNOSIS — R0689 Other abnormalities of breathing: Secondary | ICD-10-CM

## 2023-06-02 DIAGNOSIS — Z955 Presence of coronary angioplasty implant and graft: Secondary | ICD-10-CM | POA: Diagnosis not present

## 2023-06-02 DIAGNOSIS — I10 Essential (primary) hypertension: Secondary | ICD-10-CM

## 2023-06-02 DIAGNOSIS — N179 Acute kidney failure, unspecified: Secondary | ICD-10-CM

## 2023-06-02 DIAGNOSIS — E782 Mixed hyperlipidemia: Secondary | ICD-10-CM

## 2023-06-02 DIAGNOSIS — E559 Vitamin D deficiency, unspecified: Secondary | ICD-10-CM

## 2023-06-02 DIAGNOSIS — Z23 Encounter for immunization: Secondary | ICD-10-CM

## 2023-06-02 DIAGNOSIS — N289 Disorder of kidney and ureter, unspecified: Secondary | ICD-10-CM

## 2023-06-02 DIAGNOSIS — Z720 Tobacco use: Secondary | ICD-10-CM

## 2023-06-02 DIAGNOSIS — R739 Hyperglycemia, unspecified: Secondary | ICD-10-CM

## 2023-06-02 DIAGNOSIS — E538 Deficiency of other specified B group vitamins: Secondary | ICD-10-CM | POA: Diagnosis not present

## 2023-06-02 DIAGNOSIS — R0989 Other specified symptoms and signs involving the circulatory and respiratory systems: Secondary | ICD-10-CM | POA: Diagnosis not present

## 2023-06-02 LAB — CBC WITH DIFFERENTIAL/PLATELET
Basophils Absolute: 0.1 10*3/uL (ref 0.0–0.1)
Basophils Relative: 0.8 % (ref 0.0–3.0)
Eosinophils Absolute: 0.1 10*3/uL (ref 0.0–0.7)
Eosinophils Relative: 0.8 % (ref 0.0–5.0)
HCT: 41.2 % (ref 36.0–46.0)
Hemoglobin: 13.5 g/dL (ref 12.0–15.0)
Lymphocytes Relative: 34.6 % (ref 12.0–46.0)
Lymphs Abs: 2.9 10*3/uL (ref 0.7–4.0)
MCHC: 32.7 g/dL (ref 30.0–36.0)
MCV: 93.5 fl (ref 78.0–100.0)
Monocytes Absolute: 0.7 10*3/uL (ref 0.1–1.0)
Monocytes Relative: 8.8 % (ref 3.0–12.0)
Neutro Abs: 4.6 10*3/uL (ref 1.4–7.7)
Neutrophils Relative %: 55 % (ref 43.0–77.0)
Platelets: 141 10*3/uL — ABNORMAL LOW (ref 150.0–400.0)
RBC: 4.4 Mil/uL (ref 3.87–5.11)
RDW: 15.6 % — ABNORMAL HIGH (ref 11.5–15.5)
WBC: 8.4 10*3/uL (ref 4.0–10.5)

## 2023-06-02 LAB — URINALYSIS, ROUTINE W REFLEX MICROSCOPIC
Bilirubin Urine: NEGATIVE
Hgb urine dipstick: NEGATIVE
Ketones, ur: NEGATIVE
Leukocytes,Ua: NEGATIVE
Nitrite: NEGATIVE
RBC / HPF: NONE SEEN (ref 0–?)
Specific Gravity, Urine: <=1.005 — AB (ref 1.000–1.030)
Total Protein, Urine: NEGATIVE
Urine Glucose: NEGATIVE
Urobilinogen, UA: 0.2 (ref 0.0–1.0)
pH: 6 (ref 5.0–8.0)

## 2023-06-02 LAB — MICROALBUMIN / CREATININE URINE RATIO
Creatinine,U: 27.7 mg/dL
Microalb Creat Ratio: 5.6 mg/g (ref 0.0–30.0)
Microalb, Ur: 1.6 mg/dL (ref 0.0–1.9)

## 2023-06-02 LAB — VITAMIN B12: Vitamin B-12: 646 pg/mL (ref 211–911)

## 2023-06-02 LAB — HEPATIC FUNCTION PANEL
ALT: 14 U/L (ref 0–35)
AST: 17 U/L (ref 0–37)
Albumin: 4 g/dL (ref 3.5–5.2)
Alkaline Phosphatase: 79 U/L (ref 39–117)
Bilirubin, Direct: 0.2 mg/dL (ref 0.0–0.3)
Total Bilirubin: 0.6 mg/dL (ref 0.2–1.2)
Total Protein: 7 g/dL (ref 6.0–8.3)

## 2023-06-02 LAB — LIPID PANEL
Cholesterol: 76 mg/dL (ref 0–200)
HDL: 58.6 mg/dL (ref >39.00)
LDL Cholesterol: 9 mg/dL (ref 0–99)
NonHDL: 16.95
Total CHOL/HDL Ratio: 1
Triglycerides: 38 mg/dL (ref 0.0–149.0)
VLDL: 7.6 mg/dL (ref 0.0–40.0)

## 2023-06-02 LAB — BASIC METABOLIC PANEL
BUN: 10 mg/dL (ref 6–23)
CO2: 25 meq/L (ref 19–32)
Calcium: 10 mg/dL (ref 8.4–10.5)
Chloride: 105 meq/L (ref 96–112)
Creatinine, Ser: 1.57 mg/dL — ABNORMAL HIGH (ref 0.40–1.20)
GFR: 32.81 mL/min — ABNORMAL LOW (ref >60.00)
Glucose, Bld: 100 mg/dL — ABNORMAL HIGH (ref 70–99)
Potassium: 4.5 meq/L (ref 3.5–5.1)
Sodium: 135 meq/L (ref 135–145)

## 2023-06-02 LAB — TSH: TSH: 1.68 u[IU]/mL (ref 0.35–5.50)

## 2023-06-02 LAB — HEMOGLOBIN A1C: Hgb A1c MFr Bld: 6.4 % (ref 4.6–6.5)

## 2023-06-02 LAB — VITAMIN D 25 HYDROXY (VIT D DEFICIENCY, FRACTURES): VITD: 38.74 ng/mL (ref 30.00–100.00)

## 2023-06-02 NOTE — Patient Instructions (Addendum)
Please have your Shingrix (shingles) shots done at your local pharmacy, and the Tdap tetanus shot  You had the flu shot today  Please stop smoking  Please continue all other medications as before, and refills have been done if requested.  Please have the pharmacy call with any other refills you may need.  Please continue your efforts at being more active, low cholesterol diet, and weight control.  Please keep your appointments with your specialists as you may have planned  Please go to the XRAY Department in the first floor for the x-ray testing  Please go to the LAB at the blood drawing area for the tests to be done  You will be contacted by phone if any changes need to be made immediately.  Otherwise, you will receive a letter about your results with an explanation, but please check with MyChart first.  Please make an Appointment to return in 6 months, or sooner if needed

## 2023-06-02 NOTE — Progress Notes (Signed)
The test results show that your current treatment is OK, as the tests are stable, except the kidney function is quite a bit lower than before.  You did have some sign of slight kidney function at the last visit.  We should HOLD on taking the micardis for now, order an ultrasound for the kidneys, and repeat the testing in 1 week.  If not improving, we may need to have you see Nephrology (kidney doctor).  I will ask the office to call you as well.  Otherwise, Please continue the same plan.  Staff to please inform pt, I will do order for u/s, and repeat lab in 1 wk

## 2023-06-02 NOTE — Progress Notes (Unsigned)
Patient ID: Elizabeth [name], female   DOB: 1951-06-23, 72 y.o.   MRN: [id number]        Chief Complaint: follow up renal insufficiency, hld, left lung rales abnormal BS, smoker, htn, hyperglycemia       HPI:  Elizabeth [name] is a 72 y.o. female here overall doing ok,  Pt denies chest pain, increased sob or doe, wheezing, orthopnea, PND, increased LE swelling, palpitations, dizziness or syncope.  Pt denies polydipsia, polyuria, or new focal neuro s/s.    Pt denies fever, wt loss, night sweats, loss of appetite, or other constitutional symptoms   Good compliance with medications, including the new repatha. Did stop the plavix and reduced metoprolol as per Cardiology June 2024.  No further bruising, still taking asa 81 mg.  Still smoking, not ready to quit.   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, 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; groin hematoma and lower abdominal hematoma then   Degenerative joint disease    Generalized   Hyperlipidemia    Hypertension    Myocardial infarction (HCC)    x3, last MI was 6 years ago per pt/Stent placed   Osteoporosis 06/05/2017   Sickle cell trait (HCC)    Stroke (HCC) 2017   Tobacco abuse April, 2011   Continued   Past Surgical History:  Procedure Laterality Date   ANGIOPLASTY  2006   x3 stents   COLONOSCOPY  05/20/2011   CORONARY ATHERECTOMY N/A 05/22/2022   Procedure: CORONARY ATHERECTOMY;  Surgeon: Kathleene Hazel, MD;  Location: [facility];  Service: Cardiovascular;  Laterality: N/A;   CORONARY STENT INTERVENTION N/A 05/22/2022   Procedure: CORONARY STENT INTERVENTION;   Surgeon: Kathleene Hazel, MD;  Location: [facility];  Service: Cardiovascular;  Laterality: N/A;   DILATION AND CURETTAGE OF UTERUS     EP IMPLANTABLE DEVICE N/A 05/21/2016   Procedure: Loop Recorder Insertion;  Surgeon: Hillis Range, MD;  Location: [facility];  Service: Cardiovascular;  Laterality: N/A;   FOOT SURGERY  2000   LEFT HEART CATH AND CORONARY ANGIOGRAPHY N/A 05/20/2022   Procedure: LEFT HEART CATH AND CORONARY ANGIOGRAPHY;  Surgeon: Kathleene Hazel, MD;  Location: [facility];  Service: Cardiovascular;  Laterality: N/A;   LITHOTRIPSY  2008   left kidney stone and also ureteral stent placed   POLYPECTOMY     TEE WITHOUT CARDIOVERSION N/A 05/21/2016   Procedure: TRANSESOPHAGEAL ECHOCARDIOGRAM (TEE);  Surgeon: Lewayne Bunting, MD;  Location: [facility] ENDOSCOPY;  Service: Cardiovascular;  Laterality: N/A;    reports that she has been smoking cigarettes. She has never used smokeless tobacco. She reports that she does not drink alcohol and does not use drugs. family history includes Coronary artery disease in her father and mother; Heart disease in her brother; Stroke in her mother. Allergies  Allergen Reactions   Shrimp [Shellfish Allergy] Nausea And Vomiting   Adhesive [Tape] Other (See Comments)    Unknown   Soy Allergy  Nausea And Vomiting   Current Outpatient Medications on File Prior to Visit  Medication Sig Dispense Refill   albuterol (VENTOLIN HFA) 108 (90 Base) MCG/ACT inhaler Inhale 2 puffs into the lungs every 6 (six) hours as needed for wheezing or shortness of breath. 8 g 11   alendronate (FOSAMAX) 70 MG tablet Take 1 tablet (70 mg total) by mouth every 7 (seven) days. Take with a full glass of water on an empty stomach. 12 tablet 3   aspirin EC 81 MG tablet Take 1 tablet (81 mg total) by mouth daily. Swallow whole. 30 tablet 12   atorvastatin (LIPITOR) 80 MG tablet Take 1 tablet (80 mg total) by mouth daily. 90 tablet 3   clopidogrel  (PLAVIX) 75 MG tablet Take 1 tablet (75 mg total) by mouth daily. 90 tablet 2   escitalopram (LEXAPRO) 10 MG tablet Take 1 tablet (10 mg total) by mouth daily. 90 tablet 3   Evolocumab (REPATHA SURECLICK) 140 MG/ML SOAJ Inject 140 mg into the skin every 14 (fourteen) days. 6 mL 3   ezetimibe (ZETIA) 10 MG tablet TAKE 1 TABLET BY MOUTH EVERY DAY 90 tablet 3   isosorbide mononitrate (IMDUR) 30 MG 24 hr tablet TAKE 1 TABLET BY MOUTH AT BEDTIME. 90 tablet 2   metoprolol succinate (TOPROL XL) 25 MG 24 hr tablet Take 0.5 tablets (12.5 mg total) by mouth daily. 45 tablet 3   nitroGLYCERIN (NITROLINGUAL) 0.4 MG/SPRAY spray Place 1 spray under the tongue every 5 (five) minutes as needed. May repeat up to 3 doses. 12 g 1   rosuvastatin (CRESTOR) 40 MG tablet Take 1 tablet (40 mg total) by mouth daily. Please d/c atorvastatin 90 tablet 3   telmisartan (MICARDIS) 80 MG tablet 1 tab by mouth once daily 90 tablet 3   vitamin B-12 (CYANOCOBALAMIN) 1000 MCG tablet Take 1 tablet (1,000 mcg total) by mouth daily. 90 tablet 3   No current facility-administered medications on file prior to visit.        ROS:  All others reviewed and negative.  Objective        PE:  BP 116/64 (BP Location: Left Arm, Patient Position: Sitting, Cuff Size: Normal)   Pulse 78   Temp 98.2 F (36.8 C) (Oral)   Ht 5\' 3"  (1.6 m)   Wt 124 lb (56.2 kg)   SpO2 98%   BMI 21.97 kg/m                 Constitutional: Pt appears in NAD               HENT: Head: NCAT.                Right Ear: External ear normal.                 Left Ear: External ear normal.                Eyes: . Pupils are equal, round, and reactive to light. Conjunctivae and EOM are normal               Nose: without d/c or deformity               Neck: Neck supple. Gross normal ROM               Cardiovascular: Normal rate and regular rhythm.                 Pulmonary/Chest: Effort normal and breath  sounds without  wheezing. But with few LLL rales persistent                Abd:  Soft, NT, ND, + BS, no organomegaly               Neurological: Pt is alert. At baseline orientation, motor grossly intact               Skin: Skin is warm. No rashes, no other new lesions, LE edema - none               Psychiatric: Pt behavior is normal without agitation   Micro: none  Cardiac tracings I have personally interpreted today:  none  Pertinent Radiological findings (summarize): none   Lab Results  Component Value Date   WBC 8.4 06/02/2023   HGB 13.5 06/02/2023   HCT 41.2 06/02/2023   PLT 141.0 (L) 06/02/2023   GLUCOSE 100 (H) 06/02/2023   CHOL 76 06/02/2023   TRIG 38.0 06/02/2023   HDL 58.60 06/02/2023   LDLDIRECT 164.8 01/17/2010   LDLCALC 9 06/02/2023   ALT 14 06/02/2023   AST 17 06/02/2023   NA 135 06/02/2023   K 4.5 06/02/2023   CL 105 06/02/2023   CREATININE 1.57 (H) 06/02/2023   BUN 10 06/02/2023   CO2 25 06/02/2023   TSH 1.68 06/02/2023   INR 0.95 05/19/2016   HGBA1C 6.4 06/02/2023   MICROALBUR 1.6 06/02/2023   Assessment/Plan:  Chyan Lage is a 72 y.o. Black or African American [2] female with  has a past medical history of Abrasion or friction burn of foot and toe(s), with infection, Allergy, Anxiety, CAD (coronary artery disease), Degenerative joint disease, Hyperlipidemia, Hypertension, Myocardial infarction [facility]), Osteoporosis (06/05/2017), Sickle cell trait (HCC), Stroke [facility]) (2017), and Tobacco abuse (April, 2011).  Tobacco abuse Pt counsled to quit, pt not ready  Abnormal breath sounds With few left lower lung field rales - for cxr  Vitamin D deficiency Last vitamin D Lab Results  Component Value Date   VD25OH 38.74 06/02/2023   Low, to start oral replacement   B12 deficiency Lab Results  Component Value Date   VITAMINB12 646 06/02/2023   Stable, cont oral replacement - b12 1000 mcg qd   Benign essential HTN BP Readings from Last 3 Encounters:  06/02/23 116/64  02/24/23 110/72  11/28/22 116/62   Stable,  pt to continue medical treatment toprol xl 25 qd    Hyperglycemia Lab Results  Component Value Date   HGBA1C 6.4 06/02/2023   Stable, pt to continue current medical treatment  - diet, wt control   Mixed hyperlipidemia Lab Results  Component Value Date   LDLCALC 9 06/02/2023   Stable, pt to continue current statin lipitor 80 every day, zetia 10 every day, repatha 140   Followup: Return in about 6 months (around 11/30/2023).  Oliver Barre, MD 06/03/2023 8:18 PM [facility]  Primary Care - [facility]

## 2023-06-03 ENCOUNTER — Encounter: Payer: Self-pay | Admitting: Internal Medicine

## 2023-06-03 DIAGNOSIS — R0689 Other abnormalities of breathing: Secondary | ICD-10-CM | POA: Insufficient documentation

## 2023-06-03 NOTE — Assessment & Plan Note (Addendum)
Pt counsled to quit, pt not ready °

## 2023-06-03 NOTE — Assessment & Plan Note (Signed)
Lab Results  Component Value Date   HGBA1C 6.4 06/02/2023   Stable, pt to continue current medical treatment  - diet, wt control

## 2023-06-03 NOTE — Assessment & Plan Note (Signed)
Lab Results  Component Value Date   VITAMINB12 646 06/02/2023   Stable, cont oral replacement - b12 1000 mcg qd

## 2023-06-03 NOTE — Assessment & Plan Note (Signed)
Lab Results  Component Value Date   LDLCALC 9 06/02/2023   Stable, pt to continue current statin lipitor 80 every day, zetia 10 every day, repatha 140

## 2023-06-03 NOTE — Assessment & Plan Note (Signed)
With few left lower lung field rales - for cxr

## 2023-06-03 NOTE — Assessment & Plan Note (Signed)
BP Readings from Last 3 Encounters:  06/02/23 116/64  02/24/23 110/72  11/28/22 116/62   Stable, pt to continue medical treatment toprol xl 25 qd

## 2023-06-03 NOTE — Assessment & Plan Note (Signed)
Last vitamin D Lab Results  Component Value Date   VD25OH 38.74 06/02/2023   Low, to start oral replacement

## 2023-06-09 LAB — BASIC METABOLIC PANEL
BUN: 13 mg/dL (ref 6–23)
CO2: 25 meq/L (ref 19–32)
Calcium: 9.4 mg/dL (ref 8.4–10.5)
Chloride: 104 meq/L (ref 96–112)
Creatinine, Ser: 1.29 mg/dL — ABNORMAL HIGH (ref 0.40–1.20)
GFR: 41.52 mL/min — ABNORMAL LOW (ref >60.00)
Glucose, Bld: 99 mg/dL (ref 70–99)
Potassium: 4 meq/L (ref 3.5–5.1)
Sodium: 134 meq/L — ABNORMAL LOW (ref 135–145)

## 2023-06-09 NOTE — Progress Notes (Signed)
The test results show that your current treatment is OK, as the tests are stable.  Please continue the same plan.  There is no other need for change of treatment or further evaluation based on these results, at this time.  thanks

## 2023-07-15 ENCOUNTER — Other Ambulatory Visit: Payer: Self-pay | Admitting: Nurse Practitioner

## 2023-07-15 DIAGNOSIS — I10 Essential (primary) hypertension: Secondary | ICD-10-CM

## 2023-08-09 ENCOUNTER — Other Ambulatory Visit: Payer: Self-pay | Admitting: Nurse Practitioner

## 2023-08-09 DIAGNOSIS — E782 Mixed hyperlipidemia: Secondary | ICD-10-CM

## 2023-08-09 DIAGNOSIS — I2510 Atherosclerotic heart disease of native coronary artery without angina pectoris: Secondary | ICD-10-CM

## 2023-08-12 ENCOUNTER — Other Ambulatory Visit: Payer: Self-pay | Admitting: Nurse Practitioner

## 2023-08-12 DIAGNOSIS — F339 Major depressive disorder, recurrent, unspecified: Secondary | ICD-10-CM

## 2023-08-14 ENCOUNTER — Encounter: Payer: Self-pay | Admitting: Internal Medicine

## 2023-08-26 ENCOUNTER — Ambulatory Visit: Payer: Medicare Other | Admitting: Internal Medicine

## 2023-09-06 ENCOUNTER — Other Ambulatory Visit: Payer: Self-pay | Admitting: Nurse Practitioner

## 2023-09-06 DIAGNOSIS — I10 Essential (primary) hypertension: Secondary | ICD-10-CM

## 2023-09-06 DIAGNOSIS — I2510 Atherosclerotic heart disease of native coronary artery without angina pectoris: Secondary | ICD-10-CM

## 2023-10-08 ENCOUNTER — Encounter: Payer: Self-pay | Admitting: Internal Medicine

## 2023-10-08 ENCOUNTER — Ambulatory Visit: Payer: Medicare Other | Attending: Internal Medicine | Admitting: Internal Medicine

## 2023-10-08 VITALS — BP 114/76 | HR 65 | Ht 65.0 in | Wt 126.6 lb

## 2023-10-08 DIAGNOSIS — E782 Mixed hyperlipidemia: Secondary | ICD-10-CM | POA: Diagnosis not present

## 2023-10-08 DIAGNOSIS — I2510 Atherosclerotic heart disease of native coronary artery without angina pectoris: Secondary | ICD-10-CM | POA: Diagnosis not present

## 2023-10-08 DIAGNOSIS — Z95818 Presence of other cardiac implants and grafts: Secondary | ICD-10-CM | POA: Diagnosis not present

## 2023-10-08 MED ORDER — ROSUVASTATIN CALCIUM 40 MG PO TABS: 40.0000 mg | ORAL_TABLET | Freq: Every day | ORAL | 3 refills | Status: AC

## 2023-10-08 NOTE — Patient Instructions (Signed)
 Medication Instructions:   STOP clopidogrel  (plavix )  Rosuvastatin  has been refilled  *If you need a refill on your cardiac medications before your next appointment, please call your pharmacy*   Follow-Up: At [facility], you and your health needs are our priority.  As part of our continuing mission to provide you with exceptional heart care, we have created designated Provider Care Teams.  These Care Teams include your primary Cardiologist (physician) and Advanced Practice Providers (APPs -  Physician Assistants and Nurse Practitioners) who all work together to provide you with the care you need, when you need it.  We recommend signing up for the patient portal called "MyChart".  Sign up information is provided on this After Visit Summary.  MyChart is used to connect with patients for Virtual Visits (Telemedicine).  Patients are able to view lab/test results, encounter notes, upcoming appointments, etc.  Non-urgent messages can be sent to your provider as well.   To learn more about what you can do with MyChart, go to [email or web address].    Your next appointment:   6 months with NP or PA ** CALL in April for a July appointment  You have been referred to a cardiac electrophysiologist about your loop recorder  Other Instructions   1st Floor: - Lobby - Registration  - Pharmacy  - Lab - Cafe  2nd Floor: - PV Lab - Diagnostic Testing (echo, CT, nuclear med)  3rd Floor: - Vacant  4th Floor: - TCTS (cardiothoracic surgery) - AFib Clinic - Structural Heart Clinic - Vascular Surgery  - Vascular Ultrasound  5th Floor: - HeartCare Cardiology (general and EP) - Clinical Pharmacy for coumadin, hypertension, lipid, weight-loss medications, and med management appointments    Valet parking services will be available as well.

## 2023-10-08 NOTE — Progress Notes (Signed)
 Cardiology Office Note:    Date:  10/08/2023   ID:  Elizabeth [name], DOB 05-Aug-1951, MRN [id number]  PCP:  Roslyn Coombe, MD   [facility] Providers Cardiologist:  Bridgette Campus, MD     Referring MD: Roslyn Coombe, MD   No chief complaint on file. CAD  History of Present Illness:    Elizabeth [name] is a 73 y.o. female with a hx  CAD (s/p PTCA to RCA and LCx in 2001, STEMI in 2006 with DES to RCA and 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), HTN, HLD, tobacco use and prior CVA (still has ILR in place, no afib) who is being seen 05/19/2022. She was having intermittent CP and shoulder pain for 1 week. Echo showed normal LV function. MR more moderate. Had LHC that showed  highly calcified distal right coronary artery lesion, high-grade in-stent restenosis of the previously placed PDA stent, moderate in-stent restenosis of the mid right coronary artery stent, possible high-grade lesion of the RPLV Linford Quintela, high-grade diagonal disease and high-grade circumflex disease as well as OM1 disease. She was started on ticagrelor  and scheduled for PCI. She underwent an uncomplicated PCI atherectomy of distal right coronary artery.  Circ dx was not amenable to PCI, she had no good surgical targets.  Was planned to continue ticag however for cost reasons switched to plavix .   Interim Hx 09/10/2022 No issues with her medications. BP is well controlled. Pending patient assistance for her repatha .  Otherwise, she walks for exercise. She denies chest pain. She can get tired.  No SOB. No orthopnea or PND. No LE edema.  Interim 02/24/2023 She walks and feels good. She notes she has some low energy. She is planting flowers. She denies chest pain. Started repatha  , for free.   Interim 10/08/2023 She is having some ankle swelling. Gets worse with standing improved with lifting. No CP or SOB.   Current Medications: No outpatient medications have been marked as taking for the 10/08/23 encounter (Appointment) with Bridgette Campus,  MD.     Allergies:   Shrimp [shellfish allergy], Adhesive [tape], and Soy allergy (do not select)   Social History   Socioeconomic History   Marital status: Married    Spouse name: Not on file   Number of children: Not on file   Years of education: Not on file   Highest education level: Not on file  Occupational History   Not on file  Tobacco Use   Smoking status: Every Day    Current packs/day: 0.50    Types: Cigarettes   Smokeless tobacco: Never   Tobacco comments:    Ongoing since 04/2005  Vaping Use   Vaping status: Never Used  Substance and Sexual Activity   Alcohol use: No   Drug use: No   Sexual activity: Not on file  Other Topics Concern   Not on file  Social History Narrative   Not on file   Social Drivers of Health   Financial Resource Strain: Low Risk  (12/18/2022)   Overall Financial Resource Strain (CARDIA)    Difficulty of Paying Living Expenses: Not hard at all  Food Insecurity: No Food Insecurity (12/18/2022)   Hunger Vital Sign    Worried About Running Out of Food in the Last Year: Never true    Ran Out of Food in the Last Year: Never true  Transportation Needs: No Transportation Needs (12/18/2022)   PRAPARE - Transportation    Lack of Transportation (Medical): No    Lack  of Transportation (Non-Medical): No  Physical Activity: Inactive (12/18/2022)   Exercise Vital Sign    Days of Exercise per Week: 0 days    Minutes of Exercise per Session: 0 min  Stress: No Stress Concern Present (12/18/2022)   [employer] of Occupational Health - Occupational Stress Questionnaire    Feeling of Stress : Not at all  Social Connections: Socially Integrated (12/18/2022)   Social Connection and Isolation Panel [NHANES]    Frequency of Communication with Friends and Family: More than three times a week    Frequency of Social Gatherings with Friends and Family: More than three times a week    Attends Religious Services: More than 4 times per year    Active Member  of [employer] or Organizations: Yes    Attends Engineer, structural: More than 4 times per year    Marital Status: Married     Family History: The patient's family history includes Coronary artery disease in her father and mother; Heart disease in her brother; Stroke in her mother. There is no history of Colon cancer, Colon polyps, Esophageal cancer, Stomach cancer, or Rectal cancer.  ROS:   Please see the history of present illness.     All other systems reviewed and are negative.  EKGs/Labs/Other Studies Reviewed:    The following studies were reviewed today:  02/24/2023-Sinus bradycardia 59 bpm  Recent Labs: 06/02/2023: ALT 14; Hemoglobin 13.5; Platelets 141.0; TSH 1.68 06/09/2023: BUN 13; Creatinine, Ser 1.29; Potassium 4.0; Sodium 134   Recent Lipid Panel    Component Value Date/Time   CHOL 76 06/02/2023 1114   TRIG 38.0 06/02/2023 1114   HDL 58.60 06/02/2023 1114   CHOLHDL 1 06/02/2023 1114   VLDL 7.6 06/02/2023 1114   LDLCALC 9 06/02/2023 1114   LDLDIRECT 164.8 01/17/2010 0911     Risk Assessment/Calculations:         Physical Exam:    VS:   There were no vitals filed for this visit.    Wt Readings from Last 3 Encounters:  06/02/23 124 lb (56.2 kg)  02/24/23 127 lb 6.4 oz (57.8 kg)  12/18/22 129 lb (58.5 kg)     GEN:  Well nourished, well developed in no acute distress HEENT: Normal NECK: No JVD; No carotid bruits LYMPHATICS: No lymphadenopathy CARDIAC: RRR, no murmurs, rubs, gallops RESPIRATORY:  Clear to auscultation without rales, wheezing or rhonchi  ABDOMEN: Soft, non-tender, non-distended MUSCULOSKELETAL:  No edema; No deformity  SKIN: Warm and dry NEUROLOGIC:  Alert and oriented x 3 PSYCHIATRIC:  Normal affect   ASSESSMENT:    Coronary dx: distal RCA s/p PTCA/orbital atherectomy/DES 8/30/323. normal EF. continue DAPT (ASA/plavix )  until 05/23/2023 per interventional. Continue imdur  30 mg daily.  Continue beta-blocker  HLD: LDL 100, Lp(a)  184, on atorva 80 mg daily and zetia  10 mg daily. Being seen in lipid clinic with LDL goal < 55 mg dL. Continue repatha   Prior CVA: on dapt post PCI. Continue Repatha . No significant deficits.  She has a loop recorder in place since 2017.  Mild-Moderate MR: continue surveillance. Last echo 04/2022. asymptomatic  HTN: well controlled. Metop XL 12.5 mg daily, continue telmisartan  80 mg PLAN:    In order of problems listed above:   EP consult for loop recorder removal Stopped plavix  Pharmacy assistance with repatha  cost Follow up in 6 months with an APP      Medication Adjustments/Labs and Tests Ordered: Current medicines are reviewed at length with the patient today.  Concerns  regarding medicines are outlined above.  No orders of the defined types were placed in this encounter.  No orders of the defined types were placed in this encounter.   There are no Patient Instructions on file for this visit.   Signed, Bridgette Campus, MD  10/08/2023 11:39 AM    [facility]

## 2023-11-27 ENCOUNTER — Telehealth: Payer: Self-pay

## 2023-11-27 NOTE — Telephone Encounter (Signed)
 Attempted to contact patient, possible ILR extraction on 12/01/23 and patient has an allergy to tape. Wanting to confirm steri-strips and tegaderm have been tolerated well in the past

## 2023-12-01 ENCOUNTER — Encounter: Payer: Self-pay | Admitting: Cardiovascular Disease

## 2023-12-01 ENCOUNTER — Ambulatory Visit: Payer: Medicare Other | Admitting: Internal Medicine

## 2023-12-01 ENCOUNTER — Ambulatory Visit: Payer: Medicare Other | Attending: Cardiovascular Disease | Admitting: Cardiovascular Disease

## 2023-12-01 VITALS — BP 124/84 | HR 66 | Ht 65.0 in | Wt 122.8 lb

## 2023-12-01 DIAGNOSIS — I6340 Cerebral infarction due to embolism of unspecified cerebral artery: Secondary | ICD-10-CM | POA: Diagnosis not present

## 2023-12-01 DIAGNOSIS — Z95818 Presence of other cardiac implants and grafts: Secondary | ICD-10-CM | POA: Diagnosis not present

## 2023-12-01 NOTE — Patient Instructions (Signed)
 Medication Instructions:  Your physician recommends that you continue on your current medications as directed. Please refer to the Current Medication list given to you today. *If you need a refill on your cardiac medications before your next appointment, please call your pharmacy*   Testing/Procedures: Explant of Loop Recorder - someone will contact you to schedule this appointment    Follow-Up: At [facility], you and your health needs are our priority.  As part of our continuing mission to provide you with exceptional heart care, we have created designated Provider Care Teams.  These Care Teams include your primary Cardiologist (physician) and Advanced Practice Providers (APPs -  Physician Assistants and Nurse Practitioners) who all work together to provide you with the care you need, when you need it.  We recommend signing up for the patient portal called "MyChart".  Sign up information is provided on this After Visit Summary.  MyChart is used to connect with patients for Virtual Visits (Telemedicine).  Patients are able to view lab/test results, encounter notes, upcoming appointments, etc.  Non-urgent messages can be sent to your provider as well.   To learn more about what you can do with MyChart, go to [email or web address].    Your next appointment:   We will contact you to schedule this appointment  Provider:   York Pellant, MD

## 2023-12-01 NOTE — Progress Notes (Signed)
  Electrophysiology Office Note:    Date:  12/01/2023   ID:  Elizabeth [name], DOB 1950/10/09, MRN [id number]  PCP:  Elizabeth Levins, MD   [name] Providers Cardiologist:  Elizabeth Fus, MD Electrophysiologist:  Maurice Small, MD     Referring MD: Elizabeth Fus, MD   History of Present Illness:    Elizabeth [name] is a 73 y.o. female with a medical history significant for prior stroke with loop recorder in place CAD, hypertension, hyperlipidemia, referred for loop recorder management.     She has a loop recorder placed by Dr. Johney Frame after stroke.  No arrhythmia was detected.  The last transmission was in May 2021.  She is referred today to discuss having the loop recorder removed.  She denies having any issues with it --no tenderness, discomfort, irritation.     Today, has no complaints and is at baseline.  EKGs/Labs/Other Studies Reviewed Today:       EKG:   EKG Interpretation Date/Time:  Monday December 01 2023 13:11:31 EDT Ventricular Rate:  66 PR Interval:  126 QRS Duration:  76 QT Interval:  430 QTC Calculation: 450 R Axis:   93  Text Interpretation: Sinus rhythm with Premature atrial complexes in a pattern of bigeminy Possible Lateral infarct , age undetermined Possible Inferior infarct , age undetermined When compared with ECG of 08-Oct-2023 11:57, Premature atrial complexes are now Present Confirmed by Elizabeth [name] [phone]) on 12/01/2023 1:16:13 PM     Physical Exam:    VS:  BP 124/84 (BP Location: Left Arm, Patient Position: Sitting, Cuff Size: Normal)   Pulse 66   Ht 5\' 5"  (1.651 m)   Wt 122 lb 12.8 oz (55.7 kg)   SpO2 98%   BMI 20.43 kg/m     Wt Readings from Last 3 Encounters:  12/01/23 122 lb 12.8 oz (55.7 kg)  10/08/23 126 lb 9.6 oz (57.4 kg)  06/02/23 124 lb (56.2 kg)     GEN: Well nourished, well developed in no acute distress CARDIAC: RRR, no murmurs, rubs, gallops Loop recorder is in the standard location, fairly shallow and  accessible. RESPIRATORY:  Normal work of breathing MUSCULOSKELETAL: No edema    ASSESSMENT & PLAN:     History of stroke; loop recorder in place, at EOL She was referred to discuss having the loop recorder explanted I described the procedure to her and the risks including minor bleeding. We discussed the risks and benefits of the explant procedure.  I explained that explant is recommended to prevent migration of the loop recorder and development of future issues.  Explant is not absolutely necessary. After discussion, she has decided she would like to have the device removed.  We will schedule the procedure.      Signed, Maurice Small, MD  12/01/2023 1:27 PM    [place] [name]

## 2023-12-08 ENCOUNTER — Ambulatory Visit (INDEPENDENT_AMBULATORY_CARE_PROVIDER_SITE_OTHER): Admitting: Internal Medicine

## 2023-12-08 ENCOUNTER — Encounter: Payer: Self-pay | Admitting: Internal Medicine

## 2023-12-08 VITALS — BP 120/70 | HR 75 | Temp 98.4°F | Ht 65.0 in | Wt 123.0 lb

## 2023-12-08 DIAGNOSIS — Z860101 Personal history of adenomatous and serrated colon polyps: Secondary | ICD-10-CM | POA: Diagnosis not present

## 2023-12-08 DIAGNOSIS — Z720 Tobacco use: Secondary | ICD-10-CM | POA: Diagnosis not present

## 2023-12-08 DIAGNOSIS — E782 Mixed hyperlipidemia: Secondary | ICD-10-CM | POA: Diagnosis not present

## 2023-12-08 DIAGNOSIS — E559 Vitamin D deficiency, unspecified: Secondary | ICD-10-CM | POA: Diagnosis not present

## 2023-12-08 DIAGNOSIS — Z1231 Encounter for screening mammogram for malignant neoplasm of breast: Secondary | ICD-10-CM

## 2023-12-08 DIAGNOSIS — I10 Essential (primary) hypertension: Secondary | ICD-10-CM

## 2023-12-08 DIAGNOSIS — E538 Deficiency of other specified B group vitamins: Secondary | ICD-10-CM | POA: Diagnosis not present

## 2023-12-08 DIAGNOSIS — R739 Hyperglycemia, unspecified: Secondary | ICD-10-CM

## 2023-12-08 LAB — MICROALBUMIN / CREATININE URINE RATIO
Creatinine,U: 77.8 mg/dL
Microalb Creat Ratio: 14 mg/g (ref 0.0–30.0)
Microalb, Ur: 1.1 mg/dL (ref 0.0–1.9)

## 2023-12-08 LAB — BASIC METABOLIC PANEL
BUN: 10 mg/dL (ref 6–23)
CO2: 27 meq/L (ref 19–32)
Calcium: 9.4 mg/dL (ref 8.4–10.5)
Chloride: 105 meq/L (ref 96–112)
Creatinine, Ser: 0.95 mg/dL (ref 0.40–1.20)
GFR: 59.73 mL/min — ABNORMAL LOW (ref >60.00)
Glucose, Bld: 92 mg/dL (ref 70–99)
Potassium: 4.1 meq/L (ref 3.5–5.1)
Sodium: 137 meq/L (ref 135–145)

## 2023-12-08 LAB — URINALYSIS, ROUTINE W REFLEX MICROSCOPIC
Bilirubin Urine: NEGATIVE
Hgb urine dipstick: NEGATIVE
Ketones, ur: NEGATIVE
Leukocytes,Ua: NEGATIVE
Nitrite: NEGATIVE
Specific Gravity, Urine: 1.01 (ref 1.000–1.030)
Total Protein, Urine: NEGATIVE
Urine Glucose: NEGATIVE
Urobilinogen, UA: 1 (ref 0.0–1.0)
pH: 6 (ref 5.0–8.0)

## 2023-12-08 LAB — CBC WITH DIFFERENTIAL/PLATELET
Basophils Absolute: 0 10*3/uL (ref 0.0–0.1)
Basophils Relative: 0.7 % (ref 0.0–3.0)
Eosinophils Absolute: 0.1 10*3/uL (ref 0.0–0.7)
Eosinophils Relative: 1.5 % (ref 0.0–5.0)
HCT: 37.6 % (ref 36.0–46.0)
Hemoglobin: 12.5 g/dL (ref 12.0–15.0)
Lymphocytes Relative: 39.8 % (ref 12.0–46.0)
Lymphs Abs: 2.6 10*3/uL (ref 0.7–4.0)
MCHC: 33.3 g/dL (ref 30.0–36.0)
MCV: 90.5 fl (ref 78.0–100.0)
Monocytes Absolute: 0.7 10*3/uL (ref 0.1–1.0)
Monocytes Relative: 10.2 % (ref 3.0–12.0)
Neutro Abs: 3.2 10*3/uL (ref 1.4–7.7)
Neutrophils Relative %: 47.8 % (ref 43.0–77.0)
Platelets: 136 10*3/uL — ABNORMAL LOW (ref 150.0–400.0)
RBC: 4.16 Mil/uL (ref 3.87–5.11)
RDW: 16.5 % — ABNORMAL HIGH (ref 11.5–15.5)
WBC: 6.6 10*3/uL (ref 4.0–10.5)

## 2023-12-08 LAB — HEMOGLOBIN A1C: Hgb A1c MFr Bld: 6.2 % (ref 4.6–6.5)

## 2023-12-08 LAB — HEPATIC FUNCTION PANEL
ALT: 8 U/L (ref 0–35)
AST: 14 U/L (ref 0–37)
Albumin: 3.9 g/dL (ref 3.5–5.2)
Alkaline Phosphatase: 68 U/L (ref 39–117)
Bilirubin, Direct: 0.1 mg/dL (ref 0.0–0.3)
Total Bilirubin: 0.5 mg/dL (ref 0.2–1.2)
Total Protein: 6.4 g/dL (ref 6.0–8.3)

## 2023-12-08 LAB — LIPID PANEL
Cholesterol: 135 mg/dL (ref 0–200)
HDL: 57.3 mg/dL (ref >39.00)
LDL Cholesterol: 61 mg/dL (ref 0–99)
NonHDL: 77.32
Total CHOL/HDL Ratio: 2
Triglycerides: 83 mg/dL (ref 0.0–149.0)
VLDL: 16.6 mg/dL (ref 0.0–40.0)

## 2023-12-08 LAB — VITAMIN D 25 HYDROXY (VIT D DEFICIENCY, FRACTURES): VITD: 17.31 ng/mL — ABNORMAL LOW (ref 30.00–100.00)

## 2023-12-08 LAB — TSH: TSH: 2.35 u[IU]/mL (ref 0.35–5.50)

## 2023-12-08 LAB — VITAMIN B12: Vitamin B-12: 341 pg/mL (ref 211–911)

## 2023-12-08 NOTE — Patient Instructions (Addendum)
 You will be contacted regarding the referral for: colonoscopy, and mammogram  Please have your Shingrix (shingles) shots done at your local pharmacy., and the Tdap tetanus shot as well  Please continue all other medications as before, and refills have been done if requested.  Please have the pharmacy call with any other refills you may need.  Please continue your efforts at being more active, low cholesterol diet, and weight control.  You are otherwise up to date with prevention measures today.  Please keep your appointments with your specialists as you may have planned  Please go to the LAB at the blood drawing area for the tests to be done  You will be contacted by phone if any changes need to be made immediately.  Otherwise, you will receive a letter about your results with an explanation, but please check with MyChart first.  Please make an Appointment to return in 6 months, or sooner if needed

## 2023-12-08 NOTE — Progress Notes (Unsigned)
 Patient ID: Elizabeth [name], female   DOB: [birth year], 73 y.o.   MRN: [id number]        Chief Complaint: follow up HTN, HLD and hyperglycemia , smoker, low vit d and b12,        HPI:  Elizabeth [name] is a 73 y.o. female here overall doing ok, Pt denies chest pain, increased sob or doe, wheezing, orthopnea, PND, increased LE swelling, palpitations, dizziness or syncope.   Pt denies polydipsia, polyuria, or new focal neuro s/s.    Pt denies fever, wt loss, night sweats, loss of appetite, or other constitutional symptoms  Plavix was stopped and asa decreased to 81 mg every day per neurology recently.  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; groin hematoma and lower abdominal hematoma then   Degenerative joint disease    Generalized   Hyperlipidemia    Hypertension    Myocardial infarction (HCC)    x3, last MI was 6 years ago per pt/Stent placed   Osteoporosis 06/05/2017   Sickle cell trait (HCC)    Stroke (HCC) 2017   Tobacco abuse April, 2011   Continued   Past Surgical History:  Procedure Laterality Date   ANGIOPLASTY  2006   x3 stents   COLONOSCOPY  05/20/2011   CORONARY ATHERECTOMY N/A 05/22/2022   Procedure: CORONARY ATHERECTOMY;  Surgeon: Kathleene Hazel, MD;  Location: [facility] INVASIVE CV LAB;  Service: Cardiovascular;  Laterality: N/A;   CORONARY STENT INTERVENTION N/A 05/22/2022   Procedure: CORONARY STENT INTERVENTION;  Surgeon: Kathleene Hazel, MD;  Location: [facility] INVASIVE CV LAB;  Service: Cardiovascular;  Laterality: N/A;    DILATION AND CURETTAGE OF UTERUS     EP IMPLANTABLE DEVICE N/A 05/21/2016   Procedure: Loop Recorder Insertion;  Surgeon: Hillis Range, MD;  Location: [facility] INVASIVE CV LAB;  Service: Cardiovascular;  Laterality: N/A;   FOOT SURGERY  2000   LEFT HEART CATH AND CORONARY ANGIOGRAPHY N/A 05/20/2022   Procedure: LEFT HEART CATH AND CORONARY ANGIOGRAPHY;  Surgeon: Kathleene Hazel, MD;  Location: [facility] INVASIVE CV LAB;  Service: Cardiovascular;  Laterality: N/A;   LITHOTRIPSY  2008   left kidney stone and also ureteral stent placed   POLYPECTOMY     TEE WITHOUT CARDIOVERSION N/A 05/21/2016   Procedure: TRANSESOPHAGEAL ECHOCARDIOGRAM (TEE);  Surgeon: Lewayne Bunting, MD;  Location: [facility] ENDOSCOPY;  Service: Cardiovascular;  Laterality: N/A;    reports that she has been smoking cigarettes. She has never used smokeless tobacco. She reports that she does not drink alcohol and does not use drugs. family history includes Coronary artery disease in her father and mother; Heart disease in her brother; Stroke in her mother. Allergies  Allergen Reactions   Shrimp [Shellfish Allergy] Nausea And Vomiting   Adhesive [Tape] Other (See Comments)    Unknown   Soy Allergy (Obsolete) Nausea And Vomiting   Current Outpatient Medications on  File Prior to Visit  Medication Sig Dispense Refill   albuterol (VENTOLIN HFA) 108 (90 Base) MCG/ACT inhaler Inhale 2 puffs into the lungs every 6 (six) hours as needed for wheezing or shortness of breath. 8 g 11   alendronate (FOSAMAX) 70 MG tablet Take 1 tablet (70 mg total) by mouth every 7 (seven) days. Take with a full glass of water on an empty stomach. 12 tablet 3   aspirin EC 81 MG tablet Take 1 tablet (81 mg total) by mouth daily. Swallow whole. 30 tablet 12   escitalopram (LEXAPRO) 10 MG tablet TAKE 1 TABLET BY MOUTH EVERY DAY 90 tablet 2   Evolocumab (REPATHA SURECLICK) 140 MG/ML SOAJ Inject 140 mg into the skin every 14 (fourteen) days. 6 mL 3   ezetimibe (ZETIA)  10 MG tablet TAKE 1 TABLET BY MOUTH EVERY DAY 90 tablet 3   isosorbide mononitrate (IMDUR) 30 MG 24 hr tablet TAKE 1 TABLET BY MOUTH AT BEDTIME. 90 tablet 2   metoprolol succinate (TOPROL XL) 25 MG 24 hr tablet Take 0.5 tablets (12.5 mg total) by mouth daily. 45 tablet 3   nitroGLYCERIN (NITROLINGUAL) 0.4 MG/SPRAY spray Place 1 spray under the tongue every 5 (five) minutes as needed. May repeat up to 3 doses. 12 g 1   rosuvastatin (CRESTOR) 40 MG tablet Take 1 tablet (40 mg total) by mouth daily. 90 tablet 3   telmisartan (MICARDIS) 80 MG tablet TAKE 1 TABLET BY MOUTH EVERY DAY 90 tablet 2   vitamin B-12 (CYANOCOBALAMIN) 1000 MCG tablet Take 1 tablet (1,000 mcg total) by mouth daily. 90 tablet 3   No current facility-administered medications on file prior to visit.        ROS:  All others reviewed and negative.  Objective        PE:  BP 120/70 (BP Location: Right Arm, Patient Position: Sitting, Cuff Size: Normal)   Pulse 75   Temp 98.4 F (36.9 C) (Oral)   Ht 5\' 5"  (1.651 m)   Wt 123 lb (55.8 kg)   SpO2 99%   BMI 20.47 kg/m                 Constitutional: Pt appears in NAD               HENT: Head: NCAT.                Right Ear: External ear normal.                 Left Ear: External ear normal.                Eyes: . Pupils are equal, round, and reactive to light. Conjunctivae and EOM are normal               Nose: without d/c or deformity               Neck: Neck supple. Gross normal ROM               Cardiovascular: Normal rate and regular rhythm.                 Pulmonary/Chest: Effort normal and breath sounds without rales or wheezing.                Abd:  Soft, NT, ND, + BS, no organomegaly               Neurological: Pt is alert. At baseline orientation  Skin: Skin is warm. No rashes, no other new lesions, LE edema - none               Psychiatric: Pt behavior is normal without agitation   Micro: none  Cardiac tracings I have personally interpreted today:   none  Pertinent Radiological findings (summarize): none   Lab Results  Component Value Date   WBC 6.6 12/08/2023   HGB 12.5 12/08/2023   HCT 37.6 12/08/2023   PLT 136.0 (L) 12/08/2023   GLUCOSE 92 12/08/2023   CHOL 135 12/08/2023   TRIG 83.0 12/08/2023   HDL 57.30 12/08/2023   LDLDIRECT 164.8 01/17/2010   LDLCALC 61 12/08/2023   ALT 8 12/08/2023   AST 14 12/08/2023   NA 137 12/08/2023   K 4.1 12/08/2023   CL 105 12/08/2023   CREATININE 0.95 12/08/2023   BUN 10 12/08/2023   CO2 27 12/08/2023   TSH 2.35 12/08/2023   INR 0.95 05/19/2016   HGBA1C 6.2 12/08/2023   MICROALBUR 1.1 12/08/2023   Assessment/Plan:  Elizabeth [name] is a 73 y.o. Black or African American [2] female with  has a past medical history of Abrasion or friction burn of foot and toe(s), with infection, Allergy, Anxiety, CAD (coronary artery disease), Degenerative joint disease, Hyperlipidemia, Hypertension, Myocardial infarction [facility]), Osteoporosis (06/05/2017), Sickle cell trait (HCC), Stroke [facility]) (2017), and Tobacco abuse (April, 2011).  B12 deficiency Lab Results  Component Value Date   VITAMINB12 341 12/08/2023   Stable, cont oral replacement - b12 1000 mcg qd   Benign essential HTN BP Readings from Last 3 Encounters:  12/08/23 120/70  12/01/23 124/84  10/08/23 114/76   Stable, pt to continue medical treatment toprol xl 25 - 1/2 every day, micardis 80 qd   Hyperglycemia Lab Results  Component Value Date   HGBA1C 6.2 12/08/2023   Stable, pt to continue current medical treatment  - diet, wt control   Mixed hyperlipidemia Lab Results  Component Value Date   LDLCALC 61 12/08/2023   Stable, pt to continue current statin crestor 40 every day, zetia 10 qd   Tobacco abuse Pt counsled to quit, pt not ready  Vitamin D deficiency Last vitamin D Lab Results  Component Value Date   VD25OH 17.31 (L) 12/08/2023   Low, to start oral replacement  Followup: Return in about 6 months (around  06/09/2024).  Oliver Barre, MD 12/09/2023 8:45 PM [facility]

## 2023-12-09 ENCOUNTER — Encounter: Payer: Self-pay | Admitting: Internal Medicine

## 2023-12-09 NOTE — Assessment & Plan Note (Signed)
 BP Readings from Last 3 Encounters:  12/08/23 120/70  12/01/23 124/84  10/08/23 114/76   Stable, pt to continue medical treatment toprol xl 25 - 1/2 every day, micardis 80 qd

## 2023-12-09 NOTE — Assessment & Plan Note (Signed)
 Pt counsled to quit, pt not ready

## 2023-12-09 NOTE — Assessment & Plan Note (Signed)
 Lab Results  Component Value Date   HGBA1C 6.2 12/08/2023   Stable, pt to continue current medical treatment  - diet, wt control

## 2023-12-09 NOTE — Assessment & Plan Note (Signed)
 Last vitamin D Lab Results  Component Value Date   VD25OH 17.31 (L) 12/08/2023   Low, to start oral replacement

## 2023-12-09 NOTE — Assessment & Plan Note (Signed)
 Lab Results  Component Value Date   LDLCALC 61 12/08/2023   Stable, pt to continue current statin crestor 40 every day, zetia 10 qd

## 2023-12-09 NOTE — Assessment & Plan Note (Signed)
 Lab Results  Component Value Date   VITAMINB12 341 12/08/2023   Stable, cont oral replacement - b12 1000 mcg qd

## 2024-01-01 ENCOUNTER — Ambulatory Visit

## 2024-01-01 VITALS — Ht 63.0 in | Wt 123.0 lb

## 2024-01-01 DIAGNOSIS — Z0000 Encounter for general adult medical examination without abnormal findings: Secondary | ICD-10-CM

## 2024-01-01 DIAGNOSIS — Z0100 Encounter for examination of eyes and vision without abnormal findings: Secondary | ICD-10-CM

## 2024-01-01 NOTE — Progress Notes (Signed)
 Subjective:   Elizabeth [name] is a 73 y.o. who presents for a Medicare Wellness preventive visit.  Visit Complete: Virtual I connected with  Elizabeth [name] on 01/01/24 by a audio enabled telemedicine application and verified that I am speaking with the correct person using two identifiers.  Patient Location: Home  Provider Location: Office/Clinic  I discussed the limitations of evaluation and management by telemedicine. The patient expressed understanding and agreed to proceed.  Vital Signs: Because this visit was a virtual/telehealth visit, some criteria may be missing or patient reported. Any vitals not documented were not able to be obtained and vitals that have been documented are patient reported.  VideoDeclined- This patient declined Librarian, academic. Therefore the visit was completed with audio only.  Persons Participating in Visit: Patient.  AWV Questionnaire: No: Patient Medicare AWV questionnaire was not completed prior to this visit.  Cardiac Risk Factors include: advanced age (>29men, >65 women);dyslipidemia;hypertension;smoking/ tobacco exposure (Current smoker)     Objective:    Today's Vitals   01/01/24 1130  Weight: 123 lb (55.8 kg)  Height: 5\' 3"  (1.6 m)   Body mass index is 21.79 kg/m.     01/01/2024   11:27 AM 12/18/2022    2:06 PM 05/19/2022   10:00 AM 07/11/2016   10:32 AM 06/13/2016   11:09 AM 06/11/2016   10:21 AM 05/30/2016    8:05 AM  Advanced Directives  Does Patient Have a Medical Advance Directive? Yes No No  No No No  Type of Estate agent of [place];Living will        Does patient want to make changes to medical advance directive? No - Patient declined        Copy of Healthcare Power of Attorney in Chart? Yes - validated most recent copy scanned in chart (See row information)        Would patient like information on creating a medical advance directive?  No - Patient declined No - Patient  declined Yes - Educational materials given No - patient declined information No - patient declined information No - patient declined information    Current Medications (verified) Outpatient Encounter Medications as of 01/01/2024  Medication Sig   albuterol (VENTOLIN HFA) 108 (90 Base) MCG/ACT inhaler Inhale 2 puffs into the lungs every 6 (six) hours as needed for wheezing or shortness of breath.   alendronate (FOSAMAX) 70 MG tablet Take 1 tablet (70 mg total) by mouth every 7 (seven) days. Take with a full glass of water on an empty stomach.   aspirin EC 81 MG tablet Take 1 tablet (81 mg total) by mouth daily. Swallow whole.   escitalopram (LEXAPRO) 10 MG tablet TAKE 1 TABLET BY MOUTH EVERY DAY   ezetimibe (ZETIA) 10 MG tablet TAKE 1 TABLET BY MOUTH EVERY DAY   isosorbide mononitrate (IMDUR) 30 MG 24 hr tablet TAKE 1 TABLET BY MOUTH AT BEDTIME.   metoprolol succinate (TOPROL XL) 25 MG 24 hr tablet Take 0.5 tablets (12.5 mg total) by mouth daily.   nitroGLYCERIN (NITROLINGUAL) 0.4 MG/SPRAY spray Place 1 spray under the tongue every 5 (five) minutes as needed. May repeat up to 3 doses.   rosuvastatin (CRESTOR) 40 MG tablet Take 1 tablet (40 mg total) by mouth daily.   telmisartan (MICARDIS) 80 MG tablet TAKE 1 TABLET BY MOUTH EVERY DAY   vitamin B-12 (CYANOCOBALAMIN) 1000 MCG tablet Take 1 tablet (1,000 mcg total) by mouth daily.   Evolocumab (REPATHA SURECLICK) 140 MG/ML SOAJ  Inject 140 mg into the skin every 14 (fourteen) days. (Patient not taking: Reported on 01/01/2024)   No facility-administered encounter medications on file as of 01/01/2024.    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; groin hematoma and lower  abdominal hematoma then   Degenerative joint disease    Generalized   Hyperlipidemia    Hypertension    Myocardial infarction (HCC)    x3, last MI was 6 years ago per pt/Stent placed   Osteoporosis 06/05/2017   Sickle cell trait (HCC)    Stroke (HCC) 2017   Tobacco abuse April, 2011   Continued   Past Surgical History:  Procedure Laterality Date   ANGIOPLASTY  2006   x3 stents   COLONOSCOPY  05/20/2011   CORONARY ATHERECTOMY N/A 05/22/2022   Procedure: CORONARY ATHERECTOMY;  Surgeon: Kathleene Hazel, MD;  Location: [facility];  Service: Cardiovascular;  Laterality: N/A;   CORONARY STENT INTERVENTION N/A 05/22/2022   Procedure: CORONARY STENT INTERVENTION;  Surgeon: Kathleene Hazel, MD;  Location: [facility];  Service: Cardiovascular;  Laterality: N/A;   DILATION AND CURETTAGE OF UTERUS     EP IMPLANTABLE DEVICE N/A 05/21/2016   Procedure: Loop Recorder Insertion;  Surgeon: Hillis Range, MD;  Location: [facility];  Service: Cardiovascular;  Laterality: N/A;   FOOT SURGERY  2000   LEFT HEART CATH AND CORONARY ANGIOGRAPHY N/A 05/20/2022   Procedure: LEFT HEART CATH AND CORONARY ANGIOGRAPHY;  Surgeon: Kathleene Hazel, MD;  Location: [facility];  Service: Cardiovascular;  Laterality: N/A;   LITHOTRIPSY  2008   left kidney stone and also ureteral stent placed   POLYPECTOMY     TEE WITHOUT CARDIOVERSION N/A 05/21/2016   Procedure: TRANSESOPHAGEAL ECHOCARDIOGRAM (TEE);  Surgeon: Lewayne Bunting, MD;  Location: [facility] ENDOSCOPY;  Service: Cardiovascular;  Laterality: N/A;   Family History  Problem Relation Age of Onset   Coronary artery disease Mother    Stroke Mother    Coronary artery disease Father    Heart disease Brother    Colon cancer Neg Hx    Colon polyps Neg Hx    Esophageal cancer Neg Hx    Stomach cancer Neg Hx    Rectal cancer Neg Hx    Social History   Socioeconomic History   Marital status: Married    Spouse name:  Not on file   Number of children: Not on file   Years of education: Not on file   Highest education level: Not on file  Occupational History   Not on file  Tobacco Use   Smoking status: Every Day    Current packs/day: 1.00    Average packs/day: 1 pack/day for 53.3 years (53.3 ttl pk-yrs)    Types: Cigarettes    Start date: 09/23/1970    Passive exposure: Current   Smokeless tobacco: Never   Tobacco comments:    Ongoing since 04/2005  Vaping Use   Vaping status: Never Used  Substance and Sexual Activity   Alcohol use: No   Drug use: No   Sexual activity: Not on file  Other Topics Concern   Not on file  Social History Narrative   Married   Current Smoker   Social Drivers of Corporate investment banker Strain: Low Risk  (01/01/2024)   Overall Financial Resource Strain (CARDIA)    Difficulty of Paying Living Expenses: Not hard at all  Food Insecurity: No Food Insecurity (01/01/2024)   Hunger Vital Sign    Worried About Running Out of Food in the Last Year: Never true    Ran Out of Food in the Last Year: Never true  Transportation Needs: No Transportation Needs (01/01/2024)   PRAPARE - Administrator, Civil Service (Medical): No    Lack of Transportation (Non-Medical): No  Physical Activity: Sufficiently Active (01/01/2024)   Exercise Vital Sign    Days of Exercise per Week: 5 days    Minutes of Exercise per Session: 50 min  Stress: No Stress Concern Present (01/01/2024)   [employer] of Occupational Health - Occupational Stress Questionnaire    Feeling of Stress : Not at all  Social Connections: Socially Integrated (01/01/2024)   Social Connection and Isolation Panel [NHANES]    Frequency of Communication with Friends and Family: More than three times a week    Frequency of Social Gatherings with Friends and Family: More than three times a week    Attends Religious Services: More than 4 times per year    Active Member of [employer] or Organizations: Yes     Attends Engineer, structural: More than 4 times per year    Marital Status: Married    Tobacco Counseling Ready to quit: No Counseling given: Yes Tobacco comments: Ongoing since 04/2005    Clinical Intake:  Pre-visit preparation completed: Yes  Pain : No/denies pain     BMI - recorded: 21.79 Nutritional Status: BMI of 19-24  Normal Nutritional Risks: None Diabetes: No  Lab Results  Component Value Date   HGBA1C 6.2 12/08/2023   HGBA1C 6.4 06/02/2023   HGBA1C 6.3 11/28/2022     How often do you need to have someone help you when you read instructions, pamphlets, or other written materials from your doctor or pharmacy?: 1 - Never  Interpreter Needed?: No  Information entered by :: Hassell Halim, CMA   Activities of Daily Living     01/01/2024   11:35 AM  In your present state of health, do you have any difficulty performing the following activities:  Hearing? 0  Vision? 0  Difficulty concentrating or making decisions? 0  Walking or climbing stairs? 0  Dressing or bathing? 0  Doing errands, shopping? 0  Preparing Food and eating ? N  Using the Toilet? N  In the past six months, have you accidently leaked urine? N  Do you have problems with loss of bowel control? N  Managing your Medications? N  Managing your Finances? N  Housekeeping or managing your Housekeeping? N    Patient Care Team: Corwin Levins, MD as PCP - General (Internal Medicine) Wyline Mood Alben Spittle, MD as PCP - Cardiology (Cardiology) Mealor, Roberts Gaudy, MD as PCP - Electrophysiology (Cardiology)  Indicate any recent Medical Services you may have received from other than [facility] providers in the past year (date may be approximate).     Assessment:   This is a routine wellness examination for Jammi.  Hearing/Vision screen Hearing Screening - Comments:: Denies hearing difficulties   Vision Screening - Comments:: Wears eyeglasses for reading - referral to [facility]   Goals  Addressed  This Visit's Progress     Patient Stated (pt-stated)        Patient stated she wants to stay active (keep moving).       Depression Screen     01/01/2024   11:41 AM 12/08/2023    9:53 AM 06/02/2023   10:19 AM 12/18/2022    2:13 PM 11/28/2022   11:15 AM 11/28/2022   10:45 AM 05/30/2022    9:09 AM  PHQ 2/9 Scores  PHQ - 2 Score 0 0 0 0 0 3 0  PHQ- 9 Score 0 0 0 3  4 0    Fall Risk     01/01/2024   11:37 AM 12/08/2023    9:58 AM 06/02/2023   10:19 AM 12/18/2022    2:07 PM 11/28/2022   10:45 AM  Fall Risk   Falls in the past year? 0 1 0 0 0  Number falls in past yr: 0 0 0 0 0  Injury with Fall? 0 0 0 0 0  Risk for fall due to : No Fall Risks History of fall(s) No Fall Risks No Fall Risks No Fall Risks  Follow up Falls prevention discussed;Falls evaluation completed Falls evaluation completed Falls evaluation completed Falls prevention discussed Falls evaluation completed;Education provided    MEDICARE RISK AT HOME:  Medicare Risk at Home Any stairs in or around the home?: Yes (outside) If so, are there any without handrails?: No Home free of loose throw rugs in walkways, pet beds, electrical cords, etc?: Yes Adequate lighting in your home to reduce risk of falls?: Yes Life alert?: No Use of a cane, walker or w/c?: No Grab bars in the bathroom?: No Shower chair or bench in shower?: No Elevated toilet seat or a handicapped toilet?: Yes  TIMED UP AND GO:  Was the test performed?  No  Cognitive Function: 6CIT completed        01/01/2024   11:39 AM 12/18/2022    2:09 PM  6CIT Screen  What Year? 0 points 0 points  What month? 0 points 0 points  What time? 0 points 0 points  Count back from 20 0 points 0 points  Months in reverse 4 points 0 points  Repeat phrase 0 points 0 points  Total Score 4 points 0 points    Immunizations Immunization History  Administered Date(s) Administered   Fluad Quad(high Dose 65+) 06/02/2019, 07/05/2020, 07/06/2021,  11/28/2022   Fluad Trivalent(High Dose 65+) 06/02/2023   Influenza, High Dose Seasonal PF 05/22/2017, 06/24/2018   PFIZER(Purple Top)SARS-COV-2 Vaccination 10/28/2019, 11/18/2019, 07/05/2020   Pneumococcal Conjugate-13 06/05/2017   Pneumococcal Polysaccharide-23 12/19/2017   Tdap 04/02/2011   Zoster, Live 10/21/2011    Screening Tests Health Maintenance  Topic Date Due   Lung Cancer Screening  Never done   Zoster Vaccines- Shingrix (1 of 2) 02/15/2001   MAMMOGRAM  06/18/2019   DTaP/Tdap/Td (2 - Td or Tdap) 04/01/2021   INFLUENZA VACCINE  04/23/2024   Medicare Annual Wellness (AWV)  12/31/2024   Pneumonia Vaccine 14+ Years old  Completed   DEXA SCAN  Completed   Hepatitis C Screening  Completed   HPV VACCINES  Aged Out   Meningococcal B Vaccine  Aged Out   COVID-19 Vaccine  Discontinued   Fecal DNA (Cologuard)  Discontinued    Health Maintenance  Health Maintenance Due  Topic Date Due   Lung Cancer Screening  Never done   Zoster Vaccines- Shingrix (1 of 2) 02/15/2001   MAMMOGRAM  06/18/2019  DTaP/Tdap/Td (2 - Td or Tdap) 04/01/2021   Health Maintenance Items Addressed:01/01/24 Referral to [facility] for a routine eye exam.  Mammogram status: Ordered by PCP - pt plans to call today to schedule an appt.  Additional Screening:  Vision Screening: Recommended annual ophthalmology exams for early detection of glaucoma and other disorders of the eye.  Dental Screening: Recommended annual dental exams for proper oral hygiene  Community Resource Referral / Chronic Care Management: CRR required this visit?  No   CCM required this visit?  No     Plan:     I have personally reviewed and noted the following in the patient's chart:   Medical and social history Use of alcohol, tobacco or illicit drugs  Current medications and supplements including opioid prescriptions. Patient is not currently taking opioid prescriptions. Functional ability and status Nutritional  status Physical activity Advanced directives List of other physicians Hospitalizations, surgeries, and ER visits in previous 12 months Vitals Screenings to include cognitive, depression, and falls Referrals and appointments  In addition, I have reviewed and discussed with patient certain preventive protocols, quality metrics, and best practice recommendations. A written personalized care plan for preventive services as well as general preventive health recommendations were provided to patient.     Darreld Mclean, CMA   01/01/2024   After Visit Summary: (MyChart) Due to this being a telephonic visit, the after visit summary with patients personalized plan was offered to patient via MyChart   Notes: Nothing significant to report at this time.

## 2024-01-01 NOTE — Patient Instructions (Addendum)
 Elizabeth [name] , Thank you for taking time to come for your Medicare Wellness Visit. I appreciate your ongoing commitment to your health goals. Please review the following plan we discussed and let me know if I can assist you in the future.   Referrals/Orders/Follow-Ups/Clinician Recommendations: Aim for 30 minutes of exercise or brisk walking, 6-8 glasses of water, and 5 servings of fruits and vegetables each day.   This is a list of the screening recommended for you and due dates:  Health Maintenance  Topic Date Due   Screening for Lung Cancer  Never done   Zoster (Shingles) Vaccine (1 of 2) 02/15/2001   Mammogram  06/18/2019   DTaP/Tdap/Td vaccine (2 - Td or Tdap) 04/01/2021   Flu Shot  04/23/2024   Medicare Annual Wellness Visit  12/31/2024   Pneumonia Vaccine  Completed   DEXA scan (bone density measurement)  Completed   Hepatitis C Screening  Completed   HPV Vaccine  Aged Out   Meningitis B Vaccine  Aged Out   COVID-19 Vaccine  Discontinued   Cologuard (Stool DNA test)  Discontinued    Advanced directives: (In Chart) A copy of your advanced directives are scanned into your chart should your provider ever need it.  Next Medicare Annual Wellness Visit scheduled for next year: Yes

## 2024-01-02 ENCOUNTER — Encounter: Payer: Self-pay | Admitting: Emergency Medicine

## 2024-02-25 ENCOUNTER — Encounter: Payer: Self-pay | Admitting: Nurse Practitioner

## 2024-03-01 ENCOUNTER — Ambulatory Visit: Admitting: Internal Medicine

## 2024-04-15 ENCOUNTER — Other Ambulatory Visit: Payer: Self-pay

## 2024-04-15 MED ORDER — METOPROLOL SUCCINATE ER 25 MG PO TB24: 12.5000 mg | ORAL_TABLET | Freq: Every day | ORAL | 1 refills | Status: DC

## 2024-04-17 ENCOUNTER — Other Ambulatory Visit: Payer: Self-pay | Admitting: Internal Medicine

## 2024-06-14 ENCOUNTER — Ambulatory Visit: Admitting: Internal Medicine

## 2024-06-14 ENCOUNTER — Ambulatory Visit: Payer: Self-pay | Admitting: Internal Medicine

## 2024-06-14 ENCOUNTER — Encounter: Payer: Self-pay | Admitting: Internal Medicine

## 2024-06-14 VITALS — BP 118/76 | HR 77 | Temp 98.2°F | Ht 63.0 in | Wt 118.8 lb

## 2024-06-14 DIAGNOSIS — R739 Hyperglycemia, unspecified: Secondary | ICD-10-CM | POA: Diagnosis not present

## 2024-06-14 DIAGNOSIS — I2510 Atherosclerotic heart disease of native coronary artery without angina pectoris: Secondary | ICD-10-CM | POA: Diagnosis not present

## 2024-06-14 DIAGNOSIS — E782 Mixed hyperlipidemia: Secondary | ICD-10-CM | POA: Diagnosis not present

## 2024-06-14 DIAGNOSIS — Z23 Encounter for immunization: Secondary | ICD-10-CM

## 2024-06-14 DIAGNOSIS — Z720 Tobacco use: Secondary | ICD-10-CM | POA: Diagnosis not present

## 2024-06-14 DIAGNOSIS — Z1211 Encounter for screening for malignant neoplasm of colon: Secondary | ICD-10-CM | POA: Diagnosis not present

## 2024-06-14 DIAGNOSIS — I10 Essential (primary) hypertension: Secondary | ICD-10-CM

## 2024-06-14 DIAGNOSIS — E559 Vitamin D deficiency, unspecified: Secondary | ICD-10-CM

## 2024-06-14 DIAGNOSIS — N1831 Chronic kidney disease, stage 3a: Secondary | ICD-10-CM

## 2024-06-14 LAB — BASIC METABOLIC PANEL WITH GFR
BUN: 9 mg/dL (ref 6–23)
CO2: 27 meq/L (ref 19–32)
Calcium: 9.9 mg/dL (ref 8.4–10.5)
Chloride: 103 meq/L (ref 96–112)
Creatinine, Ser: 0.89 mg/dL (ref 0.40–1.20)
GFR: 64.36 mL/min (ref >60.00)
Glucose, Bld: 104 mg/dL — ABNORMAL HIGH (ref 70–99)
Potassium: 4.2 meq/L (ref 3.5–5.1)
Sodium: 137 meq/L (ref 135–145)

## 2024-06-14 LAB — HEPATIC FUNCTION PANEL
ALT: 9 U/L (ref 0–35)
AST: 14 U/L (ref 0–37)
Albumin: 3.9 g/dL (ref 3.5–5.2)
Alkaline Phosphatase: 78 U/L (ref 39–117)
Bilirubin, Direct: 0.1 mg/dL (ref 0.0–0.3)
Total Bilirubin: 0.5 mg/dL (ref 0.2–1.2)
Total Protein: 6.6 g/dL (ref 6.0–8.3)

## 2024-06-14 LAB — LIPID PANEL
Cholesterol: 144 mg/dL (ref 0–200)
HDL: 60 mg/dL (ref >39.00)
LDL Cholesterol: 67 mg/dL (ref 0–99)
NonHDL: 84.15
Total CHOL/HDL Ratio: 2
Triglycerides: 84 mg/dL (ref 0.0–149.0)
VLDL: 16.8 mg/dL (ref 0.0–40.0)

## 2024-06-14 LAB — HEMOGLOBIN A1C: Hgb A1c MFr Bld: 6.3 % (ref 4.6–6.5)

## 2024-06-14 LAB — VITAMIN D 25 HYDROXY (VIT D DEFICIENCY, FRACTURES): VITD: 16.79 ng/mL — ABNORMAL LOW (ref 30.00–100.00)

## 2024-06-14 MED ORDER — TELMISARTAN 80 MG PO TABS
ORAL_TABLET | ORAL | 2 refills | Status: AC
Start: 2024-06-14 — End: ?

## 2024-06-14 MED ORDER — ALBUTEROL SULFATE HFA 108 (90 BASE) MCG/ACT IN AERS: 2.0000 | INHALATION_SPRAY | Freq: Four times a day (QID) | RESPIRATORY_TRACT | 11 refills | Status: AC | PRN

## 2024-06-14 MED ORDER — NITROGLYCERIN 0.4 MG/SPRAY TL SOLN: 1.0000 | 1 refills | Status: AC | PRN

## 2024-06-14 NOTE — Patient Instructions (Signed)
 You had the flu shot today  Please continue all other medications as before, and refills have been done if requested.  Please have the pharmacy call with any other refills you may need.  Please continue your efforts at being more active, low cholesterol diet, and weight control.  Please keep your appointments with your specialists as you may have planned  You will be contacted regarding the referral for: colonoscopy  Please go to the LAB at the blood drawing area for the tests to be done  You will be contacted by phone if any changes need to be made immediately.  Otherwise, you will receive a letter about your results with an explanation, but please check with MyChart first.  Please make an Appointment to return in 6 months, or sooner if needed

## 2024-06-14 NOTE — Assessment & Plan Note (Signed)
 BP Readings from Last 3 Encounters:  06/14/24 118/76  12/08/23 120/70  12/01/23 124/84   Stable, pt to continue medical treatment toprol  xl 12.5 qd

## 2024-06-14 NOTE — Assessment & Plan Note (Signed)
 Lab Results  Component Value Date   LDLCALC 67 06/14/2024   Stable, pt to continue current statin crestor  40 every day, repatha  140 mg and zetia  10 mg

## 2024-06-14 NOTE — Assessment & Plan Note (Signed)
 Last vitamin D  Lab Results  Component Value Date   VD25OH 16.79 (L) 06/14/2024   Low, to start oral replacement

## 2024-06-14 NOTE — Assessment & Plan Note (Signed)
 Lab Results  Component Value Date   HGBA1C 6.3 06/14/2024   Stable, pt to continue current medical treatment  - diet, wt control

## 2024-06-14 NOTE — Progress Notes (Signed)
 Patient ID: Elizabeth [name], female   DOB: Sep 09, 1951, 73 y.o.   MRN: [id number]        Chief Complaint: follow up HTN, HLD and hyperglycemia, low vit d, cad       HPI:  Elizabeth [name] is a 73 y.o. female here overall doing ok. Pt denies chest pain, increased sob or doe, wheezing, orthopnea, PND, increased LE swelling, palpitations, dizziness or syncope..   Pt denies polydipsia, polyuria, or new focal neuro s/s.    Pt denies fever, wt loss, night sweats, loss of appetite, or other constitutional symptoms  Wt down 5 lbs but plans to eat more.  Plans to have tdap and shingrix at hte pharmacy.   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; groin hematoma and lower abdominal hematoma then   Degenerative joint disease    Generalized   Hyperlipidemia    Hypertension    Myocardial infarction (HCC)    x3, last MI was 6 years ago per pt/Stent placed   Osteoporosis 06/05/2017   Sickle cell trait    Stroke (HCC) 2017   Tobacco abuse April, 2011   Continued   Past Surgical History:  Procedure Laterality Date   ANGIOPLASTY  2006   x3 stents   COLONOSCOPY  05/20/2011   CORONARY ATHERECTOMY N/A 05/22/2022   Procedure: CORONARY ATHERECTOMY;  Surgeon: Verlin Lonni BIRCH, MD;  Location: [facility];  Service: Cardiovascular;  Laterality: N/A;   CORONARY STENT INTERVENTION N/A 05/22/2022   Procedure: CORONARY STENT INTERVENTION;  Surgeon: Verlin Lonni BIRCH, MD;  Location: [facility];  Service: Cardiovascular;  Laterality: N/A;   DILATION AND  CURETTAGE OF UTERUS     EP IMPLANTABLE DEVICE N/A 05/21/2016   Procedure: Loop Recorder Insertion;  Surgeon: Lynwood Rakers, MD;  Location: [facility];  Service: Cardiovascular;  Laterality: N/A;   FOOT SURGERY  2000   LEFT HEART CATH AND CORONARY ANGIOGRAPHY N/A 05/20/2022   Procedure: LEFT HEART CATH AND CORONARY ANGIOGRAPHY;  Surgeon: Verlin Lonni BIRCH, MD;  Location: [facility];  Service: Cardiovascular;  Laterality: N/A;   LITHOTRIPSY  2008   left kidney stone and also ureteral stent placed   POLYPECTOMY     TEE WITHOUT CARDIOVERSION N/A 05/21/2016   Procedure: TRANSESOPHAGEAL ECHOCARDIOGRAM (TEE);  Surgeon: Redell GORMAN Shallow, MD;  Location: [facility];  Service: Cardiovascular;  Laterality: N/A;    reports that she has been smoking cigarettes. She started smoking about 53 years ago. She has a 53.7 pack-year smoking history. She has been exposed to tobacco smoke. She has never used smokeless tobacco. She reports that she does not drink alcohol and does not use drugs. family history includes Coronary artery disease in her father and mother; Heart disease in her brother; Stroke in her mother. Allergies  Allergen Reactions   Shrimp [Shellfish Allergy] Nausea And Vomiting   Adhesive [Tape] Other (See Comments)  Unknown   Soy Allergy (Obsolete) Nausea And Vomiting   Current Outpatient Medications on File Prior to Visit  Medication Sig Dispense Refill   alendronate  (FOSAMAX ) 70 MG tablet Take 1 tablet (70 mg total) by mouth every 7 (seven) days. Take with a full glass of water on an empty stomach. 12 tablet 3   aspirin  EC 81 MG tablet Take 1 tablet (81 mg total) by mouth daily. Swallow whole. 30 tablet 12   escitalopram  (LEXAPRO ) 10 MG tablet TAKE 1 TABLET BY MOUTH EVERY DAY 90 tablet 2   ezetimibe  (ZETIA ) 10 MG tablet TAKE 1 TABLET BY MOUTH EVERY DAY 90 tablet 3   isosorbide  mononitrate (IMDUR ) 30 MG 24 hr tablet TAKE 1 TABLET BY MOUTH EVERYDAY AT BEDTIME 90 tablet 2    metoprolol  succinate (TOPROL  XL) 25 MG 24 hr tablet Take 0.5 tablets (12.5 mg total) by mouth daily. 45 tablet 1   rosuvastatin  (CRESTOR ) 40 MG tablet Take 1 tablet (40 mg total) by mouth daily. 90 tablet 3   vitamin B-12 (CYANOCOBALAMIN ) 1000 MCG tablet Take 1 tablet (1,000 mcg total) by mouth daily. 90 tablet 3   Evolocumab  (REPATHA  SURECLICK) 140 MG/ML SOAJ Inject 140 mg into the skin every 14 (fourteen) days. (Patient not taking: Reported on 06/14/2024) 6 mL 3   No current facility-administered medications on file prior to visit.        ROS:  All others reviewed and negative.  Objective        PE:  BP 118/76   Pulse 77   Temp 98.2 F (36.8 C)   Ht 5' 3 (1.6 m)   Wt 118 lb 12.8 oz (53.9 kg)   SpO2 99%   BMI 21.04 kg/m                 Constitutional: Pt appears in NAD               HENT: Head: NCAT.                Right Ear: External ear normal.                 Left Ear: External ear normal.                Eyes: . Pupils are equal, round, and reactive to light. Conjunctivae and EOM are normal               Nose: without d/c or deformity               Neck: Neck supple. Gross normal ROM               Cardiovascular: Normal rate and regular rhythm.                 Pulmonary/Chest: Effort normal and breath sounds without rales or wheezing.                Abd:  Soft, NT, ND, + BS, no organomegaly               Neurological: Pt is alert. At baseline orientation, motor grossly intact               Skin: Skin is warm. No rashes, no other new lesions, LE edema - none               Psychiatric: Pt behavior is normal without agitation   Micro: none  Cardiac tracings I have personally interpreted today:  none  Pertinent Radiological findings (summarize): none   Lab Results  Component Value Date   WBC 6.6 12/08/2023   HGB 12.5 12/08/2023   HCT 37.6 12/08/2023   PLT 136.0 (L) 12/08/2023   GLUCOSE 104 (H) 06/14/2024   CHOL 144 06/14/2024   TRIG 84.0 06/14/2024   HDL 60.00  06/14/2024   LDLDIRECT 164.8 01/17/2010   LDLCALC 67 06/14/2024   ALT 9 06/14/2024   AST 14 06/14/2024   NA 137 06/14/2024   K 4.2 06/14/2024   CL 103 06/14/2024   CREATININE 0.89 06/14/2024   BUN 9 06/14/2024   CO2 27 06/14/2024   TSH 2.35 12/08/2023   INR 0.95 05/19/2016   HGBA1C 6.3 06/14/2024   MICROALBUR 1.1 12/08/2023   Assessment/Plan:  Lalana Wachter is a 73 y.o. Black or African American [2] female with  has a past medical history of Abrasion or friction burn of foot and toe(s), with infection, Allergy, Anxiety, CAD (coronary artery disease), Degenerative joint disease, Hyperlipidemia, Hypertension, Myocardial infarction (HCC), Osteoporosis (06/05/2017), Sickle cell trait, Stroke [facility]) (2017), and Tobacco abuse (April, 2011).  Coronary artery disease involving native coronary artery of native heart without angina pectoris Stable, cont current tx  Vitamin D  deficiency Last vitamin D  Lab Results  Component Value Date   VD25OH 16.79 (L) 06/14/2024   Low, to start oral replacement   Tobacco abuse Pt counsled to quit, pt not ready  Mixed hyperlipidemia Lab Results  Component Value Date   LDLCALC 67 06/14/2024   Stable, pt to continue current statin crestor  40 every day, repatha  140 mg and zetia  10 mg   Hyperglycemia Lab Results  Component Value Date   HGBA1C 6.3 06/14/2024   Stable, pt to continue current medical treatment  - diet, wt control   CKD stage 3a, GFR 45-59 ml/min (HCC) Lab Results  Component Value Date   CREATININE 0.89 06/14/2024   Stable overall, cont to avoid nephrotoxins   Benign essential HTN BP Readings from Last 3 Encounters:  06/14/24 118/76  12/08/23 120/70  12/01/23 124/84   Stable, pt to continue medical treatment toprol  xl 12.5 qd  Followup: Return in about 6 months (around 12/12/2024).  Lynwood Rush, MD 06/14/2024 9:00 PM [facility]

## 2024-06-14 NOTE — Assessment & Plan Note (Signed)
 Lab Results  Component Value Date   CREATININE 0.89 06/14/2024   Stable overall, cont to avoid nephrotoxins

## 2024-06-14 NOTE — Assessment & Plan Note (Signed)
Stable, cont current tx 

## 2024-06-14 NOTE — Assessment & Plan Note (Signed)
 Pt counsled to quit, pt not ready

## 2024-07-27 ENCOUNTER — Other Ambulatory Visit: Payer: Self-pay

## 2024-07-27 ENCOUNTER — Other Ambulatory Visit: Payer: Self-pay | Admitting: Internal Medicine

## 2024-07-27 ENCOUNTER — Other Ambulatory Visit: Payer: Self-pay | Admitting: Nurse Practitioner

## 2024-07-27 DIAGNOSIS — F339 Major depressive disorder, recurrent, unspecified: Secondary | ICD-10-CM

## 2024-07-27 MED ORDER — METOPROLOL SUCCINATE ER 25 MG PO TB24: 12.5000 mg | ORAL_TABLET | Freq: Every day | ORAL | 0 refills | Status: AC

## 2024-07-27 NOTE — Telephone Encounter (Signed)
 Pt is requesting a refill on non cardiac medication escitalopram . Would the provider like to refill this non cardiac medication? Please address

## 2024-07-27 NOTE — Telephone Encounter (Signed)
*  STAT* If patient is at the pharmacy, call can be transferred to refill team.   1. Which medications need to be refilled? (please list name of each medication and dose if known) escitalopram  (LEXAPRO ) 10 MG tablet  metoprolol  succinate (TOPROL  XL) 25 MG 24 hr tablet   2. Would you like to learn more about the convenience, safety, & potential cost savings by using the [facility]? No   3. Are you open to using the [facility] (Type [facility].) No   4. Which pharmacy/location (including street and city if local pharmacy) is medication to be sent to?  [id number] - [place], [place] - [address]   5. Do they need a 30 day or 90 day supply? 90 day

## 2024-07-27 NOTE — Telephone Encounter (Unsigned)
 Copied from CRM #[id number]. Topic: Clinical - Medication Refill >> Jul 27, 2024  2:10 PM Mesmerise C wrote: Medication:  isosorbide  mononitrate (IMDUR ) 30 MG 24 hr tablet  ezetimibe  (ZETIA ) 10 MG tablet  Has the patient contacted their pharmacy? Yes (Agent: If no, request that the patient contact the pharmacy for the refill. If patient does not wish to contact the pharmacy document the reason why and proceed with request.) (Agent: If yes, when and what did the pharmacy advise?) They never get the refills correct This is the patient's preferred pharmacy:  [phone] [address] [place] - [address] [place] [address] Phone: [phone] Fax: [phone]  Is this the correct pharmacy for this prescription? Yes If no, delete pharmacy and type the correct one.   Has the prescription been filled recently? No  Is the patient out of the medication? Yes  Has the patient been seen for an appointment in the last year OR does the patient have an upcoming appointment? Yes  Can we respond through MyChart? No  Agent: Please be advised that Rx refills may take up to 3 business days. We ask that you follow-up with your pharmacy.

## 2024-07-30 MED ORDER — EZETIMIBE 10 MG PO TABS: 10.0000 mg | ORAL_TABLET | Freq: Every day | ORAL | 3 refills | Status: AC

## 2024-07-30 MED ORDER — ISOSORBIDE MONONITRATE ER 30 MG PO TB24: 30.0000 mg | ORAL_TABLET | Freq: Every day | ORAL | 2 refills | Status: AC

## 2024-09-02 ENCOUNTER — Other Ambulatory Visit: Payer: Self-pay | Admitting: Nurse Practitioner

## 2024-09-02 DIAGNOSIS — F339 Major depressive disorder, recurrent, unspecified: Secondary | ICD-10-CM

## 2024-09-25 ENCOUNTER — Other Ambulatory Visit: Payer: Self-pay | Admitting: Nurse Practitioner

## 2024-09-25 DIAGNOSIS — F339 Major depressive disorder, recurrent, unspecified: Secondary | ICD-10-CM

## 2024-10-19 ENCOUNTER — Telehealth: Payer: Self-pay

## 2024-10-19 NOTE — Telephone Encounter (Signed)
 Copied from CRM #[id number]. Topic: Appointments - Transfer of Care >> Oct 19, 2024 11:02 AM Antwanette L wrote: Pt is requesting to transfer FROM: Dr.James John Pt is requesting to transfer TO: Corean Ku FNP Reason for requested transfer: Dr. Norleen is retiring It is the responsibility of the team the patient would like to transfer to [place] Ku FNP) to reach out to the patient if for any reason this transfer is not acceptable.

## 2024-10-19 NOTE — Telephone Encounter (Signed)
 Copied from CRM [id number]. Topic: Clinical - Medication Refill >> Oct 19, 2024 10:38 AM Antwanette L wrote: Medication: escitalopram  (LEXAPRO ) 10 MG tablet  rosuvastatin  (CRESTOR ) 40 MG tablet  Has the patient contacted their pharmacy? Yes. The patient stated that the pharmacy informed her the prescription needs to be redone.   This is the patient's preferred pharmacy:  [phone] [address] [facility], [place] - [address] [place] [address] Phone: [phone] Fax: [phone]  Is this the correct pharmacy for this prescription? Yes    Has the prescription been filled recently? No. Escitalopram  was refilled on 08/14/23 and Rosuvastatin  was refilled on 10/08/23  Is the patient out of the medication? Yes  Has the patient been seen for an appointment in the last year OR does the patient have an upcoming appointment? Yes. Last office visit with dr. Norleen was on 06/14/24  Can we respond through MyChart? No. Patient can be reached at [phone]  Agent: Please be advised that Rx refills may take up to 3 business days. We ask that you follow-up with your pharmacy. >> Oct 19, 2024 10:51 AM Antwanette L wrote: On 08/14/23, Damien Burdock, NP with [facility], refilled the patients escitalopram  (Lexapro ) 10 mg. On 10/08/23, Dr. Ronal Ross, the patients cardiologist, refilled her rosuvastatin  (Crestor ) 40 mg. The patient states that she has never been by [facility] and saw Dr. Ross until she left she left the practice. The patient would like for Dr. Norleen to prescribe these medications.

## 2024-12-13 ENCOUNTER — Ambulatory Visit: Admitting: Internal Medicine

## 2025-01-03 ENCOUNTER — Ambulatory Visit

## 2025-02-01 ENCOUNTER — Ambulatory Visit

## 2025-04-05 ENCOUNTER — Encounter: Admitting: Family Medicine
